# Patient Record
Sex: Female | Born: 1940 | Race: White | Hispanic: No | Marital: Married | Smoking: Former smoker
Health system: Southern US, Community
[De-identification: ages and names within clinical notes are randomized; demographics above are authoritative.]

## PROBLEM LIST (undated history)

## (undated) DIAGNOSIS — E785 Hyperlipidemia, unspecified: Secondary | ICD-10-CM

## (undated) DIAGNOSIS — K219 Gastro-esophageal reflux disease without esophagitis: Secondary | ICD-10-CM

## (undated) DIAGNOSIS — Z8551 Personal history of malignant neoplasm of bladder: Secondary | ICD-10-CM

## (undated) DIAGNOSIS — M6281 Muscle weakness (generalized): Secondary | ICD-10-CM

## (undated) DIAGNOSIS — N289 Disorder of kidney and ureter, unspecified: Secondary | ICD-10-CM

## (undated) DIAGNOSIS — I509 Heart failure, unspecified: Secondary | ICD-10-CM

## (undated) DIAGNOSIS — E119 Type 2 diabetes mellitus without complications: Secondary | ICD-10-CM

## (undated) DIAGNOSIS — C689 Malignant neoplasm of urinary organ, unspecified: Secondary | ICD-10-CM

## (undated) DIAGNOSIS — M1990 Unspecified osteoarthritis, unspecified site: Secondary | ICD-10-CM

## (undated) DIAGNOSIS — IMO0001 Reserved for inherently not codable concepts without codable children: Secondary | ICD-10-CM

## (undated) DIAGNOSIS — E079 Disorder of thyroid, unspecified: Secondary | ICD-10-CM

## (undated) DIAGNOSIS — I10 Essential (primary) hypertension: Secondary | ICD-10-CM

## (undated) DIAGNOSIS — N390 Urinary tract infection, site not specified: Secondary | ICD-10-CM

## (undated) DIAGNOSIS — R0602 Shortness of breath: Secondary | ICD-10-CM

## (undated) DIAGNOSIS — C3490 Malignant neoplasm of unspecified part of unspecified bronchus or lung: Secondary | ICD-10-CM

## (undated) DIAGNOSIS — Z9289 Personal history of other medical treatment: Secondary | ICD-10-CM

## (undated) DIAGNOSIS — Z9989 Dependence on other enabling machines and devices: Secondary | ICD-10-CM

## (undated) DIAGNOSIS — G4733 Obstructive sleep apnea (adult) (pediatric): Secondary | ICD-10-CM

## (undated) DIAGNOSIS — Z87442 Personal history of urinary calculi: Secondary | ICD-10-CM

## (undated) DIAGNOSIS — D0370 Melanoma in situ of unspecified lower limb, including hip: Secondary | ICD-10-CM

## (undated) DIAGNOSIS — R011 Cardiac murmur, unspecified: Secondary | ICD-10-CM

## (undated) DIAGNOSIS — E669 Obesity, unspecified: Secondary | ICD-10-CM

## (undated) DIAGNOSIS — I4891 Unspecified atrial fibrillation: Secondary | ICD-10-CM

## (undated) DIAGNOSIS — M8580 Other specified disorders of bone density and structure, unspecified site: Secondary | ICD-10-CM

## (undated) HISTORY — PX: FRACTURE SURGERY: SHX138

## (undated) HISTORY — PX: COLONOSCOPY: SHX174

## (undated) HISTORY — DX: Other specified disorders of bone density and structure, unspecified site: M85.80

## (undated) HISTORY — PX: TUBAL LIGATION: SHX77

## (undated) HISTORY — DX: Melanoma in situ of unspecified lower limb, including hip: D03.70

## (undated) HISTORY — DX: Unspecified atrial fibrillation: I48.91

## (undated) HISTORY — PX: CATARACT EXTRACTION W/ INTRAOCULAR LENS  IMPLANT, BILATERAL: SHX1307

## (undated) HISTORY — PX: DILATION AND CURETTAGE OF UTERUS: SHX78

## (undated) HISTORY — DX: Malignant neoplasm of urinary organ, unspecified (CMS HCC): C68.9

## (undated) HISTORY — DX: Personal history of malignant neoplasm of bladder: Z85.51

## (undated) HISTORY — PX: HX NEPHRECTOMY: SHX65

## (undated) HISTORY — DX: Essential (primary) hypertension: I10

## (undated) HISTORY — PX: HX HYSTERECTOMY: SHX81

## (undated) HISTORY — DX: Reserved for inherently not codable concepts without codable children: IMO0001

## (undated) HISTORY — DX: Malignant neoplasm of unspecified part of unspecified bronchus or lung (CMS HCC): C34.90

## (undated) HISTORY — DX: Type 2 diabetes mellitus without complications (CMS HCC): E11.9

## (undated) HISTORY — PX: HX LASER EYE SURGERY: 2100001140

## (undated) HISTORY — PX: HX OTHER: 2100001105

## (undated) HISTORY — PX: HX GALL BLADDER SURGERY/CHOLE: SHX55

## (undated) HISTORY — DX: Unspecified osteoarthritis, unspecified site: M19.90

## (undated) HISTORY — PX: HX APPENDECTOMY: SHX54

## (undated) HISTORY — PX: HX FOOT SURGERY: 2100001154

## (undated) HISTORY — PX: HX TONSILLECTOMY: SHX27

## (undated) HISTORY — PX: HX HAND SURGERY: 2100001299

---
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
## 1994-07-02 ENCOUNTER — Ambulatory Visit (HOSPITAL_COMMUNITY): Payer: Self-pay

## 1995-11-03 ENCOUNTER — Other Ambulatory Visit: Payer: Self-pay

## 1999-09-27 ENCOUNTER — Encounter (FREE_STANDING_LABORATORY_FACILITY): Payer: Self-pay

## 2007-05-04 ENCOUNTER — Emergency Department (EMERGENCY_DEPARTMENT_HOSPITAL): Admission: EM | Admit: 2007-05-04 | Discharge: 2007-05-04 | Payer: Medicare Other

## 2008-01-21 ENCOUNTER — Other Ambulatory Visit: Payer: Self-pay | Admitting: UROLOGY

## 2008-03-03 ENCOUNTER — Other Ambulatory Visit: Payer: Self-pay | Admitting: UROLOGY

## 2008-10-20 HISTORY — PX: MELANOMA EXCISION: SHX5266

## 2008-11-25 ENCOUNTER — Inpatient Hospital Stay (HOSPITAL_BASED_OUTPATIENT_CLINIC_OR_DEPARTMENT_OTHER)
Admission: EM | Admit: 2008-11-25 | Discharge: 2008-11-25 | Disposition: A | Discharge status: Home / Self Care | Payer: Medicare Other

## 2009-01-01 ENCOUNTER — Inpatient Hospital Stay (HOSPITAL_BASED_OUTPATIENT_CLINIC_OR_DEPARTMENT_OTHER)
Admission: EM | Admit: 2009-01-01 | Discharge: 2009-01-01 | Disposition: A | Discharge status: Home / Self Care | Payer: Medicare Other

## 2009-03-09 ENCOUNTER — Encounter (INDEPENDENT_AMBULATORY_CARE_PROVIDER_SITE_OTHER): Payer: Self-pay | Admitting: *Deleted

## 2009-04-08 ENCOUNTER — Inpatient Hospital Stay (HOSPITAL_BASED_OUTPATIENT_CLINIC_OR_DEPARTMENT_OTHER)
Admission: EM | Admit: 2009-04-08 | Discharge: 2009-04-08 | Disposition: A | Discharge status: Home / Self Care | Payer: Medicare Other

## 2009-04-17 ENCOUNTER — Ambulatory Visit: Payer: Self-pay | Admitting: Internal Medicine

## 2009-04-30 ENCOUNTER — Ambulatory Visit: Payer: Self-pay | Admitting: Internal Medicine

## 2009-04-30 ENCOUNTER — Encounter: Payer: Self-pay | Admitting: Internal Medicine

## 2009-05-01 ENCOUNTER — Encounter: Payer: Self-pay | Admitting: Internal Medicine

## 2009-06-12 ENCOUNTER — Inpatient Hospital Stay (HOSPITAL_BASED_OUTPATIENT_CLINIC_OR_DEPARTMENT_OTHER)
Admission: EM | Admit: 2009-06-12 | Discharge: 2009-06-12 | Disposition: A | Discharge status: Home / Self Care | Payer: Medicare Other

## 2009-07-06 ENCOUNTER — Inpatient Hospital Stay (HOSPITAL_BASED_OUTPATIENT_CLINIC_OR_DEPARTMENT_OTHER)
Admission: EM | Admit: 2009-07-06 | Discharge: 2009-07-06 | Disposition: A | Discharge status: Home / Self Care | Payer: Medicare Other

## 2009-07-10 DIAGNOSIS — E1169 Type 2 diabetes mellitus with other specified complication: Secondary | ICD-10-CM

## 2009-08-15 ENCOUNTER — Other Ambulatory Visit: Payer: Self-pay

## 2009-08-24 ENCOUNTER — Inpatient Hospital Stay (HOSPITAL_BASED_OUTPATIENT_CLINIC_OR_DEPARTMENT_OTHER)
Admission: EM | Admit: 2009-08-24 | Discharge: 2009-08-24 | Disposition: A | Discharge status: Home / Self Care | Payer: Medicare Other

## 2009-10-20 HISTORY — PX: WRIST FRACTURE SURGERY: SHX121

## 2009-11-09 ENCOUNTER — Inpatient Hospital Stay (HOSPITAL_BASED_OUTPATIENT_CLINIC_OR_DEPARTMENT_OTHER)
Admission: EM | Admit: 2009-11-09 | Discharge: 2009-11-09 | Disposition: A | Discharge status: Home / Self Care | Payer: Medicare Other

## 2009-12-14 ENCOUNTER — Ambulatory Visit (HOSPITAL_COMMUNITY): Admission: RE | Admit: 2009-12-14 | Discharge: 2009-12-16 | Payer: Self-pay | Admitting: Orthopaedic Surgery

## 2010-01-18 ENCOUNTER — Encounter (INDEPENDENT_AMBULATORY_CARE_PROVIDER_SITE_OTHER): Payer: Medicare Other | Admitting: Urology

## 2010-02-13 ENCOUNTER — Inpatient Hospital Stay (HOSPITAL_BASED_OUTPATIENT_CLINIC_OR_DEPARTMENT_OTHER)
Admission: EM | Admit: 2010-02-13 | Discharge: 2010-02-13 | Disposition: A | Discharge status: Home / Self Care | Payer: Medicare Other

## 2010-06-22 ENCOUNTER — Inpatient Hospital Stay (HOSPITAL_BASED_OUTPATIENT_CLINIC_OR_DEPARTMENT_OTHER)
Admission: EM | Admit: 2010-06-22 | Discharge: 2010-06-22 | Disposition: A | Discharge status: Home / Self Care | Payer: Medicare Other

## 2010-06-30 ENCOUNTER — Inpatient Hospital Stay (HOSPITAL_BASED_OUTPATIENT_CLINIC_OR_DEPARTMENT_OTHER)
Admission: EM | Admit: 2010-06-30 | Discharge: 2010-06-30 | Disposition: A | Discharge status: Home / Self Care | Payer: Medicare Other

## 2010-07-09 ENCOUNTER — Inpatient Hospital Stay (HOSPITAL_BASED_OUTPATIENT_CLINIC_OR_DEPARTMENT_OTHER)
Admission: EM | Admit: 2010-07-09 | Discharge: 2010-07-09 | Disposition: A | Discharge status: Home / Self Care | Payer: Medicare Other

## 2010-09-23 ENCOUNTER — Emergency Department (HOSPITAL_COMMUNITY)
Admission: EM | Admit: 2010-09-23 | Discharge: 2010-09-23 | Payer: Self-pay | Source: Home / Self Care | Admitting: Emergency Medicine

## 2010-10-03 ENCOUNTER — Other Ambulatory Visit: Payer: Self-pay

## 2010-10-07 ENCOUNTER — Inpatient Hospital Stay (HOSPITAL_BASED_OUTPATIENT_CLINIC_OR_DEPARTMENT_OTHER)
Admission: EM | Admit: 2010-10-07 | Discharge: 2010-10-07 | Disposition: A | Discharge status: Home / Self Care | Payer: Medicare Other

## 2010-11-15 ENCOUNTER — Inpatient Hospital Stay (HOSPITAL_BASED_OUTPATIENT_CLINIC_OR_DEPARTMENT_OTHER)
Admission: EM | Admit: 2010-11-15 | Discharge: 2010-11-15 | Disposition: A | Discharge status: Home / Self Care | Payer: Medicare Other

## 2010-11-29 ENCOUNTER — Ambulatory Visit: Payer: Medicare Other | Attending: Urology | Admitting: Urology

## 2010-11-29 ENCOUNTER — Encounter (INDEPENDENT_AMBULATORY_CARE_PROVIDER_SITE_OTHER): Payer: Self-pay | Admitting: Urology

## 2010-11-29 VITALS — BP 132/70 | Temp 99.0°F | Wt 195.0 lb

## 2010-11-29 DIAGNOSIS — C679 Malignant neoplasm of bladder, unspecified: Secondary | ICD-10-CM | POA: Insufficient documentation

## 2010-11-29 NOTE — H&P (Signed)
Urology Outpatient History and Physical  [facility]    [facility]  [id number]  11/29/2010    CC:  Bladder cancer, evaluation for cystectomy    Subjective:  [facility] reports to urology clinic as a new patient.  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  She is s/p left nephroureterectomy performed by Dr. Bethann [facility] about 4 years ago.  He became ill and retired soon after surgery.  She underwent a cystoscopy by Dr[facility] Donzetta [facility] at [facility] on 11/14/10, and he found another tumor.  She was sent here for evaluation for cystectomy.  She states that she has a hole in her bladder that connects to her vagina.  She also has a "plastic tube" that she urinates from.  She denies dysuria, urinary frequency, or grossly bloody urine.    Past Medical History   Diagnosis Date   . Hypertension    . Ulcer disease    . Anemia    . Lung cancer      left lung, unknown type   . Arthritis    . Transitional cell carcinoma    . Diabetes mellitus        Past Surgical History   Procedure Date   . Hx hysterectomy    . Hx hand surgery      bilateral   . Hx foot surgery      right   . Hx other      left arm surgery   . Hx nephrectomy      with ureterectomy   . Hx other      left lung cancer removal       Current outpatient prescriptions   Medication Sig   . Simvastatin (ZOCOR) 40 mg Oral Tablet take 40 mg by mouth QPM.   . INSULIN LISPRO (HUMALOG SUBQ) 8 Units by Subcutaneous route Twice daily.   . enalapril (VASOTEC) 2.5 mg Oral Tablet take 2.5 mg by mouth Once a day.   Marland Kitchen NIACIN, INOSITOL NIACINATE, ORAL take  by mouth.   . Omeprazole (PRILOSEC) 40 mg Oral Capsule, Delayed Release(E.C.) take 20 mg by mouth Once a day.   . verapamil 240 mg Oral Tablet Sustained Release take 240 mg by mouth Once a day.   . sucralfate (CARAFATE) 1 gram Oral Tablet take 1 g by mouth Four times a day - before meals and bedtime.    . citalopram (CELEXA) 40 mg Oral Tablet take 40 mg by mouth Once a day.   . Hydrocodone-Acetaminophen (LORTAB) 2.5-500 mg Oral Tablet tablet take 1 Tab by mouth Every 6 hours as needed.   . potassium chloride (KLOR-CON) 20 mEq Oral Packet take 20 mEq by mouth Daily with Breakfast.   . OXYBUTYNIN CHLORIDE (DITROPAN XL ORAL) take 20 mg by mouth Once a day.   . INSULIN GLARGINE,HUM.REC.ANLOG (LANTUS SUBQ) 30 Units by Subcutaneous route Once a day.       No Known Allergies    History   Social History   . Marital Status: Married     Spouse Name: N/A     Number of Children: N/A   . Years of Education: N/A   Occupational History   . Homemaker    Social History Main Topics   . Smoking status: Former Games developer   . Smokeless tobacco: Not on file    Comment: Pt quit smoking over 40 years   . Alcohol Use: Yes  wine rarely   . Drug Use: Not on file   . Sexually Active: Not on file   Other Topics Concern   . Not on file   Social History Narrative   . No narrative on file       Family History   Problem Relation Age of Onset   . Diabetes Mother    . Diabetes Father    . Hypertension Mother    . Hypertension Father    . Coronary Artery Disease Mother    . Lung Cancer Sister      smoker       Review of Systems:  Urologic ROS per HPI.  Positive for reflux; arthritis pain of hands, left wrist, right foot, left hip; back pain.  10-point ROS otherwise negative.    Objective:  BP 132/70   Temp(Src) 37.2 C (99 F) (Tympanic)   Wt 88.451 kg (195 lb)    General:  NAD, appears stated age.  HEENT:  NC/AT.  No scleral icterus.  Neck:  Supple.  Heart:  RRR without murmur, rub, or gallop.  Lungs:  CTAB without crackles, wheezes, rales, or rhonchi.  Abdomen:  Soft, nontender, nondistended, normoactive bowel sounds.  Skin:  Warm and dry.  Neurologic:  Alert and oriented x3.  Psychiatric:  Normal speech and thought content.    Patient did not provide a urine sample today.  Outside records reviewed with [facility].     Pathology report from [facility] of deep tissue biopsy on 11/14/10 shows high grade urothelial carcinoma, invasive.  Muscularis propria uninvolved.  Angiolymphatic invasion present.  Left and right wall biopsies show chronic inflammation.    Assessment/Plan:  70 y.o. female with h/o TCC s/p nephroureterectomy presents with recurrent bladder cancer.  We discussed the need for radical cystectomy with urinary diversion.  The procedure was explained to the patient, and she is willing to proceed but will require surgical clearance.  We advised her to follow up with her PCP, [facility], for this.  She will be scheduled for surgery in approximately 4 weeks.        Christy Deeds, MD  Resident  [place] [facility]  Operated by [facility]    Patient seen jointly with [facility], who formulated the above management plan.

## 2010-12-03 NOTE — Progress Notes (Signed)
I discussed with the patient the need for radical cystectomy for her bladder cancer. I told her that surgery represent the best chance for his cure. I discussed the role of neoadjuvant chemotherapy in the treatment of muscle invasive bladder cancer. I told her the alternative treatment is chemo/radiation. I discussed the benefits and the risk of each approaches. I discussed the different type urinary diversion. I told her about continent and non continent diversion. Orthotopic neobladder was offered to the patient. The advantages and the disadvantages of each type of diversion were addressed. The pre and post surgical course were described to the patient.   25 min were spent with the patient out of the 45 min visit.

## 2010-12-20 ENCOUNTER — Encounter (HOSPITAL_COMMUNITY): Payer: Self-pay

## 2010-12-20 ENCOUNTER — Ambulatory Visit
Admission: RE | Admit: 2010-12-20 | Discharge: 2010-12-20 | Disposition: A | Discharge status: Home / Self Care | Payer: Medicare Other | Source: Ambulatory Visit | Attending: Urology | Admitting: Urology

## 2010-12-20 ENCOUNTER — Ambulatory Visit (INDEPENDENT_AMBULATORY_CARE_PROVIDER_SITE_OTHER): Payer: Medicare Other | Admitting: Urology

## 2010-12-20 ENCOUNTER — Ambulatory Visit (HOSPITAL_BASED_OUTPATIENT_CLINIC_OR_DEPARTMENT_OTHER)
Admission: RE | Admit: 2010-12-20 | Discharge: 2010-12-20 | Disposition: A | Discharge status: Home / Self Care | Payer: Medicare Other | Source: Ambulatory Visit | Attending: Urology | Admitting: Urology

## 2010-12-20 DIAGNOSIS — D494 Neoplasm of unspecified behavior of bladder: Secondary | ICD-10-CM | POA: Insufficient documentation

## 2010-12-20 DIAGNOSIS — Z01818 Encounter for other preprocedural examination: Secondary | ICD-10-CM | POA: Insufficient documentation

## 2010-12-20 HISTORY — DX: Personal history of other medical treatment: Z92.89

## 2010-12-20 HISTORY — DX: Hyperlipidemia, unspecified: E78.5

## 2010-12-20 HISTORY — DX: Gastro-esophageal reflux disease without esophagitis: K21.9

## 2010-12-20 HISTORY — DX: Disorder of kidney and ureter, unspecified: N28.9

## 2010-12-20 LAB — CBC
HCT: 36.1 % (ref 33.5–45.2)
HGB: 12.1 g/dL (ref 11.5–15.2)
MCH: 29 pg (ref 27.4–33.0)
MCHC: 33.4 g/dL (ref 31.6–35.5)
MCV: 86.7 fL (ref 82.0–99.0)
MPV: 7.3 FL — ABNORMAL LOW (ref 7.4–10.4)
PLATELET COUNT: 272 THOU/uL (ref 140–450)
RBC: 4.17 MIL/uL (ref 3.84–5.04)
RDW: 12.7 % (ref 10.2–14.0)
WBC: 10.5 THOU/uL (ref 3.5–11.0)

## 2010-12-20 LAB — CREATININE WITH EGFR: ESTIMATED GLOMERULAR FILTRATION RATE: 41 ml/min/1.73m2 — ABNORMAL LOW (ref >59)

## 2010-12-20 LAB — PT/INR
INR: 0.9 (ref 0.8–1.2)
PROTHROMBIN TIME: 10.1 s (ref 9.1–11.5)

## 2010-12-20 LAB — ELECTROLYTES
ANION GAP: 7 mmol/L (ref 5–16)
CARBON DIOXIDE: 27 mmol/L (ref 22–32)
CHLORIDE: 104 mmol/L (ref 96–111)
POTASSIUM: 4.7 mmol/L (ref 3.5–5.1)
SODIUM: 138 mmol/L (ref 136–145)

## 2010-12-20 LAB — PERFORM POC WHOLE BLOOD GLUCOSE: GLUCOSE, POINT OF CARE: 132 mg/dL — ABNORMAL HIGH (ref 70–105)

## 2010-12-20 LAB — BUN
BUN/CREAT RATIO: 23 — ABNORMAL HIGH (ref 6–22)
BUN: 29 mg/dL — ABNORMAL HIGH (ref 6–20)

## 2010-12-20 LAB — PTT (PARTIAL THROMBOPLASTIN TIME): APTT: 23.4 s (ref 22.0–32.0)

## 2010-12-20 MED ORDER — PEG-ELECTROLYTE ORAL SOLUTION
2.0000 L | Freq: Once | ORAL | Status: AC
Start: 2010-12-20 — End: 2010-12-20

## 2010-12-20 NOTE — H&P (Signed)
UROLOGY CLINIC  HISTORY & PHYSICAL    Name: [facility]  MRN: [id number]  Date of Birth: 07-18-1941  Date of Consultation: 12/20/2010    Chief Complaint: Chief Complaint   Patient presents with   . F/U     consent and orders         HPI: [facility] is a 70 y.o. 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  S/p left nephroureterectomy performed by Dr. Bethann Punches about 4 years ago.  Dr. Bethann Punches became ill and retired soon after surgery.  She underwent a cystoscopy by Dr. Donzetta Kohut at [facility] on 11/14/10, and he found another bladder tumor.  Pathology from that procedure revealed muscle invasive bladder cancer.  She has met with Dr. Marzetta Board several times to discuss treatment options.  She returns today for C&O and PAT for cystectomy scheduled on 12/31/2010.     Pathology report from deep tissue biopsy on 11/14/10 shows high grade urothelial carcinoma, invasive. Muscularis propria uninvolved. Angiolymphatic invasion present. Left and right wall biopsies show chronic inflammation.    PMHx: Past Medical History   Diagnosis Date   . Hypertension    . Ulcer disease    . Anemia    . Lung cancer      left lung, unknown type   . Arthritis    . Transitional cell carcinoma    . Diabetes mellitus          PSHx: Past Surgical History   Procedure Date   . Hx hysterectomy    . Hx hand surgery      bilateral   . Hx foot surgery      right   . Hx other      left arm surgery   . Hx nephrectomy      with ureterectomy   . Hx other      left lung cancer removal         Family Hx: Denies family history of bladder, or kidney cancer.     Social Hx: History   Social History   . Marital Status: Married     Spouse Name: N/A     Number of Children: N/A   . Years of Education: N/A   Occupational History   . Homemaker    Social History Main Topics   . Smoking status: Former Games developer   . Smokeless tobacco: Not on file    Comment: Pt quit smoking over 40 years   . Alcohol Use: Yes       wine rarely   . Drug Use: Not on file   . Sexually Active: Not on file   Other Topics Concern   . Not on file   Social History Narrative   . No narrative on file         Medications: Current outpatient prescriptions   Medication Sig   . PEG-Electrolyte Soln (GOLYTELY) Oral Recon Soln take 2,000 mL by mouth Once for 1 dose. At noon the day before surgery. Only fluids afterwards.   . Simvastatin (ZOCOR) 40 mg Oral Tablet take 40 mg by mouth QPM.   . INSULIN LISPRO (HUMALOG SUBQ) 8 Units by Subcutaneous route Twice daily.   . enalapril (VASOTEC) 2.5 mg Oral Tablet take 2.5 mg by mouth Once a day.   Marland Kitchen NIACIN, INOSITOL NIACINATE, ORAL take  by mouth.   . Omeprazole (PRILOSEC) 40 mg Oral Capsule, Delayed Release(E.C.) take 20 mg by mouth Once  a day.   . verapamil 240 mg Oral Tablet Sustained Release take 240 mg by mouth Once a day.   . sucralfate (CARAFATE) 1 gram Oral Tablet take 1 g by mouth Four times a day - before meals and bedtime.   . citalopram (CELEXA) 40 mg Oral Tablet take 40 mg by mouth Once a day.   . Hydrocodone-Acetaminophen (LORTAB) 2.5-500 mg Oral Tablet tablet take 1 Tab by mouth Every 6 hours as needed.   . potassium chloride (KLOR-CON) 20 mEq Oral Packet take 20 mEq by mouth Daily with Breakfast.   . OXYBUTYNIN CHLORIDE (DITROPAN XL ORAL) take 20 mg by mouth Once a day.   . INSULIN GLARGINE,HUM.REC.ANLOG (LANTUS SUBQ) 30 Units by Subcutaneous route Once a day.         Allergies: No Known Allergies      ROS:  All 10 systems were reviewed. Positive for GU symptoms listed in HPI.  Denies fever/chills, chest pain, SOB, N/V/D.     Physical Exam:  BP 100/70   Temp 36.3 C (97.4 F)   Ht 1.575 m (5\' 2" )   Wt 88.905 kg (196 lb)   BMI 35.85 kg/m2  General - alert/oriented; NAD  Lungs - non labored respirations  Abdomen - soft, NT/ND; no CVAT  Musculoskeletal - ambulates without assistance  Neurological - CN II-XII Grossly intact  GU system - deferred    Urine Dip Results:  Urine Dip Results:                 Assessment:  70 y.o. female with 1. Muscle invasive bladder cancer  2. H/o TCC of left renal pelvis    Orders Placed This Encounter   . XR CHEST PA AND LATERAL   . BUN   . CBC   . CREATININE   . ELECTROLYTES   . PT/INR   . ROUTINE PTT   . ECG 12-LEAD (PERFORMED IN PREADMISSION UNIT ONLY)   . PEG-Electrolyte Soln (GOLYTELY) Oral Recon Soln       Plan:  Consent obtained today for cystectomy with ileal conduit on 12/31/2010.  All of the patient's questions were answered.  All the risks and benefits of the procedure were discussed in detail with the patient, and she signed the consent in the office today.      The patient understands the risks of the procedure, which would include bleeding, infection/sepsis, recurrence, pain, urine leak, damage to the surrounding structures, failure to correct pathology, loss of sensation, heart attack, stroke, DVT, PE, and death.     The patient was advised to stop all NSAIDs and aspirin for at least 7-10 days and stop Plavix and Coumadin for 5-7 days respectively.    Patient was given a prescription with instructions for her bowel prep 1 day prior to OR: Golytely 4L between 1-4pm, Neomycin 1g PO @ 2pm 4pm 6pm, Erythromycin 1g PO @ 2pm 4pm 6pm, and Fleet enemas 2 PR @ 7pm.     Patient will undergo PAT today.    If the patient or their family has any questions, they can call our office prior to their procedure.      Christy Dickinson, PA-C  Physician Assistant -Certified  Division of Urology  [facility] Department of Surgery

## 2010-12-20 NOTE — Anesthesia Preprocedure Evaluation (Addendum)
Airway       Mallampati: III    TM distance: >3 FB    Neck ROM: full  Mouth Opening: fair.  No endotracheal tube present  No Tracheostomy present  Dental           (+) edentulous           Pulmonary    Breath sounds clear to auscultation  (-) no rhonchi, no decreased breath sounds, no wheezes, no rales and no stridor   Cardiovascular    Rhythm: regular  Rate: Normal  (-) no friction rub, carotid bruit is not present, no peripheral edema and no murmur     Other findings          Anesthesia type: general    ASA 3        Patient's NPO status is appropriate for Anesthesia.      Anesthetic plan and risks discussed with patient.    Anesthesia issues/risks discussed are: Post-op Intubation/Ventilation, Post-op Pain Management, Art Line Placement, Central Line Placement, Intraoperative Awareness/ Recall, PONV, Cardiac Events/MI and Stroke.      Use of blood products discussed with patient.     Possibility of needing postop ventilation and ICU discussed with patient.  AQA.    The Anesthesia Plan above was formulated based on the history, results, and physical exam performed immediately prior to the procedure/surgery.        EKG Ordered:  12/20/2010   CXR Ordered:  12/20/2010   Other Studies: labs ordered 12/20/10  Consults: None    Patient instructed to take omeprazole, citalapram, verapamil, hydrocodone, oxybutynin. Hold enalapril for 24 hours prior to surgery. Instructed to take 1/2 dose of lantus the night prior to surgery and only cover fs above 200 the morning of surgery with humalog. Diabetic protocol given to pt

## 2010-12-23 ENCOUNTER — Other Ambulatory Visit (INDEPENDENT_AMBULATORY_CARE_PROVIDER_SITE_OTHER): Payer: Self-pay | Admitting: Urology

## 2010-12-23 MED ORDER — PEG-ELECTROLYTE ORAL SOLUTION
2.0000 L | Freq: Once | ORAL | Status: AC
Start: 2010-12-23 — End: 2010-12-23

## 2010-12-23 NOTE — Progress Notes (Signed)
I discussed with the patient the need for radical cystectomy for his bladder cancer. I told him that surgery represent the best chance for his cure. I discussed the role of neoadjuvant chemotherapy in the treatment of muscle invasive bladder cancer. I told him the alternative treatment is chemo/radiation. I discussed the benefits and the risk of each approaches. I discussed the different type urinary diversion. I told him about continent and non continent diversion. Orthotopic neobladder was offered to the patient. The advantages and the disadvantages of each type of diversion were addressed. The pre and post surgical course were described to the patient. I discussed  the potential complications, like blood loss, infection, incontinence, MI, PE, DVT, Blindness, complication due to positioning,  failure to remove the prostate, MI and Stroke.  25 min min were spent with the patient out of the 45 min visit

## 2010-12-23 NOTE — Progress Notes (Signed)
 Name: [facility]  MRN: [id number]  Date of Birth: 01/25/1941  Date of Consultation: 12/23/2010    Patient called to report she lost her Golytely  prescription.  E-prescribed her another prescription to [facility] pharmacy.    Orders Placed This Encounter   . PEG-Electrolyte Soln (GOLYTELY ) Oral Recon Soln       Kamare Caspers McClure Deryn Massengale, PA-C 12/23/2010, 1:25 PM

## 2010-12-30 MED ORDER — SODIUM CHLORIDE 0.9 % INTRAVENOUS SOLUTION
2.00 g | Freq: Once | INTRAVENOUS | Status: DC
Start: 2010-12-31 — End: 2010-12-31
  Filled 2010-12-30: qty 20

## 2010-12-30 MED ORDER — CEFAZOLIN 10 GRAM SOLUTION FOR INJECTION
2.00 g | Freq: Once | INTRAMUSCULAR | Status: DC
Start: 2010-12-31 — End: 2010-12-31
  Filled 2010-12-30: qty 20

## 2010-12-31 ENCOUNTER — Inpatient Hospital Stay (HOSPITAL_COMMUNITY): Payer: Medicare Other

## 2010-12-31 ENCOUNTER — Encounter (HOSPITAL_COMMUNITY)
Admission: RE | Disposition: A | Discharge status: Nursing Home (Includes ICF) | Payer: Self-pay | Source: Ambulatory Visit | Attending: Urology

## 2010-12-31 ENCOUNTER — Inpatient Hospital Stay
Admission: RE | Admit: 2010-12-31 | Discharge: 2011-01-07 | DRG: 653 | Disposition: A | Discharge status: Skilled Nursing Facility | Payer: Medicare Other | Source: Ambulatory Visit | Attending: Urology | Admitting: Urology

## 2010-12-31 ENCOUNTER — Encounter (HOSPITAL_COMMUNITY): Payer: Self-pay | Admitting: Family

## 2010-12-31 ENCOUNTER — Inpatient Hospital Stay (HOSPITAL_COMMUNITY): Payer: Medicare Other | Admitting: Urology

## 2010-12-31 ENCOUNTER — Inpatient Hospital Stay (HOSPITAL_COMMUNITY): Payer: Medicare Other | Admitting: Anesthesiology-BA

## 2010-12-31 ENCOUNTER — Encounter (HOSPITAL_COMMUNITY): Payer: Self-pay

## 2010-12-31 DIAGNOSIS — Z85118 Personal history of other malignant neoplasm of bronchus and lung: Secondary | ICD-10-CM

## 2010-12-31 DIAGNOSIS — D649 Anemia, unspecified: Secondary | ICD-10-CM | POA: Diagnosis present

## 2010-12-31 DIAGNOSIS — E785 Hyperlipidemia, unspecified: Secondary | ICD-10-CM | POA: Diagnosis present

## 2010-12-31 DIAGNOSIS — N1330 Unspecified hydronephrosis: Secondary | ICD-10-CM | POA: Diagnosis not present

## 2010-12-31 DIAGNOSIS — K277 Chronic peptic ulcer, site unspecified, without hemorrhage or perforation: Secondary | ICD-10-CM | POA: Diagnosis present

## 2010-12-31 DIAGNOSIS — E876 Hypokalemia: Secondary | ICD-10-CM | POA: Diagnosis not present

## 2010-12-31 DIAGNOSIS — Z452 Encounter for adjustment and management of vascular access device: Secondary | ICD-10-CM

## 2010-12-31 DIAGNOSIS — R34 Anuria and oliguria: Secondary | ICD-10-CM | POA: Diagnosis not present

## 2010-12-31 DIAGNOSIS — IMO0002 Reserved for concepts with insufficient information to code with codable children: Secondary | ICD-10-CM | POA: Diagnosis not present

## 2010-12-31 DIAGNOSIS — E872 Acidosis, unspecified: Secondary | ICD-10-CM | POA: Diagnosis not present

## 2010-12-31 DIAGNOSIS — M129 Arthropathy, unspecified: Secondary | ICD-10-CM | POA: Diagnosis present

## 2010-12-31 DIAGNOSIS — J95821 Acute postprocedural respiratory failure: Secondary | ICD-10-CM | POA: Diagnosis not present

## 2010-12-31 DIAGNOSIS — Z794 Long term (current) use of insulin: Secondary | ICD-10-CM

## 2010-12-31 DIAGNOSIS — Z905 Acquired absence of kidney: Secondary | ICD-10-CM

## 2010-12-31 DIAGNOSIS — I10 Essential (primary) hypertension: Secondary | ICD-10-CM | POA: Diagnosis present

## 2010-12-31 DIAGNOSIS — K219 Gastro-esophageal reflux disease without esophagitis: Secondary | ICD-10-CM | POA: Diagnosis present

## 2010-12-31 DIAGNOSIS — C679 Malignant neoplasm of bladder, unspecified: Principal | ICD-10-CM | POA: Diagnosis present

## 2010-12-31 DIAGNOSIS — Z8553 Personal history of malignant neoplasm of renal pelvis: Secondary | ICD-10-CM

## 2010-12-31 DIAGNOSIS — I9589 Other hypotension: Secondary | ICD-10-CM | POA: Diagnosis not present

## 2010-12-31 DIAGNOSIS — N170 Acute kidney failure with tubular necrosis: Secondary | ICD-10-CM | POA: Diagnosis not present

## 2010-12-31 DIAGNOSIS — Z87891 Personal history of nicotine dependence: Secondary | ICD-10-CM

## 2010-12-31 DIAGNOSIS — E861 Hypovolemia: Secondary | ICD-10-CM | POA: Diagnosis not present

## 2010-12-31 DIAGNOSIS — E119 Type 2 diabetes mellitus without complications: Secondary | ICD-10-CM | POA: Diagnosis present

## 2010-12-31 HISTORY — PX: HX CYSTECTOMY: SHX74

## 2010-12-31 LAB — BASIC METABOLIC PANEL
ANION GAP: 8 mmol/L (ref 5–16)
ANION GAP: 11 mmol/L (ref 5–16)
BUN/CREAT RATIO: 16 (ref 6–22)
BUN/CREAT RATIO: 14 (ref 6–22)
BUN: 23 mg/dL — ABNORMAL HIGH (ref 6–20)
BUN: 24 mg/dL — ABNORMAL HIGH (ref 6–20)
CALCIUM: 8.8 mg/dL (ref 8.5–10.4)
CALCIUM: 8.1 mg/dL — ABNORMAL LOW (ref 8.5–10.4)
CARBON DIOXIDE: 16 mmol/L — ABNORMAL LOW (ref 22–32)
CHLORIDE: 115 mmol/L — ABNORMAL HIGH (ref 96–111)
CHLORIDE: 111 mmol/L (ref 96–111)
CREATININE: 1.4 mg/dL — ABNORMAL HIGH (ref 0.49–1.10)
CREATININE: 1.66 mg/dL — ABNORMAL HIGH (ref 0.49–1.10)
ESTIMATED GLOMERULAR FILTRATION RATE: 37 ml/min/1.73m2 — ABNORMAL LOW (ref >59)
ESTIMATED GLOMERULAR FILTRATION RATE: 30 ml/min/1.73m2 — ABNORMAL LOW (ref >59)
GLUCOSE,NONFAST: 126 mg/dL (ref 65–139)
GLUCOSE,NONFAST: 150 mg/dL — ABNORMAL HIGH (ref 65–139)
POTASSIUM: 4.4 mmol/L (ref 3.5–5.1)
SODIUM: 139 mmol/L (ref 136–145)

## 2010-12-31 LAB — ARTERIAL BLOOD GAS/LACTATE/CO-OX/LYTES (NA/K/CA/CL/GLUC) (TEMP COMP) - ORS ONLY
%FIO2: 100 % (ref 21–100)
%FIO2: 60 % (ref 21–100)
%FIO2: 68 % (ref 21–100)
%FIO2: 68 % (ref 21–100)
%FIO2: 68 % (ref 21–100)
BASE DEFICIT: 5.1 mmol/L — ABNORMAL HIGH (ref 0.0–3.0)
BASE DEFICIT: 8.1 mmol/L — ABNORMAL HIGH (ref 0.0–3.0)
BASE DEFICIT: 9.3 mmol/L — ABNORMAL HIGH (ref 0.0–3.0)
BASE DEFICIT: 8.9 mmol/L — ABNORMAL HIGH (ref 0.0–3.0)
BICARBONATE: 21 mmol/L (ref 18.0–29.0)
BICARBONATE: 18.7 mmol/L (ref 18.0–29.0)
BICARBONATE: 17.7 mmol/L — ABNORMAL LOW (ref 18.0–29.0)
BICARBONATE: 18 mmol/L (ref 18.0–29.0)
BICARBONATE: 18.3 mmol/L (ref 18.0–29.0)
CARBOXYHEMOGLOBIN: 0 % (ref 0.0–2.5)
CARBOXYHEMOGLOBIN: 0 % (ref 0.0–2.5)
CARBOXYHEMOGLOBIN: 0.2 % (ref 0.0–2.5)
CARBOXYHEMOGLOBIN: 1.1 % (ref 0.0–2.5)
CARBOXYHEMOGLOBIN: 1.3 % (ref 0.0–2.5)
CHLORIDE: 109 mmol/L (ref 96–111)
CHLORIDE: 110 mmol/L (ref 96–111)
CHLORIDE: 111 mmol/L (ref 96–111)
CO2T: 40 mmHg (ref 33.1–43.1)
CO2T: 32 mmHg — ABNORMAL LOW (ref 33.1–43.1)
CO2T: 33 mmHg — ABNORMAL LOW (ref 33.1–43.1)
CO2T: 35 mmHg (ref 33.1–43.1)
GLUCOSE: 226 mg/dL — ABNORMAL HIGH (ref 70–105)
GLUCOSE: 205 mg/dL — ABNORMAL HIGH (ref 70–105)
GLUCOSE: 176 mg/dL — ABNORMAL HIGH (ref 70–105)
GLUCOSE: 159 mg/dL — ABNORMAL HIGH (ref 70–105)
HEMATOCRIT: 29 % — ABNORMAL LOW (ref 33.5–45.2)
HEMATOCRIT: 29 % — ABNORMAL LOW (ref 33.5–45.2)
HEMATOCRIT: 27 % — ABNORMAL LOW (ref 33.5–45.2)
HEMATOCRIT: 29 % — ABNORMAL LOW (ref 33.5–45.2)
HEMOGLOBIN: 9.5 g/dL — ABNORMAL LOW (ref 12.0–16.0)
HEMOGLOBIN: 9.6 g/dL — ABNORMAL LOW (ref 12.0–16.0)
HEMOGLOBIN: 9.1 g/dL — ABNORMAL LOW (ref 12.0–16.0)
HEMOGLOBIN: 9 g/dL — ABNORMAL LOW (ref 12.0–16.0)
HEMOGLOBIN: 9.6 g/dL — ABNORMAL LOW (ref 12.0–16.0)
IONIZED CALCIUM: 1.12 mmol/L — ABNORMAL LOW (ref 1.30–1.46)
IONIZED CALCIUM: 1.34 mmol/L (ref 1.30–1.46)
IONIZED CALCIUM: 1.2 mmol/L — ABNORMAL LOW (ref 1.30–1.46)
IONIZED CALCIUM: 1.27 mmol/L — ABNORMAL LOW (ref 1.30–1.46)
IONIZED CALCIUM: 1.4 mmol/L (ref 1.30–1.46)
LACTATE: 0.9 mmol/L (ref <1.3)
LACTATE: 2 mmol/L — ABNORMAL HIGH (ref <1.3)
LACTATE: 1.4 mmol/L — ABNORMAL HIGH (ref <1.3)
LACTATE: 1.6 mmol/L — ABNORMAL HIGH (ref <1.3)
LACTATE: 1.9 mmol/L — ABNORMAL HIGH (ref <1.3)
MET-HEMOGLOBIN: 0.5 % (ref 0.0–3.0)
MET-HEMOGLOBIN: 0.3 % (ref 0.0–3.0)
MET-HEMOGLOBIN: 0.9 % (ref 0.0–3.0)
MET-HEMOGLOBIN: 1.3 % (ref 0.0–3.0)
O2CT: 13.6 — ABNORMAL LOW (ref 15.7–21.6)
O2CT: 13.9 — ABNORMAL LOW (ref 15.7–21.6)
O2CT: 13.3 — ABNORMAL LOW (ref 15.7–21.6)
O2CT: 13.1 — ABNORMAL LOW (ref 15.7–21.6)
O2CT: 13.8 — ABNORMAL LOW (ref 15.7–21.6)
OXYHEMOGLOBIN: 97.2 % (ref 85.0–98.0)
OXYHEMOGLOBIN: 98.4 % — ABNORMAL HIGH (ref 85.0–98.0)
OXYHEMOGLOBIN: 98.3 % — ABNORMAL HIGH (ref 85.0–98.0)
OXYHEMOGLOBIN: 97.3 % (ref 85.0–98.0)
OXYHEMOGLOBIN: 96.6 % (ref 85.0–98.0)
PCO2: 40 mmHg (ref 33.1–43.1)
PCO2: 41 mmHg (ref 33.1–43.1)
PCO2: 33 mmHg — ABNORMAL LOW (ref 33.1–43.1)
PCO2: 34 mmHg (ref 33.1–43.1)
PCO2: 36 mmHg (ref 33.1–43.1)
PH: 7.32 — ABNORMAL LOW (ref 7.350–7.450)
PH: 7.26 — ABNORMAL LOW (ref 7.350–7.450)
PH: 7.3 — ABNORMAL LOW (ref 7.350–7.450)
PH: 7.3 — ABNORMAL LOW (ref 7.350–7.450)
PH: 7.29 — ABNORMAL LOW (ref 7.350–7.450)
PHT: 7.33 — CL (ref 7.350–7.450)
PHT: 7.27 — CL (ref 7.350–7.450)
PHT: 7.31 — CL (ref 7.350–7.450)
PHT: 7.31 — CL (ref 7.350–7.450)
PHT: 7.3 — CL (ref 7.350–7.450)
PIO2/FIO2 RATIO: 257 — ABNORMAL LOW (ref >300)
PIO2/FIO2 RATIO: 405 (ref >300)
PIO2/FIO2 RATIO: 425 (ref >300)
PIO2/FIO2 RATIO: 441 (ref >300)
PIO2/FIO2 RATIO: 416 (ref >300)
PO2: 257 mmHg — ABNORMAL HIGH (ref 72–100)
PO2: 243 mmHg — ABNORMAL HIGH (ref 72–100)
PO2: 289 mmHg — ABNORMAL HIGH (ref 72–100)
PO2: 300 mmHg — ABNORMAL HIGH (ref 72–100)
PO2: 283 mmHg — ABNORMAL HIGH (ref 72–100)
PO2T: 252 mmHg (ref 72–100)
PO2T: 240 mmHg (ref 72–100)
PO2T: 287 mmHg (ref 72–100)
PO2T: 297 mmHg (ref 72–100)
PO2T: 281 mmHg (ref 72–100)
SODIUM: 137 mmol/L (ref 136–145)
SODIUM: 135 mmol/L — ABNORMAL LOW (ref 136–145)
SODIUM: 137 mmol/L (ref 136–145)
TEMPERATURE, COMP: 36 C (ref 15.0–40.0)
TEMPERATURE, COMP: 36.4 C (ref 15.0–40.0)
TEMPERATURE, COMP: 36.5 C (ref 15.0–40.0)
TEMPERATURE, COMP: 36.4 C (ref 15.0–40.0)
TEMPERATURE, COMP: 36.5 C (ref 15.0–40.0)
WHOLE BLOOD K+: 3.8 mmol/L (ref 3.5–5.0)
WHOLE BLOOD K+: 4.7 mmol/L (ref 3.5–5.0)
WHOLE BLOOD K+: 4 mmol/L (ref 3.5–5.0)
WHOLE BLOOD K+: 4 mmol/L (ref 3.5–5.0)
WHOLE BLOOD K+: 4.1 mmol/L (ref 3.5–5.0)

## 2010-12-31 LAB — ARTERIAL BLOOD GAS/K/CA/CO-OX
%FIO2: 50 % (ref 21–100)
BASE DEFICIT: 10.5 mmol/L — ABNORMAL HIGH (ref 0.0–3.0)
BICARBONATE: 16.8 mmol/L — ABNORMAL LOW (ref 18.0–29.0)
HEMATOCRIT: 29 % — ABNORMAL LOW (ref 33.5–45.2)
HEMOGLOBIN: 9.6 g/dL — ABNORMAL LOW (ref 12.0–16.0)
IONIZED CALCIUM: 1.3 mmol/L (ref 1.30–1.46)
MET-HEMOGLOBIN: 0.3 % (ref 0.0–3.0)
O2CT: 13.8 — ABNORMAL LOW (ref 15.7–21.6)
OXYHEMOGLOBIN: 98 % (ref 85.0–98.0)
PCO2: 30 mm Hg — ABNORMAL LOW (ref 33.1–43.1)
PH: 7.3 — ABNORMAL LOW (ref 7.350–7.450)
PIO2/FIO2 RATIO: 432 (ref >300)
PO2: 216 mm Hg — ABNORMAL HIGH (ref 72–100)
WHOLE BLOOD K+: 4.3 mmol/L (ref 3.5–5.0)

## 2010-12-31 LAB — ARTERIAL BLOOD GAS/LACTATE/CO-OX/LYTES (NA/K/CA/CL/GLUC) - ORS ONLY
%FIO2: 30 % (ref 21–100)
%FIO2: 30 % (ref 21–100)
BASE DEFICIT: 6.4 mmol/L — ABNORMAL HIGH (ref 0.0–3.0)
BASE DEFICIT: 7.8 mmol/L — ABNORMAL HIGH (ref 0.0–3.0)
BICARBONATE: 20 mmol/L (ref 18.0–29.0)
BICARBONATE: 18.9 mmol/L (ref 18.0–29.0)
CARBOXYHEMOGLOBIN: 0.3 % (ref 0.0–2.5)
CARBOXYHEMOGLOBIN: 1 % (ref 0.0–2.5)
CHLORIDE: 112 mmol/L — ABNORMAL HIGH (ref 96–111)
CHLORIDE: 110 mmol/L (ref 96–111)
GLUCOSE: 158 mg/dL — ABNORMAL HIGH (ref 70–105)
GLUCOSE: 161 mg/dL — ABNORMAL HIGH (ref 70–105)
HEMATOCRIT: 28 % — ABNORMAL LOW (ref 33.5–45.2)
HEMATOCRIT: 26 % — ABNORMAL LOW (ref 33.5–45.2)
HEMOGLOBIN: 9.3 g/dL — ABNORMAL LOW (ref 12.0–16.0)
HEMOGLOBIN: 8.5 g/dL — ABNORMAL LOW (ref 12.0–16.0)
IONIZED CALCIUM: 1.22 mmol/L — ABNORMAL LOW (ref 1.30–1.46)
IONIZED CALCIUM: 1.25 mmol/L — ABNORMAL LOW (ref 1.30–1.46)
LACTATE: 3.1 mmol/L — ABNORMAL HIGH (ref <1.3)
LACTATE: 1.8 mmol/L — ABNORMAL HIGH (ref <1.3)
MET-HEMOGLOBIN: 0.5 % (ref 0.0–3.0)
MET-HEMOGLOBIN: 1.3 % (ref 0.0–3.0)
O2CT: 13.2 — ABNORMAL LOW (ref 15.7–21.6)
O2CT: 11.9 — ABNORMAL LOW (ref 15.7–21.6)
OXYHEMOGLOBIN: 97.6 % (ref 85.0–98.0)
OXYHEMOGLOBIN: 96.5 % (ref 85.0–98.0)
PCO2: 17 mmHg — CL (ref 33.1–43.1)
PCO2: 24 mmHg — CL (ref 33.1–43.1)
PH: 7.54 — ABNORMAL HIGH (ref 7.350–7.450)
PH: 7.42 (ref 7.350–7.450)
PIO2/FIO2 RATIO: 640 (ref >300)
PIO2/FIO2 RATIO: 553 (ref >300)
PO2: 192 mmHg — ABNORMAL HIGH (ref 72–100)
PO2: 166 mmHg — ABNORMAL HIGH (ref 72–100)
SODIUM: 136 mmol/L (ref 136–145)
WHOLE BLOOD K+: 4.2 mmol/L (ref 3.5–5.0)
WHOLE BLOOD K+: 4.1 mmol/L (ref 3.5–5.0)

## 2010-12-31 LAB — PERFORM POC WHOLE BLOOD GLUCOSE
GLUCOSE, POINT OF CARE: 152 mg/dL — ABNORMAL HIGH (ref 70–105)
GLUCOSE, POINT OF CARE: 125 mg/dL — ABNORMAL HIGH (ref 70–105)
GLUCOSE, POINT OF CARE: 121 mg/dL — ABNORMAL HIGH (ref 70–105)
GLUCOSE, POINT OF CARE: 128 mg/dL — ABNORMAL HIGH (ref 70–105)
GLUCOSE, POINT OF CARE: 167 mg/dL — ABNORMAL HIGH (ref 70–105)
GLUCOSE, POINT OF CARE: 148 mg/dL — ABNORMAL HIGH (ref 70–105)

## 2010-12-31 LAB — CBC/DIFF
BANDS ABS: 0.32 THOU/uL (ref 0.0–0.5)
BANDS: 2 % (ref 0–5)
BASOPHILS: 0 % (ref 0–1)
BASOS ABS: 0 THOU/uL (ref 0.0–0.2)
EOS ABS: 0 THOU/uL — ABNORMAL LOW (ref 0.1–0.3)
EOSINOPHIL: 0 % — ABNORMAL LOW (ref 1–6)
HCT: 28 % — ABNORMAL LOW (ref 33.5–45.2)
HGB: 9.7 g/dL — ABNORMAL LOW (ref 11.5–15.2)
LYMPHOCYTES: 18 % — ABNORMAL LOW (ref 20–45)
LYMPHS ABS: 2.88 THOU/uL (ref 1.0–4.8)
MCH: 30.4 pg (ref 27.4–33.0)
MCHC: 34.5 g/dL (ref 31.6–35.5)
MCV: 88.1 fL (ref 82.0–99.0)
MONOCYTES: 7 % (ref 4–13)
MONOS ABS: 1.12 THOU/uL — ABNORMAL HIGH (ref 0.1–0.9)
PLATELET COUNT: 156 THOU/uL (ref 140–450)
PMN ABS: 11.68 THOU/uL — ABNORMAL HIGH (ref 1.5–7.7)
PMN'S: 73 % (ref 40–75)
RBC: 3.18 MIL/uL — ABNORMAL LOW (ref 3.84–5.04)
RDW: 12.5 % (ref 10.2–14.0)
WBC: 16 THOU/uL — ABNORMAL HIGH (ref 3.5–11.0)

## 2010-12-31 LAB — ARTERIAL BLOOD GAS/LACTATE/CO-OX/LYTES (NA/K/CA/CL/GLUC) (TEMP COMP)
CHLORIDE: 109 mmol/L (ref 96–111)
CO2T: 38 MM HG (ref 33.1–43.1)
MET-HEMOGLOBIN: 0 % (ref 0.0–3.0)
SODIUM: 134 mmol/L — ABNORMAL LOW (ref 136–145)

## 2010-12-31 LAB — PHOSPHORUS
PHOSPHORUS: 4.6 mg/dL (ref 2.4–4.7)
PHOSPHORUS: 2.8 mg/dL (ref 2.4–4.7)

## 2010-12-31 LAB — MAGNESIUM
MAGNESIUM: 1.4 mg/dL — ABNORMAL LOW (ref 1.7–2.5)
MAGNESIUM: 2 mg/dL (ref 1.7–2.5)

## 2010-12-31 LAB — PT/INR
INR: 1.1 (ref 0.8–1.2)
PROTHROMBIN TIME: 11.7 s — ABNORMAL HIGH (ref 9.1–11.5)

## 2010-12-31 LAB — ARTERIAL BLOOD GAS/LACTATE/CO-OX/LYTES (NA/K/CA/CL/GLUC): SODIUM: 137 mmol/L (ref 136–145)

## 2010-12-31 LAB — ARTERIAL BLOOD GAS/K/CA/CO-OX - INACTIVE: CARBOXYHEMOGLOBIN: 0 % (ref 0.0–2.5)

## 2010-12-31 SURGERY — CYSTECTOMY RADICAL
Anesthesia: General | Site: Bladder | Wound class: Clean Contaminated Wounds-The respiratory, GI, Genital, or urinary

## 2010-12-31 MED ORDER — DEXMEDETOMIDINE 100 MCG/ML INTRAVENOUS SOLUTION
0.20 ug/kg/h | INTRAVENOUS | Status: AC
Start: 2010-12-31 — End: 2011-01-01
  Administered 2010-12-31: 0.2 ug/kg/h via INTRAVENOUS
  Administered 2011-01-01: 0 ug/kg/h via INTRAVENOUS
  Administered 2011-01-01: 0.2 ug/kg/h via INTRAVENOUS
  Administered 2011-01-01: 0 ug/kg/h via INTRAVENOUS
  Filled 2010-12-31 (×2): qty 2

## 2010-12-31 MED ORDER — WATER FOR IRRIGATION, STERILE SOLUTION
1000.0000 mL | Status: DC | PRN
Start: 2010-12-31 — End: 2010-12-31
  Administered 2010-12-31: 1000 mL

## 2010-12-31 MED ORDER — CALCIUM GLUCONATE 100 MG/ML (10 %) INTRAVENOUS SOLUTION
1000.0000 mg | Freq: Once | INTRAVENOUS | Status: AC
Start: 2010-12-31 — End: 2010-12-31
  Administered 2010-12-31: 1000 mg via INTRAVENOUS
  Filled 2010-12-31: qty 10

## 2010-12-31 MED ORDER — LABETALOL 5 MG/ML INTRAVENOUS SYRINGE
5.00 mg | INJECTION | INTRAVENOUS | Status: DC | PRN
Start: 2010-12-31 — End: 2011-01-07
  Administered 2010-12-31 – 2011-01-01 (×2): 5 mg via INTRAVENOUS
  Filled 2010-12-31 (×2): qty 4

## 2010-12-31 MED ORDER — ESOMEPRAZOLE MAGNESIUM 40 MG CAPSULE,DELAYED RELEASE
40.00 mg | DELAYED_RELEASE_CAPSULE | Freq: Every morning | ORAL | Status: DC
Start: 2010-12-31 — End: 2011-01-02
  Administered 2010-12-31 – 2011-01-02 (×3): 40 mg via ORAL
  Filled 2010-12-31 (×3): qty 1

## 2010-12-31 MED ORDER — MIDAZOLAM 1 MG/ML INJECTION SOLUTION
INTRAMUSCULAR | Status: AC
Start: 2010-12-31 — End: 2010-12-31
  Filled 2010-12-31: qty 2

## 2010-12-31 MED ORDER — HYDROMORPHONE (PF) 1 MG/ML INJECTION SOLUTION
INTRAMUSCULAR | Status: AC
Start: 2010-12-31 — End: 2010-12-31
  Filled 2010-12-31: qty 1

## 2010-12-31 MED ORDER — THROMBIN(HUMAN)-FIBRINOGEN-APROTININ-CALCIUM 10 ML TOPICAL KIT
PACK | Freq: Once | CUTANEOUS | Status: DC | PRN
Start: 2010-12-31 — End: 2010-12-31
  Administered 2010-12-31: 10 mL via TOPICAL
  Filled 2010-12-31 (×2): qty 1

## 2010-12-31 MED ORDER — SIMVASTATIN 40 MG TABLET
40.00 mg | ORAL_TABLET | Freq: Every evening | ORAL | Status: DC
Start: 2010-12-31 — End: 2010-12-31

## 2010-12-31 MED ORDER — MAGNESIUM SULFATE 4 GRAM/100 ML (4 %) IN WATER INTRAVENOUS PIGGYBACK
4.0000 g | INJECTION | Freq: Once | INTRAVENOUS | Status: AC
Start: 2010-12-31 — End: 2010-12-31
  Administered 2010-12-31: 4 g via INTRAVENOUS
  Filled 2010-12-31: qty 100

## 2010-12-31 MED ORDER — LACTATED RINGERS IV BOLUS
500.0000 mL | INJECTION | Freq: Once | Status: AC
Start: 2011-01-01 — End: 2010-12-31
  Administered 2010-12-31 – 2011-01-01 (×2): 500 mL via INTRAVENOUS

## 2010-12-31 MED ORDER — GELATIN MATRIX SEALANT (FLOSEAL) 10 ML KIT
PACK | CUTANEOUS | Status: AC
Start: 2010-12-31 — End: 2010-12-31
  Filled 2010-12-31: qty 1

## 2010-12-31 MED ORDER — METHYLNALTREXONE 12 MG/0.6 ML SUBCUTANEOUS SOLUTION
0.30 mg/kg | Freq: Four times a day (QID) | SUBCUTANEOUS | Status: DC
Start: 2010-12-31 — End: 2011-01-01
  Administered 2010-12-31 – 2011-01-01 (×3): 26.2 mg via SUBCUTANEOUS
  Filled 2010-12-31 (×7): qty 1.31

## 2010-12-31 MED ORDER — SODIUM CHLORIDE 0.9 % (FLUSH) INJECTION SYRINGE
2.0000 mL | INJECTION | INTRAMUSCULAR | Status: DC | PRN
Start: 2010-12-31 — End: 2011-01-07

## 2010-12-31 MED ORDER — DOCUSATE SODIUM 50 MG/5 ML ORAL LIQUID
50.00 mg | Freq: Two times a day (BID) | ORAL | Status: DC
Start: 2010-12-31 — End: 2011-01-02
  Administered 2010-12-31 – 2011-01-01 (×3): 0 mg via ORAL
  Administered 2011-01-02: 50 mg via ORAL
  Filled 2010-12-31 (×5): qty 10

## 2010-12-31 MED ORDER — INSULIN REGULAR HUMAN 100 UNIT/ML INJECTION SSIP
4.0000 [IU] | INJECTION | Freq: Four times a day (QID) | SUBCUTANEOUS | Status: DC | PRN
Start: 2010-12-31 — End: 2011-01-07
  Administered 2011-01-01: 8 [IU] via SUBCUTANEOUS
  Administered 2011-01-01: 12 [IU] via SUBCUTANEOUS
  Administered 2011-01-03 – 2011-01-05 (×3): 4 [IU] via SUBCUTANEOUS
  Administered 2011-01-05 – 2011-01-06 (×2): 8 [IU] via SUBCUTANEOUS
  Filled 2010-12-31 (×2): qty 3

## 2010-12-31 MED ORDER — HYDROMORPHONE (PF) 1 MG/ML INJECTION SOLUTION
0.2000 mg | INTRAMUSCULAR | Status: DC | PRN
Start: 2010-12-31 — End: 2010-12-31

## 2010-12-31 MED ORDER — LACTATED RINGERS IV BOLUS
500.0000 mL | INJECTION | Freq: Once | Status: AC
Start: 2010-12-31 — End: 2010-12-31
  Administered 2010-12-31: 500 mL via INTRAVENOUS

## 2010-12-31 MED ORDER — LACTATED RINGERS INTRAVENOUS SOLUTION
INTRAVENOUS | Status: DC
Start: 2010-12-31 — End: 2010-12-31

## 2010-12-31 MED ORDER — ALBUMIN, HUMAN 5 % INTRAVENOUS SOLUTION
25.0000 g | Freq: Once | INTRAVENOUS | Status: AC
Start: 2010-12-31 — End: 2010-12-31
  Administered 2010-12-31: 25 g via INTRAVENOUS

## 2010-12-31 MED ORDER — ACETAMINOPHEN 325 MG TABLET
650.0000 mg | ORAL_TABLET | Freq: Four times a day (QID) | ORAL | Status: DC | PRN
Start: 2010-12-31 — End: 2011-01-02

## 2010-12-31 MED ORDER — CHROMIUM PICOLINATE 500 MCG CAPSULE
1.00 | ORAL_CAPSULE | Freq: Two times a day (BID) | ORAL | Status: DC
Start: 2010-12-31 — End: 2010-12-31

## 2010-12-31 MED ORDER — MAGNESIUM SULFATE 4 GRAM/100 ML (4 %) IN WATER INTRAVENOUS PIGGYBACK
4.0000 g | INJECTION | Freq: Once | INTRAVENOUS | Status: DC
Start: 2010-12-31 — End: 2010-12-31

## 2010-12-31 MED ORDER — SODIUM CHLORIDE 0.9 % INTRAVENOUS SOLUTION
2.0000 [IU]/h | INTRAVENOUS | Status: DC
Start: 2010-12-31 — End: 2011-01-01
  Administered 2010-12-31: 2.5 [IU]/h via INTRAVENOUS
  Administered 2010-12-31: 3.5 [IU]/h via INTRAVENOUS
  Administered 2010-12-31: 2 [IU]/h via INTRAVENOUS
  Administered 2010-12-31: 3 [IU]/h via INTRAVENOUS
  Administered 2011-01-01 (×2): 0 [IU]/h via INTRAVENOUS
  Administered 2011-01-01: 3 [IU]/h via INTRAVENOUS
  Administered 2011-01-01: 2 [IU]/h via INTRAVENOUS
  Filled 2010-12-31: qty 2.5

## 2010-12-31 MED ORDER — EPHEDRINE SULFATE 50 MG/ML INJECTION SOLUTION
INTRAMUSCULAR | Status: AC
Start: 2010-12-31 — End: 2010-12-31
  Filled 2010-12-31: qty 1

## 2010-12-31 MED ORDER — SIMVASTATIN 20 MG TABLET
40.00 mg | ORAL_TABLET | Freq: Every evening | ORAL | Status: DC
Start: 2010-12-31 — End: 2011-01-02
  Administered 2010-12-31 – 2011-01-01 (×2): 40 mg via ORAL
  Filled 2010-12-31 (×4): qty 2

## 2010-12-31 MED ORDER — CITALOPRAM 40 MG TABLET
40.0000 mg | ORAL_TABLET | Freq: Every day | ORAL | Status: DC
Start: 2010-12-31 — End: 2011-01-02
  Administered 2010-12-31: 0 mg via ORAL
  Administered 2011-01-01 – 2011-01-02 (×2): 40 mg via ORAL
  Filled 2010-12-31 (×3): qty 1

## 2010-12-31 MED ORDER — PHENYLEPHRINE 10 MG/ML INJECTION SOLUTION
INTRAMUSCULAR | Status: AC
Start: 2010-12-31 — End: 2011-01-01
  Filled 2010-12-31: qty 6

## 2010-12-31 MED ORDER — FENTANYL (PF) 50 MCG/ML INJECTION SOLUTION
INTRAMUSCULAR | Status: AC
Start: 2010-12-31 — End: 2010-12-31
  Filled 2010-12-31: qty 4

## 2010-12-31 MED ORDER — HYDROMORPHONE (PF) 1 MG/ML INJECTION SOLUTION
0.2000 mg | INTRAMUSCULAR | Status: DC | PRN
Start: 2010-12-31 — End: 2010-12-31
  Filled 2010-12-31: qty 1

## 2010-12-31 MED ORDER — ENALAPRIL MALEATE 2.5 MG TABLET
2.5000 mg | ORAL_TABLET | Freq: Every day | ORAL | Status: DC
Start: 2010-12-31 — End: 2010-12-31
  Administered 2010-12-31: 0 mg via ORAL
  Filled 2010-12-31: qty 1

## 2010-12-31 MED ORDER — SODIUM CHLORIDE 0.9 % (FLUSH) INJECTION SYRINGE
2.00 mL | INJECTION | INTRAMUSCULAR | Status: DC | PRN
Start: 2010-12-31 — End: 2010-12-31

## 2010-12-31 MED ORDER — SODIUM CHLORIDE 0.9 % (FLUSH) INJECTION SYRINGE
2.00 mL | INJECTION | Freq: Three times a day (TID) | INTRAMUSCULAR | Status: DC
Start: 2010-12-31 — End: 2010-12-31

## 2010-12-31 MED ORDER — HYDRALAZINE 20 MG/ML INJECTION SOLUTION
10.0000 mg | INTRAMUSCULAR | Status: DC | PRN
Start: 2010-12-31 — End: 2011-01-07
  Administered 2011-01-01: 0 mg via INTRAVENOUS
  Administered 2011-01-01 – 2011-01-07 (×10): 10 mg via INTRAVENOUS
  Filled 2010-12-31 (×10): qty 1

## 2010-12-31 MED ORDER — POTASSIUM CHLORIDE 20 MEQ/L IN DEXTROSE 5 %-0.45 % SODIUM CHLORIDE IV
INTRAVENOUS | Status: DC
Start: 2010-12-31 — End: 2010-12-31

## 2010-12-31 MED ORDER — SODIUM CHLORIDE 0.9 % INTRAVENOUS SOLUTION
2.0000 g | INTRAVENOUS | Status: DC
Start: 2010-12-31 — End: 2011-01-01
  Administered 2010-12-31: 2 g via INTRAVENOUS
  Filled 2010-12-31 (×2): qty 20

## 2010-12-31 MED ORDER — LACTATED RINGERS INTRAVENOUS SOLUTION
INTRAVENOUS | Status: DC
Start: 2010-12-31 — End: 2011-01-01
  Administered 2011-01-01: 0 via INTRAVENOUS

## 2010-12-31 MED ORDER — BACITRACIN ZINC 500 UNIT/GRAM TOPICAL OINTMENT
TOPICAL_OINTMENT | CUTANEOUS | Status: AC
Start: 2010-12-31 — End: 2010-12-31
  Filled 2010-12-31: qty 15

## 2010-12-31 MED ORDER — GRANISETRON HCL 1 MG/ML (1 ML) INTRAVENOUS SOLUTION
1.0000 mg | Freq: Once | INTRAVENOUS | Status: DC | PRN
Start: 2010-12-31 — End: 2010-12-31

## 2010-12-31 MED ORDER — HYDROMORPHONE (PF) 1 MG/ML INJECTION SOLUTION
0.40 mg | INTRAMUSCULAR | Status: DC | PRN
Start: 2010-12-31 — End: 2011-01-03
  Administered 2011-01-01: 0.4 mg via INTRAVENOUS
  Filled 2010-12-31 (×4): qty 1

## 2010-12-31 MED ORDER — SODIUM CHLORIDE 0.9 % (FLUSH) INJECTION SYRINGE
2.0000 mL | INJECTION | Freq: Three times a day (TID) | INTRAMUSCULAR | Status: DC
Start: 2010-12-31 — End: 2011-01-07
  Administered 2010-12-31: 2 mL
  Administered 2011-01-01 (×2): 0 mL
  Administered 2011-01-01 – 2011-01-03 (×5): 2 mL
  Administered 2011-01-03: 0 mL
  Administered 2011-01-03: 2 mL
  Administered 2011-01-04: 0 mL
  Administered 2011-01-04 – 2011-01-06 (×6): 2 mL
  Administered 2011-01-06: 0 mL
  Administered 2011-01-06: 10 mL
  Administered 2011-01-07: 0 mL

## 2010-12-31 MED ORDER — ONDANSETRON HCL (PF) 4 MG/2 ML INJECTION SOLUTION
4.0000 mg | Freq: Four times a day (QID) | INTRAMUSCULAR | Status: DC | PRN
Start: 2010-12-31 — End: 2011-01-07
  Administered 2010-12-31 – 2011-01-01 (×3): 4 mg via INTRAVENOUS
  Filled 2010-12-31 (×3): qty 2

## 2010-12-31 MED ORDER — PHENYLEPHRINE 10 MG/ML INJECTION SOLUTION
0.2000 ug/kg/min | INTRAMUSCULAR | Status: DC
Start: 2010-12-31 — End: 2010-12-31
  Administered 2010-12-31: 0.3 ug/kg/min via INTRAVENOUS
  Administered 2010-12-31: 0.6 ug/kg/min via INTRAVENOUS
  Administered 2010-12-31: 0.8 ug/kg/min via INTRAVENOUS
  Administered 2010-12-31: 1 ug/kg/min via INTRAVENOUS
  Administered 2010-12-31: 0 ug/kg/min via INTRAVENOUS
  Administered 2010-12-31: 0.2 ug/kg/min via INTRAVENOUS

## 2010-12-31 MED ORDER — SENNOSIDES 8.6 MG-DOCUSATE SODIUM 50 MG TABLET
1.0000 | ORAL_TABLET | Freq: Two times a day (BID) | ORAL | Status: DC
Start: 2010-12-31 — End: 2010-12-31
  Filled 2010-12-31: qty 1

## 2010-12-31 MED ORDER — MIDAZOLAM 1 MG/ML INJECTION SOLUTION
INTRAMUSCULAR | Status: AC
Start: 2010-12-31 — End: 2010-12-31
  Filled 2010-12-31: qty 1

## 2010-12-31 MED ORDER — DEXTROSE 50 % IN WATER (D50W) INTRAVENOUS SYRINGE
12.5000 g | INJECTION | INTRAVENOUS | Status: DC | PRN
Start: 2010-12-31 — End: 2011-01-01

## 2010-12-31 MED ORDER — HYDROMORPHONE (PF) 1 MG/ML INJECTION SOLUTION
0.6000 mg | INTRAMUSCULAR | Status: DC | PRN
Start: 2010-12-31 — End: 2010-12-31

## 2010-12-31 MED ORDER — VERAPAMIL ER (SR) 240 MG TABLET,EXTENDED RELEASE
240.0000 mg | ORAL_TABLET | Freq: Every day | ORAL | Status: DC
Start: 2010-12-31 — End: 2010-12-31
  Filled 2010-12-31: qty 1

## 2010-12-31 MED ORDER — ESOMEPRAZOLE SODIUM 40 MG INTRAVENOUS SOLUTION
40.0000 mg | Freq: Every day | INTRAVENOUS | Status: DC
Start: 2010-12-31 — End: 2010-12-31

## 2010-12-31 MED ORDER — MULTIVITAMIN TABLET
1.0000 | ORAL_TABLET | Freq: Every day | ORAL | Status: DC
Start: 2010-12-31 — End: 2011-01-01
  Administered 2010-12-31 – 2011-01-01 (×2): 1 via ORAL
  Filled 2010-12-31 (×2): qty 1

## 2010-12-31 MED ORDER — PROCHLORPERAZINE EDISYLATE 10 MG/2 ML (5 MG/ML) INJECTION SOLUTION
5.0000 mg | Freq: Once | INTRAMUSCULAR | Status: DC | PRN
Start: 2010-12-31 — End: 2010-12-31

## 2010-12-31 MED ORDER — PROCHLORPERAZINE EDISYLATE 10 MG/2 ML (5 MG/ML) INJECTION SOLUTION
5.0000 mg | Freq: Three times a day (TID) | INTRAMUSCULAR | Status: DC | PRN
Start: 2010-12-31 — End: 2010-12-31

## 2010-12-31 MED ORDER — SENNOSIDES 8.8 MG/5 ML ORAL SYRUP
5.00 mL | ORAL_SOLUTION | Freq: Two times a day (BID) | ORAL | Status: DC
Start: 2010-12-31 — End: 2011-01-02
  Administered 2010-12-31 – 2011-01-01 (×3): 0 mg via ORAL
  Administered 2011-01-02: 8.8 mg via ORAL
  Filled 2010-12-31 (×5): qty 5

## 2010-12-31 SURGICAL SUPPLY — 90 items
ADAPTER UROSTOMY DRAIN TUBE OC0108-10-030 2EA/PK (WOUND CARE SUPPLY) ×2 IMPLANT
APPL 70% ISPRP 2% CHG 26ML CHLRPRP HI-LT ORNG PREP STRL LF  DISP CLR (WOUND CARE SUPPLY) ×2 IMPLANT
APPLIER PREM SRGCLP II SUP INTLK 9.75IN ATO INTERNAL CLIP VAS LF  DISP ENDOS RADGR MRK 20 MED TI (ENDOSCOPIC SUPPLIES) IMPLANT
APPLIER PREM SRGCLP SUP INTLK 11.5IN 30 ATO CLIP LGT ERG HNDL TAB RATCHET CLSR TI MED INTERNAL CLIP (ENDOSCOPIC SUPPLIES) ×2 IMPLANT
APPLIER SUP INTLK PREM SRGCLP 13IN 13 CLIP LGT ATO ERG HNDL TAB TI LRG INTERNAL CLIP VAS LF  DISP (ENDOSCOPIC SUPPLIES) ×2 IMPLANT
BAG DRAIN 4L LRG CPC RND TEARDRP CNTR ENTRY SP STRL LF  DISP (MISCELLANEOUS PT CARE ITEMS) ×4 IMPLANT
BLANKET 3M BAIR HUG ADLT LWR B ODY 60X36IN PLMR AIR SYS LTWT (MISCELLANEOUS PT CARE ITEMS) IMPLANT
BLANKET 3M BAIR HUG ADLT UPR B ODY 74X24IN PLMR 2 INCS ADH (MISCELLANEOUS PT CARE ITEMS) ×2 IMPLANT
BLANKET 3M BAIR HUG UNDERBODY 84X36IN FULL ACCESS FLUID (MISCELLANEOUS PT CARE ITEMS) IMPLANT
CATH URETH 20FR FOLEY 2W BAL UNCT SIL 5CC STRL LF  DISP (UROLOGICAL SUPPLIES) ×2 IMPLANT
CATH URETH BARD LUBRICATH 22FR FOLEY 2W BAL LUB SIL DDRGL 30CC STRL LTX DISP AMBR (UROLOGICAL SUPPLIES) IMPLANT
CATH URETH BARD LUBRICATH 22FR FOLEY 2W BAL LUB SIL DDRGL 5CC STRL LTX DISP AMBR (UROLOGICAL SUPPLIES) IMPLANT
CATH URETH DOVER RBNL 14FR 16IN Ã¡STGR DRAIN EYE RND CLS TIP INT FNL CONN PVC STRL LF  DISP (UROLOGICAL SUPPLIES) IMPLANT
CATH URETH LBRCTH 22FR FL 2W B_AL LUB SIL DDRGL 5CC STRL LTX (UROLOGICAL SUPPLIES)
CATH URETH LUBRICATH 16FR FOLEY 2W BAL LUB SIL DDRGL 5CC STRL LTX DISP (UROLOGICAL SUPPLIES) ×2 IMPLANT
CLIP LIGATING MED LS200 36/BX 3.2MM (GENE) IMPLANT
CONV USE ITEM 156524 - ADHESIVE TISSUE EXOFIN 1.0ML_PREMIERPRO EXOFIN (SEALANTS) ×2 IMPLANT
CONV USE ITEM 306873 - COVER STAND 23IN MAYO CNVRT PLASTIC REINF STRL LF  DISP (DRAPE/PACKS/SHEETS/OR TOWEL) ×2 IMPLANT
CONV USE ITEM 338653 - PACK SURG ABDOMINAL NONST DISP LF (CUSTOM TRAYS & PACK) ×2 IMPLANT
COVER STAND 23IN MAYO CNVRT PLASTIC REINF STRL LF  DISP (DRAPE/PACKS/SHEETS/OR TOWEL) ×2
DEVICE ENSEAL 5MM X 35CM NSEAL-535RH 6EA/BX (SURGICAL CUTTING SUPPLIES) ×2 IMPLANT
DEVICE SECURE FOLEY CATH ANCH LONG WR TIME ADH PVC FOAM LTX (UROLOGICAL SUPPLIES) IMPLANT
DISC USE 162466 - BAG DRAIN UROLOGY 2000ML LF_154003 20EA/CS (UROLOGICAL SUPPLIES) ×2 IMPLANT
DISC USE 162466 BAG DRAIN UROLOGY 2000ML LF_154003 20EA/CS (UROLOGICAL SUPPLIES) ×1
DISC USE 30200_ENDO GIA ROTIC 45 2.5 DLU_BX/6 030454 (ENDOSCOPIC SUPPLIES) IMPLANT
DISCONTINUED NO SUB - JELLY LUB DYNALUBE BCTRST WATER SOL NGRS PKT STRL 5GM LF (WOUND CARE SUPPLY) ×8 IMPLANT
DISCONTINUED USE 136321 - CATH URETH BARD LUBRICATH 22FR FOLEY 2W BAL LUB SIL DDRGL 30CC STRL LTX DISP AMBR (UROLOGICAL SUPPLIES) IMPLANT
DISCONTINUED USE 307230 - LOOP VESSEL MINI 457X.83MM LF  YW BND PCH SIL DISP STRL (WOUND CARE SUPPLY) ×2 IMPLANT
DISCONTINUED USE 329385 - SUTURE 5-0 C-1 PROLENE 36IN BL_U 2 ARM MONOF NONAB (SUTURE/WOUND CLOSURE) ×2 IMPLANT
DISCONTINUED USE ITEM 35894 - DRAPE MAG 16X10IN DVN FOAM FLXB 121 STRL LF  DISP (EQUIPMENT MINOR) ×2 IMPLANT
DISCONTINUED USE ITEM 61864 - SUTURE 2-0 UR-6 VICRYL 27IN VIOL BRD COAT ABS (SUTURE/WOUND CLOSURE) ×4 IMPLANT
DISCONTINUED USE ITEM 91401 - SUTURE 0 UR-6 VICRYL 27IN VIOL BRD COAT ABS (SUTURE/WOUND CLOSURE) ×4 IMPLANT
DISSECTOR KITTNER ST 23275410 40PK/BX (SPNG) ×2 IMPLANT
DONUT EXTREMITY CUSHIONING 31143137 (POSITIONING PRODUCTS) ×2 IMPLANT
DRAIN INCS .25IN 12IN PNRS RUB SAF PIN RADOPQ STRL LTX STD DISP 4067 (Drains/Resovoirs) IMPLANT
DRAIN PENROSE STER 1/2 X 24IN DISP 9126A (Drains/Resovoirs) IMPLANT
DRAIN WOUND 20CMX10MM SIL FULL PRFR WO TROCAR JP FLAT (WOUND CARE SUPPLY) ×2 IMPLANT
DRAPE 44X44IN FLD WRM TEMP CNT_RL VSB DSPL PORT STND EQP ORS (DRAPE/PACKS/SHEETS/OR TOWEL)
DRAPE 66X44IN POLYUR WRMR ORS ORS FLUID WARM SYS STRL DISP (DRAPE/PACKS/SHEETS/OR TOWEL) ×2 IMPLANT
DRAPE FL CNTRL PCH DRAIN PORT FILTER SCRN 38X15IN UNDR BUTT CNVRT LF  STRL DISP SURG 27IN (PROTECTIVE PRODUCTS/GARMENTS) ×2 IMPLANT
DRAPE FLUID WRMR TEMP CONTROL PORT STAND 44X44IN ORS LF  STRL DISP EQP POLYUR CLR (DRAPE/PACKS/SHEETS/OR TOWEL) IMPLANT
DRAPE MAG 16X10IN DVN FOAM FLX_B 121 STRL LF DISP (EQUIPMENT MINOR) ×1
DRESS WOUND PRMPR NWVN LF  STRL DISP (WOUND CARE SUPPLY) IMPLANT
ELECTRODE ESURG BLADE 6.5IN 3/32IN VLAB STRL SS 1IN DISP STD SHAFT XTD LF (CAUTERY SUPPLIES) ×4 IMPLANT
ELECTRODE ESURG BLADE PNCL 10FT VLAB STRL SS DISP BUTTON SWH HEX LOCK CORD HLSTR LF  ACPT 3/32IN STD (CAUTERY SUPPLIES) ×2 IMPLANT
ENDO GIA RELOAD ROTIC 30X2.5 030451 6/BX (ENDOSCOPIC SUPPLIES) IMPLANT
GW URO .035IN 150CM 3CM SENSOR STR FLX TP RADOPQ NITINOL SS HDRPH PTFE URET LF (UROLOGICAL SUPPLIES) ×2 IMPLANT
HANDLE SUCT MEDIVAC FLXCLR REG CPC FLXB CLR STRL LF  DISP (SURGICAL INSTRUMENTS) ×2 IMPLANT
HEMOSTAT ABS 8X4IN FLXB SHR WV_SRGCL STRL DISP (WOUND CARE SUPPLY) ×6 IMPLANT
LEGGINGS SURG 43X28IN TLSCP FOLD CUF CNVRT 6IN STRL LF (DRAPE/PACKS/SHEETS/OR TOWEL) ×2 IMPLANT
LIGICLIP MED/LRG LS300 18EA/BX (GENE) IMPLANT
LOOP VESSEL MAXI 457X1MM LF  RD BAND PCH RADOPQ SIL RCHRD-ALLAN DISP STRL (WOUND CARE SUPPLY) ×2 IMPLANT
METER URN DRAIN BAG 350ML 2.5L STRL LF DISP (UROLOGICAL SUPPLIES) IMPLANT
NEEDLE SUT 4 .5 CRC 1.248IN .05IN FREE EYE TAPER PNT RCHRD-ALLAN MAYO SS CATGUT TISS CLSR STRL LF (NEEDLES & SYRINGE SUPPLIES) IMPLANT
PACK SURG ABDOMINAL NONST DISP LF (CUSTOM TRAYS & PACK) ×2
PAD ARMBRD BLU (POSITIONING PRODUCTS) ×4 IMPLANT
POUCH UROSTOMY 2PC CLEAR 18403 10/BX NEW IMAGE W/2.25IN FLANG (WOUND CARE SUPPLY) IMPLANT
RELOAD STPLR UNIV 45MMX2MM EGIA TI MSNTRY THN TISS 6 ROW LNR CTR KNIFE BLADE STRL PED 12MM LF  DISP (ENDOSCOPIC SUPPLIES) IMPLANT
RELOAD STPLR UNIV 60MMX2.5MM EGIA TI THN VAS TISS 6 ROW LNR CTR KNIFE BLADE STPL GAP CONTROL STRL (ENDOSCOPIC SUPPLIES) IMPLANT
RESERVOIR DRAIN SIL JP BULB 100CC STRL LF  DISP (WOUND CARE SUPPLY) ×2 IMPLANT
SEALDIVD ESURG 18CM 13.5MM LIGASURE IMPCT 14D 180D 34MM CURVE JAW OVAL SHAFT NANO COAT OPN 36MM VLAB (SUTURE LIGATING CLIPS) IMPLANT
SLEEVE SCD EXPRESS KNEE REG 5 PER CASE 9529 (EQUIPMENT MINOR) ×2 IMPLANT
SPONGE LAP 18X18IN STRL (WOUND CARE SUPPLY) ×20 IMPLANT
SPONGE SURG 4X4IN 16 PLY RADOPQ BAND VISTEC STRL LF  BLU WHT (WOUND CARE SUPPLY) ×2 IMPLANT
STAPLER INTERNAL 16CMX4MM PVC STD UNIV TISS STRL LF  DISP EGIA ENDOS 12MM (ENDOSCOPIC SUPPLIES) IMPLANT
STENT PERC URI DIV 7X80CM M0061602100 (UROLOGICAL SUPPLIES) IMPLANT
STENT POLARIS ULTRA NAUT 6FR 22CM URET 2 DRMTR TAPER TIP LOW PROF GRAD PRCF+ HDR+ 2 PGTL CURVE LF (STENT) ×2 IMPLANT
SUTURE 0 POLYSRB 30IN VIOL BRD TIE 6 STRN PCUT ABS (SUTURE/WOUND CLOSURE) ×2 IMPLANT
SUTURE 1 CT VICRYL 36IN UNDYED BRD COAT ABS (SUTURE/WOUND CLOSURE) ×4 IMPLANT
SUTURE 1 TP-1 PDS2 96IN VIOL MONOF LOOP ABS (SUTURE/WOUND CLOSURE) ×4 IMPLANT
SUTURE 2-0 FS ETHILON 18IN BLK MONOF NONAB (SUTURE/WOUND CLOSURE) ×2 IMPLANT
SUTURE 2-0 POLYSRB 30IN VIOL BRD TIE 6 STRN PCUT ABS (SUTURE/WOUND CLOSURE) ×6 IMPLANT
SUTURE 2-0 SH VICRYL 27IN UNDYED BRD COAT ABS (SUTURE/WOUND CLOSURE) ×2 IMPLANT
SUTURE 2-0 UR-6 VICRYL 27IN VIOL BRD COAT ABS (SUTURE/WOUND CLOSURE) ×4
SUTURE 3-0 POLYSRB 30IN VIOL BRD TIE 6 STRN PCUT ABS (SUTURE/WOUND CLOSURE) ×2 IMPLANT
SUTURE 4-0 SH-1 VICRYL 27IN VIOL BRD COAT ABS (SUTURE/WOUND CLOSURE) ×20 IMPLANT
SUTURE 4-0 SH-1 VICRYL 27IN VI_OL BRD COAT ABS (SUTURE/WOUND CLOSURE) ×10
SUTURE 5-0 C-1 PROLENE 36IN BL_U 2 ARM MONOF NONAB (SUTURE/WOUND CLOSURE) ×1
SUTURE SILK 0 PERMAHAND 30IN BLK BRD TIE 6 STRN PCUT NONAB (SUTURE/WOUND CLOSURE) ×2 IMPLANT
SUTURE SILK 2-0 PERMAHAND 30IN BLK BRD TIE 12 STRN PCUT NONAB (SUTURE/WOUND CLOSURE) ×2 IMPLANT
SUTURE SILK 2-0 SH PERMAHAND 30IN BLK BRD NONAB (SUTURE/WOUND CLOSURE) ×8 IMPLANT
SUTURE SILK 3-0 PERMAHAND 30IN BLK BRD TIE 12 STRN PCUT NONAB (SUTURE/WOUND CLOSURE) ×2 IMPLANT
SYRINGE 50ML LF  STRL GRAD N-PYRG DEHP-FR PVC FREE CATH TIP DISP CLR (NEEDLES & SYRINGE SUPPLIES) ×2 IMPLANT
SYRINGE BULB PLASTIC 60ML CS/50 67000 (BULBS FOR MEDICAL EQUIPMENT) ×2 IMPLANT
TAPE UMB COTTON 45X1/8IN STRL LF  FLAT (SUTURE/WOUND CLOSURE) IMPLANT
TOWEL SURG WHT 25X16IN RFDETECT COTTON RADOPQ RF DTBL STRL LF  DISP (PROTECTIVE PRODUCTS/GARMENTS) IMPLANT
TRAY SKIN SCRUB 8IN VNYL COTTON 6 WNG 6 SPONGE STICK 2 TIP APPL DRY STRL LF (KITS & TRAYS (DISPOSABLE)) ×2 IMPLANT
TUBE FEED 8FR 16IN POLYUR NEO .65ML ENTRL HUB END H RPSB LF  ORNG (Connecting Tubes/Misc) IMPLANT
TUBING SUCT CLR 20FT 9/32IN MEDIVAC NCDTV M/M CONN STRL LF (Suction) ×6 IMPLANT
WAFER OSTOMY MED 2.25IN 22-33I N MLD TCH SFN DRH 2 PC ACRDN (WOUND CARE SUPPLY) ×2 IMPLANT

## 2010-12-31 NOTE — H&P (Addendum)
[facility]                                                     H&P Update Form    [facility], [facility], 70 y.o. female  Date of Admission:  12/31/2010  Date of Birth:  18-Nov-1940    12/31/2010    STOP: IF H&P IS GREATER THAN 30 DAYS FROM SURGICAL DAY COMPLETE NEW H&P IS REQUIRED.    Outpatient Pre-Surgical H & P updated the day of the procedure.  1.  H&P completed within 30 days of surgical procedure was performed by Dr. Marzetta Board on 3/2 and has been reviewed, the patient has been examined, and no change has occured in the patients condition since the H&P was completed.       Change in medications: No      Last Menstrual Period: Not applicable      Comments:     2.  Patient continues to be appropiate candidate for planned surgical procedure. YES      Velva Harman, MD      I saw and examined the patient.  I reviewed the resident's note.  I agree with the findings and plan of care as documented in the resident's note.  Any exceptions/additions are edited/noted.    Caryl Ada, MD 12/31/2010, 8:27 AM

## 2010-12-31 NOTE — OR Surgeon (Addendum)
[facility]                                                     OPERATIVE SUMMARY    Patient Name: Celester, Morgan Number: [id number]  Date of Service: 12/31/2010   Date of Birth: Feb 07, 1941    PREOPERATIVE DIAGNOSES:   1. High Grade bladder cancer.   2. History of TCC of the left renal pelvis, status post left nephroureterectomy.     POSTOPERATIVE DIAGNOSES:   1. High Grade bladder cancer.   2. History of TCC of the left renal pelvis, status post left nephroureterectomy.     NAME OF PROCEDURES:   1. Exploratory Laparotomy.  2. Radical cystectomy with creation of an ileoconduit diversion.   3. Urethrectomy, along with removal of the anterior vaginal wall.   4. Bilateral pelvic lymphadenectomy.   5. Appendectomy  5. Retrograde insertion of a 6 X 20 cm double J ureteral stent into the right ureter  6. Exploratory laparotomy    SURGEONS: Derek Mound MD, Joycelyn Rua MD, Velva Harman, MD    ANESTHESIA: General endotracheal anesthesia.     ESTIMATED BLOOD LOSS: 1500 mL.     INTRAOPERATIVE BLOOD GIVEN: 2 units of packed red blood cells.     INTRAOPERATIVE FLUIDS:   1. 3600 mL of crystalloid.   2. 500 mL of albumin.     COMPLICATIONS: None.     DRAINS: 20 F silicone foley catheter inserted as a pelvic drain.    SPECIMENS: 1) Bladder, urethra, and part of the vaginal wall  2) Right ureteral margin, frozen section    INDICATIONS FOR PROCEDURE: The patient is a very pleasant 70 year old female with a history of TCC of the left renal pelvis, s/p left nephroureterectomy four years ago. She was recently found to have a bladder mass on imaging and subsequently underwent a TURBT. She was found to have high-grade urothelial carcinoma. A long discussion was held with the patient and her family regarding her plan of care. She has elected to undergo radical cystectomy with ileal conduit diversion. She presents today to undergo operative intervention.      OPERATIVE FINDINGS: Successful removal of the bladder and urethra in its entirety. The specimen was slightly adherent to the rectum but it was dissected off without complication. The bilateral pelvic lymph node dissection was performed successfully, and the patient was found to have lymphadenopathy bilaterally. The distal segment of the right ureter was sent for frozen section analysis and was negative for malignancy. A urinary diversion was performed, connecting the solitary right ureter into the ileal conduit. We brought the stoma out through the right lower quadrant. A handsewn anastomosis was performed to reanastomose the bowel back to itself.      DESCRIPTION OF PROCEDURE: After informed consent was obtained, the patient was brought back to the operating room and placed under general anesthesia. A femoral arterial line and a central line were placed by anesthesia. She was placed in the supine position, and her legs were placed in a low relaxed dorsal lithotomy position. Her genitalia and abdomen were prepped and draped in the standard sterile fashion. A 16-French Foley was passed into the bladder and the balloon was filled with 10 mL of saline. A midline infraumbilical incision was made down to the level of the pubic symphysis.  The incision was taken down to the rectus fascia. The rectus fascia was opened and the linea alba was split. The space of Retzius was then created. The peritoneum was opened and the urachus was ligated with a 2-0 Vicryl tie. The peritoneum was then dissected diagonally to the pelvic sidewall. The Enseal was also used to take down the peritoneum attached to the superior portion of the bladder wall. The right ureter was found at the level of the common iliac artery and encircled with a yellow vessel loop. An Enseal device was used to take down the pedicles of the bladder. The endopelvic fascia was opened bilaterally to the level of the urethra. Bovie cautery was then used to excise the external urethral meatus with the vaginal wall close to it. After this excision was made, the urethral meatus was brought back into the pelvis. Blunt dissection was used to free the bladder from the rectum. The right ureter was traced to its insertion into the bladder. A right-angle was placed to encircle the ureter, which was transected colse to the bladder. A clip was placed to allow distention of the right ureter. The ureteral margin was tied with a 2-0 Vicryl tie. The remainder of the bladder was then dissected with blunt dissection. The urethra was freed, along with the anterior wall of the vagina. The entire specimen was then removed. All the while,  adequate hemostasis was achieved and maintained via electrocautery and ligation of appropriate bleeding vessels. The vaginal cuff was then visualized and closed in a continuous running fashion using 2-0 Vicryl suture. Attention was then turned to the bilateral pelvic lymph nodes. We proceeded with the bilateral pelvic lymph node dissection starting on the left side. The tissue at the level of the bifurcation of the external and internal iliacs was taken. The vessels were skeletonized, being sure not to injure the obturator nerve. Clips were used to ligate the lymphatic channels. Please see the above findings. Surgicel was placed over the site of the lymph node dissection. A similar procedure was carried out on the contralateral side and the lymph nodes were removed and each placed in separate specimen cups to be sent to pathology for evaluation. We then proceeded with forming the ileoconduit urinary diversion. The distal segment of the right ureter was sent for frozen section analysis. This was negative for malignancy. Approximately 15 cm proximal to the ileocecal valve, a stitch was placed. Fifteen centimeters proximal to that a second stitch was placed. The mesocolon was dissected away from the bowel, creating a plane between the bowel and the mesocolon. This was performed at the level of the proximal and distal stitches. Bowel clamps were placed at the distal extent and the bowel was transected. A similar procedure was carried out on the proximal portion. The proximal and distal ends of the bowel were then anastomosed using 2-0 Vicryl in a running fashion. A reinforcing Lembert stitch was used using 2-0 silk in an interrupted fashion circumferentially around the anastomosis. The mesocolon was closed with 2-0 silk. The conduit was irrigated thoroughly and we proceeded with the ureteroenteric anastomosis using 4-0 Vicryl. The clip was cut off the right ureter and was spatulated. An enterotomy was made and the  ureter was anastomosed to the ileum using 4-0 Vicryl in an interrupted fashion. Prior to placing the last suture, a 6 X 22 cm Polaris ultra double J ureteral stent was placed. The proximal end of the conduit was closed using 2-0 Vicryl. The ureteral stent was placed at  the site of the stoma exit site. A circumferential incision was made on the skin using Bovie cautery and the incision was taken down to the level of the fascia. A cruciate incision was made in the fascia and the fascia was opened. A Tanja Port was used to bring the stoma out of the skin site. The stoma was fastened to the fascia using 2-0 Vicryl in interrupted fashion. A rosebud was then created using a combination of 2-0 and 3-0 Vicryl. The stent was irrigated without any difficulty. The abdomen was carefuly inspected for bowel injury and bleeding. A 20 Fr silicone foley catheter was placed in the pelvis via the urethra, and the fascia was closed using looped 1 PDS on a CT needle and the skin was closed with subcutaneous vicryl followed with staples for the epidermis. An ileostomy bag was placed over the patient's new stoma site.     Dr. Marzetta Board was present and supervised the entire case.   At the completion of the procedure, all needle, sponge and instrument counts were correct.     Charlann Boxer, M.D  Resident, Division of Urology  [facility]    I was present and participated in the entire procedure.    Caryl Ada, MD

## 2010-12-31 NOTE — OR Surgeon (Signed)
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    Procedure(s)/Description:  1) Open radical cystectomy with closure of vaginal cuff and urethrectomy                                               2) Creation of Ileal conduit diversion                                               3) Bilateral pelvic lymphadenectomy                                               4) Appendectomy                                               5) Exploratory laparotomy                                               6) Retrograde insertion of 6 X 20 cm double J ureteral stent into right ureter    Findings: 1) + Bilateral pelvic lymphadenopathy  2) Right ureteral margin negative for malignancy on frozen section  3) Solitary right ureter anastamosed to ileal conduit without incident (Bricker type)    Attending Surgeon: Derek Mound, MD    Assistant(s): Joycelyn Rua, MD; Velva Harman, MD.      Anesthesia Type: General endotracheal anesthesia    Estimated Blood Loss:  1500 ccs     Blood Given: 2 units of PRBCs          Fluids Given: 1) 3600 ccs crystalloid  2) 500 ccs colloid  Complications:  None    Tubes: Nasogastric Tube    Drains: 20 F silicone foley catheter inserted as pelvic drain           Specimens/ Cultures: 1) Right ureteral margin, frozen section  2) Bladder with anterior vaginal wall, en bloc            Implants: 6 X 20 cc Polaris ultra double J right ureteral stent           Disposition: ICU - intubated and hemodynamically stable.           Condition: stable      Joycelyn Rua, MD 12/31/2010, 3:11 PM

## 2010-12-31 NOTE — Progress Notes (Addendum)
Pt anuric s/p cystectomy, urthectomy, ileoconduit formation, and rt ureteral stent placement.  Urology notified, orders for stat KUB to confirm appropriate ureteral stent placement. Continue aggressive volume replacement to improve BD, hypovolemia post-op.  Will follow.    Mauricio Po, MD 12/31/2010, 9:40 PM  Lowella Petties, MD 01/01/2011, 10:47 PM

## 2010-12-31 NOTE — H&P (Addendum)
   [facility]                                 SICU ADMISSION        HISTORY and PHYSICAL    Christy Hale, Christy Hale  Date of Admission:  12/31/2010  Date of Birth:  August 14, 1941  Date of Service: 12/31/2010    Primary Attending: Armand  Primary Service: Urology    Information Obtained from: patient, health care provider and history reviewed via medical record  Chief Complaint: bladder CA    PCP: Velma Glatter, MD     HPI: (must include no less than 4 of the following main descriptors) Location (of pain): Quality (character of pain) Severity (minimal, mild, severe, scale or 1-10) Duration (how long has pain/sx present) Timing (when does pain/sx occur)  Context (activity at/before onset) Modifying Factors (what makes pain/sx  Better/worse) Associate Sign/Sx (what accompanies main pain/sx)     OR Procedure:   1) Open radical cystectomy with closure of vaginal cuff and urethrectomy   2) Creation of Ileal conduit diversion   3) Bilateral pelvic lymphadenectomy   4) Appendectomy   5) Exploratory laparotomy   6) Retrograde insertion of 6 X 20 cm double J ureteral stent into right ureter    I/O:(last 24 hours from current time):   Intake/Output Summary (Last 24 hours) at 12/31/10 1627  Last data filed at 12/31/10 1600   Gross per 24 hour   Intake     50 ml   Output      0 ml   Net     50 ml          Blood Products: 2 units PRBCs  Crystalloids: 4000ml Colloids: 500ml  EBL:   UOP: unmeasured    Christy Hale is a 70 y.o., White female transferred to SICU s/p radical cystectomy, ex-lap and creation of ileal conduit. 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 S/p left nephrectomy in 2009.    Patient is still slightly sedated from surgery but denies any pain.     ROS:  MUST comment on all Abnormal findings   ROS Review of systems was not obtained due to sedation .    PAST  MEDICAL/ FAMILY/ SOCIAL HISTORY:   Past Medical History   Diagnosis Date   . Hypertension    . Ulcer disease    . Anemia    . Lung cancer      left lung, unknown type   . Arthritis    . Diabetes mellitus    . Hyperlipidemia    . History of transfusion      no reaction   . GERD (gastroesophageal reflux disease)      hx of, no longer takes meds   . Kidney disease      s/p nephrectomy due to cancer   . Type 2 diabetes mellitus      dx 20 + yrs ago, does not check fs at home   . cancer      s/p renal cancer, lung cancer, skin cancer, and now with bladder cancer       No Known Allergies  Medications Prior to Admission    Medication    Chromium  Picolinate 500 mcg Oral Capsule    take 1 Tab by mouth Twice daily.    Simvastatin  (ZOCOR )  40 mg Oral Tablet    take 40 mg by mouth QPM.    INSULIN  LISPRO (HUMALOG  SUBQ)    8 Units by Subcutaneous route Twice daily.    enalapril  (VASOTEC ) 2.5 mg Oral Tablet    take 2.5 mg by mouth Once a day.    NIACIN, INOSITOL NIACINATE, ORAL    take 500 mg by mouth Once a day.    verapamil  240 mg Oral Tablet Sustained Release    take 240 mg by mouth Once a day.    citalopram  (CELEXA ) 40 mg Oral Tablet    take 40 mg by mouth Once a day.    Hydrocodone -Acetaminophen  (LORTAB) 2.5-500 mg Oral Tablet tablet    take 1 Tab by mouth Every 6 hours as needed.    INSULIN  GLARGINE,HUM.REC.ANLOG (LANTUS  SUBQ)    34 Units by Subcutaneous route Once a day.    Omeprazole (PRILOSEC) 40 mg Oral Capsule, Delayed Release(E.C.)    take 20 mg by mouth Twice daily.    sucralfate  (CARAFATE ) 1 gram Oral Tablet    take 1 g by mouth Four times a day - before meals and bedtime.    potassium chloride  (KLOR-CON ) 20 mEq Oral Packet    take 20 mEq by mouth Daily with Breakfast.    OXYBUTYNIN  CHLORIDE (DITROPAN  XL ORAL)    take 15 mg by mouth Once a day.           Current facility-administered medications:  NS 10 mL injection  2 mL Intracatheter Q8HRS   NS 10 mL injection  2-6 mL Intracatheter Q1 MIN PRN   Chromium  Picolinate  Cap 1 Tab 1 Tab Oral 2x/day   citalopram  (CELEXA ) tablet  40 mg Oral Daily   enalapril  (VASOTEC ) tablet  2.5 mg Oral Daily   Simvastatin  (ZOCOR ) Tab 40 mg 40 mg Oral QPM   verapamil  extended release tablet  240 mg Oral Daily   cefTRIAXone  (ROCEPHIN ) 2 g in NS 50 mL IVPB 2 g Intravenous Q24H   SSIP insulin  R human (HUMULIN  R) 100 units/mL injection  4-12 Units Subcutaneous 4x/day PRN   acetaminophen  (TYLENOL ) tablet  650 mg Oral Q6H PRN   sennosides-docusate sodium  (SENOKOT-S) 8.6-50mg  per tablet  1 Tab Oral 2x/day   ondansetron  (ZOFRAN ) 2 mg/mL injection  4 mg Intravenous Q6H PRN   prochlorperazine  (COMPAZINE ) 5 mg/mL injection  5 mg Intravenous Q8H PRN   NS 10 mL injection  2 mL Intracatheter Q8HRS   NS 10 mL injection  2-6 mL Intracatheter Q1 MIN PRN   esomeprazole  (NEXIUM ) injection  40 mg Intravenous Daily   methylnaltrexone  (RELISTOR ) injection  0.3 mg/kg Subcutaneous Q6H   phenylephrine  (NEO-SYNEPHRINE) injection ---Cabinet Override      insulin  regular human (HUMULIN  R) 250 Units in NS 250 mL infusion 2 Units/hr Intravenous Continuous   dextrose  50% (0.5 g/mL) injection  12.5 g Intravenous Q1H PRN   HYDROmorphone  (DILAUDID ) 1 mg/mL injection  0.2 mg Intravenous Q2H PRN   LR premix infusion   Intravenous Continuous   phenylephrine  (NEO-SYNEPHRINE) 60 mg in NS 250 mL infusion 0.2 mcg/kg/min Intravenous Continuous       Past Surgical History   Procedure Date   . Hx hysterectomy    . Hx hand surgery      bilateral   . Hx foot surgery      right   . Hx other      left arm surgery   . Hx nephrectomy      with ureterectomy (left)   .  Hx other      left lung cancer removal       Family History   Problem Relation Age of Onset   . Diabetes Mother    . Diabetes Father    . Hypertension Mother    . Hypertension Father    . Coronary Artery Disease Mother    . Lung Cancer Sister      smoker       History   Substance Use Topics   . Smoking status: Former Games developer   . Smokeless tobacco: Not on file    Comment: Pt quit smoking  over 40 years, smoked 8 yrs   . Alcohol Use: Yes      wine rarely         PHYSICAL EXAMINATION: MUST comment on all Abnormal findings    Constitutional: Temperature: 36.3 C (97.3 F)  BP (Non-Invasive): 97/53 mmHg  Heart Rate: 71   SpO2-1: 100 %  CVP:    Swan:    Exam Temperature: 36.3 C (97.3 F)  Heart Rate: 71   BP (Non-Invasive): 97/53 mmHg  Respiratory Rate: 48   SpO2-1: 100 %  General: no distress  Eyes: Conjunctiva clear., Pupils equal and round.   HENT:Mouth mucous membranes dry.   Neck: supple, symmetrical, trachea midline  Lungs: Clear to auscultation bilaterally.   Cardiovascular: regular rate and rhythm, good peripheral pulses  Abdomen: Soft, ND, NTTP  Extremities: No cyanosis or edema  Skin: Skin warm and dry  Neurologic: GCS 3/4/6  Lymphatics: No lymphadenopathy  Psychiatric: Normal  Drains: Foley, JP, ileoconduit    Drips:  IV Drips   Medication Dose Frequency Last Rate   . insulin  regular human (HUMULIN  R) 250 Units in NS 250 mL infusion  2 Units/hr Continuous 2 Units/hr (12/31/10 1600)   . LR premix infusion    Continuous 100 mL/hr (12/31/10 1800)   . phenylephrine  (NEO-SYNEPHRINE) 60 mg in NS 250 mL infusion  0.2 mcg/kg/min Continuous 0.2 mcg/kg/min (12/31/10 1800)   . DISCONTD: LR premix infusion    Continuous 30 mL/hr (12/31/10 0643)   . DISCONTD: D5W 1/2 NS 1000 mL with potassium chloride  20 mEq premix infusion    Continuous     . DISCONTD: LR premix infusion    Continuous 50 mL/hr (12/31/10 1600)       GCS: 3/4/6        ETT Size:  8  Ventilator Settings:    Mode: SIMV(PRVC)/PS  Set VT: 600 mL  Set Rate: 12 Breaths Per Minute  Set PEEP: 5 cmH2O  Pressure Support: 12 cmH2O  FiO2: 50 %     Lines:  3/13 R IJ TL (placed in OR)  3/13 R a-line (placed in OR)    Wounds: Vaginal and abdominal incisions    Labs Ordered/ Reviewed (Please indicate ordered or reviewed)   Reviewed: Labs:  I have reviewed all lab results.    Radiology Tests Ordered/ Reviewed (Please indicate ordered or reviewed)      Reviewed:  CXR: NAP, good line placement without PTX (awaiting read)    ASSESSMENT & PLAN:    Active Hospital Problems   Diagnoses   . Metabolic acidosis   . Bladder cancer   . Type 2 diabetes mellitus       Christy Hale is a 70 y.o. female   POD#0  Open radical cystectomy with closure of vaginal cuff and urethrectomy   Creation of Ileal conduit diversion   Bilateral pelvic lymphadenectomy   Appendectomy  Exploratory laparotomy   Retrograde insertion of 6 X 20 cm double J ureteral stent into right ureter    Neuro  GCS 3/4/6  Dilaudid  0.6mg  q2 PRN for pain  Tylenol  650 q6 PRN  Off sedation  Celexa  daily    Resp  Mode: SIMV(PRVC)/PS  Set VT: 600 mL  Set Rate: 12 Breaths Per Minute  Set PEEP: 5 cmH2O  Pressure Support: 12 cmH2O  FiO2: 50 %  Following ABGs and base deficit. Non-anion gap metabolic acidosis with base deficit   CXR: NAP    CV  Monitor MAP and CVP.  HTN - Vasotec  and verapamil   HLD - Zocor   EKG unchanged from 12/20/10    Renal  Ieloconduit. Monitor output.  Pre-op creatinine 1.27, now 1.4  LR @ 150ml/hr  hypomag - replaced with 4g  Metabolic acidosis and large BD and anuria.  Urology aware.  Will cont to follow ABG and aggressive fluid resuscitation.     GI  NPO  Nexium , Senokot  Zofran  and COMpazine  PRN    Heme  H&H 9.7/28 (Pre-op on 3/2 12.1/36.1)  INR pending  Received 2 units PRBCs  SCDs. Hold lovenox until tomorrow.    Heme  WBC 16.0  Afebrile  Ceftriaxone  pre and post-op    Endo  Insulin  drip  Accuchecks q1 hour  FSB 125      Rocky Anette Ruddy, MD 12/31/2010, 4:58 PM    SICU Attending Note 01/01/2011    I performed a history and physical exam of the patient and discussed management with the resident. Please refer to SICU note dated 01/01/2011 I reviewed the resident's note and agree with the documented findings and plan of care except as noted with the following additions.    Critical Care Attestation    I was present at the bedside of this critically ill patient for 50 minutes exclusive of  procedures.  This patient suffers from failure or dysfunction of 5 system(s).  The care of this patient was in regard to managing (a) conditions(s) that has a high probability of sudden, clinically significant, or life-threatening deterioration and required a high degree of Attending Physician attention and direct involvement to intervene urgently. Data review and care planning was performed in direct proximity of the patient, examination was obviously performed in direct contact with the patient. All of this time was exclusive of procedure which will be documented elsewhere in the chart.    My critical care time involved full attention to the patients' condition and included:    Review of nursing notes and/or old charts  Review of medications, allergies, and vital signs  Documentation time  Consultant collaboration on findings and treatment options  Care, transfer of care, and discharge plans  Ordering, interpreting, and reviewing diagnostic studies/tab tests  Obtaining necessary history from family, EMS, nursing home staff and/or treating physicians    My critical care time did not include time spent teaching resident physician(s) or other services of resident physicians, or performing other reported procedures.  Total Critical Care Time: 50 minutes    Electronically Signed:   Millie Forde, MD 01/01/2011, 10:54 PM      Late entry for 12/31/10. I saw and examined the patient.  I reviewed the resident's note.  I agree with the findings and plan of care as documented in the resident's note.  Any exceptions/additions are edited/noted.    Marithza Malachi, MD 01/01/2011, 10:57 PM

## 2010-12-31 NOTE — Pharmacy IV to PO Conversion (Signed)
[facility] / Department of Psychologist, counselling Protocol Order: IV to PO Therapy Conversion     Christy Hale, Christy Hale  Date of Birth:  01/28/1941    The Medical Executive Committee at [facility] has granted pharmacists the ability to change therapy from IV to PO for medications which have good oral absorption in patients who can tolerate oral therapy.  The following order has been changed. The switch to oral therapy doesn't indicate that the medical severity or intensity of the patient has decreased.     Original Order: Esomeprazole (NEXIUM) 40 mg , IV, Daily    New Order: Esomeprazole (NEXIUM) 40 mg , Oral, Daily      Please contact the pharmacy if you have any questions.     Nicolasa Ducking, [facility]  12/31/2010  4:41 PM

## 2011-01-01 ENCOUNTER — Inpatient Hospital Stay (HOSPITAL_COMMUNITY): Payer: Medicare Other

## 2011-01-01 LAB — URINALYSIS, MICROSCOPIC
RBC'S: >182 /HPF — ABNORMAL HIGH (ref 0–4)
WBC'S: 112 /HPF — ABNORMAL HIGH (ref 0–6)

## 2011-01-01 LAB — ARTERIAL BLOOD GAS/LACTATE/CO-OX/LYTES (NA/K/CA/CL/GLUC) - ORS ONLY
%FIO2: 30 % (ref 21–100)
%FIO2: 30 % (ref 21–100)
%FIO2: 30 % (ref 21–100)
%FIO2: 32 % (ref 21–100)
%FIO2: 21 % (ref 21–100)
BASE DEFICIT: 6.2 mmol/L — ABNORMAL HIGH (ref 0.0–3.0)
BASE DEFICIT: 6.7 mmol/L — ABNORMAL HIGH (ref 0.0–3.0)
BASE DEFICIT: 7.5 mmol/L — ABNORMAL HIGH (ref 0.0–3.0)
BASE DEFICIT: 12 mmol/L — ABNORMAL HIGH (ref 0.0–3.0)
BASE DEFICIT: 8.4 mmol/L — ABNORMAL HIGH (ref 0.0–3.0)
BICARBONATE: 20.1 mmol/L (ref 18.0–29.0)
BICARBONATE: 19.7 mmol/L (ref 18.0–29.0)
BICARBONATE: 19.1 mmol/L (ref 18.0–29.0)
BICARBONATE: 15.5 mmol/L — CL (ref 18.0–29.0)
BICARBONATE: 18.4 mmol/L (ref 18.0–29.0)
CARBOXYHEMOGLOBIN: 0.7 % (ref 0.0–2.5)
CARBOXYHEMOGLOBIN: 1.1 % (ref 0.0–2.5)
CARBOXYHEMOGLOBIN: 0.6 % (ref 0.0–2.5)
CARBOXYHEMOGLOBIN: 2 % (ref 0.0–2.5)
CARBOXYHEMOGLOBIN: 1.4 % (ref 0.0–2.5)
CHLORIDE: 111 mmol/L (ref 96–111)
CHLORIDE: 107 mmol/L (ref 96–111)
CHLORIDE: 110 mmol/L (ref 96–111)
CHLORIDE: 106 mmol/L (ref 96–111)
GLUCOSE: 113 mg/dL — ABNORMAL HIGH (ref 70–105)
GLUCOSE: 192 mg/dL — ABNORMAL HIGH (ref 70–105)
GLUCOSE: 158 mg/dL — ABNORMAL HIGH (ref 70–105)
GLUCOSE: 241 mg/dL — ABNORMAL HIGH (ref 70–105)
GLUCOSE: 259 mg/dL — ABNORMAL HIGH (ref 70–105)
HEMATOCRIT: 24 % — ABNORMAL LOW (ref 33.5–45.2)
HEMATOCRIT: 25 % — ABNORMAL LOW (ref 33.5–45.2)
HEMATOCRIT: 33 % — ABNORMAL LOW (ref 33.5–45.2)
HEMATOCRIT: 36 % (ref 33.5–45.2)
HEMATOCRIT: 34 % (ref 33.5–45.2)
HEMOGLOBIN: 8.1 g/dL — ABNORMAL LOW (ref 12.0–16.0)
HEMOGLOBIN: 8.4 g/dL — ABNORMAL LOW (ref 12.0–16.0)
HEMOGLOBIN: 11 g/dL — ABNORMAL LOW (ref 12.0–16.0)
HEMOGLOBIN: 11.9 g/dL — ABNORMAL LOW (ref 12.0–16.0)
HEMOGLOBIN: 11.4 g/dL — ABNORMAL LOW (ref 12.0–16.0)
IONIZED CALCIUM: 1.28 mmol/L — ABNORMAL LOW (ref 1.30–1.46)
IONIZED CALCIUM: 1.23 mmol/L — ABNORMAL LOW (ref 1.30–1.46)
IONIZED CALCIUM: 1.2 mmol/L — ABNORMAL LOW (ref 1.30–1.46)
IONIZED CALCIUM: 1.24 mmol/L — ABNORMAL LOW (ref 1.30–1.46)
IONIZED CALCIUM: 1.14 mmol/L — ABNORMAL LOW (ref 1.30–1.46)
LACTATE: 2.8 mmol/L — ABNORMAL HIGH (ref <1.3)
LACTATE: 1.9 mmol/L — ABNORMAL HIGH (ref <1.3)
LACTATE: 2.7 mmol/L — ABNORMAL HIGH (ref <1.3)
LACTATE: 1.2 mmol/L (ref <1.3)
LACTATE: 0.8 mmol/L (ref <1.3)
MET-HEMOGLOBIN: 0.9 % (ref 0.0–3.0)
MET-HEMOGLOBIN: 1.5 % (ref 0.0–3.0)
MET-HEMOGLOBIN: 1.2 % (ref 0.0–3.0)
MET-HEMOGLOBIN: 1.4 % (ref 0.0–3.0)
MET-HEMOGLOBIN: 1.6 % (ref 0.0–3.0)
O2CT: 11.5 — ABNORMAL LOW (ref 15.7–21.6)
O2CT: 11.7 — ABNORMAL LOW (ref 15.7–21.6)
O2CT: 15.2 — ABNORMAL LOW (ref 15.7–21.6)
O2CT: 16.1 (ref 15.7–21.6)
O2CT: 15.6 — ABNORMAL LOW (ref 15.7–21.6)
OXYHEMOGLOBIN: 96.2 % (ref 85.0–98.0)
OXYHEMOGLOBIN: 95.3 % (ref 85.0–98.0)
OXYHEMOGLOBIN: 95.8 % (ref 85.0–98.0)
PCO2: 17 mmHg — CL (ref 33.1–43.1)
PCO2: 17 mmHg — CL (ref 33.1–43.1)
PCO2: 19 mmHg — CL (ref 33.1–43.1)
PCO2: 31 mmHg — ABNORMAL LOW (ref 33.1–43.1)
PCO2: 30 mmHg — ABNORMAL LOW (ref 33.1–43.1)
PH: 7.55 — ABNORMAL HIGH (ref 7.350–7.450)
PH: 7.54 — ABNORMAL HIGH (ref 7.350–7.450)
PH: 7.48 — ABNORMAL HIGH (ref 7.350–7.450)
PH: 7.26 — ABNORMAL LOW (ref 7.350–7.450)
PH: 7.34 — ABNORMAL LOW (ref 7.350–7.450)
PIO2/FIO2 RATIO: 570 (ref >300)
PIO2/FIO2 RATIO: 497 (ref >300)
PIO2/FIO2 RATIO: 513 (ref >300)
PO2: 171 mmHg — ABNORMAL HIGH (ref 72–100)
PO2: 149 mmHg — ABNORMAL HIGH (ref 72–100)
PO2: 154 mmHg — ABNORMAL HIGH (ref 72–100)
PO2: 110 mmHg — ABNORMAL HIGH (ref 72–100)
PO2: 143 mmHg — ABNORMAL HIGH (ref 72–100)
SODIUM: 136 mmol/L (ref 136–145)
SODIUM: 135 mmol/L — ABNORMAL LOW (ref 136–145)
SODIUM: 137 mmol/L (ref 136–145)
SODIUM: 135 mmol/L — ABNORMAL LOW (ref 136–145)
SODIUM: 135 mmol/L — ABNORMAL LOW (ref 136–145)
WHOLE BLOOD K+: 4 mmol/L (ref 3.5–5.0)
WHOLE BLOOD K+: 4.4 mmol/L (ref 3.5–5.0)
WHOLE BLOOD K+: 3.8 mmol/L (ref 3.5–5.0)
WHOLE BLOOD K+: 4.4 mmol/L (ref 3.5–5.0)
WHOLE BLOOD K+: 3.8 mmol/L (ref 3.5–5.0)

## 2011-01-01 LAB — PERFORM POC WHOLE BLOOD GLUCOSE
GLUCOSE, POINT OF CARE: 110 mg/dL — ABNORMAL HIGH (ref 70–105)
GLUCOSE, POINT OF CARE: 127 mg/dL — ABNORMAL HIGH (ref 70–105)
GLUCOSE, POINT OF CARE: 132 mg/dL — ABNORMAL HIGH (ref 70–105)
GLUCOSE, POINT OF CARE: 153 mg/dL — ABNORMAL HIGH (ref 70–105)
GLUCOSE, POINT OF CARE: 196 mg/dL — ABNORMAL HIGH (ref 70–105)
GLUCOSE, POINT OF CARE: 213 mg/dL — ABNORMAL HIGH (ref 70–105)
GLUCOSE, POINT OF CARE: 229 mg/dL — ABNORMAL HIGH (ref 70–105)
GLUCOSE, POINT OF CARE: 93 mg/dL (ref 70–105)

## 2011-01-01 LAB — URINALYSIS, MACROSCOPIC
BILIRUBIN: NEGATIVE
GLUCOSE: 150 mg/dL — AB
KETONES: 10 mg/dL — AB
NITRITE: NEGATIVE
PH URINE: 8 (ref 5.0–8.0)
SPECIFIC GRAVITY, URINE: 1.008 (ref 1.005–1.030)
UROBILINOGEN: NORMAL mg/dL

## 2011-01-01 LAB — CBC/DIFF
BASOPHILS: 0 % (ref 0–1)
BASOPHILS: 0 % (ref 0–1)
BASOS ABS: 0.003 THOU/uL (ref 0.0–0.2)
BASOS ABS: 0.005 THOU/uL (ref 0.0–0.2)
EOS ABS: 0.013 THOU/uL — ABNORMAL LOW (ref 0.1–0.3)
EOS ABS: 0.028 THOU/uL — ABNORMAL LOW (ref 0.1–0.3)
EOSINOPHIL: 0 % — ABNORMAL LOW (ref 1–6)
EOSINOPHIL: 0 % — ABNORMAL LOW (ref 1–6)
HCT: 22.3 % — ABNORMAL LOW (ref 33.5–45.2)
HCT: 33.4 % — ABNORMAL LOW (ref 33.5–45.2)
HGB: 11.4 g/dL — ABNORMAL LOW (ref 11.5–15.2)
HGB: 7.7 g/dL — ABNORMAL LOW (ref 11.5–15.2)
LYMPHOCYTES: 14 % — ABNORMAL LOW (ref 20–45)
LYMPHOCYTES: 8 % — ABNORMAL LOW (ref 20–45)
LYMPHS ABS: 1.45 THOU/uL (ref 1.0–4.8)
LYMPHS ABS: 1.61 THOU/uL (ref 1.0–4.8)
MCH: 29.8 pg (ref 27.4–33.0)
MCHC: 34.1 g/dL (ref 31.6–35.5)
MCHC: 34.6 g/dL (ref 31.6–35.5)
MCV: 86.2 fL (ref 82.0–99.0)
MONOCYTES: 8 % (ref 4–13)
MONOCYTES: 8 % (ref 4–13)
MONOS ABS: 0.952 THOU/uL — ABNORMAL HIGH (ref 0.1–0.9)
MONOS ABS: 1.42 THOU/uL — ABNORMAL HIGH (ref 0.1–0.9)
MPV: 8.1 FL (ref 7.4–10.4)
NRBC'S: 0 /100{WBCs}
PLATELET COUNT: 100 THOU/uL — ABNORMAL LOW (ref 140–450)
PLATELET COUNT: 125 THOU/uL — ABNORMAL LOW (ref 140–450)
PMN ABS: 15.5 THOU/uL — ABNORMAL HIGH (ref 1.5–7.7)
PMN ABS: 8.78 THOU/uL — ABNORMAL HIGH (ref 1.5–7.7)
PMN'S: 78 % — ABNORMAL HIGH (ref 40–75)
PMN'S: 84 % — ABNORMAL HIGH (ref 40–75)
RBC: 2.59 MIL/uL — ABNORMAL LOW (ref 3.84–5.04)
RDW: 12.7 % (ref 10.2–14.0)
RDW: 13.4 % (ref 10.2–14.0)
WBC: 11.4 THOU/uL — ABNORMAL HIGH (ref 3.5–11.0)
WBC: 18.4 THOU/uL — ABNORMAL HIGH (ref 3.5–11.0)

## 2011-01-01 LAB — H & H
HCT: 17.7 % — ABNORMAL LOW (ref 33.5–45.2)
HCT: 30 % — ABNORMAL LOW (ref 33.5–45.2)
HGB: 6.1 g/dL — CL (ref 11.5–15.2)
HGB: 10.3 g/dL — ABNORMAL LOW (ref 11.5–15.2)

## 2011-01-01 LAB — PHOSPHORUS
PHOSPHORUS: 1.4 mg/dL — CL (ref 2.4–4.7)
PHOSPHORUS: 3.7 mg/dL (ref 2.4–4.7)

## 2011-01-01 LAB — ARTERIAL BLOOD GAS/K/CA/CO-OX
%FIO2: 30 % (ref 21–100)
BASE DEFICIT: 7.3 mmol/L — ABNORMAL HIGH (ref 0.0–3.0)
BICARBONATE: 19.3 mmol/L (ref 18.0–29.0)
CARBOXYHEMOGLOBIN: 1.4 % (ref 0.0–2.5)
HEMATOCRIT: 20 % — ABNORMAL LOW (ref 33.5–45.2)
HEMOGLOBIN: 6.5 g/dL — CL (ref 12.0–16.0)
IONIZED CALCIUM: 1.25 mmol/L — ABNORMAL LOW (ref 1.30–1.46)
MET-HEMOGLOBIN: 0.9 % (ref 0.0–3.0)
O2CT: 9.2 — ABNORMAL LOW (ref 15.7–21.6)
OXYHEMOGLOBIN: 97 % (ref 85.0–98.0)
PCO2: 28 mm Hg — ABNORMAL LOW (ref 33.1–43.1)
PH: 7.39 (ref 7.350–7.450)
PIO2/FIO2 RATIO: 417 (ref >300)
PO2: 125 mm Hg — ABNORMAL HIGH (ref 72–100)
WHOLE BLOOD K+: 4 mmol/L (ref 3.5–5.0)

## 2011-01-01 LAB — BASIC METABOLIC PANEL
ANION GAP: 8 mmol/L (ref 5–16)
BUN/CREAT RATIO: 13 (ref 6–22)
BUN: 24 mg/dL — ABNORMAL HIGH (ref 6–20)
CALCIUM: 8.2 mg/dL — ABNORMAL LOW (ref 8.5–10.4)
CARBON DIOXIDE: 15 mmol/L — ABNORMAL LOW (ref 22–32)
CHLORIDE: 113 mmol/L — ABNORMAL HIGH (ref 96–111)
CREATININE: 1.79 mg/dL — ABNORMAL HIGH (ref 0.49–1.10)
ESTIMATED GLOMERULAR FILTRATION RATE: 28 ml/min/1.73m2 — ABNORMAL LOW (ref >59)
GLUCOSE,NONFAST: 107 mg/dL (ref 65–139)
POTASSIUM: 4.7 mmol/L (ref 3.5–5.1)
SODIUM: 136 mmol/L (ref 136–145)

## 2011-01-01 LAB — CREATININE BODY FLUID: CREATININE, FLUID: 3.5 mg/dL

## 2011-01-01 LAB — POTASSIUM: POTASSIUM: 4 mmol/L (ref 3.5–5.1)

## 2011-01-01 LAB — MAGNESIUM: MAGNESIUM: 2.1 mg/dL (ref 1.7–2.5)

## 2011-01-01 LAB — CHLORIDE,RANDOM URINE: CHLORIDE, URINE RAND: 117 mmol/L — AB

## 2011-01-01 LAB — ARTERIAL BLOOD GAS/LACTATE/CO-OX/LYTES (NA/K/CA/CL/GLUC)
CHLORIDE: 105 mmol/L (ref 96–111)
OXYHEMOGLOBIN: 96.1 % (ref 85.0–98.0)
PIO2/FIO2 RATIO: 344 (ref >300)

## 2011-01-01 LAB — PROTEIN, TOTAL URINE, RANDOM: PROTEIN, URINE, RANDOM: 626 mg/dL — ABNORMAL HIGH (ref <30)

## 2011-01-01 LAB — CREATININE URINE, RANDOM: CREATININE, UR RAND: <10 mg/dL — AB

## 2011-01-01 LAB — CALCIUM,RANDOM URINE: CALCIUM, URINE RAND: 7.9 mg/dl — AB

## 2011-01-01 LAB — SODIUM, RANDOM URINE: SODIUM, URINE RAND: 138 mmol/L — AB

## 2011-01-01 LAB — POTASSIUM,RANDOM URINE: POTASSIUM, UR RAND: 6 mmol/L — AB

## 2011-01-01 MED ORDER — POTASSIUM CHLORIDE 20 MEQ/50 ML IN STERILE WATER INTRAVENOUS PIGGYBACK
20.0000 meq | INJECTION | Freq: Once | INTRAVENOUS | Status: DC
Start: 2011-01-01 — End: 2011-01-02
  Administered 2011-01-01: 0 meq via INTRAVENOUS
  Filled 2011-01-01: qty 50

## 2011-01-01 MED ORDER — SODIUM PHOSPHATE 3 MMOL/ML INTRAVENOUS SOLUTION
30.0000 mmol | Freq: Once | INTRAVENOUS | Status: AC
Start: 2011-01-01 — End: 2011-01-01
  Administered 2011-01-01: 30 mmol via INTRAVENOUS
  Filled 2011-01-01: qty 10

## 2011-01-01 MED ORDER — POTASSIUM CHLORIDE 20 MEQ/50 ML IN STERILE WATER INTRAVENOUS PIGGYBACK
20.0000 meq | INJECTION | Freq: Once | INTRAVENOUS | Status: AC
Start: 2011-01-02 — End: 2011-01-02
  Administered 2011-01-02: 20 meq via INTRAVENOUS

## 2011-01-01 MED ORDER — DIPHENHYDRAMINE 50 MG/ML INJECTION SOLUTION
6.25 mg | Freq: Four times a day (QID) | INTRAMUSCULAR | Status: DC | PRN
Start: 2011-01-01 — End: 2011-01-02

## 2011-01-01 MED ORDER — DEXTROSE 5 % IN WATER (D5W) INTRAVENOUS SOLUTION
15.0000 mmol | Freq: Once | INTRAVENOUS | Status: DC
Start: 2011-01-01 — End: 2011-01-07
  Administered 2011-01-01: 0 mmol via INTRAVENOUS
  Filled 2011-01-01: qty 5

## 2011-01-01 MED ORDER — FUROSEMIDE 10 MG/ML INJECTION SOLUTION
20.0000 mg | Freq: Once | INTRAMUSCULAR | Status: AC
Start: 2011-01-01 — End: 2011-01-01
  Administered 2011-01-01: 20 mg via INTRAVENOUS
  Filled 2011-01-01: qty 2

## 2011-01-01 MED ORDER — CALCIUM GLUCONATE 100 MG/ML (10 %) INTRAVENOUS SOLUTION
1000.0000 mg | Freq: Once | INTRAVENOUS | Status: AC
Start: 2011-01-01 — End: 2011-01-01
  Administered 2011-01-01: 1000 mg via INTRAVENOUS
  Filled 2011-01-01: qty 10

## 2011-01-01 MED ORDER — GRANISETRON HCL 1 MG/ML (1 ML) INTRAVENOUS SOLUTION
1.00 mg | Freq: Two times a day (BID) | INTRAVENOUS | Status: DC | PRN
Start: 2011-01-01 — End: 2011-01-01
  Administered 2011-01-01: 1 mg via INTRAVENOUS
  Filled 2011-01-01 (×3): qty 1

## 2011-01-01 MED ORDER — PRENATAL VIT-IRON-FOLATE TAB WRAPPER
1.0000 | ORAL_TABLET | Freq: Every day | Status: DC
Start: 2011-01-01 — End: 2011-01-02
  Administered 2011-01-01 – 2011-01-02 (×2): 1 via ORAL
  Filled 2011-01-01 (×2): qty 1

## 2011-01-01 MED ORDER — SODIUM BICARBONATE 1 MEQ/ML (8.4 %) INTRAVENOUS SOLUTION
INTRAVENOUS | Status: DC
Start: 2011-01-01 — End: 2011-01-02
  Filled 2011-01-01: qty 150

## 2011-01-01 MED ORDER — ALBUMIN, HUMAN 5 % INTRAVENOUS SOLUTION
25.0000 g | Freq: Once | INTRAVENOUS | Status: AC
Start: 2011-01-01 — End: 2011-01-01
  Administered 2011-01-01: 25 g via INTRAVENOUS

## 2011-01-01 MED ORDER — NALOXONE 0.4 MG/ML INJECTION SOLUTION
0.40 mg | Freq: Once | INTRAMUSCULAR | Status: AC | PRN
Start: 2011-01-01 — End: 2011-01-01

## 2011-01-01 MED ORDER — INSULIN GLARGINE (U-100) 100 UNIT/ML SUBCUTANEOUS SOLUTION
10.00 [IU] | Freq: Two times a day (BID) | SUBCUTANEOUS | Status: DC
Start: 2011-01-01 — End: 2011-01-02
  Administered 2011-01-01 – 2011-01-02 (×3): 10 [IU] via SUBCUTANEOUS
  Filled 2011-01-01 (×3): qty 10

## 2011-01-01 MED ORDER — SODIUM CHLORIDE 0.9 % INTRAVENOUS SOLUTION
1.00 g | INTRAVENOUS | Status: AC
Start: 2011-01-01 — End: 2011-01-03
  Administered 2011-01-01 – 2011-01-03 (×3): 1 g via INTRAVENOUS
  Filled 2011-01-01 (×3): qty 10

## 2011-01-01 MED ORDER — CALCIUM GLUCONATE 100 MG/ML (10 %) INTRAVENOUS SOLUTION
1000.0000 mg | Freq: Once | INTRAVENOUS | Status: AC
Start: 2011-01-02 — End: 2011-01-02
  Administered 2011-01-02: 1000 mg via INTRAVENOUS
  Filled 2011-01-01: qty 10

## 2011-01-01 MED ORDER — ONDANSETRON HCL (PF) 4 MG/2 ML INJECTION SOLUTION
4.00 mg | Freq: Four times a day (QID) | INTRAMUSCULAR | Status: DC | PRN
Start: 2011-01-01 — End: 2011-01-01

## 2011-01-01 MED ADMIN — HYDROmorphone 50 mg/50 mL in 0.9 % sodium chloride injection: 1 | INTRAVENOUS | NDC 61553024296

## 2011-01-01 MED FILL — HYDROmorphone 50 mg/50 mL in 0.9 % sodium chloride injection: 1.0000 | INTRAMUSCULAR | Qty: 1 | Status: AC

## 2011-01-01 MED FILL — albumin, human 5 % intravenous solution: INTRAVENOUS | Qty: 500 | Status: AC

## 2011-01-01 NOTE — Consults (Addendum)
Examined patient with call stating she is anuric. KUB reviewed, stents appear in proper position. There is no urine in collection bag or tubing, however, when taking off the appliance and examining the urostomy, there is approx 5-10 cc of bloody urine in the stoma that is visible. This was conveyed to SICU team and to nurse.       I saw and examined the patient.  I reviewed the resident's note.  I agree with the findings and plan of care as documented in the resident's note.  Any exceptions/additions are edited/noted.    Cyril Loosen, MD 01/01/2011, 5:12 AM

## 2011-01-01 NOTE — Nurses Notes (Signed)
Patient Renal ultrasound completed at bedside. Urology present during exam. Dr. Marzetta Board checked patient ileal conduit and used red robin catheter to assess. Following patient became nauseated and had 2 large episodes of emesis. Nausea was unrelieved by zofran. Notified SICU and received orders for 2 nd dose zofran to be given. Patient no longer vomitting but continues to feel some nausea. NGT to lisx throughout. Nephrology at bedside and spoke with patient. Urine specimen sent. Patient now has drained over 100 cc/hr for 2 hours from ileal conduit.

## 2011-01-01 NOTE — Nurses Notes (Signed)
SBP 90's MAP 55-58. Dr. Owens Loffler notified. Orders received for 500 cc Albumin. Will continue to monitor.

## 2011-01-01 NOTE — Nurses Notes (Signed)
 500 cc Albumin  given.  SBP still 90's and MAP still  55-58. Dr. Rice notified. Orders received to give another 500 cc Albumin . Will continue to monitor.

## 2011-01-01 NOTE — Consults (Addendum)
[facility]      NEPHROLOGY CONSULT  Initial Consult Note      Service: Urology  Requesting MD: Caryl Ada, MD  Date of Service:  01/01/2011    Information Obtained from: patient and history reviewed via medical record  Chief Complaint:  70 y.o. female with worsening renal function.     HPI/Discussion:  This is a 70 y/o female with PMH significant for TCC of the left renal pelvis, s/p left nephroureterectomy four years ago, now admitted to hospital with bladder mass s/p Radical cystectomy with creation of an ileoconduit diversion on 12/31/10, now found to have deteriorating kidney functions. Her creatinine was 1.27 on 12/20/10 which on repeat after surgery found to be 1.4, later increased to 1.79 mg/dl. After surgery her respiratory support was on SIMV/PRC, which later weaned to CPAP and now she is on NC. After coming from OR her blood pressure was 86/51 mm of HG which later improved. Her pathology specimen has been sent.   Her other co-morbid conditions are insulin dependent DM and hypertension. Per patient she has been compliant with her medications in past. There is no recent change in medications. She uses pain medications once or twice a week for her abdominal pain. Per patient other than his left nephrectomy she does not have other kidney problem. Up to patient knowledge her kidney functions were normal before coming to hospital. There is no family history of kidney problem in family.           Past Medical History   Diagnosis Date   . Hypertension    . Ulcer disease    . Anemia    . Lung cancer      left lung, unknown type   . Arthritis    . Diabetes mellitus    . Hyperlipidemia    . History of transfusion      no reaction   . GERD (gastroesophageal reflux disease)      hx of, no longer takes meds   . Kidney disease      s/p nephrectomy due to cancer   . Type 2 diabetes mellitus      dx 20 + yrs ago, does not check fs at home   . cancer       s/p renal cancer, lung cancer, skin cancer, and now with bladder cancer     Past Surgical History   Procedure Date   . Hx hysterectomy    . Hx hand surgery      bilateral   . Hx foot surgery      right   . Hx other      left arm surgery   . Hx nephrectomy      with ureterectomy (left)   . Hx other      left lung cancer removal         Inpatient Medications:    Current facility-administered medications:  sodium phosphate 15 mmol in D5W 100 mL IVPB 15 mmol Intravenous Once   prenatal vitamin-iron fumarate-folic acid  (NATALCARE PLUS) 27-0.8mg  per tablet  1 Tab Oral Daily   potassium chloride 20 mEq in SW 50 mL premix infusion  20 mEq Intravenous Once   insulin glargine (LANTUS) 100 units/mL injection  10 Units Subcutaneous 2x/day   citalopram (CELEXA) tablet  40 mg Oral Daily   cefTRIAXone (ROCEPHIN) 2 g in NS 50 mL IVPB 2 g Intravenous Q24H   SSIP insulin R human (HUMULIN R) 100 units/mL injection  4-12 Units Subcutaneous 4x/day PRN   acetaminophen (TYLENOL) tablet  650 mg Oral Q6H PRN   ondansetron (ZOFRAN) 2 mg/mL injection  4 mg Intravenous Q6H PRN   NS 10 mL injection  2 mL Intracatheter Q8HRS   NS 10 mL injection  2-6 mL Intracatheter Q1 MIN PRN   phenylephrine (NEO-SYNEPHRINE) injection ---Cabinet Override      simvastatin (ZOCOR) tablet  40 mg Oral QPM   esomeprazole (NEXIUM) capsule  40 mg Oral QAM   dexmedetomidine (PRECEDEX) 200 mcg in NS 50mL (tot vol) infusion  0.2 mcg/kg/hr Intravenous Continuous   HYDROmorphone (DILAUDID) 1 mg/mL injection  0.4 mg Intravenous Q2H PRN   LR premix infusion   Intravenous Continuous   labetalol (TRANDATE) 5 mg/mL injection  5 mg Intravenous Q2H PRN   hydrALAZINE (APRESOLINE) injection 10 mg 10 mg Intravenous Q4H PRN   sennosides (SENOKOT) 8.8mg  per 5mL oral liquid  5 mL Oral 2x/day   docusate sodium (COLACE) 10mg  per mL oral liquid  50 mg Oral 2x/day     No Known Allergies    Diet Order:  NPO  Nutrition:  Orders Placed This Encounter   Procedure    . MNT PROTOCOL FOR DIETITIAN       IV Fluids, Meds, and Drips:      dexmedetomidine (PRECEDEX) 200 mcg in NS 50mL (tot vol) infusion Last Rate: Stopped (01/01/11 1000)   LR Last Rate: 150 mL/hr (01/01/11 0112)   DISCONTD: LR Last Rate: 30 mL/hr (12/31/10 6295)   DISCONTD: D5W 1/2 NS with potassium chloride    DISCONTD: LR Last Rate: 50 mL/hr (12/31/10 1600)   DISCONTD: insulin regular human (HUMULIN R) 250 units in NS infusion Last Rate: Stopped (01/01/11 1033)   DISCONTD: LR Last Rate: 100 mL/hr (12/31/10 1800)   DISCONTD: phenylephrine (NEO-SYNEPHRINE) 60 mg in NS or D5W infusion Last Rate: Stopped (12/31/10 1800)         Family History  Family History   Problem Relation Age of Onset   . Diabetes Mother    . Diabetes Father    . Hypertension Mother    . Hypertension Father    . Coronary Artery Disease Mother    . Lung Cancer Sister      smoker         Social History  History   Social History   . Marital Status: Married     Spouse Name: N/A     Number of Children: N/A   . Years of Education: N/A   Occupational History   . Homemaker    Social History Main Topics   . Smoking status: Former Games developer   . Smokeless tobacco: Not on file    Comment: Pt quit smoking over 40 years, smoked 8 yrs   . Alcohol Use: Yes      wine rarely   . Drug Use: No   . Sexually Active: Not on file   Other Topics Concern   . Ability To Walk 1 Flight Of Steps Without Sob/cp Yes   . Ability To Do Own Adl's Yes     has help with household chores, minimal daily activity, denies cp/sob   Social History Narrative   . No narrative on file         ROS: Other than ROS in the HPI, all other systems were negative.    Exam:  Temperature: 37.2 C (99 F)  Heart Rate: 83   BP (Non-Invasive): 144/65 mmHg  Respiratory Rate: 22  SpO2-1: 98 %  Pain Score: 0  General: Laying on the bed comfortably with oxygen by NC, NG tube in place and right sided ileo conduit diversion.  Eyes: Conjunctiva clear.   HENT:Head atraumatic and normocephalic, right sided central line is in place.  Neck: No JVD or thyromegaly or lymphadenopathy  Lungs: Clear to auscultation bilaterally. , with basal crepitation.  Cardiovascular: regular rate and rhythm  Abdomen: Soft, non-tender, Bowel sounds normal, right sided ileo conduit diversion, midline post surgical dressing above the scar.  Extremities: No cyanosis or edema  Skin: Skin warm and dry  Neurologic: Grossly normal  Lymphatics: No lymphadenopathy  Psychiatric: Normal    I/O:  I/O last 24 hours:    Intake/Output Summary (Last 24 hours) at 01/01/11 1358  Last data filed at 01/01/11 1200   Gross per 24 hour   Intake 7219.21 ml   Output    900 ml   Net 6319.21 ml       I/O current shift:  03/14 0800 - 03/14 1559  In: 1684.45 [P.O.:140; I.V.:1544.45]  Out: 45 [Urine:5; Drains:40]    Labs:  Lab Results for Last 24 Hours:    Results for orders placed during the hospital encounter of 12/31/10 (from the past 24 hour(s))   BLOOD GAS/COOX/LYTES/LAC/HCT (TEMP COMP)   Component Value Range   . %FIO2 68  21 - 100 (%)   . PH 7.290 (*) 7.350 - 7.450    . PCO2 36.0  33.1 - 43.1 (mm Hg)   . PO2 283 (*) 72 - 100 (mm Hg)   . BICARBONATE 18.3  18.0 - 29.0 (mmol/L)   . BASE EXCESS Test Not Performed  0.0 - 3.0 (mmol/L)   . BASE DEFICIT 8.5 (*) 0.0 - 3.0 (mmol/L)   . TEMPERATURE, COMP 36.5  15.0 - 40.0 (C)   . PHT 7.300 (*) 7.350 - 7.450    . CO2T 35.0  33.1 - 43.1 (MM HG)   . PO2T 281 (*) 72 - 100 (mm Hg)   . SPECIMEN TYPE ARTERIAL     . PIO2/FIO2 RATIO 416  >300    . HEMOGLOBIN 9.6 (*) 12.0 - 16.0 (g/dL)   . OXYHEMOGLOBIN 96.6  85.0 - 98.0 (%)   . CARBOXYHEMOGLOBIN 1.3  0.0 - 2.5 (%)   . MET-HEMOGLOBIN 1.3  0.0 - 3.0 (%)   . O2CT 13.8 (*) 15.7 - 21.6    . RTCX SPEC TYPE ARTERIAL     . WHOLE BLOOD K+ 4.1  3.5 - 5.0 (mmol/L)   . IONIZED CALCIUM 1.40  1.30 - 1.46 (mmol/L)   . SODIUM 137  136 - 145 (mmol/L)   . LACTATE 1.9 (*) <1.3 (mmol/L)   . CHLORIDE 111  96 - 111 (mmol/L)    . GLUCOSE 159 (*) 70 - 105 (mg/dL)   . HEMATOCRIT 29 (*) 33.5 - 45.2 (%)   CBC/DIFF   Component Value Range   . WBC 16.0 (*) 3.5 - 11.0 (THOU/uL)   . RBC 3.18 (*) 3.84 - 5.04 (MIL/uL)   . HGB 9.7 (*) 11.5 - 15.2 (g/dL)   . HCT 28.0 (*) 33.5 - 45.2 (%)   . MCV 88.1  82.0 - 99.0 (fL)   . MCH 30.4  27.4 - 33.0 (pg)   . MCHC 34.5  31.6 - 35.5 (g/dL)   . RDW 12.5  10.2 - 14.0 (%)   . PLATELET COUNT 156  140 - 450 (THOU/uL)   . MPV 7.3 (*) 7.4 -  10.4 (FL)   . PMN'S 73  40 - 75 (%)   . PMN ABS 11.680 (*) 1.5 - 7.7 (THOU/uL)   . LYMPHOCYTES 18 (*) 20 - 45 (%)   . LYMPHS ABS 2.880  1.0 - 4.8 (THOU/uL)   . MONOCYTES 7  4 - 13 (%)   . MONOS ABS 1.120 (*) 0.1 - 0.9 (THOU/uL)   . EOSINOPHIL 0 (*) 1 - 6 (%)   . EOS ABS 0.000 (*) 0.1 - 0.3 (THOU/uL)   . BASOPHILS 0  0 - 1 (%)   . BASOS ABS 0.000  0.0 - 0.2 (THOU/uL)   . BANDS 2  0 - 5 (%)   . BANDS ABS 0.320  0.0 - 0.5 (THOU/uL)   . RBC MORPHOLOGY ANISOCYTOSIS-1+     BASIC METABOLIC PANEL, NON-FASTING   Component Value Range   . SODIUM 139  136 - 145 (mmol/L)   . POTASSIUM 4.4  3.5 - 5.1 (mmol/L)   . CHLORIDE 115 (*) 96 - 111 (mmol/L)   . CARBON DIOXIDE 16 (*) 22 - 32 (mmol/L)   . ANION GAP 8  5 - 16 (mmol/L)   . CREATININE 1.40 (*) 0.49 - 1.10 (mg/dL)   . ESTIMATED GLOMERULAR FILTRATION RATE 37 (*) >59 (ml/min/1.28m2)   . GLUCOSE,NONFAST 126  65 - 139 (mg/dL)   . BUN 23 (*) 6 - 20 (mg/dL)   . BUN/CREAT RATIO 16  6 - 22    . CALCIUM 8.8  8.5 - 10.4 (mg/dL)   PT/INR   Component Value Range   . PROTHROMBIN TIME 11.7 (*) 9.1 - 11.5 (Sec)   . INR 1.1  0.8 - 1.2    MAGNESIUM   Component Value Range   . MAGNESIUM 1.4 (*) 1.7 - 2.5 (mg/dL)   PHOSPHORUS   Component Value Range   . PHOSPHORUS 4.6  2.4 - 4.7 (mg/dL)   ARTERIAL BLOOD GAS/K+/ICA++/COOX   Component Value Range   . WHOLE BLOOD K+ 4.3  3.5 - 5.0 (mmol/L)   . IONIZED CALCIUM 1.30  1.30 - 1.46 (mmol/L)   . %FIO2 50  21 - 100 (%)   . PH 7.300 (*) 7.350 - 7.450    . PCO2 30.0 (*) 33.1 - 43.1 (mm Hg)    . PO2 216 (*) 72 - 100 (mm Hg)   . BICARBONATE 16.8 (*) 18.0 - 29.0 (mmol/L)   . BASE EXCESS Test Not Performed  0.0 - 3.0 (mmol/L)   . BASE DEFICIT 10.5 (*) 0.0 - 3.0 (mmol/L)   . SPECIMEN TYPE ARTERIAL     . PIO2/FIO2 RATIO 432  >300    . HEMOGLOBIN 9.6 (*) 12.0 - 16.0 (g/dL)   . OXYHEMOGLOBIN 98.0  85.0 - 98.0 (%)   . CARBOXYHEMOGLOBIN 0.0  0.0 - 2.5 (%)   . MET-HEMOGLOBIN 0.3  0.0 - 3.0 (%)   . O2CT 13.8 (*) 15.7 - 21.6    . RTCX SPEC TYPE ARTERIAL     . HEMATOCRIT 29 (*) 33.5 - 45.2 (%)   POCT WHOLE BLOOD GLUCOSE   Component Value Range   . GLUCOSE, POINT OF CARE 125 (*) 70 - 105 (mg/dL)   POCT WHOLE BLOOD GLUCOSE   Component Value Range   . GLUCOSE, POINT OF CARE 121 (*) 70 - 105 (mg/dL)   POCT WHOLE BLOOD GLUCOSE   Component Value Range   . GLUCOSE, POINT OF CARE 128 (*) 70 - 105 (mg/dL)   BLOOD GAS/COOX/LYTES/LAC/HCT (PICU/NICU ONLY)  Component Value Range   . %FIO2 30  21 - 100 (%)   . PH 7.540 (*) 7.350 - 7.450    . PCO2 17.0 (*) 33.1 - 43.1 (mm Hg)   . PO2 192 (*) 72 - 100 (mm Hg)   . BICARBONATE 20.0  18.0 - 29.0 (mmol/L)   . BASE EXCESS Test Not Performed  0.0 - 3.0 (mmol/L)   . BASE DEFICIT 6.4 (*) 0.0 - 3.0 (mmol/L)   . SPECIMEN TYPE ARTERIAL     . PIO2/FIO2 RATIO 640  >300    . HEMOGLOBIN 9.3 (*) 12.0 - 16.0 (g/dL)   . OXYHEMOGLOBIN 97.6  85.0 - 98.0 (%)   . CARBOXYHEMOGLOBIN 0.3  0.0 - 2.5 (%)   . MET-HEMOGLOBIN 0.5  0.0 - 3.0 (%)   . O2CT 13.2 (*) 15.7 - 21.6    . RTCX SPEC TYPE ARTERIAL     . WHOLE BLOOD K+ 4.2  3.5 - 5.0 (mmol/L)   . IONIZED CALCIUM 1.22 (*) 1.30 - 1.46 (mmol/L)   . SODIUM 137  136 - 145 (mmol/L)   . LACTATE 3.1 (*) <1.3 (mmol/L)   . CHLORIDE 112 (*) 96 - 111 (mmol/L)   . GLUCOSE 158 (*) 70 - 105 (mg/dL)   . HEMATOCRIT 28 (*) 33.5 - 45.2 (%)   BLOOD GAS/COOX/LYTES/LAC/HCT (PICU/NICU ONLY)   Component Value Range   . %FIO2 30  21 - 100 (%)   . PH 7.420  7.350 - 7.450    . PCO2 24.0 (*) 33.1 - 43.1 (mm Hg)   . PO2 166 (*) 72 - 100 (mm Hg)    . BICARBONATE 18.9  18.0 - 29.0 (mmol/L)   . BASE EXCESS Test Not Performed  0.0 - 3.0 (mmol/L)   . BASE DEFICIT 7.8 (*) 0.0 - 3.0 (mmol/L)   . SPECIMEN TYPE ARTERIAL     . PIO2/FIO2 RATIO 553  >300    . HEMOGLOBIN 8.5 (*) 12.0 - 16.0 (g/dL)   . OXYHEMOGLOBIN 96.5  85.0 - 98.0 (%)   . CARBOXYHEMOGLOBIN 1.0  0.0 - 2.5 (%)   . MET-HEMOGLOBIN 1.3  0.0 - 3.0 (%)   . O2CT 11.9 (*) 15.7 - 21.6    . RTCX SPEC TYPE ARTERIAL     . WHOLE BLOOD K+ 4.1  3.5 - 5.0 (mmol/L)   . IONIZED CALCIUM 1.25 (*) 1.30 - 1.46 (mmol/L)   . SODIUM 136  136 - 145 (mmol/L)   . LACTATE 1.8 (*) <1.3 (mmol/L)   . CHLORIDE 110  96 - 111 (mmol/L)   . GLUCOSE 161 (*) 70 - 105 (mg/dL)   . HEMATOCRIT 26 (*) 33.5 - 45.2 (%)   POCT WHOLE BLOOD GLUCOSE   Component Value Range   . GLUCOSE, POINT OF CARE 167 (*) 70 - 105 (mg/dL)   POCT WHOLE BLOOD GLUCOSE   Component Value Range   . GLUCOSE, POINT OF CARE 193 (*) 70 - 105 (mg/dL)   POCT WHOLE BLOOD GLUCOSE   Component Value Range   . GLUCOSE, POINT OF CARE 148 (*) 70 - 105 (mg/dL)   BASIC METABOLIC PANEL, NON-FASTING   Component Value Range   . SODIUM 137  136 - 145 (mmol/L)   . POTASSIUM 5.0  3.5 - 5.1 (mmol/L)   . CHLORIDE 111  96 - 111 (mmol/L)   . CARBON DIOXIDE 15 (*) 22 - 32 (mmol/L)   . ANION GAP 11  5 - 16 (mmol/L)   . CREATININE  1.66 (*) 0.49 - 1.10 (mg/dL)   . ESTIMATED GLOMERULAR FILTRATION RATE 30 (*) >59 (ml/min/1.76m2)   . GLUCOSE,NONFAST 150 (*) 65 - 139 (mg/dL)   . BUN 24 (*) 6 - 20 (mg/dL)   . BUN/CREAT RATIO 14  6 - 22    . CALCIUM 8.1 (*) 8.5 - 10.4 (mg/dL)   MAGNESIUM   Component Value Range   . MAGNESIUM 2.0  1.7 - 2.5 (mg/dL)   PHOSPHORUS   Component Value Range   . PHOSPHORUS 2.8  2.4 - 4.7 (mg/dL)   POCT WHOLE BLOOD GLUCOSE   Component Value Range   . GLUCOSE, POINT OF CARE 140 (*) 70 - 105 (mg/dL)   BASIC METABOLIC PANEL, NON-FASTING   Component Value Range   . SODIUM 136  136 - 145 (mmol/L)   . POTASSIUM 4.7  3.5 - 5.1 (mmol/L)   . CHLORIDE 113 (*) 96 - 111 (mmol/L)    . CARBON DIOXIDE 15 (*) 22 - 32 (mmol/L)   . ANION GAP 8  5 - 16 (mmol/L)   . CREATININE 1.79 (*) 0.49 - 1.10 (mg/dL)   . ESTIMATED GLOMERULAR FILTRATION RATE 28 (*) >59 (ml/min/1.22m2)   . GLUCOSE,NONFAST 107  65 - 139 (mg/dL)   . BUN 24 (*) 6 - 20 (mg/dL)   . BUN/CREAT RATIO 13  6 - 22    . CALCIUM 8.2 (*) 8.5 - 10.4 (mg/dL)   MAGNESIUM   Component Value Range   . MAGNESIUM 2.1  1.7 - 2.5 (mg/dL)   PHOSPHORUS   Component Value Range   . PHOSPHORUS 1.4 (*) 2.4 - 4.7 (mg/dL)   CBC/DIFF   Component Value Range   . WBC 11.4 (*) 3.5 - 11.0 (THOU/uL)   . RBC 2.59 (*) 3.84 - 5.04 (MIL/uL)   . HGB 7.7 (*) 11.5 - 15.2 (g/dL)   . HCT 22.3 (*) 33.5 - 45.2 (%)   . MCV 86.2  82.0 - 99.0 (fL)   . MCH 29.8  27.4 - 33.0 (pg)   . MCHC 34.6  31.6 - 35.5 (g/dL)   . RDW 12.7  10.2 - 14.0 (%)   . PLATELET COUNT 125 (*) 140 - 450 (THOU/uL)   . MPV 8.1  7.4 - 10.4 (FL)   . PMN'S 78 (*) 40 - 75 (%)   . PMN ABS 8.780 (*) 1.5 - 7.7 (THOU/uL)   . LYMPHOCYTES 14 (*) 20 - 45 (%)   . LYMPHS ABS 1.610  1.0 - 4.8 (THOU/uL)   . MONOCYTES 8  4 - 13 (%)   . MONOS ABS 0.952 (*) 0.1 - 0.9 (THOU/uL)   . EOSINOPHIL 0 (*) 1 - 6 (%)   . EOS ABS 0.013 (*) 0.1 - 0.3 (THOU/uL)   . BASOPHILS 0  0 - 1 (%)   . BASOS ABS 0.005  0.0 - 0.2 (THOU/uL)   . NRBC'S 0  0 (/100WBC)   POCT WHOLE BLOOD GLUCOSE   Component Value Range   . GLUCOSE, POINT OF CARE 127 (*) 70 - 105 (mg/dL)   BLOOD GAS/COOX/LYTES/LAC/HCT (PICU/NICU ONLY)   Component Value Range   . %FIO2 30  21 - 100 (%)   . PH 7.550 (*) 7.350 - 7.450    . PCO2 17.0 (*) 33.1 - 43.1 (mm Hg)   . PO2 171 (*) 72 - 100 (mm Hg)   . BICARBONATE 20.1  18.0 - 29.0 (mmol/L)   . BASE EXCESS Test Not Performed  0.0 -  3.0 (mmol/L)   . BASE DEFICIT 6.2 (*) 0.0 - 3.0 (mmol/L)   . SPECIMEN TYPE ARTERIAL     . PIO2/FIO2 RATIO 570  >300    . HEMOGLOBIN 8.1 (*) 12.0 - 16.0 (g/dL)   . OXYHEMOGLOBIN 97.1  85.0 - 98.0 (%)   . CARBOXYHEMOGLOBIN 0.7  0.0 - 2.5 (%)   . MET-HEMOGLOBIN 0.9  0.0 - 3.0 (%)   . O2CT 11.5 (*) 15.7 - 21.6     . RTCX SPEC TYPE ARTERIAL     . WHOLE BLOOD K+ 4.0  3.5 - 5.0 (mmol/L)   . IONIZED CALCIUM 1.28 (*) 1.30 - 1.46 (mmol/L)   . SODIUM 136  136 - 145 (mmol/L)   . LACTATE 2.8 (*) <1.3 (mmol/L)   . CHLORIDE 111  96 - 111 (mmol/L)   . GLUCOSE 113 (*) 70 - 105 (mg/dL)   . HEMATOCRIT 24 (*) 33.5 - 45.2 (%)   POCT WHOLE BLOOD GLUCOSE   Component Value Range   . GLUCOSE, POINT OF CARE 93  70 - 105 (mg/dL)   ARTERIAL BLOOD GAS/K+/ICA++/COOX   Component Value Range   . WHOLE BLOOD K+ 4.0  3.5 - 5.0 (mmol/L)   . IONIZED CALCIUM 1.25 (*) 1.30 - 1.46 (mmol/L)   . %FIO2 30  21 - 100 (%)   . PH 7.390  7.350 - 7.450    . PCO2 28.0 (*) 33.1 - 43.1 (mm Hg)   . PO2 125 (*) 72 - 100 (mm Hg)   . BICARBONATE 19.3  18.0 - 29.0 (mmol/L)   . BASE EXCESS Test Not Performed  0.0 - 3.0 (mmol/L)   . BASE DEFICIT 7.3 (*) 0.0 - 3.0 (mmol/L)   . SPECIMEN TYPE ARTERIAL     . PIO2/FIO2 RATIO 417  >300    . HEMOGLOBIN 6.5 (*) 12.0 - 16.0 (g/dL)   . OXYHEMOGLOBIN 97.0  85.0 - 98.0 (%)   . CARBOXYHEMOGLOBIN 1.4  0.0 - 2.5 (%)   . MET-HEMOGLOBIN 0.9  0.0 - 3.0 (%)   . O2CT 9.2 (*) 15.7 - 21.6    . RTCX SPEC TYPE ARTERIAL     . HEMATOCRIT 20 (*) 33.5 - 45.2 (%)   H & H   Component Value Range   . HGB 6.1 (*) 11.5 - 15.2 (g/dL)   . HCT 17.7 (*) 33.5 - 45.2 (%)   POCT WHOLE BLOOD GLUCOSE   Component Value Range   . GLUCOSE, POINT OF CARE 132 (*) 70 - 105 (mg/dL)   POCT WHOLE BLOOD GLUCOSE   Component Value Range   . GLUCOSE, POINT OF CARE 196 (*) 70 - 105 (mg/dL)   POCT WHOLE BLOOD GLUCOSE   Component Value Range   . GLUCOSE, POINT OF CARE 213 (*) 70 - 105 (mg/dL)   BLOOD GAS/COOX/LYTES/LAC/HCT (PICU/NICU ONLY)   Component Value Range   . %FIO2 30  21 - 100 (%)   . PH 7.540 (*) 7.350 - 7.450    . PCO2 17.0 (*) 33.1 - 43.1 (mm Hg)   . PO2 149 (*) 72 - 100 (mm Hg)   . BICARBONATE 19.7  18.0 - 29.0 (mmol/L)   . BASE EXCESS Test Not Performed  0.0 - 3.0 (mmol/L)   . BASE DEFICIT 6.7 (*) 0.0 - 3.0 (mmol/L)   . SPECIMEN TYPE ARTERIAL      . PIO2/FIO2 RATIO 497  >300    . HEMOGLOBIN 8.4 (*) 12.0 - 16.0 (g/dL)   .  OXYHEMOGLOBIN 96.2  85.0 - 98.0 (%)   . CARBOXYHEMOGLOBIN 1.1  0.0 - 2.5 (%)   . MET-HEMOGLOBIN 1.5  0.0 - 3.0 (%)   . O2CT 11.7 (*) 15.7 - 21.6    . RTCX SPEC TYPE ARTERIAL     . WHOLE BLOOD K+ 4.4  3.5 - 5.0 (mmol/L)   . IONIZED CALCIUM 1.23 (*) 1.30 - 1.46 (mmol/L)   . SODIUM 135 (*) 136 - 145 (mmol/L)   . LACTATE 1.9 (*) <1.3 (mmol/L)   . CHLORIDE 107  96 - 111 (mmol/L)   . GLUCOSE 192 (*) 70 - 105 (mg/dL)   . HEMATOCRIT 25 (*) 33.5 - 45.2 (%)   POCT WHOLE BLOOD GLUCOSE   Component Value Range   . GLUCOSE, POINT OF CARE 153 (*) 70 - 105 (mg/dL)   BLOOD GAS/COOX/LYTES/LAC/HCT (PICU/NICU ONLY)   Component Value Range   . %FIO2 30  21 - 100 (%)   . PH 7.480 (*) 7.350 - 7.450    . PCO2 19.0 (*) 33.1 - 43.1 (mm Hg)   . PO2 154 (*) 72 - 100 (mm Hg)   . BICARBONATE 19.1  18.0 - 29.0 (mmol/L)   . BASE EXCESS Test Not Performed  0.0 - 3.0 (mmol/L)   . BASE DEFICIT 7.5 (*) 0.0 - 3.0 (mmol/L)   . SPECIMEN TYPE ARTERIAL     . PIO2/FIO2 RATIO 513  >300    . HEMOGLOBIN 11.0 (*) 12.0 - 16.0 (g/dL)   . OXYHEMOGLOBIN 96.1  85.0 - 98.0 (%)   . CARBOXYHEMOGLOBIN 0.6  0.0 - 2.5 (%)   . MET-HEMOGLOBIN 1.2  0.0 - 3.0 (%)   . O2CT 15.2 (*) 15.7 - 21.6    . RTCX SPEC TYPE ARTERIAL     . WHOLE BLOOD K+ 3.8  3.5 - 5.0 (mmol/L)   . IONIZED CALCIUM 1.20 (*) 1.30 - 1.46 (mmol/L)   . SODIUM 137  136 - 145 (mmol/L)   . LACTATE 2.7 (*) <1.3 (mmol/L)   . CHLORIDE 110  96 - 111 (mmol/L)   . GLUCOSE 158 (*) 70 - 105 (mg/dL)   . HEMATOCRIT 33 (*) 33.5 - 45.2 (%)   CREATININE BODY FLUID   Component Value Range   . CREATININE, FLUID 3.5  (mg/dL)   POCT WHOLE BLOOD GLUCOSE   Component Value Range   . GLUCOSE, POINT OF CARE 110 (*) 70 - 105 (mg/dL)   PHOSPHORUS   Component Value Range   . PHOSPHORUS 3.7  2.4 - 4.7 (mg/dL)   H & H   Component Value Range   . HGB 10.3 (*) 11.5 - 15.2 (g/dL)   . HCT 30.0 (*) 33.5 - 45.2 (%)   POTASSIUM   Component Value Range    . POTASSIUM 4.0  3.5 - 5.1 (mmol/L)         Imaging Studies:    Chest X-ray:  No acute pulmonary abnormality. No cardiac enlargement or pulmonary edema. Slight respiratory motion artifact. No cardiac enlargement.    Impression/Recommendations:    This is a 70 y/o female with PMH significant for TCC of the left renal pelvis, s/p left nephroureterectomy four years ago, now admitted to hospital with bladder mass s/p Radical cystectomy with creation of an ileoconduit diversion on 12/31/10, now found to have deteriorating kidney ficunctions.  - She has only right kidney for last four years after nephrectomy of right kidney secondary to TCC. Looks likely her creatinine on 12/210 was 1.27 and has  increased to 1.79 post surgically. She has a long surgery lasted for seven hours. Post op her blood pressure was 85/51 mm of Hg. Seems likely she has hypotensive injury to kidney. It could be secondary to post renal as she has mild hydronephrosis on renal USG or could be renal etiology.     - Get urinalysis, urine electrolytes with urinary protein to creatinine ration.   - Check electrolytes, BUN and creatinine daily.   - Adjust ceftriaxone to renal dose, 1 Gm Q24 hours.  - Change IVF to D5W with 150 meq of sodium bicarbonate per litter at 50 ml per hour.   - Monitor blood pressure, keep it within normal limits.   - Will follow.      Jeanice Lim, MD 01/01/2011, 1:58 PM        - AKI after long surgery, suggesting ischemic ATN. New hydronephrosis after surgery could be normal adaptation response after surgery. Work up as suggested above.   I saw and examined the patient.  I reviewed the resident's note.  I agree with the findings and plan of care as documented in the resident's note.  Any exceptions/additions are edited/noted.    Early Osmond, MD 01/01/2011, 9:34 PM

## 2011-01-01 NOTE — Care Management Notes (Signed)
Care Coordinator/Social Work Plan  [place]  Patient Name: Christy Hale MRN: [id number] Acct Number: [id number]  DOB: 08-Aug-1941 Age: 70  **Admission Information**  Patient Type: INPATIENT  Admit Date: 12/31/2010 Admit Time: 04:52  Admit Reason: BLADDER CANCER  Admitting Phys: Christy Hale  Attending Phys: Christy Hale  Unit: SICU Bed: SICU-09  100. Adult Admission Assessment  Created by : Christy Hale Date/Time 2011-01-01 10:27:25.366  Patient Assessment & Education  Level of Care Met with pt/family/other and provided education on INPT patient class   CCC/MSW met with pt/family/other and provided education on On role of the Clinical Care Coordinator On role of the  Medical Social Worker On role with discharge/transitional planning,   CCC/MSW reviewed with patient/family/other Transportation plan Christy Hale(patients friend)  Appointed Health Care Decision Maker:  Appointed Healthcare Decision Maker Type- (MPOA, HCS, Guardian, Conservator...) MPOA is husband as Geneticist, molecular  as secondary   Communications:  Primary Language: English   Does the patient/caregiver need a Translator: Not Applicable   Living Arrangments  Prior to admission housing arrangements: With Spouse House/Private Residence   Steps: Number of steps to enter   Agencies, Pharmacy, Dialysis and PCP Active Prior to Admission:  Agency: Name & Phone Number Uses Christy Hale. [employer]  Does the patient have prescription coverage? Yes- Commercial   Additional Information  Note: HD # 1. POD # 1, s/p cystectomy, urthectomy, ileoconduit formation, and rt ureteral stent placement. Resp failure  after sx initially intubated. Transferred to intensive care service. Patient extubated today-answering questions per  staff. H/H 6.1, 3 U of rbc transfusion. F/u H/H at noon. Insulin gtt continues. FS Q hour. Continues with aggressive  fluid resuscitation. LR at 172ml/hr. Plan for renal u/s. Concern for decision maker-patient's husband primary MPOA   inpatient at [facility] in [facility] for open heart sx. Unable to make decisions if needed. MSW Christy Hale stated MPOA  forms on chart with husband as primary MPOA and son Christy Hale as secndary MPOA. Contacted family friend Christy Hale to obtain  Christy Hale's phone number. Christy [phone]. Patient has had homemakers/[employer] in home to help with  ADLs/cleaning/cooking in past. Discharge plan: Awaiting PT/OT eval. Patient will not have support in home d/t husband  having open heart sx in [place]. [place] and unable to provide assistance if needed. CCC will continue  to reassess dishcarge plan/needs.

## 2011-01-01 NOTE — Nurses Notes (Signed)
Patient transfusion of 3 units PRBC's complete this am. Repeat labs due at 1200. Weaning parameters obtained by rrt and abg sent. Cont. Q4 hour abg. Urology in to see patient and aware that there has been no drainage from the ileal conduit since OR. Specimen sent from pelvic drain as ordered. Cont. To monitor.

## 2011-01-01 NOTE — Nurses Notes (Signed)
01/01/11 1800   Vital Signs   Pulse 79    Resp ! 25    ART BP 200/78 mmHg   ART-Line  MAP 125 mmHg   CVP 10 MM HG   Oxygen Therapy   SpO2 98 %      gave prn dose labetolol for hypertension. PCA initiated, as ordered. Urology in to see patient. Patient continues to c/o nausea. Received orders for kytril. Will give when received from pharmacy.

## 2011-01-01 NOTE — Nurses Notes (Deleted)
01/01/11 0900   Vital Signs   Pulse 78    Resp 14    ART BP 118/46 mmHg   ART-Line  MAP 71 mmHg   CVP 8 MM HG   Oxygen Therapy   SpO2 96 %      Increased patient neo to 1 mcg for MAP less than 100. Dr. Jean Rosenthal at bedside requested MAP be maintained >120. Will titrate to effect.

## 2011-01-01 NOTE — Care Management Notes (Signed)
Care Coordinator/Social Work Plan  [place]  Patient Name: Christy Hale MRN: [id number] Acct Number: [id number]  DOB: 10-27-1940 Age: 71  **Admission Information**  Patient Type: INPATIENT  Admit Date: 12/31/2010 Admit Time: 04:52  Admit Reason: BLADDER CANCER  Admitting Phys: Caryl Ada  Attending Phys: Caryl Ada  Unit: SICU Bed: SICU-09  130. CM Assessment Notes  Created by : Judeth Cornfield Date/Time 2011-01-01 12:49:[id number]  Assessment Notes  Note:  Note: Addendum: Patient lives in [place]/ [employer]  Consult and Reassessment Will continue to re-assess for changes in plan of care or discharge needs

## 2011-01-01 NOTE — Progress Notes (Addendum)
[facility]                                               SICU PROGRESS NOTE    Christy Hale, Christy Hale  Date of Admission:  12/31/2010  Date of Service: 01/01/2011  Date of Birth:  24-Jan-1941    Primary Attending: Dr. Marzetta Board  Primary Service: Urology    Post Op Day:1 Day Post-Op S/P Procedure(s) (LRB):  CYSTECTOMY RADICAL (N/A)  PLACEMENT CONDUIT URINARY (N/A)  Subjective: Anuria.     Vital Signs:  Temp (24hrs) Max:38 C (100.4 F)      Systolic (24hrs), Avg:122 mmHg, Min:71 mmHg, Max:183 mmHg    Diastolic (24hrs), Avg:59 mmHg, Min:45 mmHg, Max:81 mmHg    Temp  Avg: 37.3 C (99.2 F)  Min: 36.7 C (98.1 F)  Max: 38 C (100.4 F)  Pulse  Avg: 79.5   Min: 65   Max: 89   Resp  Avg: 20   Min: 9   Max: 67   SpO2  Avg: 99.8 %  Min: 98 %  Max: 100 %  MAP (Non-Invasive)  Avg: 83.3 mmHG  Min: 52 mmHG  Max: 117 mmHG  Pain Score: 0    Hemodynamics:  CVP: 4 MM HG  ART-Line  MAP: 100 mmHg    Min/Max/Avg ICP/CPP last 24hrs:   No Data Recorded    Ventilator Settings:Conventional:  Mode: CPAP/PS  Set VT: 600 mL  Set Rate: 12 Breaths Per Minute  Set PEEP: 5 cmH2O  Pressure Support: 10 cmH2O  FiO2: 30 %    Blood Gas Results:  Recent Labs   Basename 01/01/11 0629 01/01/11 0237 01/01/11 0049   . SPECIMENTYPE ARTERIAL ARTERIAL ARTERIAL   . FI02 30 30 30    . PH 7.540* 7.390 7.550*   . PCO2 17.0* 28.0* 17.0*   . PO2 149* 125* 171*   . BICARBONATE 19.7 19.3 20.1   . BASEEXCESS Test Not Performed Test Not Performed Test Not Performed   . BASEDEFICIT 6.7* 7.3* 6.2*   . PFRATIO 497 417 570         Weaning Parameters:    Min. Volume: 15.9 Liters  Resp Rate: 18 Breath Per Minute  Avg. VT: 883.33 mL  FVC: 1.2 Liters  NIF: -40 cmH2O  RSBI: 20   Cuff Leaked Assessment: Leak Present    Current Medications:    Current facility-administered medications:  sodium phosphate 15 mmol in D5W 100 mL IVPB 15 mmol Intravenous Once   citalopram (CELEXA) tablet  40 mg Oral Daily    cefTRIAXone (ROCEPHIN) 2 g in NS 50 mL IVPB 2 g Intravenous Q24H   SSIP insulin R human (HUMULIN R) 100 units/mL injection  4-12 Units Subcutaneous 4x/day PRN   acetaminophen (TYLENOL) tablet  650 mg Oral Q6H PRN   ondansetron (ZOFRAN) 2 mg/mL injection  4 mg Intravenous Q6H PRN   NS 10 mL injection  2 mL Intracatheter Q8HRS   NS 10 mL injection  2-6 mL Intracatheter Q1 MIN PRN   methylnaltrexone (RELISTOR) injection  0.3 mg/kg Subcutaneous Q6H   phenylephrine (NEO-SYNEPHRINE) injection ---Cabinet Override      insulin regular human (HUMULIN R) 250 Units in NS 250 mL infusion 2 Units/hr Intravenous Continuous   dextrose 50% (0.5 g/mL) injection  12.5 g Intravenous Q1H PRN   simvastatin (ZOCOR) tablet  40 mg Oral  QPM   esomeprazole (NEXIUM) capsule  40 mg Oral QAM   multivitamin tablet  1 Tab Oral Daily   dexmedetomidine (PRECEDEX) 200 mcg in NS 50mL (tot vol) infusion  0.2 mcg/kg/hr Intravenous Continuous   HYDROmorphone (DILAUDID) 1 mg/mL injection  0.4 mg Intravenous Q2H PRN   LR premix infusion   Intravenous Continuous   labetalol (TRANDATE) 5 mg/mL injection  5 mg Intravenous Q2H PRN   hydrALAZINE (APRESOLINE) injection 10 mg 10 mg Intravenous Q4H PRN   sennosides (SENOKOT) 8.8mg  per 5mL oral liquid  5 mL Oral 2x/day   docusate sodium (COLACE) 10mg  per mL oral liquid  50 mg Oral 2x/day         Today's Physical Exam:  Vitals: BP 144/65   Pulse 85   Temp 38 C (100.4 F)   Resp 20   Ht 1.575 m (5' 2.01")   Wt 97.8 kg (215 lb 9.8 oz)   BMI 39.43 kg/m2   SpO2 100%    Labs:  Lab Results for Last 24 Hours:        I/O:  I/O last 24 hours:    Intake/Output Summary (Last 24 hours) at 01/01/11 0744  Last data filed at 01/01/11 0700   Gross per 24 hour   Intake 5126.76 ml   Output    855 ml   Net 4271.76 ml       I/O current shift:  03/14 0000 - 03/14 0759  In: 3175.36 [I.V.:1382.36; Blood:1793]  Out: 450 [Drains:450]    Drips:   IV Drips   Medication Dose Frequency Last Rate    . insulin regular human (HUMULIN R) 250 Units in NS 250 mL infusion  2 Units/hr Continuous 3 Units/hr (01/01/11 4332)   . dexmedetomidine (PRECEDEX) 200 mcg in NS 50mL (tot vol) infusion   0.2 mcg/kg/hr Continuous 0.2 mcg/kg/hr (01/01/11 0112)   . LR premix infusion    Continuous 150 mL/hr (01/01/11 0112)   . DISCONTD: LR premix infusion    Continuous 30 mL/hr (12/31/10 0643)   . DISCONTD: D5W 1/2 NS 1000 mL with potassium chloride 20 mEq premix infusion    Continuous     . DISCONTD: LR premix infusion    Continuous 50 mL/hr (12/31/10 1600)   . DISCONTD: LR premix infusion    Continuous 100 mL/hr (12/31/10 1800)   . DISCONTD: phenylephrine (NEO-SYNEPHRINE) 60 mg in NS 250 mL infusion  0.2 mcg/kg/min Continuous Stopped (12/31/10 1800)         Drains: Ostomy bag    Lines (Type and Location)  Date Placed  Date Changed  Necessity Review   1. R IJ 3/13     2. NG 3/13     3.      4.      5.        Wounds  (Type and Location)   1. Surgical  Anterior abdomen                Respiratory Secretions: None    Prophylaxis:  Date Started Date Completed   DVT/PE: SCDs/ Venodynes     GI: Proton Pump inhibitor       Sedation/ Analgesia/ Paralysis: (SAS score and Drug Free Holiday)         Antibiotics: Date Started Date Completed   1.     2.     3.     4.       Nutrition/Residuals:  NPO    Weight Bearing Status:  partial weight  bearing    Radiology Results: CXR:  pending     Other Testing: None    Microbiology: None     Assessment/ Plan:   Active Hospital Problems   Diagnoses   . Primary Problem: Bladder cancer   . Metabolic acidosis   . Hypomagnesemia   . Hx of total cystectomy   . Hx of unilateral nephrectomy   . Respiratory failure following trauma and surgery   . AKI (acute kidney injury)   . Type 2 diabetes mellitus   . Hyperlipidemia       Christy Hale is a 70 y.o. female   POD#1   Open radical cystectomy with closure of vaginal cuff and urethrectomy   Creation of Ileal conduit diversion   Bilateral pelvic lymphadenectomy    Appendectomy   Exploratory laparotomy   Retrograde insertion of 6 X 20 cm double J ureteral stent into right ureter   Neuro   GCS 4/6/1 T  precedex gtt   Off sedation after extubation and doing well, PCA controlling pain better  Resp   Mode: CPAP/ PS  Wean to extubate, good parameter  BD 6.7, improving  Resp failure resolved, stable after extubation.    CV   Monitor MAP and CVP.   HLD - Zocor   EKG unchanged from 12/20/10   Off neo gtt  Renal   Ieloconduit. Monitor output.   No UOP at al  F/u urology recommendation on testing  LR @ 150 ml/hr   Improved urine output after urology staff intervention.  Appreciate nephrology input  Will continue bicarb infusion and follow closely for metabolic acidosis, likely in part due to renal dysfunction and bicarb depletion from conduit.   GI   NPO   Nexium, Senokot   Heme   H/H 6.1, 3 U of rbc transfusion  F/u H/H at noon  INR 1.1  SCDs.   Anemia likely secondary to dilution after fluids, will follow.  Currently no evidence of bleeding.   Heme   Afebrile   Ceftriaxone pre and post-op   Endo   Insulin drip   Accuchecks q1 hour   FSB 213    Yuan-Feng Lo, MD 01/01/2011, 7:44 AM        SICU Attending Note 01/01/2011    I performed a history and physical exam of the patient and discussed management with the resident. Please refer to SICU note dated 01/01/2011 I reviewed the resident's note and agree with the documented findings and plan of care except as noted with the following additions noted in bold and color.       I was present at the bedside of this critically ill patient for 45 minutes exclusive of procedures.  This patient suffers from failure or dysfunction of 5 system(s).  The care of this patient was in regard to managing (a) conditions(s) that has a high probability of sudden, clinically significant, or life-threatening deterioration and required a high degree of Attending Physician attention and direct involvement to intervene urgently. Data review and care planning was performed in direct proximity of the patient, examination was obviously performed in direct contact with the patient. All of this time was exclusive of procedure which will be documented elsewhere in the chart.    My critical care time involved full attention to the patients' condition and included:    Review of nursing notes and/or old charts  Review of medications, allergies, and vital signs  Documentation time  Consultant collaboration on findings and treatment options  Care, transfer of care, and discharge plans  Ordering, interpreting, and reviewing diagnostic studies/tab tests  Obtaining necessary history from family, EMS, nursing home staff and/or treating physicians    My critical care time did not include time spent teaching resident physician(s) or other services of resident physicians, or performing other reported procedures.  Total Critical Care Time: 45 minutes    Electronically Signed:   Lowella Petties, MD 01/01/2011, 10:51 PM      I saw and examined the patient.  I reviewed the resident's note.  I agree with the findings and plan of care as documented in the resident's note.  Any exceptions/additions are edited/noted.    Lowella Petties, MD 01/01/2011, 10:52 PM

## 2011-01-01 NOTE — Nurses Notes (Addendum)
01/01/11 1711   Vital Signs   Pulse 84    Resp ! 35    ART BP 195/75 mmHg   ART-Line  MAP 121 mmHg   CVP 14 MM HG   Oxygen Therapy   SpO2 98 %      Patient c/o abdominal pain. Gave prn dilaudid. REmained hypertensive.Gave prn labetolol. Patient bed saturated with fluid that appears to be coming from both her midline abdominal incision and from around her vaginal packing. Dark dusky area noted at lower portion of incision line extending toward LLQ. Another 500 cc noted from pelvic drain and urology notified as well as SICU. Dr. Freida Busman at bedside and examined patient. Plan to start PCA for better pain control. Cont. With prn doses for hypertension. Urology reports they believe fluid leaking to be third spacing and creatinine has been sent on pelvic drain fluid and was neg.

## 2011-01-01 NOTE — Nurses Notes (Signed)
Patient extubated to nasal cannula by rrt as ordered. Patient bbs clear, no stridor on auscultation. Able to state her name, asks appropriate questions. Social services contacted regarding possible need for decision maker clarification. Patient husband currently inpatient at [facility] and unable to be present or help with decision making if need be.

## 2011-01-01 NOTE — Progress Notes (Addendum)
[facility]                                                  UROLOGY  PROGRESS NOTE    Christy Hale, Christy Hale, 70 y.o. female  Date of Admission:  12/31/2010  Date of Birth:  1940-12-21  Date of Service:  01/01/2011    Post Op Day: 1 Day Post-Op S/P Procedure(s) (LRB):  CYSTECTOMY RADICAL (N/A)  PLACEMENT CONDUIT URINARY (N/A)    Subjective: No acute urologic events, remains in SICU, currently intubated    Vital Signs:  Temp (24hrs) Max:38.7 C (101.7 F)      Temperature: 38.7 C (101.7 F) (01/01/11 0800)  BP (Non-Invasive): 144/65 mmHg (01/01/11 0630)  MAP (Non-Invasive): 97 mmHG (01/01/11 0630)  Heart Rate: 85  (01/01/11 0700)  Respiratory Rate: 20  (01/01/11 0700)  Pain Score: 0 (01/01/11 0400)  SpO2-1: 100 % (01/01/11 0757)    Current Medications:    Current facility-administered medications:  sodium phosphate 15 mmol in D5W 100 mL IVPB 15 mmol Intravenous Once   citalopram (CELEXA) tablet  40 mg Oral Daily   cefTRIAXone (ROCEPHIN) 2 g in NS 50 mL IVPB 2 g Intravenous Q24H   SSIP insulin R human (HUMULIN R) 100 units/mL injection  4-12 Units Subcutaneous 4x/day PRN   acetaminophen (TYLENOL) tablet  650 mg Oral Q6H PRN   ondansetron (ZOFRAN) 2 mg/mL injection  4 mg Intravenous Q6H PRN   NS 10 mL injection  2 mL Intracatheter Q8HRS   NS 10 mL injection  2-6 mL Intracatheter Q1 MIN PRN   methylnaltrexone (RELISTOR) injection  0.3 mg/kg Subcutaneous Q6H   phenylephrine (NEO-SYNEPHRINE) injection ---Cabinet Override      insulin regular human (HUMULIN R) 250 Units in NS 250 mL infusion 2 Units/hr Intravenous Continuous   dextrose 50% (0.5 g/mL) injection  12.5 g Intravenous Q1H PRN   simvastatin (ZOCOR) tablet  40 mg Oral QPM   esomeprazole (NEXIUM) capsule  40 mg Oral QAM   multivitamin tablet  1 Tab Oral Daily   dexmedetomidine (PRECEDEX) 200 mcg in NS 50mL (tot vol) infusion  0.2 mcg/kg/hr Intravenous Continuous    HYDROmorphone (DILAUDID) 1 mg/mL injection  0.4 mg Intravenous Q2H PRN   LR premix infusion   Intravenous Continuous   labetalol (TRANDATE) 5 mg/mL injection  5 mg Intravenous Q2H PRN   hydrALAZINE (APRESOLINE) injection 10 mg 10 mg Intravenous Q4H PRN   sennosides (SENOKOT) 8.8mg  per 5mL oral liquid  5 mL Oral 2x/day   docusate sodium (COLACE) 10mg  per mL oral liquid  50 mg Oral 2x/day         Today's Physical Exam:  Constitutional - intubated  Eyes - Clear  Neuro - sedated  CV - RR  Pulm - CTA B/L  GI - Soft, stoma is pink/viable, incision c/d/i  Skin - Warm and Dry  GU - pelvic drain in place with serosanguinous drainage      I/O:   Intake/Output Summary (Last 24 hours) at 01/01/11 0820  Last data filed at 01/01/11 0700   Gross per 24 hour   Intake 5126.76 ml   Output    855 ml   Net 4271.76 ml       I/O current shift:       Prophylaxis:  Date Started Date Completed   DVT/PE SCDs/ [facility]  GI         Nutrition/Residuals:  NPO       Labs  Please indicate ordered or reviewed)  Reviewed: BMP:     Recent Labs   Basename 01/01/11 0629 01/01/11 0028   . SODIUM 135* --   . POTASSIUM -- 4.7   . CHLORIDE 107 --   . CO2 -- 15*   . BUN -- 24*   . CREATININE -- 1.79*   . GLUCOSENF -- 107   . GLUCOSEFAST -- --   . ANIONGAP -- 8   . BUNCRRATIO -- 13   . GFR -- 28*   . CALCIUM -- 8.2*       CBC Results Differential Results   Recent Labs   [facility] 01/01/11 0251 01/01/11 0028   . WBC -- 11.4*   . HGB 6.1* --   . HCT 17.7* --   . PLTCNT -- 125*      Recent Results (from the past 30 hour(s))   CBC/DIFF    Collection Time    01/01/11 12:28 AM   Component Value Comment   . WBC 11.4    . PMN'S 78    . LYMPHOCYTES 14    . MONOCYTES 8    . EOSINOPHIL 0    . BASOPHILS 0    . NRBC'S 0             Assessment/ Plan:   Active Hospital Problems   (*Primary Problem)   Diagnoses   . *Bladder cancer   . Metabolic acidosis   . Hypomagnesemia   . Hx of total cystectomy   . Hx of unilateral nephrectomy    . Respiratory failure following trauma and surgery   . AKI (acute kidney injury)   . Type 2 diabetes mellitus     dx 20 + yrs ago, does not check fs at home     . Hyperlipidemia       POD 1 Open radical cystectomy with closure of vaginal cuff and urethrectomy    Remains in SICU  Stoma nursing to see patient  Will send Foley drain for Cr  Recc Aggressive fluid resuscitation  Recc continue with plan for renal u/s  Appreciate SICU assistance    [place] P Hubsher, MD 01/01/2011, 8:20 AM    I saw and examined the patient.  I reviewed the resident's note.  I agree with the findings and plan of care as documented in the resident's note.  Any exceptions/additions are edited/noted.    Caryl Ada, MD 01/01/2011, 1:12 PM

## 2011-01-01 NOTE — Nurses Notes (Signed)
Dr. Owens Loffler notified of AM lab results. Replacements ordered. Will continue to monitor.

## 2011-01-02 ENCOUNTER — Inpatient Hospital Stay (HOSPITAL_COMMUNITY): Payer: Medicare Other

## 2011-01-02 LAB — URINALYSIS MACROSCOPIC WITH REFLEX TO MICROSCOPIC URINALYSIS (CULTURE NOT PERFORMED)
BILIRUBIN: NEGATIVE
GLUCOSE: 50 mg/dL — AB
KETONES: NEGATIVE mg/dL
NITRITE: NEGATIVE
PH URINE: 9 — ABNORMAL HIGH (ref 5.0–8.0)
PROTEIN: 100 mg/dL — AB
SPECIFIC GRAVITY, URINE: 1.01 (ref 1.005–1.030)
UROBILINOGEN: NORMAL mg/dL

## 2011-01-02 LAB — BASIC METABOLIC PANEL
ANION GAP: 11 mmol/L (ref 5–16)
ANION GAP: 9 mmol/L (ref 5–16)
BUN/CREAT RATIO: 11 (ref 6–22)
BUN/CREAT RATIO: 10 (ref 6–22)
BUN: 31 mg/dL — ABNORMAL HIGH (ref 6–20)
BUN: 32 mg/dL — ABNORMAL HIGH (ref 6–20)
CALCIUM: 8.4 mg/dL — ABNORMAL LOW (ref 8.5–10.4)
CALCIUM: 8.7 mg/dL (ref 8.5–10.4)
CARBON DIOXIDE: 18 mmol/L — ABNORMAL LOW (ref 22–32)
CARBON DIOXIDE: 20 mmol/L — ABNORMAL LOW (ref 22–32)
CHLORIDE: 106 mmol/L (ref 96–111)
CHLORIDE: 108 mmol/L (ref 96–111)
CREATININE: 2.78 mg/dL — ABNORMAL HIGH (ref 0.49–1.10)
CREATININE: 3.1 mg/dL — ABNORMAL HIGH (ref 0.49–1.10)
ESTIMATED GLOMERULAR FILTRATION RATE: 17 ml/min/1.73m2 — ABNORMAL LOW (ref >59)
ESTIMATED GLOMERULAR FILTRATION RATE: 15 ml/min/1.73m2 — ABNORMAL LOW (ref >59)
GLUCOSE,NONFAST: 279 mg/dL — ABNORMAL HIGH (ref 65–139)
GLUCOSE,NONFAST: 238 mg/dL — ABNORMAL HIGH (ref 65–139)
POTASSIUM: 3.9 mmol/L (ref 3.5–5.1)
POTASSIUM: 4.2 mmol/L (ref 3.5–5.1)
SODIUM: 135 mmol/L — ABNORMAL LOW (ref 136–145)
SODIUM: 137 mmol/L (ref 136–145)

## 2011-01-02 LAB — PERFORM POC WHOLE BLOOD GLUCOSE
GLUCOSE, POINT OF CARE: 254 mg/dL — ABNORMAL HIGH (ref 70–105)
GLUCOSE, POINT OF CARE: 175 mg/dL — ABNORMAL HIGH (ref 70–105)
GLUCOSE, POINT OF CARE: 151 mg/dL — ABNORMAL HIGH (ref 70–105)
GLUCOSE, POINT OF CARE: 102 mg/dL (ref 70–105)
GLUCOSE, POINT OF CARE: 93 mg/dL (ref 70–105)
GLUCOSE, POINT OF CARE: 95 mg/dL (ref 70–105)
GLUCOSE, POINT OF CARE: 109 mg/dL — ABNORMAL HIGH (ref 70–105)
GLUCOSE, POINT OF CARE: 138 mg/dL — ABNORMAL HIGH (ref 70–105)
GLUCOSE, POINT OF CARE: 136 mg/dL — ABNORMAL HIGH (ref 70–105)
GLUCOSE, POINT OF CARE: 104 mg/dL (ref 70–105)
GLUCOSE, POINT OF CARE: 76 mg/dL (ref 70–105)
GLUCOSE, POINT OF CARE: 81 mg/dL (ref 70–105)
GLUCOSE, POINT OF CARE: 108 mg/dL — ABNORMAL HIGH (ref 70–105)
GLUCOSE, POINT OF CARE: 124 mg/dL — ABNORMAL HIGH (ref 70–105)

## 2011-01-02 LAB — CBC/DIFF
BASOPHILS: 0 % (ref 0–1)
BASOS ABS: 0 THOU/uL (ref 0.0–0.2)
EOS ABS: 0 THOU/uL — ABNORMAL LOW (ref 0.1–0.3)
EOSINOPHIL: 0 % — ABNORMAL LOW (ref 1–6)
HCT: 34.4 % (ref 33.5–45.2)
HGB: 11.8 g/dL (ref 11.5–15.2)
LYMPHOCYTES: 9 % — ABNORMAL LOW (ref 20–45)
LYMPHS ABS: 1.647 10*3/uL (ref 1.0–4.8)
MCH: 29.6 pg (ref 27.4–33.0)
MCHC: 34.3 g/dL (ref 31.6–35.5)
MCV: 86.4 fL (ref 82.0–99.0)
MONOCYTES: 4 % (ref 4–13)
MONOS ABS: 0.732 THOU/uL (ref 0.1–0.9)
MPV: 7.4 FL (ref 7.4–10.4)
PLATELET COUNT: 122 THOU/uL — ABNORMAL LOW (ref 140–450)
PMN ABS: 15.921 THOU/uL — ABNORMAL HIGH (ref 1.5–7.7)
PMN'S: 87 % — ABNORMAL HIGH (ref 40–75)
RBC: 3.98 MIL/uL (ref 3.84–5.04)
RDW: 13.6 % (ref 10.2–14.0)
WBC: 18.3 THOU/uL — ABNORMAL HIGH (ref 3.5–11.0)

## 2011-01-02 LAB — ARTERIAL BLOOD GAS/LACTATE/CO-OX/LYTES (NA/K/CA/CL/GLUC) - ORS ONLY
%FIO2: 28 % (ref 21–100)
%FIO2: 28 % (ref 21–100)
BASE DEFICIT: 4.9 mmol/L — ABNORMAL HIGH (ref 0.0–3.0)
BASE DEFICIT: 4.2 mmol/L — ABNORMAL HIGH (ref 0.0–3.0)
BASE DEFICIT: 2.3 mmol/L (ref 0.0–3.0)
BICARBONATE: 21.1 mmol/L (ref 18.0–29.0)
BICARBONATE: 21.6 mmol/L (ref 18.0–29.0)
BICARBONATE: 23.1 mmol/L (ref 18.0–29.0)
CARBOXYHEMOGLOBIN: 1.3 % (ref 0.0–2.5)
CARBOXYHEMOGLOBIN: 0.4 % (ref 0.0–2.5)
CARBOXYHEMOGLOBIN: 0.3 % (ref 0.0–2.5)
CHLORIDE: 104 mmol/L (ref 96–111)
CHLORIDE: 104 mmol/L (ref 96–111)
CHLORIDE: 107 mmol/L (ref 96–111)
GLUCOSE: 282 mg/dL — ABNORMAL HIGH (ref 70–105)
GLUCOSE: 242 mg/dL — ABNORMAL HIGH (ref 70–105)
GLUCOSE: 92 mg/dL (ref 70–105)
HEMATOCRIT: 36 % (ref 33.5–45.2)
HEMATOCRIT: 34 % (ref 33.5–45.2)
HEMOGLOBIN: 14.8 g/dL (ref 12.0–16.0)
HEMOGLOBIN: 11.9 g/dL — ABNORMAL LOW (ref 12.0–16.0)
HEMOGLOBIN: 11.2 g/dL — ABNORMAL LOW (ref 12.0–16.0)
IONIZED CALCIUM: 1.22 mmol/L — ABNORMAL LOW (ref 1.30–1.46)
IONIZED CALCIUM: 1.21 mmol/L — ABNORMAL LOW (ref 1.30–1.46)
IONIZED CALCIUM: 1.18 mmol/L — ABNORMAL LOW (ref 1.30–1.46)
LACTATE: 1.2 mmol/L (ref <1.3)
LACTATE: 1.1 mmol/L (ref <1.3)
LACTATE: 0.7 mmol/L (ref <1.3)
MET-HEMOGLOBIN: 0.8 % (ref 0.0–3.0)
MET-HEMOGLOBIN: 0.8 % (ref 0.0–3.0)
MET-HEMOGLOBIN: 0.9 % (ref 0.0–3.0)
O2CT: 20 (ref 15.7–21.6)
O2CT: 16.3 (ref 15.7–21.6)
O2CT: 15.2 — ABNORMAL LOW (ref 15.7–21.6)
OXYHEMOGLOBIN: 95.7 % (ref 85.0–98.0)
OXYHEMOGLOBIN: 96.4 % (ref 85.0–98.0)
OXYHEMOGLOBIN: 95.9 % (ref 85.0–98.0)
PCO2: 30 mmHg — ABNORMAL LOW (ref 33.1–43.1)
PCO2: 32 mmHg — ABNORMAL LOW (ref 33.1–43.1)
PCO2: 38 mmHg (ref 33.1–43.1)
PH: 7.4 (ref 7.350–7.450)
PH: 7.4 (ref 7.350–7.450)
PH: 7.38 (ref 7.350–7.450)
PIO2/FIO2 RATIO: 389 (ref >300)
PIO2/FIO2 RATIO: 407 (ref >300)
PIO2/FIO2 RATIO: 354 (ref >300)
PO2: 109 mmHg — ABNORMAL HIGH (ref 72–100)
PO2: 114 mmHg — ABNORMAL HIGH (ref 72–100)
PO2: 99 mmHg (ref 72–100)
SODIUM: 134 mmol/L — ABNORMAL LOW (ref 136–145)
SODIUM: 134 mmol/L — ABNORMAL LOW (ref 136–145)
SODIUM: 137 mmol/L (ref 136–145)
WHOLE BLOOD K+: 4.3 mmol/L (ref 3.5–5.0)
WHOLE BLOOD K+: 4.3 mmol/L (ref 3.5–5.0)
WHOLE BLOOD K+: 3.5 mmol/L (ref 3.5–5.0)

## 2011-01-02 LAB — TYPE AND CROSS RED CELLS - UNITS
ABO/RH(D): A POS
ANTIBODY SCREEN: NEGATIVE
UNITS ORDERED: 5

## 2011-01-02 LAB — URINALYSIS, MICROSCOPIC
HYALINE CAST: 16 /LPF — ABNORMAL HIGH (ref 0–3)
RBC'S: >182 /HPF — ABNORMAL HIGH (ref 0–4)
WBC'S: 25 /HPF — ABNORMAL HIGH (ref 0–6)

## 2011-01-02 LAB — PREALBUMIN: PREALBUMIN: 9.7 mg/dL — ABNORMAL LOW (ref 18–40)

## 2011-01-02 LAB — ALBUMIN: ALBUMIN: 3.1 g/dL — ABNORMAL LOW (ref 3.2–4.4)

## 2011-01-02 LAB — CREATININE BODY FLUID: CREATININE, FLUID: 9 mg/dL

## 2011-01-02 LAB — ARTERIAL BLOOD GAS/LACTATE/CO-OX/LYTES (NA/K/CA/CL/GLUC)
%FIO2: 28 % (ref 21–100)
HEMATOCRIT: 44 % (ref 33.5–45.2)

## 2011-01-02 LAB — H & H
HCT: 31.8 % — ABNORMAL LOW (ref 33.5–45.2)
HCT: 31.3 % — ABNORMAL LOW (ref 33.5–45.2)
HGB: 10.9 g/dL — ABNORMAL LOW (ref 11.5–15.2)
HGB: 10.9 g/dL — ABNORMAL LOW (ref 11.5–15.2)

## 2011-01-02 LAB — C-REACTIVE PROTEIN(CRP),INFLAMMATION: C-REACTIVE PROTEIN (CRP),INFLAMMATION: 16.366 mg/dL — ABNORMAL HIGH (ref <0.800)

## 2011-01-02 LAB — PHOSPHORUS: PHOSPHORUS: 4.3 mg/dL (ref 2.4–4.7)

## 2011-01-02 LAB — MAGNESIUM: MAGNESIUM: 1.9 mg/dL (ref 1.7–2.5)

## 2011-01-02 MED ORDER — SENNOSIDES 8.8 MG/5 ML ORAL SYRUP
5.00 mL | ORAL_SOLUTION | Freq: Two times a day (BID) | ORAL | Status: DC
Start: 2011-01-02 — End: 2011-01-03
  Administered 2011-01-02 – 2011-01-03 (×2): 0 mg via GASTROSTOMY
  Filled 2011-01-02 (×3): qty 5

## 2011-01-02 MED ORDER — DOCUSATE SODIUM 50 MG/5 ML ORAL LIQUID
100.00 mg | Freq: Two times a day (BID) | ORAL | Status: DC
Start: 2011-01-02 — End: 2011-01-03
  Administered 2011-01-02: 0 mg via GASTROSTOMY
  Administered 2011-01-03: 100 mg via GASTROSTOMY
  Filled 2011-01-02 (×3): qty 10

## 2011-01-02 MED ORDER — PRENATAL VIT-IRON-FOLATE TAB WRAPPER
1.0000 | ORAL_TABLET | Freq: Every day | Status: DC
Start: 2011-01-03 — End: 2011-01-07
  Administered 2011-01-03 – 2011-01-07 (×5): 1 via GASTROSTOMY
  Filled 2011-01-02 (×5): qty 1

## 2011-01-02 MED ORDER — MAGNESIUM SULFATE 2 G IN SW IVPB
2.0000 g | INJECTION | Freq: Once | INTRAVENOUS | Status: AC
Start: 2011-01-02 — End: 2011-01-02
  Administered 2011-01-02: 2 g via INTRAVENOUS
  Filled 2011-01-02: qty 50

## 2011-01-02 MED ORDER — SODIUM CHLORIDE 0.9 % INTRAVENOUS SOLUTION
INTRAVENOUS | Status: DC
Start: 2011-01-02 — End: 2011-01-03

## 2011-01-02 MED ORDER — INSULIN REGULAR HUMAN 100 UNIT/ML INJ FOR MIXTURES -RX ADMIN UNIT ROUNDS TO NEAREST 0.01 ML
1.0000 [IU]/h | INTRAMUSCULAR | Status: DC
Start: 2011-01-02 — End: 2011-01-03
  Administered 2011-01-02: 3.5 [IU]/h via INTRAVENOUS
  Administered 2011-01-02: 2 [IU]/h via INTRAVENOUS
  Administered 2011-01-02: 3 [IU]/h via INTRAVENOUS
  Administered 2011-01-02: 4 [IU]/h via INTRAVENOUS
  Administered 2011-01-02 (×3): 0 [IU]/h via INTRAVENOUS
  Filled 2011-01-02: qty 2.5

## 2011-01-02 MED ORDER — DEXTROSE 50 % IN WATER (D50W) INTRAVENOUS SYRINGE
12.5000 g | INJECTION | INTRAVENOUS | Status: DC | PRN
Start: 2011-01-02 — End: 2011-01-03

## 2011-01-02 MED ORDER — ESOMEPRAZOLE MAGNESIUM 40 MG CAPSULE,DELAYED RELEASE
40.00 mg | DELAYED_RELEASE_CAPSULE | Freq: Every morning | ORAL | Status: DC
Start: 2011-01-03 — End: 2011-01-07
  Administered 2011-01-03 – 2011-01-07 (×5): 40 mg via GASTROSTOMY
  Filled 2011-01-02 (×5): qty 1

## 2011-01-02 MED ORDER — SIMVASTATIN 20 MG TABLET
40.00 mg | ORAL_TABLET | Freq: Every evening | ORAL | Status: DC
Start: 2011-01-02 — End: 2011-01-07
  Administered 2011-01-02 – 2011-01-06 (×5): 40 mg via GASTROSTOMY
  Filled 2011-01-02 (×6): qty 2

## 2011-01-02 MED ORDER — SODIUM CHLORIDE 0.9 % INTRAVENOUS SOLUTION
INTRAVENOUS | Status: DC
Start: 2011-01-02 — End: 2011-01-02

## 2011-01-02 MED ORDER — CITALOPRAM 40 MG TABLET
40.0000 mg | ORAL_TABLET | Freq: Every day | ORAL | Status: DC
Start: 2011-01-03 — End: 2011-01-07
  Administered 2011-01-03 – 2011-01-07 (×5): 40 mg via GASTROSTOMY
  Filled 2011-01-02 (×5): qty 1

## 2011-01-02 MED ORDER — INSULIN GLARGINE (U-100) 100 UNIT/ML SUBCUTANEOUS SOLUTION
30.00 [IU] | Freq: Every evening | SUBCUTANEOUS | Status: DC
Start: 2011-01-03 — End: 2011-01-07
  Administered 2011-01-03 – 2011-01-06 (×4): 30 [IU] via SUBCUTANEOUS
  Filled 2011-01-02 (×4): qty 30

## 2011-01-02 MED ORDER — INSULIN U-100 REGULAR HUMAN 100 UNIT/ML INJECTION SOLUTION
4.0000 [IU] | INTRAMUSCULAR | Status: DC | PRN
Start: 2011-01-02 — End: 2011-01-03
  Filled 2011-01-02: qty 3

## 2011-01-02 MED ORDER — ACETAMINOPHEN 325 MG TABLET
650.00 mg | ORAL_TABLET | Freq: Four times a day (QID) | ORAL | Status: DC | PRN
Start: 2011-01-02 — End: 2011-01-07
  Administered 2011-01-06: 650 mg via GASTROSTOMY
  Filled 2011-01-02: qty 2

## 2011-01-02 MED ORDER — DEXTROSE 5 % AND 0.9 % SODIUM CHLORIDE INTRAVENOUS SOLUTION
INTRAVENOUS | Status: DC
Start: 2011-01-02 — End: 2011-01-02

## 2011-01-02 NOTE — Anesthesia Postprocedure Evaluation (Signed)
ANESTHESIA POSTOP EVALUATION NOTE        Anesthesia Service      [facility]     01/02/2011      Last Vitals: Temperature: 36.8 C (98.2 F) (01/02/11 0400)  Heart Rate: 80  (01/02/11 0500)  BP (Non-Invasive): 147/62 mmHg (01/02/11 0500)  Respiratory Rate: 16  (01/02/11 0500)  SpO2-1: 99 % (01/02/11 0500)  Weight: 94.6 kg (208 lb 8.9 oz) (01/02/11 0400)  Procedure(s) Performed:  CYSTECTOMY RADICAL; PLACEMENT CONDUIT URINARY - URINARY DIVERSION  Patient is sufficiently recovered from the effects of anesthesia to participate in the evaluation and has returned to their pre-procedure level.  I have reviewed and evaluated the following:  Respiratory Function: Consistent with pre anesthetic level  Cardiovascular Function: Consistent with pre anesthetic level  Mental Status: Return to pre anesthetic baseline level    Comment/ re-evaluation for any variations: None    Faculty Note:  I examined the patient and agree with the above details of postoperative assessment.

## 2011-01-02 NOTE — Progress Notes (Addendum)
[facility]                                               SICU PROGRESS NOTE    Christy Hale, Christy Hale  Date of Admission:  12/31/2010  Date of Service: 01/02/2011  Date of Birth:  28-Mar-1941    Primary Attending: Marzetta Board  Primary Service: Urology    Post Op Day:2 Days Post-Op S/P Procedure(s) (LRB):  CYSTECTOMY RADICAL (N/A)  PLACEMENT CONDUIT URINARY (N/A)  Subjective: Feels mildly short of breath. Pain controlled. No chest pain. No acute events overnight.    Vital Signs:  Temp (24hrs) Max:38.7 C (101.7 F)      Systolic (24hrs), Avg:157 mmHg, Min:147 mmHg, Max:167 mmHg    Diastolic (24hrs), Avg:60 mmHg, Min:58 mmHg, Max:62 mmHg    Temp  Avg: 37.2 C (99 F)  Min: 36.6 C (97.9 F)  Max: 38.7 C (101.7 F)  Pulse  Avg: 82.3   Min: 76   Max: 90   Resp  Avg: 26   Min: 13   Max: 63   SpO2  Avg: 97.8 %  Min: 95 %  Max: 100 %  MAP (Non-Invasive)  Avg: 93 mmHG  Min: 88 mmHG  Max: 98 mmHG  Pain Score: 0    Hemodynamics:  CVP: 2 MM HG  ART-Line  MAP: 78 mmHg      Blood Gas Results:  Recent Labs   Basename 01/02/11 0139 01/01/11 2220 01/01/11 1544   . SPECIMENTYPE ARTERIAL ARTERIAL ARTERIAL   . FI02 28 21 32   . PH 7.400 7.340* 7.260*   . PCO2 30.0* 30.0* 31.0*   . PO2 109* 143* 110*   . BICARBONATE 21.1 18.4 15.5*   . BASEEXCESS Test Not Performed Test Not Performed Test Not Performed   . BASEDEFICIT 4.9* 8.4* 12.0*   . PFRATIO [phone]       Current Medications:    Current facility-administered medications:  insulin regular human (HUMULIN R) 250 Units in NS 250 mL infusion 1 Units/hr Intravenous Continuous   insulin R human (HUMULIN R) 100 units/mL injection  4-10 Units Intravenous Q1H PRN   dextrose 50% (0.5 g/mL) injection  12.5 g Intravenous Q1H PRN   sodium phosphate 15 mmol in D5W 100 mL IVPB 15 mmol Intravenous Once   prenatal vitamin-iron fumarate-folic acid  (NATALCARE PLUS) 27-0.8mg  per tablet  1 Tab Oral Daily    potassium chloride 20 mEq in SW 50 mL premix infusion  20 mEq Intravenous Once   insulin glargine (LANTUS) 100 units/mL injection  10 Units Subcutaneous 2x/day   D5W 1,000 mL with sodium bicarbonate 150 mEq infusion  Intravenous Continuous   cefTRIAXone (ROCEPHIN) 1 g in NS 50 mL IVPB 1 g Intravenous Q24H   HYDROmorphone (DILAUDID) 1 mg/mL (tot vol 100 mL) in NS PCA  1 Bag Intravenous Q1H PRN   diphenhydrAMINE (BENADRYL) 50 mg/mL injection  6.5 mg Intravenous Q6H PRN   citalopram (CELEXA) tablet  40 mg Oral Daily   SSIP insulin R human (HUMULIN R) 100 units/mL injection  4-12 Units Subcutaneous 4x/day PRN   acetaminophen (TYLENOL) tablet  650 mg Oral Q6H PRN   ondansetron (ZOFRAN) 2 mg/mL injection  4 mg Intravenous Q6H PRN   NS 10 mL injection  2 mL Intracatheter Q8HRS   NS 10 mL injection  2-6 mL Intracatheter  Q1 MIN PRN   simvastatin (ZOCOR) tablet  40 mg Oral QPM   esomeprazole (NEXIUM) capsule  40 mg Oral QAM   HYDROmorphone (DILAUDID) 1 mg/mL injection  0.4 mg Intravenous Q2H PRN   labetalol (TRANDATE) 5 mg/mL injection  5 mg Intravenous Q2H PRN   hydrALAZINE (APRESOLINE) injection 10 mg 10 mg Intravenous Q4H PRN   sennosides (SENOKOT) 8.8mg  per 5mL oral liquid  5 mL Oral 2x/day   docusate sodium (COLACE) 10mg  per mL oral liquid  50 mg Oral 2x/day         Today's Physical Exam:  Vitals reviewed  General: no distress  Eyes: Conjunctiva injected., Pupils equal and round.   HENT:Mouth mucous membranes moist.   Neck: supple, symmetrical, trachea midline  Lungs: clear to auscultation bilaterally.   Cardiovascular: RRR, no r/m/g, good peripheral pulses  Abdomen: generalized TTP, soft, non-distended  Extremities: 2+ pitting edema  Skin: erythema with mild rubor overlaying low abdominal incision but has not spread outside mark  Neurologic: AOx2 (person and place but no time), grossly normal, CN intact  Psychiatric: affect normal      Labs:   Results for orders placed during the hospital encounter of 12/31/10 (from the past 12 hour(s))   BLOOD GAS/COOX/LYTES/LAC/HCT (PICU/NICU ONLY)   Component Value Range   . %FIO2 21  21 - 100 (%)   . PH 7.340 (*) 7.350 - 7.450    . PCO2 30.0 (*) 33.1 - 43.1 (mm Hg)   . PO2 143 (*) 72 - 100 (mm Hg)   . BICARBONATE 18.4  18.0 - 29.0 (mmol/L)   . BASE EXCESS Test Not Performed  0.0 - 3.0 (mmol/L)   . BASE DEFICIT 8.4 (*) 0.0 - 3.0 (mmol/L)   . SPECIMEN TYPE ARTERIAL     . PIO2/FIO2 RATIO 681  >300    . HEMOGLOBIN 11.4 (*) 12.0 - 16.0 (g/dL)   . OXYHEMOGLOBIN 95.8  85.0 - 98.0 (%)   . CARBOXYHEMOGLOBIN 1.4  0.0 - 2.5 (%)   . MET-HEMOGLOBIN 1.6  0.0 - 3.0 (%)   . O2CT 15.6 (*) 15.7 - 21.6    . RTCX SPEC TYPE ARTERIAL     . WHOLE BLOOD K+ 3.8  3.5 - 5.0 (mmol/L)   . IONIZED CALCIUM 1.14 (*) 1.30 - 1.46 (mmol/L)   . SODIUM 135 (*) 136 - 145 (mmol/L)   . LACTATE 0.8  <1.3 (mmol/L)   . CHLORIDE 106  96 - 111 (mmol/L)   . GLUCOSE 259 (*) 70 - 105 (mg/dL)   . HEMATOCRIT 34  33.5 - 45.2 (%)   PREALBUMIN   Component Value Range   . PREALBUMIN 9.7 (*) 18 - 40 (mg/dL)   ALBUMIN   Component Value Range   . ALBUMIN 3.1 (*) 3.2 - 4.4 (g/dL)   C-REACTIVE PROTEIN(CRP),INFLAMMATION   Component Value Range   . C-Reactive Protein High Sensitivity (Inflammation) 16.366 (*) <0.800 (mg/dL)   CBC/DIFF   Component Value Range   . WBC 18.4 (*) 3.5 - 11.0 (THOU/uL)   . RBC 3.87  3.84 - 5.04 (MIL/uL)   . HGB 11.4 (*) 11.5 - 15.2 (g/dL)   . HCT 33.4 (*) 33.5 - 45.2 (%)   . MCV 86.4  82.0 - 99.0 (fL)   . MCH 29.4  27.4 - 33.0 (pg)   . MCHC 34.1  31.6 - 35.5 (g/dL)   . RDW 13.4  10.2 - 14.0 (%)   . PLATELET COUNT 100 (*) 140 - 450 (  THOU/uL)   . MPV 8.0  7.4 - 10.4 (FL)   . PMN'S 84 (*) 40 - 75 (%)   . PMN ABS 15.500 (*) 1.5 - 7.7 (THOU/uL)   . LYMPHOCYTES 8 (*) 20 - 45 (%)   . LYMPHS ABS 1.450  1.0 - 4.8 (THOU/uL)   . MONOCYTES 8  4 - 13 (%)   . MONOS ABS 1.420 (*) 0.1 - 0.9 (THOU/uL)   . EOSINOPHIL 0 (*) 1 - 6 (%)    . EOS ABS 0.028 (*) 0.1 - 0.3 (THOU/uL)   . BASOPHILS 0  0 - 1 (%)   . BASOS ABS 0.003  0.0 - 0.2 (THOU/uL)   . NRBC'S 0  0 (/100WBC)   BASIC METABOLIC PANEL, NON-FASTING   Component Value Range   . SODIUM 135 (*) 136 - 145 (mmol/L)   . POTASSIUM 3.9  3.5 - 5.1 (mmol/L)   . CHLORIDE 106  96 - 111 (mmol/L)   . CARBON DIOXIDE 18 (*) 22 - 32 (mmol/L)   . ANION GAP 11  5 - 16 (mmol/L)   . CREATININE 2.78 (*) 0.49 - 1.10 (mg/dL)   . ESTIMATED GLOMERULAR FILTRATION RATE 17 (*) >59 (ml/min/1.63m2)   . GLUCOSE,NONFAST 279 (*) 65 - 139 (mg/dL)   . BUN 31 (*) 6 - 20 (mg/dL)   . BUN/CREAT RATIO 11  6 - 22    . CALCIUM 8.4 (*) 8.5 - 10.4 (mg/dL)   MAGNESIUM   Component Value Range   . MAGNESIUM 1.9  1.7 - 2.5 (mg/dL)   PHOSPHORUS   Component Value Range   . PHOSPHORUS 4.3  2.4 - 4.7 (mg/dL)   BLOOD GAS/COOX/LYTES/LAC/HCT (PICU/NICU ONLY)   Component Value Range   . %FIO2 28  21 - 100 (%)   . PH 7.400  7.350 - 7.450    . PCO2 30.0 (*) 33.1 - 43.1 (mm Hg)   . PO2 109 (*) 72 - 100 (mm Hg)   . BICARBONATE 21.1  18.0 - 29.0 (mmol/L)   . BASE EXCESS Test Not Performed  0.0 - 3.0 (mmol/L)   . BASE DEFICIT 4.9 (*) 0.0 - 3.0 (mmol/L)   . SPECIMEN TYPE ARTERIAL     . PIO2/FIO2 RATIO 389  >300    . HEMOGLOBIN 14.8  12.0 - 16.0 (g/dL)   . OXYHEMOGLOBIN 95.7  85.0 - 98.0 (%)   . CARBOXYHEMOGLOBIN 1.3  0.0 - 2.5 (%)   . MET-HEMOGLOBIN 0.8  0.0 - 3.0 (%)   . O2CT 20.0  15.7 - 21.6    . RTCX SPEC TYPE ARTERIAL     . WHOLE BLOOD K+ 4.3  3.5 - 5.0 (mmol/L)   . IONIZED CALCIUM 1.22 (*) 1.30 - 1.46 (mmol/L)   . SODIUM 134 (*) 136 - 145 (mmol/L)   . LACTATE 1.2  <1.3 (mmol/L)   . CHLORIDE 104  96 - 111 (mmol/L)   . GLUCOSE 282 (*) 70 - 105 (mg/dL)   . HEMATOCRIT 44  33.5 - 45.2 (%)   BASIC METABOLIC PANEL, NON-FASTING   Component Value Range   . SODIUM 137  136 - 145 (mmol/L)   . POTASSIUM 4.2  3.5 - 5.1 (mmol/L)   . CHLORIDE 108  96 - 111 (mmol/L)   . CARBON DIOXIDE 20 (*) 22 - 32 (mmol/L)   . ANION GAP 9  5 - 16 (mmol/L)    . CREATININE 3.10 (*) 0.49 - 1.10 (mg/dL)   . ESTIMATED GLOMERULAR FILTRATION  RATE 15 (*) >59 (ml/min/1.60m2)   . GLUCOSE,NONFAST 238 (*) 65 - 139 (mg/dL)   . BUN 32 (*) 6 - 20 (mg/dL)   . BUN/CREAT RATIO 10  6 - 22    . CALCIUM 8.7  8.5 - 10.4 (mg/dL)           I/O:  I/O last 24 hours:    Intake/Output Summary (Last 24 hours) at 01/02/11 0713  Last data filed at 01/02/11 0700   Gross per 24 hour   Intake 2945.45 ml   Output   3990 ml   Net -1044.55 ml       I/O current shift:  03/15 0000 - 03/15 0759  In: 445 [I.V.:445]  Out: 765 [Urine:15; Drains:550; NG/OG/GT:200]    Drips:   IV Drips   Medication Dose Frequency Last Rate   . insulin regular human (HUMULIN R) 250 Units in NS 250 mL infusion  1 Units/hr Continuous 3 Units/hr (01/02/11 0600)   . D5W 1,000 mL with sodium bicarbonate 150 mEq infusion   Continuous 50 mL/hr (01/01/11 1800)   . HYDROmorphone (DILAUDID) 1 mg/mL (tot vol 100 mL) in NS PCA   1 Bag Q1H PRN 1 Bag (01/01/11 1815)   . dexmedetomidine (PRECEDEX) 200 mcg in NS 50mL (tot vol) infusion   0.2 mcg/kg/hr Continuous Stopped (01/01/11 1000)   . DISCONTD: insulin regular human (HUMULIN R) 250 Units in NS 250 mL infusion  2 Units/hr Continuous Stopped (01/01/11 1033)   . DISCONTD: LR premix infusion    Continuous 150 mL/hr (01/01/11 0112)       Assessment/ Plan:   Active Hospital Problems   Diagnoses   . Primary Problem: Bladder cancer   . Metabolic acidosis   . Hypomagnesemia   . Hx of total cystectomy   . Hx of unilateral nephrectomy   . Respiratory failure following trauma and surgery   . AKI (acute kidney injury)   . Type 2 diabetes mellitus   . Hyperlipidemia       Neuro  GCS 15; Oriented x2 (not to time)  Tyelnol  Dilaudid PCA  Celexa  Currently reports good pain control with PCA  Out of bed today    Resp/Vent  Weaning nasal cannula. Now on 2L.   Oxygen Saturations 95-100%  ABG 7.4/30/109/21  Continue aggressive pulmonary toilet, out of bed as above.    CV  MAP 88-98  HR 76-90  CVP 2-8   HLD - Zocor  Labetolol and Hydralazine (last dose at 04:00) PRN    Renal/Lytes  UOP declining.with 0-5 ml/hr  Creatinine increasing 3.10 (from 2.78 yesterday)  FENa 18% (inaccurate due to lasix yesterday)  UA: +ketones, protein and leuks  Micro UA: WBC >112  Hypokalemia - replaced  Hypomagnesium  D5 with Bicarb @50ml /hr  Acute rena failure-likely secondary to ATN/hydronephrosis.  Appreciate nephrology following.  Metabolic acidosis-improved, now off bicarb gtt.  Will follow q 8hrs for now  Cont minimal IVF in setting of minimal urine output      GI/Nutrition  NPO. Advance diet.  Albumin 3.1/Prealbumin 9.7  Nexium, Colace, Senokot  Zofran  Prenatal vit  Foley drain output trending down (10-700 ml/hr)  Malnutrition-advance diet hopefully tomorrow as per urology    Heme  H/H 11.4/33.4  PLT 100    ID/ABX/Lines  Afebrile overnight  Ceftriaxone Day #3 (post-or ppx)    Endo  accuchecks q hour  Insulin drip for hyperglycemia    Skin  Benadryl 6.5mg  q6 for itching  Iona Beard, MD 01/02/2011, 7:13 AM        SICU Attending Note 01/02/2011    I performed a history and physical exam of the patient and discussed management with the resident. Please refer to SICU note dated 01/02/2011 I reviewed the resident's note and agree with the documented findings and plan of care except as noted with the following additions noted in bold and color above.  Pt updated today at bedside, questions answered.      Critical Care Attestation     I was present at the bedside of this critically ill patient for 50 minutes exclusive of procedures.  This patient suffers from failure or dysfunction of 5 system(s).  The care of this patient was in regard to managing (a) conditions(s) that has a high probability of sudden, clinically significant, or life-threatening deterioration and required a high degree of Attending Physician attention and direct involvement to intervene urgently. Data review and care planning was performed in direct proximity of the patient, examination was obviously performed in direct contact with the patient. All of this time was exclusive of procedure which will be documented elsewhere in the chart.    My critical care time involved full attention to the patients' condition and included:    Review of nursing notes and/or old charts  Review of medications, allergies, and vital signs  Documentation time  Consultant collaboration on findings and treatment options  Care, transfer of care, and discharge plans  Ordering, interpreting, and reviewing diagnostic studies/tab tests  Obtaining necessary history from family, EMS, nursing home staff and/or treating physicians    My critical care time did not include time spent teaching resident physician(s) or other services of resident physicians, or performing other reported procedures.  Total Critical Care Time: 50 minutes    Electronically Signed:   Lowella Petties, MD 01/02/2011, 4:04 PM      I saw and examined the patient.  I reviewed the resident's note.  I agree with the findings and plan of care as documented in the resident's note.  Any exceptions/additions are edited/noted.    Lowella Petties, MD 01/02/2011, 4:05 PM

## 2011-01-02 NOTE — CDI REVIEW (Addendum)
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 S/p left nephrectomy in 2009.   Patient is still slightly sedated from surgery but denies any pain.  Metabolic acidosis   hypomag  Metabolic acidosis and large BD and anuria  H&H 9.7/28 (Pre-op on 3/2 12.1/36.1)  hypovolemia post-op    3/14 - sicu -   AKI (acute kidney injury  Anemia likely secondary to dilution after fluids, will follow. Currently no evidence of bleeding.     urol - Remains in SICU   Stoma nursing to see patient   Will send Foley drain for Cr   Recc Aggressive fluid resuscitation    neph - She has only right kidney for last four years after nephrectomy of right kidney secondary to TCC. Looks likely her creatinine on 12/210 was 1.27 and has increased to 1.79 post surgically. She has a long surgery lasted for seven hours. Post op her blood pressure was 85/51 mm of Hg. Seems likely she has hypotensive injury to kidney. It could be secondary to post renal as she has mild hydronephrosis on renal USG or could be renal etiology  AKI after long surgery, suggesting ischemic ATN. New hydronephrosis after surgery could be normal adaptation response after surgery    3/15 - sicu - Respiratory failure following trauma and surgery   UOP declining.with 0-5 ml/hr    Creatinine increasing 3.10 (from 2.78 yesterday)   FENa 18% (inaccurate due to lasix yesterday)   UA: +ketones, protein and leuks   Micro UA: WBC >112   Hypokalemia - replaced   Hypomagnesium    urol -   remains in SICU, extubated yesterday     Diagnostics and Results:   Procedures and Treatments:   Meds, IV's, Rx Blood:     Comments:

## 2011-01-02 NOTE — Consults (Addendum)
[facility]  Nephrology Consult Follow Up Note    Date of service: 01/02/2011  Hospital Day:  LOS: 2 days     SUBJECTIVE:   This is a 70 y/o female with PMH significant for TCC of the left renal pelvis, s/p left nephroureterectomy four years ago, now admitted to hospital with bladder mass s/p Radical cystectomy with creation of an ileoconduit diversion on 12/31/10, now found to have deteriorating kidney functions. This morning Christy Hale said that she is getting better. She denied any abdominal pain or chest discomfort.   OBJECTIVE:    Vital Signs:  Temp (24hrs) Max:37 C (98.6 F)      Temperature: 37 C (98.6 F)  BP (Non-Invasive): 139/44 mmHg  MAP (Non-Invasive): 60 mmHG  Heart Rate: 85   Respiratory Rate: 35   Pain Score: 0  SpO2-1: 98 %    Systolic (24hrs), Avg:154 mmHg, Min:139 mmHg, Max:167 mmHg    Diastolic (24hrs), Avg:56 mmHg, Min:44 mmHg, Max:62 mmHg    MAP:  MAP (Non-Invasive)  Avg: 83.7 mmHG  Min: 52 mmHG  Max: 117 mmHG  Heart Rate:  Pulse  Avg: 79.5   Min: 60   Max: 90     Dialysis treatment total fluid removal:     Post dialysis weight:       Diet Order:  NPO  Nutrition:  Orders Placed This Encounter   Procedure   . MNT PROTOCOL FOR DIETITIAN       IV Fluids, Meds, and Drips:      insulin regular human (HUMULIN R) 250 units in NS infusion Last Rate: 2 Units/hr (01/02/11 1209)   NS Last Rate: 50 mL/hr (01/02/11 1100)   D5W NS Last Rate: 50 mL/hr (01/02/11 1230)   hydromorphone in NS Last Rate: 1 Bag (01/01/11 1815)   DISCONTD: sodium bicarbonate in D5W infusion Last Rate: 50 mL/hr (01/01/11 1800)   dexmedetomidine (PRECEDEX) 200 mcg in NS 50mL (tot vol) infusion Last Rate: Stopped (01/01/11 1000)         I/O:  I/O last 24 hours:    Intake/Output Summary (Last 24 hours) at 01/02/11 1558  Last data filed at 01/02/11 1300   Gross per 24 hour   Intake 1393.73 ml   Output   4179 ml   Net -2785.27 ml       I/O current shift:  03/15 0800 - 03/15 1559   In: 441.73 [P.O.:90; I.V.:351.73]  Out: 429 [Urine:43; Drains:286; NG/OG/GT:100]  I/O last 3 completed shifts:  In: 3004.45 [P.O.:140; I.V.:2864.45]  Out: 4100 [Urine:425; Drains:3375; NG/OG/GT:300]    Labs:  Lab Results for Last 24 Hours:    Results for orders placed during the hospital encounter of 12/31/10 (from the past 24 hour(s))   CREATININE URINE, RANDOM   Component Value Range   . CREATININE, UR RAND <10 (*) NO REFERENCE RANGES ESTABLISHED (mg/dl)   PROTEIN, TOTAL URINE, RANDOM   Component Value Range   . PROTEIN, URINE, RANDOM 626 (*) <30 (mg/dL)   SODIUM, RANDOM URINE   Component Value Range   . SODIUM, URINE RAND 138 (*) NO REFERENCE RANGES ESTABLISHED (mmol/L)   POTASSIUM,RANDOM URINE   Component Value Range   . POTASSIUM, UR RAND 6 (*) NO REFERENCE RANGES ESTABLISHED (mmol/L)   CHLORIDE,RANDOM URINE   Component Value Range   . CHLORIDE, URINE RAND 117 (*) NO REFERENCE RANGES ESTABLISHED (mmol/L)   CALCIUM,RANDOM URINE   Component Value Range   . CALCIUM, URINE RAND 7.9 (*) NO  REFERENCE RANGES ESTABLISHED (mg/dl)   URINALYSIS   Component Value Range   . CHARACTER CLOUDY     . COLOR YELLOW     . SPECIFIC GRAVITY, URINE 1.008  1.005 - 1.030    . GLUCOSE 150 (*) NEGATIVE (mg/dL)   . BILIRUBIN NEGATIVE  NEGATIVE    . KETONES 10 (*) NEGATIVE (mg/dL)   . BLOOD LARGE (*) NEGATIVE    . PH URINE 8.0  5.0 - 8.0    . PROTEIN 300 (*) NEGATIVE (mg/dL)   . UROBILINOGEN NORMAL  0.2 (mg/dL)   . NITRITE NEGATIVE  NEGATIVE    . LEUKOCYTES MODERATE (*) NEGATIVE    MICROSCOPIC URINALYSIS   Component Value Range   . RBC'S   (*) 0 - 4 (/HPF)    Value: >182  MICROSCOPIC ANALYSIS PERFORMED BY AUTOMATED METHOD   . WBC'S 112 (*) 0 - 6 (/HPF)   . BACTERIA OCCASIONAL OR LESS  OCL^OCCASIONAL OR LESS    BLOOD GAS/COOX/LYTES/LAC/HCT (PICU/NICU ONLY)   Component Value Range   . %FIO2 21  21 - 100 (%)   . PH 7.340 (*) 7.350 - 7.450    . PCO2 30.0 (*) 33.1 - 43.1 (mm Hg)   . PO2 143 (*) 72 - 100 (mm Hg)    . BICARBONATE 18.4  18.0 - 29.0 (mmol/L)   . BASE EXCESS Test Not Performed  0.0 - 3.0 (mmol/L)   . BASE DEFICIT 8.4 (*) 0.0 - 3.0 (mmol/L)   . SPECIMEN TYPE ARTERIAL     . PIO2/FIO2 RATIO 681  >300    . HEMOGLOBIN 11.4 (*) 12.0 - 16.0 (g/dL)   . OXYHEMOGLOBIN 95.8  85.0 - 98.0 (%)   . CARBOXYHEMOGLOBIN 1.4  0.0 - 2.5 (%)   . MET-HEMOGLOBIN 1.6  0.0 - 3.0 (%)   . O2CT 15.6 (*) 15.7 - 21.6    . RTCX SPEC TYPE ARTERIAL     . WHOLE BLOOD K+ 3.8  3.5 - 5.0 (mmol/L)   . IONIZED CALCIUM 1.14 (*) 1.30 - 1.46 (mmol/L)   . SODIUM 135 (*) 136 - 145 (mmol/L)   . LACTATE 0.8  <1.3 (mmol/L)   . CHLORIDE 106  96 - 111 (mmol/L)   . GLUCOSE 259 (*) 70 - 105 (mg/dL)   . HEMATOCRIT 34  33.5 - 45.2 (%)   PREALBUMIN   Component Value Range   . PREALBUMIN 9.7 (*) 18 - 40 (mg/dL)   ALBUMIN   Component Value Range   . ALBUMIN 3.1 (*) 3.2 - 4.4 (g/dL)   C-REACTIVE PROTEIN(CRP),INFLAMMATION   Component Value Range   . C-Reactive Protein High Sensitivity (Inflammation) 16.366 (*) <0.800 (mg/dL)   CBC/DIFF   Component Value Range   . WBC 18.4 (*) 3.5 - 11.0 (THOU/uL)   . RBC 3.87  3.84 - 5.04 (MIL/uL)   . HGB 11.4 (*) 11.5 - 15.2 (g/dL)   . HCT 33.4 (*) 33.5 - 45.2 (%)   . MCV 86.4  82.0 - 99.0 (fL)   . MCH 29.4  27.4 - 33.0 (pg)   . MCHC 34.1  31.6 - 35.5 (g/dL)   . RDW 13.4  10.2 - 14.0 (%)   . PLATELET COUNT 100 (*) 140 - 450 (THOU/uL)   . MPV 8.0  7.4 - 10.4 (FL)   . PMN'S 84 (*) 40 - 75 (%)   . PMN ABS 15.500 (*) 1.5 - 7.7 (THOU/uL)   . LYMPHOCYTES 8 (*) 20 - 45 (%)   .  LYMPHS ABS 1.450  1.0 - 4.8 (THOU/uL)   . MONOCYTES 8  4 - 13 (%)   . MONOS ABS 1.420 (*) 0.1 - 0.9 (THOU/uL)   . EOSINOPHIL 0 (*) 1 - 6 (%)   . EOS ABS 0.028 (*) 0.1 - 0.3 (THOU/uL)   . BASOPHILS 0  0 - 1 (%)   . BASOS ABS 0.003  0.0 - 0.2 (THOU/uL)   . NRBC'S 0  0 (/100WBC)   BASIC METABOLIC PANEL, NON-FASTING   Component Value Range   . SODIUM 135 (*) 136 - 145 (mmol/L)   . POTASSIUM 3.9  3.5 - 5.1 (mmol/L)   . CHLORIDE 106  96 - 111 (mmol/L)    . CARBON DIOXIDE 18 (*) 22 - 32 (mmol/L)   . ANION GAP 11  5 - 16 (mmol/L)   . CREATININE 2.78 (*) 0.49 - 1.10 (mg/dL)   . ESTIMATED GLOMERULAR FILTRATION RATE 17 (*) >59 (ml/min/1.31m2)   . GLUCOSE,NONFAST 279 (*) 65 - 139 (mg/dL)   . BUN 31 (*) 6 - 20 (mg/dL)   . BUN/CREAT RATIO 11  6 - 22    . CALCIUM 8.4 (*) 8.5 - 10.4 (mg/dL)   MAGNESIUM   Component Value Range   . MAGNESIUM 1.9  1.7 - 2.5 (mg/dL)   PHOSPHORUS   Component Value Range   . PHOSPHORUS 4.3  2.4 - 4.7 (mg/dL)   POCT WHOLE BLOOD GLUCOSE   Component Value Range   . GLUCOSE, POINT OF CARE 292 (*) 70 - 105 (mg/dL)   BLOOD GAS/COOX/LYTES/LAC/HCT (PICU/NICU ONLY)   Component Value Range   . %FIO2 28  21 - 100 (%)   . PH 7.400  7.350 - 7.450    . PCO2 30.0 (*) 33.1 - 43.1 (mm Hg)   . PO2 109 (*) 72 - 100 (mm Hg)   . BICARBONATE 21.1  18.0 - 29.0 (mmol/L)   . BASE EXCESS Test Not Performed  0.0 - 3.0 (mmol/L)   . BASE DEFICIT 4.9 (*) 0.0 - 3.0 (mmol/L)   . SPECIMEN TYPE ARTERIAL     . PIO2/FIO2 RATIO 389  >300    . HEMOGLOBIN 14.8  12.0 - 16.0 (g/dL)   . OXYHEMOGLOBIN 95.7  85.0 - 98.0 (%)   . CARBOXYHEMOGLOBIN 1.3  0.0 - 2.5 (%)   . MET-HEMOGLOBIN 0.8  0.0 - 3.0 (%)   . O2CT 20.0  15.7 - 21.6    . RTCX SPEC TYPE ARTERIAL     . WHOLE BLOOD K+ 4.3  3.5 - 5.0 (mmol/L)   . IONIZED CALCIUM 1.22 (*) 1.30 - 1.46 (mmol/L)   . SODIUM 134 (*) 136 - 145 (mmol/L)   . LACTATE 1.2  <1.3 (mmol/L)   . CHLORIDE 104  96 - 111 (mmol/L)   . GLUCOSE 282 (*) 70 - 105 (mg/dL)   . HEMATOCRIT 44  33.5 - 45.2 (%)   POCT WHOLE BLOOD GLUCOSE   Component Value Range   . GLUCOSE, POINT OF CARE 254 (*) 70 - 105 (mg/dL)   BASIC METABOLIC PANEL, NON-FASTING   Component Value Range   . SODIUM 137  136 - 145 (mmol/L)   . POTASSIUM 4.2  3.5 - 5.1 (mmol/L)   . CHLORIDE 108  96 - 111 (mmol/L)   . CARBON DIOXIDE 20 (*) 22 - 32 (mmol/L)   . ANION GAP 9  5 - 16 (mmol/L)   . CREATININE 3.10 (*) 0.49 - 1.10 (mg/dL)   . ESTIMATED  GLOMERULAR FILTRATION RATE 15 (*) >59 (ml/min/1.74m2)    . GLUCOSE,NONFAST 238 (*) 65 - 139 (mg/dL)   . BUN 32 (*) 6 - 20 (mg/dL)   . BUN/CREAT RATIO 10  6 - 22    . CALCIUM 8.7  8.5 - 10.4 (mg/dL)   BLOOD GAS/COOX/LYTES/LAC/HCT (PICU/NICU ONLY)   Component Value Range   . %FIO2 28  21 - 100 (%)   . PH 7.400  7.350 - 7.450    . PCO2 32.0 (*) 33.1 - 43.1 (mm Hg)   . PO2 114 (*) 72 - 100 (mm Hg)   . BICARBONATE 21.6  18.0 - 29.0 (mmol/L)   . BASE EXCESS Test Not Performed  0.0 - 3.0 (mmol/L)   . BASE DEFICIT 4.2 (*) 0.0 - 3.0 (mmol/L)   . SPECIMEN TYPE ARTERIAL     . PIO2/FIO2 RATIO 407  >300    . HEMOGLOBIN 11.9 (*) 12.0 - 16.0 (g/dL)   . OXYHEMOGLOBIN 96.4  85.0 - 98.0 (%)   . CARBOXYHEMOGLOBIN 0.4  0.0 - 2.5 (%)   . MET-HEMOGLOBIN 0.8  0.0 - 3.0 (%)   . O2CT 16.3  15.7 - 21.6    . RTCX SPEC TYPE ARTERIAL     . WHOLE BLOOD K+ 4.3  3.5 - 5.0 (mmol/L)   . IONIZED CALCIUM 1.21 (*) 1.30 - 1.46 (mmol/L)   . SODIUM 134 (*) 136 - 145 (mmol/L)   . LACTATE 1.1  <1.3 (mmol/L)   . CHLORIDE 104  96 - 111 (mmol/L)   . GLUCOSE 242 (*) 70 - 105 (mg/dL)   . HEMATOCRIT 36  33.5 - 45.2 (%)   POCT WHOLE BLOOD GLUCOSE   Component Value Range   . GLUCOSE, POINT OF CARE 175 (*) 70 - 105 (mg/dL)   CREATININE BODY FLUID   Component Value Range   . CREATININE, FLUID 9.0  (mg/dL)   POCT WHOLE BLOOD GLUCOSE   Component Value Range   . GLUCOSE, POINT OF CARE 151 (*) 70 - 105 (mg/dL)   POCT WHOLE BLOOD GLUCOSE   Component Value Range   . GLUCOSE, POINT OF CARE 102  70 - 105 (mg/dL)   CBC/DIFF   Component Value Range   . WBC 18.3 (*) 3.5 - 11.0 (THOU/uL)   . RBC 3.98  3.84 - 5.04 (MIL/uL)   . HGB 11.8  11.5 - 15.2 (g/dL)   . HCT 34.4  33.5 - 45.2 (%)   . MCV 86.4  82.0 - 99.0 (fL)   . MCH 29.6  27.4 - 33.0 (pg)   . MCHC 34.3  31.6 - 35.5 (g/dL)   . RDW 13.6  10.2 - 14.0 (%)   . PLATELET COUNT 122 (*) 140 - 450 (THOU/uL)   . MPV 7.4  7.4 - 10.4 (FL)   . PMN'S 87 (*) 40 - 75 (%)   . PMN ABS 15.921 (*) 1.5 - 7.7 (THOU/uL)   . LYMPHOCYTES 9 (*) 20 - 45 (%)   . LYMPHS ABS 1.647  1.0 - 4.8 (THOU/uL)    . MONOCYTES 4  4 - 13 (%)   . MONOS ABS 0.732  0.1 - 0.9 (THOU/uL)   . EOSINOPHIL 0 (*) 1 - 6 (%)   . EOS ABS 0.000 (*) 0.1 - 0.3 (THOU/uL)   . BASOPHILS 0  0 - 1 (%)   . BASOS ABS 0.000  0.0 - 0.2 (THOU/uL)   . RBC MORPHOLOGY OVALOCYTES-1+     . WBC  MORPHOLOGY TOXIC GRANULATION-1+     POCT WHOLE BLOOD GLUCOSE   Component Value Range   . GLUCOSE, POINT OF CARE 95  70 - 105 (mg/dL)   POCT WHOLE BLOOD GLUCOSE   Component Value Range   . GLUCOSE, POINT OF CARE 93  70 - 105 (mg/dL)   URINALYSIS W/CULTURE (INPT ROUTINE)   Component Value Range   . CHARACTER CLOUDY     . COLOR YELLOW     . SPECIFIC GRAVITY, URINE 1.010  1.005 - 1.030    . GLUCOSE 50 (*) NEGATIVE (mg/dL)   . BILIRUBIN NEGATIVE  NEGATIVE    . KETONES NEGATIVE  NEGATIVE (mg/dL)   . BLOOD LARGE (*) NEGATIVE    . PH URINE 9.0 (*) 5.0 - 8.0    . PROTEIN 100 (*) NEGATIVE (mg/dL)   . UROBILINOGEN NORMAL  0.2 (mg/dL)   . NITRITE NEGATIVE  NEGATIVE    . LEUKOCYTES LARGE (*) NEGATIVE    MICROSCOPIC URINALYSIS   Component Value Range   . RBC'S   (*) 0 - 4 (/HPF)    Value: >182  MICROSCOPIC ANALYSIS PERFORMED BY AUTOMATED METHOD Results      reviewed.   . WBC'S 25 (*) 0 - 6 (/HPF)   . BACTERIA OCCASIONAL OR LESS  OCL^OCCASIONAL OR LESS    . Mucous LIGHT  (/LPF)   . Hyaline Cast 16 (*) 0 - 3 (/LPF)   POCT WHOLE BLOOD GLUCOSE   Component Value Range   . GLUCOSE, POINT OF CARE 109 (*) 70 - 105 (mg/dL)   POCT WHOLE BLOOD GLUCOSE   Component Value Range   . GLUCOSE, POINT OF CARE 138 (*) 70 - 105 (mg/dL)   POCT WHOLE BLOOD GLUCOSE   Component Value Range   . GLUCOSE, POINT OF CARE 136 (*) 70 - 105 (mg/dL)         Imaging:  N/A    Current Medications:    Current facility-administered medications:  insulin regular human (HUMULIN R) 250 Units in NS 250 mL infusion 1 Units/hr Intravenous Continuous   insulin R human (HUMULIN R) 100 units/mL injection  4-10 Units Intravenous Q1H PRN   dextrose 50% (0.5 g/mL) injection  12.5 g Intravenous Q1H PRN    NS premix infusion   Intravenous Continuous   insulin glargine (LANTUS) 100 units/mL injection  30 Units Subcutaneous NIGHTLY   docusate sodium (COLACE) 10mg  per mL oral liquid 100 mg Gastric (NG, OG, PEG, GT) 2x/day   citalopram (CELEXA) tablet  40 mg Gastric (NG, OG, PEG, GT) Daily   esomeprazole (NEXIUM) capsule  40 mg Gastric (NG, OG, PEG, GT) QAM   acetaminophen (TYLENOL) tablet  650 mg Gastric (NG, OG, PEG, GT) Q6H PRN   prenatal vitamin-iron fumarate-folic acid  (NATALCARE PLUS) 27-0.8mg  per tablet  1 Tab Gastric (NG, OG, PEG, GT) Daily   sennosides (SENOKOT) 8.8mg  per 5mL oral liquid  5 mL Gastric (NG, OG, PEG, GT) 2x/day   simvastatin (ZOCOR) tablet  40 mg Gastric (NG, OG, PEG, GT) QPM   D5W NS premix infusion   Intravenous Continuous   sodium phosphate 15 mmol in D5W 100 mL IVPB 15 mmol Intravenous Once   cefTRIAXone (ROCEPHIN) 1 g in NS 50 mL IVPB 1 g Intravenous Q24H   HYDROmorphone (DILAUDID) 1 mg/mL (tot vol 100 mL) in NS PCA  1 Bag Intravenous Q1H PRN   SSIP insulin R human (HUMULIN R) 100 units/mL injection  4-12 Units Subcutaneous 4x/day PRN   ondansetron (  ZOFRAN) 2 mg/mL injection  4 mg Intravenous Q6H PRN   NS 10 mL injection  2 mL Intracatheter Q8HRS   NS 10 mL injection  2-6 mL Intracatheter Q1 MIN PRN   HYDROmorphone (DILAUDID) 1 mg/mL injection  0.4 mg Intravenous Q2H PRN   labetalol (TRANDATE) 5 mg/mL injection  5 mg Intravenous Q2H PRN   hydrALAZINE (APRESOLINE) injection 10 mg 10 mg Intravenous Q4H PRN         Physical Exam:  General: Laying on the bed comfortably with oxygen by NC, NG tube in place and right sided ileo conduit diversion.   Eyes: Conjunctiva clear.   HENT:Head atraumatic and normocephalic, right sided central line is in place.   Neck: No JVD or thyromegaly or lymphadenopathy   Lungs: Clear to auscultation bilaterally. , with basal crepitation.   Cardiovascular: regular rate and rhythm    Abdomen: Soft, non-tender, Bowel sounds normal, right sided ileo conduit diversion, midline post surgical dressing above the scar.   Extremities: No cyanosis or edema   Skin: Skin warm and dry   Neurologic: Grossly normal   Lymphatics: No lymphadenopathy   Psychiatric: Normal    Impression/Recommendations:      This is a 70 y/o female with PMH significant for TCC of the left renal pelvis, s/p left nephroureterectomy four years ago, now admitted to hospital with bladder mass s/p Radical cystectomy with creation of anileoconduit diversion on 12/31/10, now found to have deteriorating kidney ficunctions.   - She has only right kidney for last four years after nephrectomy of right kidney secondary to TCC. His elevated creatinine seems secondary to ischemic ATN after long surgery. New hydronephrosis could a contributory factor. His creatinine is betting worse probably running normal course of ischemic ATN. Her urine out put is improving. Clinically she is feeling better.    - Follow electrolytes, BUN and creatinine daily. .   - Change IVF back to normal saline from D5W with 150 meq of sodium bicarbonate.   - Repeat renal USG to follow up hydronephrosis.  - Monitor blood pressure, keep it within normal limits.   - Will follow.        Jeanice Lim, MD 01/02/2011, 3:58 PM      I saw and examined the patient.  I reviewed the resident's note.  I agree with the findings and plan of care as documented in the resident's note.  Any exceptions/additions are edited/noted.    Early Osmond, MD 01/02/2011, 9:40 PM

## 2011-01-02 NOTE — Care Management Notes (Signed)
 Care Coordinator/Social Work Plan  [place]  Patient Name: Christy Hale MRN: [id number] Acct Number: [id number]  DOB: 12-04-1940 Age: 70  **Admission Information**  Patient Type: INPATIENT  Admit Date: 12/31/2010 Admit Time: 04:52  Admit Reason: BLADDER CANCER  Admitting Phys: ARMAND RODERIC ORN  Attending Phys: ARMAND RODERIC ORN  Unit: SICU Bed: SICU-09  160. CMS Important Message / Detailed Notice  Created by : Dagoberto Sneddon Date/Time 2011-01-02 11:24:47.050  Medicare Important Message/Detailed Notice  Admission- CMS-Important Message from Medicare About Your Rights (CMS-1405) IMM was delivered to the patient's  representative. A copy was provided. (Date / Time) 01/02/2011

## 2011-01-02 NOTE — Care Management Notes (Signed)
Care Coordinator/Social Work Plan  [place]  Patient Name: Christy Hale MRN: [id number] Acct Number: [id number]  DOB: May 25, 1941 Age: 70  **Admission Information**  Patient Type: INPATIENT  Admit Date: 12/31/2010 Admit Time: 04:52  Admit Reason: BLADDER CANCER  Admitting Phys: Caryl Ada  Attending Phys: Caryl Ada  Unit: SICU Bed: SICU-09  130. CM Assessment Notes  Created by : Judeth Cornfield Date/Time 2011-01-02 18:32:50.143  Assessment Notes  Note:  Note: Hd # 2. clinical: Patient tolerated extubation without difficulty per notes. Continues with aggressive fluid  resuscitation IV. Maintain NG tube to gravity. Insulin gtt continues. PCA IV Dilaudid continues to manage pain control.   Met with patient at bedside this a.m. Patient alert and oriented x 3. Patient states that she lives with her husband in  [place] in [place]. Patient states her husband is having heart sx at [place] in [place] and he plans to have rehab there  until well enough to come home. Patient states that Colbert Ewing is her friend and could provide transportation if needed.  Patient has insurance with RX coverage. Utilizes [facility] pharm for meds. Has a homemakers service in home that  comes in from [employer]-provides custodial care in home and prepares meals for her and her husband. States that  this aid also helps with showers sometimes. DME-has w/c and FWW. PCP-Dr. Clovis Riley. Discussed need for possible  rehab/SNF/HH at discharge when stable-explained still early but concern that patient will not have support in home.  Patient states that her children are not around and doesn't know what she would do if she needed help in home.   Discharge plan: PT/OT will evaluate. Continues with PCA pump and insulin pump. Patient is not medically stable for  discharge plan/needs.   Consult and Reassessment Will continue to re-assess for changes in plan of care or discharge needs

## 2011-01-02 NOTE — Progress Notes (Addendum)
[facility]                                                  UROLOGY  PROGRESS NOTE    Christy Hale, Christy Hale, 70 y.o. female  Date of Admission:  12/31/2010  Date of Birth:  August 11, 1941  Date of Service:  01/02/2011    Post Op Day: 2 Days Post-Op S/P Procedure(s) (LRB):  CYSTECTOMY RADICAL (N/A)  PLACEMENT CONDUIT URINARY (N/A)    Subjective: No acute urologic events, remains in SICU, extubated yesterday    Vital Signs:  Temp (24hrs) Max:38.7 C (101.7 F)      Temperature: 36.8 C (98.2 F) (01/02/11 0400)  BP (Non-Invasive): 147/62 mmHg (01/02/11 0500)  MAP (Non-Invasive): 98 mmHG (01/02/11 0500)  Heart Rate: 80  (01/02/11 0500)  Respiratory Rate: 16  (01/02/11 0500)  Pain Score: 0 (01/01/11 0400)  SpO2-1: 99 % (01/02/11 0500)    Current Medications:    Current facility-administered medications:  insulin regular human (HUMULIN R) 250 Units in NS 250 mL infusion 1 Units/hr Intravenous Continuous   insulin R human (HUMULIN R) 100 units/mL injection  4-10 Units Intravenous Q1H PRN   dextrose 50% (0.5 g/mL) injection  12.5 g Intravenous Q1H PRN   sodium phosphate 15 mmol in D5W 100 mL IVPB 15 mmol Intravenous Once   prenatal vitamin-iron fumarate-folic acid  (NATALCARE PLUS) 27-0.8mg  per tablet  1 Tab Oral Daily   potassium chloride 20 mEq in SW 50 mL premix infusion  20 mEq Intravenous Once   insulin glargine (LANTUS) 100 units/mL injection  10 Units Subcutaneous 2x/day   D5W 1,000 mL with sodium bicarbonate 150 mEq infusion  Intravenous Continuous   cefTRIAXone (ROCEPHIN) 1 g in NS 50 mL IVPB 1 g Intravenous Q24H   HYDROmorphone (DILAUDID) 1 mg/mL (tot vol 100 mL) in NS PCA  1 Bag Intravenous Q1H PRN   diphenhydrAMINE (BENADRYL) 50 mg/mL injection  6.5 mg Intravenous Q6H PRN   citalopram (CELEXA) tablet  40 mg Oral Daily   SSIP insulin R human (HUMULIN R) 100 units/mL injection  4-12 Units Subcutaneous 4x/day PRN    acetaminophen (TYLENOL) tablet  650 mg Oral Q6H PRN   ondansetron (ZOFRAN) 2 mg/mL injection  4 mg Intravenous Q6H PRN   NS 10 mL injection  2 mL Intracatheter Q8HRS   NS 10 mL injection  2-6 mL Intracatheter Q1 MIN PRN   simvastatin (ZOCOR) tablet  40 mg Oral QPM   esomeprazole (NEXIUM) capsule  40 mg Oral QAM   HYDROmorphone (DILAUDID) 1 mg/mL injection  0.4 mg Intravenous Q2H PRN   labetalol (TRANDATE) 5 mg/mL injection  5 mg Intravenous Q2H PRN   hydrALAZINE (APRESOLINE) injection 10 mg 10 mg Intravenous Q4H PRN   sennosides (SENOKOT) 8.8mg  per 5mL oral liquid  5 mL Oral 2x/day   docusate sodium (COLACE) 10mg  per mL oral liquid  50 mg Oral 2x/day         Today's Physical Exam:  Constitutional - NAD  Eyes - Clear  Neuro - alert  CV - RR  Pulm - CTA B/L  GI - Soft, stoma is pink/viable, incision c/d/i  Skin - Warm and Dry  GU - pelvic drain in place with serosanguinous drainage      I/O:   Intake/Output Summary (Last 24 hours) at 01/02/11 0720  Last data  filed at 01/02/11 0700   Gross per 24 hour   Intake 2954.45 ml   Output   4000 ml   Net -1045.55 ml       I/O current shift:  03/15 0000 - 03/15 0759  In: 454 [I.V.:454]  Out: 775 [Urine:15; Drains:560; NG/OG/GT:200]    Prophylaxis:  Date Started Date Completed   DVT/PE SCDs/ Venodynes     GI         Nutrition/Residuals:  NPO       Labs  Please indicate ordered or reviewed)  Reviewed: BMP:     Recent Labs   [facility] 01/02/11 0554   . SODIUM 137   . POTASSIUM 4.2   . CHLORIDE 108   . CO2 20*   . BUN 32*   . CREATININE 3.10*   . GLUCOSENF 238*   . GLUCOSEFAST --   . ANIONGAP 9   . BUNCRRATIO 10   . GFR 15*   . CALCIUM 8.7       CBC Results Differential Results   Recent Labs   [facility] 01/01/11 2313   . WBC 18.4*   . HGB 11.4*   . HCT 33.4*   . PLTCNT 100*      Recent Results (from the past 30 hour(s))   CBC/DIFF    Collection Time    01/01/11 11:13 PM   Component Value Comment   . WBC 18.4 Test(s) repeated and results duplicated.   Marland Kitchen PMN'S 84    . LYMPHOCYTES 8     . MONOCYTES 8    . EOSINOPHIL 0    . BASOPHILS 0    . NRBC'S 0             Assessment/ Plan:   Active Hospital Problems   (*Primary Problem)   Diagnoses   . *Bladder cancer   . Metabolic acidosis   . Hypomagnesemia   . Hx of total cystectomy   . Hx of unilateral nephrectomy   . Respiratory failure following trauma and surgery   . AKI (acute kidney injury)   . Type 2 diabetes mellitus     dx 20 + yrs ago, does not check fs at home     . Hyperlipidemia       POD 2 Open radical cystectomy with closure of vaginal cuff and urethrectomy    Remains in SICU  Stoma nursing to see patient  Will re-send Foley drain for Cr  Recc Aggressive fluid resuscitation  Maintain NG tube to gravity  Appreciate SICU assistance  Appreciate Nephrology assistance  Recc patient in chair position    [place] P Hubsher, MD 01/02/2011, 7:20 AM    I saw and examined the patient.  I reviewed the resident's note.  I agree with the findings and plan of care as documented in the resident's note.  Any exceptions/additions are edited/noted.    Caryl Ada, MD 01/02/2011, 4:02 PM

## 2011-01-02 NOTE — Procedures (Addendum)
[facility]  Bedside Cystoscopy    Procedure Date:  01/02/2011 Time:  1300  Procedure: Bedside looposcopy with ureteral stent exchange.   Diagnosis:  1. S/p radical cystectomy with creation of an ileoconduit diversion, 2. Elevated creatinine, 3. History of TCC of the left renal pelvis, status post left nephroureterectomy  Indication:  70 yo female  S/p radical cystectomy with creation of an ileoconduit diversion, 2. Elevated creatinine, 3. Indwelling right ureteral stent 4. History of TCC of the left renal pelvis, status post left nephroureterectomy    Procedure Details:  The risks, benefits, complications, treatment options, and expected outcomes were discussed with the patient. The patient concurred with the proposed plan, giving informed consent.    Looposcopy was performed today under local anesthesia, using sterile technique. The patient was placed in the supine position, prepped with Betadine, and draped in the usual sterile fashion. A 17 fr. sheath flexiblecystoscope was used to inspect the stoma and ileal conduit. Semi-rigid graspers were advanced through the scope and grabbed the distal coil of the stent and brought it extracorporally. A ureteral diversion wire was advanced through the stent up to renal pelvis and the stent was removed. A ureteral diversion stent was then advanced over the wire into the renal pelvis and the wire was removed. Urine efflux from the stent was noted.       Findings:  1. Previous placed JJ stent removed intact  2. Adequeate placement of ureteral diversion stent           Specimens: none                 Complications:  None; patient tolerated the procedure well.             Condition: stable    Dr. Marzetta Board was present and participated in the entire procedure.    [place] P Hubsher, MD 01/02/2011, 1:40 PM  I was present and supervised/observed the entire procedure.    Caryl Ada, MD 01/02/2011, 4:01 PM

## 2011-01-02 NOTE — CDI WORKSHEET (Addendum)
DRG NLV   Working DRG 1: 653 Major bladder procedures w [facility]  4/4    Final MS-DRG:  653 Major bladder procedures w [facility]     Reason Code: 1-Final/Concurrent DRG Match - Optimal Selection    Comments:     Final Coder:  Brett Fairy       Principle Diagnosis:  1. High Grade bladder cancer.  = urothelia ca bladder  2. History of TCC of the left renal pelvis, status post left nephroureterectomy.        Principle Procedure:  ) Open radical cystectomy with closure of vaginal cuff and urethrectomy   2) Creation of Ileal conduit diversion   3) Bilateral pelvic lymphadenectomy   4) Appendectomy   5) Exploratory laparotomy   6) Retrograde insertion of 6 X 20 cm double J ureteral stent into right ureter Procedure Date:  3/13   Secondary Dx:  Solitary kidney s/p nephrectomy  htn  Ulcer dz -chronic  Hx left lung cancer  Arthritis  dm2 - insulin  hpl  gerd  Hx smoking  Met acidosis  Hypo mag  Anuric   hypovolemic post op  aki - due to hypotension with OR /ischemic atn  Anemia - dilutional  resp failure s/p sx  Hypo k  hydronephrosis   Secondary Procedures:  3/13 - vent x24 hours     Past Medical Hx:     Comments:     Home Medications:

## 2011-01-03 ENCOUNTER — Inpatient Hospital Stay (HOSPITAL_COMMUNITY): Payer: Medicare Other

## 2011-01-03 DIAGNOSIS — J9819 Other pulmonary collapse: Secondary | ICD-10-CM

## 2011-01-03 LAB — ARTERIAL BLOOD GAS/LACTATE/CO-OX/LYTES (NA/K/CA/CL/GLUC): SODIUM: 137 mmol/L (ref 136–145)

## 2011-01-03 LAB — ARTERIAL BLOOD GAS/LACTATE/CO-OX/LYTES (NA/K/CA/CL/GLUC) - ORS ONLY
%FIO2: 28 % (ref 21–100)
%FIO2: 28 % (ref 21–100)
BASE DEFICIT: 3.6 mmol/L — ABNORMAL HIGH (ref 0.0–3.0)
BASE DEFICIT: 3.3 mmol/L — ABNORMAL HIGH (ref 0.0–3.0)
BICARBONATE: 22.1 mmol/L (ref 18.0–29.0)
BICARBONATE: 22.3 mmol/L (ref 18.0–29.0)
CARBOXYHEMOGLOBIN: 0.6 % (ref 0.0–2.5)
CARBOXYHEMOGLOBIN: 1.1 % (ref 0.0–2.5)
CHLORIDE: 108 mmol/L (ref 96–111)
CHLORIDE: 109 mmol/L (ref 96–111)
GLUCOSE: 124 mg/dL — ABNORMAL HIGH (ref 70–105)
GLUCOSE: 134 mg/dL — ABNORMAL HIGH (ref 70–105)
HEMATOCRIT: 32 % — ABNORMAL LOW (ref 33.5–45.2)
HEMATOCRIT: 31 % — ABNORMAL LOW (ref 33.5–45.2)
HEMOGLOBIN: 10.8 g/dL — ABNORMAL LOW (ref 12.0–16.0)
HEMOGLOBIN: 10.4 g/dL — ABNORMAL LOW (ref 12.0–16.0)
IONIZED CALCIUM: 1.14 mmol/L — ABNORMAL LOW (ref 1.30–1.46)
IONIZED CALCIUM: 1.17 mmol/L — ABNORMAL LOW (ref 1.30–1.46)
LACTATE: 0.6 mmol/L (ref <1.3)
LACTATE: 0.7 mmol/L (ref <1.3)
MET-HEMOGLOBIN: 0.8 % (ref 0.0–3.0)
MET-HEMOGLOBIN: 0.9 % (ref 0.0–3.0)
O2CT: 14.7 — ABNORMAL LOW (ref 15.7–21.6)
O2CT: 14.2 — ABNORMAL LOW (ref 15.7–21.6)
OXYHEMOGLOBIN: 95.9 % (ref 85.0–98.0)
OXYHEMOGLOBIN: 95.9 % (ref 85.0–98.0)
PCO2: 38 mmHg (ref 33.1–43.1)
PCO2: 40 mmHg (ref 33.1–43.1)
PH: 7.36 (ref 7.350–7.450)
PH: 7.35 (ref 7.350–7.450)
PIO2/FIO2 RATIO: 321 (ref >300)
PIO2/FIO2 RATIO: 375 (ref >300)
PO2: 90 mmHg (ref 72–100)
PO2: 105 mmHg — ABNORMAL HIGH (ref 72–100)
SODIUM: 137 mmol/L (ref 136–145)
WHOLE BLOOD K+: 3.8 mmol/L (ref 3.5–5.0)
WHOLE BLOOD K+: 4 mmol/L (ref 3.5–5.0)

## 2011-01-03 LAB — CBC/DIFF
BASOPHILS: 0 % (ref 0–1)
BASOS ABS: 0.015 THOU/uL (ref 0.0–0.2)
EOS ABS: 0.143 THOU/uL (ref 0.1–0.3)
EOSINOPHIL: 1 % (ref 1–6)
HCT: 32.2 % — ABNORMAL LOW (ref 33.5–45.2)
HGB: 10.8 g/dL — ABNORMAL LOW (ref 11.5–15.2)
LYMPHOCYTES: 13 % — ABNORMAL LOW (ref 20–45)
LYMPHS ABS: 2.13 THOU/uL (ref 1.0–4.8)
MCH: 29.4 pg (ref 27.4–33.0)
MCHC: 33.6 g/dL (ref 31.6–35.5)
MCV: 87.6 fL (ref 82.0–99.0)
MONOCYTES: 8 % (ref 4–13)
MONOS ABS: 1.34 THOU/uL — ABNORMAL HIGH (ref 0.1–0.9)
MPV: 7.7 FL (ref 7.4–10.4)
NRBC'S: 0 /100{WBCs}
PLATELET COUNT: 123 10*3/uL — ABNORMAL LOW (ref 140–450)
PMN ABS: 13.4 THOU/uL — ABNORMAL HIGH (ref 1.5–7.7)
PMN'S: 78 % — ABNORMAL HIGH (ref 40–75)
RBC: 3.68 MIL/uL — ABNORMAL LOW (ref 3.84–5.04)
RDW: 13.6 % (ref 10.2–14.0)
WBC: 17 THOU/uL — ABNORMAL HIGH (ref 3.5–11.0)

## 2011-01-03 LAB — BASIC METABOLIC PANEL
ANION GAP: 9 mmol/L (ref 5–16)
BUN/CREAT RATIO: 13 (ref 6–22)
BUN: 26 mg/dL — ABNORMAL HIGH (ref 6–20)
CALCIUM: 7.7 mg/dL — ABNORMAL LOW (ref 8.5–10.4)
CARBON DIOXIDE: 23 mmol/L (ref 22–32)
CHLORIDE: 107 mmol/L (ref 96–111)
CREATININE: 1.85 mg/dL — ABNORMAL HIGH (ref 0.49–1.10)
ESTIMATED GLOMERULAR FILTRATION RATE: 27 ml/min/1.73m2 — ABNORMAL LOW (ref >59)
GLUCOSE,NONFAST: 118 mg/dL (ref 65–139)
POTASSIUM: 4.7 mmol/L (ref 3.5–5.1)
SODIUM: 139 mmol/L (ref 136–145)

## 2011-01-03 LAB — PHOSPHORUS: PHOSPHORUS: 3.3 mg/dL (ref 2.4–4.7)

## 2011-01-03 LAB — PERFORM POC WHOLE BLOOD GLUCOSE
GLUCOSE, POINT OF CARE: 118 mg/dL — ABNORMAL HIGH (ref 70–105)
GLUCOSE, POINT OF CARE: 119 mg/dL — ABNORMAL HIGH (ref 70–105)
GLUCOSE, POINT OF CARE: 120 mg/dL — ABNORMAL HIGH (ref 70–105)
GLUCOSE, POINT OF CARE: 138 mg/dL — ABNORMAL HIGH (ref 70–105)
GLUCOSE, POINT OF CARE: 140 mg/dL — ABNORMAL HIGH (ref 70–105)
GLUCOSE, POINT OF CARE: 152 mg/dL — ABNORMAL HIGH (ref 70–105)
GLUCOSE, POINT OF CARE: 165 mg/dL — ABNORMAL HIGH (ref 70–105)

## 2011-01-03 LAB — MAGNESIUM: MAGNESIUM: 2 mg/dL (ref 1.7–2.5)

## 2011-01-03 MED ORDER — CALCIUM GLUCONATE 100 MG/ML (10 %) INTRAVENOUS SOLUTION
1000.0000 mg | Freq: Once | INTRAVENOUS | Status: AC
Start: 2011-01-03 — End: 2011-01-03
  Administered 2011-01-03: 1000 mg via INTRAVENOUS
  Filled 2011-01-03: qty 10

## 2011-01-03 MED ORDER — ALBUMIN, HUMAN 5 % INTRAVENOUS SOLUTION
12.5000 g | Freq: Once | INTRAVENOUS | Status: AC
Start: 2011-01-03 — End: 2011-01-03
  Administered 2011-01-03: 12.5 g via INTRAVENOUS

## 2011-01-03 MED ORDER — DOCUSATE SODIUM 100 MG CAPSULE
100.00 mg | ORAL_CAPSULE | Freq: Two times a day (BID) | ORAL | Status: DC
Start: 2011-01-03 — End: 2011-01-07
  Administered 2011-01-03 – 2011-01-07 (×9): 100 mg via ORAL
  Filled 2011-01-03 (×10): qty 1

## 2011-01-03 MED ORDER — DEXTROSE 5 % AND 0.45 % SODIUM CHLORIDE INTRAVENOUS SOLUTION
INTRAVENOUS | Status: DC
Start: 2011-01-03 — End: 2011-01-05
  Administered 2011-01-04: 0 via INTRAVENOUS

## 2011-01-03 MED ORDER — SENNOSIDES 8.6 MG-DOCUSATE SODIUM 50 MG TABLET
1.00 | ORAL_TABLET | Freq: Two times a day (BID) | ORAL | Status: DC
Start: 2011-01-03 — End: 2011-01-07
  Administered 2011-01-03 – 2011-01-07 (×9): 1 via ORAL
  Filled 2011-01-03 (×10): qty 1

## 2011-01-03 MED ORDER — HEPARIN (PORCINE) 5,000 UNIT/ML INJECTION SOLUTION
5000.0000 [IU] | Freq: Three times a day (TID) | INTRAMUSCULAR | Status: DC
Start: 2011-01-03 — End: 2011-01-07
  Administered 2011-01-03 – 2011-01-07 (×12): 5000 [IU] via SUBCUTANEOUS
  Filled 2011-01-03 (×15): qty 1

## 2011-01-03 MED ORDER — VERAPAMIL ER (SR) 240 MG TABLET,EXTENDED RELEASE
240.00 mg | ORAL_TABLET | Freq: Every morning | ORAL | Status: DC
Start: 2011-01-03 — End: 2011-01-07
  Administered 2011-01-03 – 2011-01-07 (×5): 240 mg via ORAL
  Filled 2011-01-03 (×6): qty 1

## 2011-01-03 MED ORDER — HYDROCODONE 5 MG-ACETAMINOPHEN 325 MG TABLET
1.00 | ORAL_TABLET | ORAL | Status: DC | PRN
Start: 2011-01-03 — End: 2011-01-07
  Administered 2011-01-03 – 2011-01-07 (×13): 1 via ORAL
  Filled 2011-01-03 (×13): qty 1

## 2011-01-03 NOTE — OT Evaluation (Signed)
Occupational Therapy Initial Evaluation    HPI:   Christy Hale is a 70 y.o. female admitted PMH significant for TCC of the left renal pelvis, s/p left nephroureterectomy four years ago, now admitted to hospital with bladder mass s/p Radical cystectomy with creation of an ileoconduit diversion on 12/31/10, now found to have deteriorating kidney functions. S/p Open radical cystectomy with closure of vaginal cuff and urethrectomy.     Precautions: limited trunk ROM due to surgery site  Past Medical History   Diagnosis Date   . Hypertension    . Ulcer disease    . Anemia    . Lung cancer      left lung, unknown type   . Arthritis    . Diabetes mellitus    . Hyperlipidemia    . History of transfusion      no reaction   . GERD (gastroesophageal reflux disease)      hx of, no longer takes meds   . Kidney disease      s/p nephrectomy due to cancer   . Type 2 diabetes mellitus      dx 20 + yrs ago, does not check fs at home   . cancer      s/p renal cancer, lung cancer, skin cancer, and now with bladder cancer         Past Surgical History   Procedure Date   . Hx hysterectomy    . Hx hand surgery      bilateral   . Hx foot surgery      right   . Hx other      left arm surgery   . Hx nephrectomy      with ureterectomy (left)   . Hx other      left lung cancer removal           SUBJECTIVE:     Functional Status Prior to Admission:  Reports independence at home. Cared for husband who "doesn't tolerate pain well." Unsure of DME at home. Patient pleasant and cooperative.     ROM  Left Upper Extremity: WFL  Right Upper Extremity: WFL    Upper Extremity Motor/Strength  Fine Motor Control: WFL  Gross Motor Control: WFL  Ataxia: no  Hand Dominance: right   Endurance: Fair    Tone: Normal            RIGHT LEFT   Shoulder flexion WFL WFL    abduction [facility] WFL   Elbow flexion [facility] WFL    extension WFL WFL    pronation WFL WFL    supination WFL [facility]   Wrist  WFL WFL    flexion WFL WFL    extension [facility] WFL   Digits  WFL WFL    Grips  WFL WFL     Cognition  Cognition: alert and cooperative  Orientation: person, place, time and situation  Attention: WNL  Follows Commands: simple and complex  Short Term Memory: WNL  Long Term Memory: WNL  Problem Solving: WNL  Follow Through: WNL  Initiation: WNL  Safety: WNL  Perception  R/L Discrimination: WNL  Body Scheme: WNL  Vision  Visual Tracking: fluent  Vision Neglect: None  Hemianopsia: None      Activities of Daily Living  Eating: set up assist     Grooming: set up assist     Bath: Upper Extremity - to be assessed           Lower Extremity - to  be assessed  Dressing: Upper Extremity - set up assist                    Lower Extremity - Patient doffed socks with contact guard assist, very slow with difficulty due to pain. Dependent to donn socks.   Toileting: dependent - 2 catheters  Weight Bearing Restrictions: none  Sitting Balance: Static WFL                            Dynamic 3 (Fair)  Standing Balance: see PT note  Functional Transfers: see PT note for description. Observed patient ambulating in hallway with PT with close contact guard assist. No DME.   Supine to Sit: to be assessed  Posture: midline      Rehabilitation Potential  Good    Problems  ADL's, IADL's, mobility, strength, endurance and pain limits function      Comments: Patient doing well. In good spirits. Hopes to go to rehab at a nursing home. Has hopes of recovery. Patient limited at this time with lower body ADLs due to abdominal pain, which causes limited flexion due to surgery site. Patient experiencing a lot of pain with socks. Patient declined mobility and standing due to just getting back from walk with physical therapy. See PT note for OOB assessment. Feel patient will progress well, but at this time is not appropriate for return home independently. Patient appears fatigued with limited activity tolerance.     Patient Goals: rehab    Pain Assessment:   7-10/10    Patient/Family Goals     Within 4  week(s) the patient will perform functional mobility/transfers within ADL tasks independently.     Within 4  week(s) the patient will perform LB ADLs with mod I/I.     Within 4  week(s) the patient will perform supine to sit I'ly for EOB/OOB ADLs.     Within 4  week(s) the patient will demo good sitting/standing balance within functional ADL/IADL activity.     PLAN   ADL retraining, IADL retraining, functional mobility/transfer training, energy conservation/endurance training, upper extremity functional  activity, therapeutic exercise, home safety training, patient/family education and further goals assessed with patient progress      To provide occupational therapy services 1 times/day for 3-5 days/week for 4 weeks.    D/C Recommendations/Needs:   Equipment - none  Other Treatment Recommendations -  Short Term SNF or Skilled Nursing Facility  Evaluation Time:   40 minutes  Jaquelyn Bitter, OTR/L  Therapist beeper #  [phone]      Patient/caregiver was informed of the results/recommendations and potential risks/benefits of treatment.    Prognosis and duration of therapy are dependent on many factors such as motivation, learning capacity, physiologic status, and follow through of therapy.

## 2011-01-03 NOTE — Progress Notes (Addendum)
[facility]                                               SICU PROGRESS NOTE    Christy Hale, Christy Hale  Date of Admission:  12/31/2010  Date of Service: 01/03/2011  Date of Birth:  1941-08-06    Primary Attending: Marzetta Board   Primary Service: Urology    Post Op Day:3 Days Post-Op S/P Procedure(s) (LRB):  CYSTECTOMY RADICAL (N/A)  PLACEMENT CONDUIT URINARY (N/A)  Subjective: Patient reports that she is feeling better today. Pain controlled. No chest pain, abd pain, SOB, n/v. Bedside ureteral stent change by Urology yesterday with improved UOP.     Vital Signs:  Temp (24hrs) Max:37 C (98.6 F)      Systolic (24hrs), Avg:135 mmHg, Min:119 mmHg, Max:162 mmHg    Diastolic (24hrs), Avg:52 mmHg, Min:44 mmHg, Max:59 mmHg    Temp  Avg: 36.8 C (98.2 F)  Min: 36.6 C (97.9 F)  Max: 37 C (98.6 F)  Pulse  Avg: 73.1   Min: 68   Max: 85   Resp  Avg: 18.6   Min: 11   Max: 28   SpO2  Avg: 98.1 %  Min: 95 %  Max: 100 %  MAP (Non-Invasive)  Avg: 74.5 mmHG  Min: 60 mmHG  Max: 94 mmHG  Pain Score: 0    Hemodynamics:  CVP: 8 MM HG  ART-Line  MAP: 100 mmHg      Blood Gas Results:  Recent Labs   Basename 01/03/11 0057 01/02/11 1638 01/02/11 0708   . SPECIMENTYPE ARTERIAL ARTERIAL ARTERIAL   . FI02 28 28 28    . PH 7.360 7.380 7.400   . PCO2 38.0 38.0 32.0*   . PO2 90 99 114*   . BICARBONATE 22.1 23.1 21.6   . BASEEXCESS Test Not Performed Test Not Performed Test Not Performed   . BASEDEFICIT 3.6* 2.3 4.2*   . PFRATIO 321 354 407         Current Medications:    Current facility-administered medications:  insulin regular human (HUMULIN R) 250 Units in NS 250 mL infusion 1 Units/hr Intravenous Continuous   insulin R human (HUMULIN R) 100 units/mL injection  4-10 Units Intravenous Q1H PRN   dextrose 50% (0.5 g/mL) injection  12.5 g Intravenous Q1H PRN   insulin glargine (LANTUS) 100 units/mL injection  30 Units Subcutaneous NIGHTLY    docusate sodium (COLACE) 10mg  per mL oral liquid 100 mg Gastric (NG, OG, PEG, GT) 2x/day   citalopram (CELEXA) tablet  40 mg Gastric (NG, OG, PEG, GT) Daily   esomeprazole (NEXIUM) capsule  40 mg Gastric (NG, OG, PEG, GT) QAM   acetaminophen (TYLENOL) tablet  650 mg Gastric (NG, OG, PEG, GT) Q6H PRN   prenatal vitamin-iron fumarate-folic acid  (NATALCARE PLUS) 27-0.8mg  per tablet  1 Tab Gastric (NG, OG, PEG, GT) Daily   sennosides (SENOKOT) 8.8mg  per 5mL oral liquid  5 mL Gastric (NG, OG, PEG, GT) 2x/day   simvastatin (ZOCOR) tablet  40 mg Gastric (NG, OG, PEG, GT) QPM   NS premix infusion   Intravenous Continuous   sodium phosphate 15 mmol in D5W 100 mL IVPB 15 mmol Intravenous Once   cefTRIAXone (ROCEPHIN) 1 g in NS 50 mL IVPB 1 g Intravenous Q24H   HYDROmorphone (DILAUDID) 1 mg/mL (tot vol 100 mL) in NS  PCA  1 Bag Intravenous Q1H PRN   SSIP insulin R human (HUMULIN R) 100 units/mL injection  4-12 Units Subcutaneous 4x/day PRN   ondansetron (ZOFRAN) 2 mg/mL injection  4 mg Intravenous Q6H PRN   NS 10 mL injection  2 mL Intracatheter Q8HRS   NS 10 mL injection  2-6 mL Intracatheter Q1 MIN PRN   labetalol (TRANDATE) 5 mg/mL injection  5 mg Intravenous Q2H PRN   hydrALAZINE (APRESOLINE) injection 10 mg 10 mg Intravenous Q4H PRN         Today's Physical Exam:  Vitals reviewed  General: no distress  Eyes: Conjunctiva clear., Pupils equal and round.   HENT:Mouth mucous membranes dry.   Neck: supple, symmetrical, trachea midline  Lungs: clear to auscultation bilaterally.   Cardiovascular: RRR, no r/m/g, good peripheral pulses  Abdomen: soft, non-tender and non-distended  Extremities: no cyanosis or edema  Skin: low abdominal incision c/d/i   Neurologic: grossly normal, CN II - XII grosly intact  and alert and oriented x3  Psychiatric: normal      Labs:  Results for orders placed during the hospital encounter of 12/31/10 (from the past 12 hour(s))   H & H   Component Value Range   . HGB 10.9 (*) 11.5 - 15.2 (g/dL)    . HCT 31.3 (*) 33.5 - 45.2 (%)   BASIC METABOLIC PANEL, NON-FASTING   Component Value Range   . SODIUM 139  136 - 145 (mmol/L)   . POTASSIUM 4.7  3.5 - 5.1 (mmol/L)   . CHLORIDE 107  96 - 111 (mmol/L)   . CARBON DIOXIDE 23  22 - 32 (mmol/L)   . ANION GAP 9  5 - 16 (mmol/L)   . CREATININE 1.85 (*) 0.49 - 1.10 (mg/dL)   . ESTIMATED GLOMERULAR FILTRATION RATE 27 (*) >59 (ml/min/1.35m2)   . GLUCOSE,NONFAST 118  65 - 139 (mg/dL)   . BUN 26 (*) 6 - 20 (mg/dL)   . BUN/CREAT RATIO 13  6 - 22    . CALCIUM 7.7 (*) 8.5 - 10.4 (mg/dL)   MAGNESIUM   Component Value Range   . MAGNESIUM 2.0  1.7 - 2.5 (mg/dL)   PHOSPHORUS   Component Value Range   . PHOSPHORUS 3.3  2.4 - 4.7 (mg/dL)   CBC/DIFF   Component Value Range   . WBC 17.0 (*) 3.5 - 11.0 (THOU/uL)   . RBC 3.68 (*) 3.84 - 5.04 (MIL/uL)   . HGB 10.8 (*) 11.5 - 15.2 (g/dL)   . HCT 32.2 (*) 33.5 - 45.2 (%)   . MCV 87.6  82.0 - 99.0 (fL)   . MCH 29.4  27.4 - 33.0 (pg)   . MCHC 33.6  31.6 - 35.5 (g/dL)   . RDW 13.6  10.2 - 14.0 (%)   . PLATELET COUNT 123 (*) 140 - 450 (THOU/uL)   . MPV 7.7  7.4 - 10.4 (FL)   . PMN'S 78 (*) 40 - 75 (%)   . PMN ABS 13.400 (*) 1.5 - 7.7 (THOU/uL)   . LYMPHOCYTES 13 (*) 20 - 45 (%)   . LYMPHS ABS 2.130  1.0 - 4.8 (THOU/uL)   . MONOCYTES 8  4 - 13 (%)   . MONOS ABS 1.340 (*) 0.1 - 0.9 (THOU/uL)   . EOSINOPHIL 1  1 - 6 (%)   . EOS ABS 0.143  0.1 - 0.3 (THOU/uL)   . BASOPHILS 0  0 - 1 (%)   . BASOS ABS  0.015  0.0 - 0.2 (THOU/uL)   . NRBC'S 0  0 (/100WBC)   BLOOD GAS/COOX/LYTES/LAC/HCT (PICU/NICU ONLY)   Component Value Range   . %FIO2 28  21 - 100 (%)   . PH 7.360  7.350 - 7.450    . PCO2 38.0  33.1 - 43.1 (mm Hg)   . PO2 90  72 - 100 (mm Hg)   . BICARBONATE 22.1  18.0 - 29.0 (mmol/L)   . BASE EXCESS Test Not Performed  0.0 - 3.0 (mmol/L)   . BASE DEFICIT 3.6 (*) 0.0 - 3.0 (mmol/L)   . SPECIMEN TYPE ARTERIAL     . PIO2/FIO2 RATIO 321  >300    . HEMOGLOBIN 10.8 (*) 12.0 - 16.0 (g/dL)   . OXYHEMOGLOBIN 95.9  85.0 - 98.0 (%)    . CARBOXYHEMOGLOBIN 0.6  0.0 - 2.5 (%)   . MET-HEMOGLOBIN 0.8  0.0 - 3.0 (%)   . O2CT 14.7 (*) 15.7 - 21.6    . RTCX SPEC TYPE ARTERIAL     . WHOLE BLOOD K+ 3.8  3.5 - 5.0 (mmol/L)   . IONIZED CALCIUM 1.14 (*) 1.30 - 1.46 (mmol/L)   . SODIUM 137  136 - 145 (mmol/L)   . LACTATE 0.6  <1.3 (mmol/L)   . CHLORIDE 108  96 - 111 (mmol/L)   . GLUCOSE 124 (*) 70 - 105 (mg/dL)   . HEMATOCRIT 32 (*) 33.5 - 45.2 (%)           I/O:  I/O last 24 hours:    Intake/Output Summary (Last 24 hours) at 01/03/11 0654  Last data filed at 01/03/11 0600   Gross per 24 hour   Intake 1870.1 ml   Output   1839 ml   Net   31.1 ml       I/O current shift:  03/16 0000 - 03/16 0759  In: 698.8 [I.V.:448.8; Blood:250]  Out: 830 [Urine:800; Drains:30]    Drips:   IV Drips   Medication Dose Frequency Last Rate   . insulin regular human (HUMULIN R) 250 Units in NS 250 mL infusion  1 Units/hr Continuous 0 Units/hr (01/02/11 2000)   . NS premix infusion    Continuous 50 mL/hr (01/02/11 1800)   . DISCONTD: NS premix infusion    Continuous 50 mL/hr (01/02/11 1100)   . DISCONTD: D5W NS premix infusion    Continuous 50 mL/hr (01/02/11 1230)   . HYDROmorphone (DILAUDID) 1 mg/mL (tot vol 100 mL) in NS PCA   1 Bag Q1H PRN 1 Bag (01/01/11 1815)   . DISCONTD: D5W 1,000 mL with sodium bicarbonate 150 mEq infusion   Continuous 50 mL/hr (01/01/11 1800)         Drains: Foley surgical drain, Ileoconduit and NG    Lines (Type and Location)  Date Placed  Date Changed  Necessity Review   1.R TL IJ 3/13     2. R fem a-line (placed in OR) 3/13         Prophylaxis:  Date Started Date Completed   DVT/PE: SCDs/ Venodynes     GI: Proton Pump inhibitor         Antibiotics: Date Started Date Completed   1. Ceftriaxone 3/14 3/16     Nutrition/Residuals:  NPO    Microbiology:   3/15 Blood cultures: NGTD  3/15 urine cx: pending    Assessment/ Plan:   Active Hospital Problems   Diagnoses   . Primary Problem: Bladder cancer   .  Hypocalcemia   . Metabolic acidosis   . Hypomagnesemia    . Hx of total cystectomy   . Hx of unilateral nephrectomy   . Respiratory failure following trauma and surgery   . AKI (acute kidney injury)   . Type 2 diabetes mellitus   . Hyperlipidemia       Neuro  GCS 15, AOx3  Dilaudid PCA   Celexa  Good pain control  OOB    Resp/Vent  2L NC with oxygen sats 95-99%  ABG normal. Base deficit 3.6  Aggressive pulmonary toilet  OOB    CV  MAP 60-94  [id number]  Zocor  Labetalol and hydralazine PRN  Restart home meds for better blood pressure control    Renal/Lytes  Stent revision yesterday with improved renal fx  Good UOP  Net I/O -  Creatinine improved 1.85     GI/Nutrition  NPO while has NG. Will discuss with Urology  Nexiium, Colace, Senokot  Zofran  Prenatal  Alb 3.1 / Prealb 9.7  Foley drain minimal output (0-23ml/hr)    Heme  H&H 10.8/32.2 (stable)  PLT 123 (stable)  DVT ppx SCDs    ID/ABX/Lines  Afebrile  WBC 17 (trending down)  Blood cx: NGTD  Urine cx: pending  Ceftriaxone day 4/4    Endo  SSIP and Lantus 30 units nightly  FSB 119-140    Goals:  Nutrition  DVT ppx  Transfer out of unti    Iona Beard, MD 01/03/2011, 6:54 AM          SICU Attending Note 01/03/2011    I performed a history and physical exam of the patient and discussed management with the resident. Please refer to SICU note dated 01/03/2011 I reviewed the resident's note and agree with the documented findings and plan of care except as noted with the following additions noted above.      Critical Care Attestation     I was present at the bedside of this critically ill patient for 40 minutes exclusive of procedures.  This patient suffers from failure or dysfunction of 5 system(s).  The care of this patient was in regard to managing (a) conditions(s) that has a high probability of sudden, clinically significant, or life-threatening deterioration and required a high degree of Attending Physician attention and direct involvement to intervene urgently. Data review and care planning was performed in direct proximity of the patient, examination was obviously performed in direct contact with the patient. All of this time was exclusive of procedure which will be documented elsewhere in the chart.    My critical care time involved full attention to the patients' condition and included:    Review of nursing notes and/or old charts  Review of medications, allergies, and vital signs  Documentation time  Consultant collaboration on findings and treatment options  Care, transfer of care, and discharge plans  Ordering, interpreting, and reviewing diagnostic studies/tab tests  Obtaining necessary history from family, EMS, nursing home staff and/or treating physicians    My critical care time did not include time spent teaching resident physician(s) or other services of resident physicians, or performing other reported procedures.  Total Critical Care Time: 40 minutes    Electronically Signed:   Lowella Petties, MD 01/03/2011, 2:36 PM        I saw and examined the patient.  I reviewed the resident's note.  I agree with the findings and plan of care as documented in the resident's note.  Any exceptions/additions are edited/noted.  Lowella Petties, MD 01/03/2011, 2:37 PM

## 2011-01-03 NOTE — Care Management Notes (Signed)
Care Coordinator/Social Work Plan  [name]  Patient Name: Christy Hale MRN: [id number] Acct Number: [id number]  DOB: 08-Aug-1941 Age: 70  **Admission Information**  Patient Type: INPATIENT  Admit Date: 12/31/2010 Admit Time: 04:52  Admit Reason: BLADDER CANCER  Admitting Phys: Caryl Ada  Attending Phys: Caryl Ada  Unit: SICU Bed: SICU-09  130. CM Assessment Notes  Created by : Olene Floss Date/Time 2011-01-03 15:11:[id number]  Assessment Notes  Note:  Note: PAS completed and task sent to resource center for SNF referrals.  Consult and Reassessment Will continue to re-assess for changes in plan of care or discharge needs

## 2011-01-03 NOTE — Progress Notes (Addendum)
[facility]                                                  UROLOGY  PROGRESS NOTE    Chantalle, Defilippo, 70 y.o. female  Date of Admission:  12/31/2010  Date of Birth:  June 15, 1941  Date of Service:  01/03/2011    Post Op Day: 3 Days Post-Op S/P Procedure(s) (LRB):  CYSTECTOMY RADICAL (N/A)  PLACEMENT CONDUIT URINARY (N/A)    Subjective: No acute urologic events, remains in SICU, stent exchanged at bedside yesterday    Vital Signs:  Temp (24hrs) Max:37 C (98.6 F)      Temperature: 36.8 C (98.2 F) (01/03/11 0800)  BP (Non-Invasive): 132/59 mmHg (01/02/11 1830)  MAP (Non-Invasive): 87 mmHG (01/02/11 1830)  Heart Rate: 72  (01/03/11 0900)  Respiratory Rate: 14  (01/03/11 0900)  Pain Score: 0 (01/03/11 0800)  SpO2-1: 94 % (01/03/11 0900)    Current Medications:    Current facility-administered medications:  insulin glargine (LANTUS) 100 units/mL injection  30 Units Subcutaneous NIGHTLY   docusate sodium (COLACE) 10mg  per mL oral liquid 100 mg Gastric (NG, OG, PEG, GT) 2x/day   citalopram (CELEXA) tablet  40 mg Gastric (NG, OG, PEG, GT) Daily   esomeprazole (NEXIUM) capsule  40 mg Gastric (NG, OG, PEG, GT) QAM   acetaminophen (TYLENOL) tablet  650 mg Gastric (NG, OG, PEG, GT) Q6H PRN   prenatal vitamin-iron fumarate-folic acid  (NATALCARE PLUS) 27-0.8mg  per tablet  1 Tab Gastric (NG, OG, PEG, GT) Daily   sennosides (SENOKOT) 8.8mg  per 5mL oral liquid  5 mL Gastric (NG, OG, PEG, GT) 2x/day   simvastatin (ZOCOR) tablet  40 mg Gastric (NG, OG, PEG, GT) QPM   NS premix infusion   Intravenous Continuous   sodium phosphate 15 mmol in D5W 100 mL IVPB 15 mmol Intravenous Once   cefTRIAXone (ROCEPHIN) 1 g in NS 50 mL IVPB 1 g Intravenous Q24H   HYDROmorphone (DILAUDID) 1 mg/mL (tot vol 100 mL) in NS PCA  1 Bag Intravenous Q1H PRN   SSIP insulin R human (HUMULIN R) 100 units/mL injection  4-12 Units Subcutaneous 4x/day PRN    ondansetron (ZOFRAN) 2 mg/mL injection  4 mg Intravenous Q6H PRN   NS 10 mL injection  2 mL Intracatheter Q8HRS   NS 10 mL injection  2-6 mL Intracatheter Q1 MIN PRN   labetalol (TRANDATE) 5 mg/mL injection  5 mg Intravenous Q2H PRN   hydrALAZINE (APRESOLINE) injection 10 mg 10 mg Intravenous Q4H PRN         Today's Physical Exam:  Constitutional - NAD  Eyes - Clear  Neuro - alert  CV - RR  Pulm - CTA B/L  GI - Soft, stoma is pink/viable, incision c/d/i  Skin - Warm and Dry  GU - pelvic drain in place with serosanguinous drainage      I/O:   Intake/Output Summary (Last 24 hours) at 01/03/11 0914  Last data filed at 01/03/11 0900   Gross per 24 hour   Intake 1785.6 ml   Output   1913 ml   Net -127.4 ml       I/O current shift:  03/16 0800 - 03/16 1559  In: 238 [P.O.:70; I.V.:168]  Out: 290 [Urine:240; Drains:50]    Prophylaxis:  Date Started Date Completed   DVT/PE SCDs/  Venodynes     GI         Nutrition/Residuals:  NPO       Labs  Please indicate ordered or reviewed)  Reviewed: BMP:     Recent Labs   Basename 01/03/11 0830 01/03/11 0040   . SODIUM 137 --   . POTASSIUM -- 4.7   . CHLORIDE 109 --   . CO2 -- 23   . BUN -- 26*   . CREATININE -- 1.85*   . GLUCOSENF -- 118   . GLUCOSEFAST -- --   . ANIONGAP -- 9   . BUNCRRATIO -- 13   . GFR -- 27*   . CALCIUM -- 7.7*       CBC Results Differential Results   Recent Labs   Operating Room Services 01/03/11 0040   . WBC 17.0*   . HGB 10.8*   . HCT 32.2*   . PLTCNT 123*      Recent Results (from the past 30 hour(s))   CBC/DIFF    Collection Time    01/03/11 12:40 AM   Component Value Comment   . WBC 17.0    . PMN'S 78    . LYMPHOCYTES 13    . MONOCYTES 8    . EOSINOPHIL 1    . BASOPHILS 0    . NRBC'S 0             Assessment/ Plan:   Active Hospital Problems   (*Primary Problem)   Diagnoses   . *Bladder cancer   . Hypocalcemia   . Metabolic acidosis   . Hypomagnesemia   . Hx of total cystectomy   . Hx of unilateral nephrectomy   . Respiratory failure following trauma and surgery    . AKI (acute kidney injury)   . Type 2 diabetes mellitus     dx 20 + yrs ago, does not check fs at home     . Hyperlipidemia       POD 2 Open radical cystectomy with closure of vaginal cuff and urethrectomy    Doing much better this AM  UOP increased, creatinine improved  Plan to remove NGT today   Sips and chips  Start Heparin  Transfer to SDU  Cont to monitor      Italy P Hubsher, MD 01/03/2011, 9:14 AM    I saw and examined the patient.  I reviewed the resident's note.  I agree with the findings and plan of care as documented in the resident's note.  Any exceptions/additions are edited/noted.    Caryl Ada, MD 01/03/2011, 2:36 PM

## 2011-01-03 NOTE — PT Evaluation (Signed)
Physical Therapy Initial Evaluation    HPI:   Christy Hale is a 70 y.o. female admitted to [facility] for radical cystectomy/urethrectomy, Exp Lap due to recurrence of bladder CA, on 12/31/10.     Precautions: None                   Past Medical History   Diagnosis Date   . Hypertension    . Ulcer disease    . Anemia    . Lung cancer      left lung, unknown type   . Arthritis    . Diabetes mellitus    . Hyperlipidemia    . History of transfusion      no reaction   . GERD (gastroesophageal reflux disease)      hx of, no longer takes meds   . Kidney disease      s/p nephrectomy due to cancer   . Type 2 diabetes mellitus      dx 20 + yrs ago, does not check fs at home   . cancer      s/p renal cancer, lung cancer, skin cancer, and now with bladder cancer                        Past Surgical History   Procedure Date   . Hx hysterectomy    . Hx hand surgery      bilateral   . Hx foot surgery      right   . Hx other      left arm surgery   . Hx nephrectomy      with ureterectomy (left)   . Hx other      left lung cancer removal       Social Hx:  reports that she has quit smoking. She does not have any smokeless tobacco history on file.  She reports that she drinks alcohol.  She reports that she does not currently use illicit drugs.                   History   Smoking status   . Former Smoker   Smokeless tobacco   . Not on file   Comment: Pt quit smoking over 40 years, smoked 8 yrs                        Uses Devices:No                   Home Access:married, but her husband is hospitalized, awaiting CABG  Weight: 192 pounds      Subjective:   Pt stated that she has a ramp at the entrance to her home.  Her husband is in hospital awaiting surgery.   She was functioning independently PTA.    Pain Assessment: "I've got a little pain."  She reported that "it feels a little better" after ambulating.    Patient Goals: she wants to walk      Objective:   Seen in SICU in pm.   [name] was up in a chair, having gotten OOB with NSG.    Cognition: Alert, Awake, Cooperative and Oriented Person, Place and Time  Follows Directions: Simple       Range of Motion                Left           Right   Upper Extremities  [facility] WFL                  Lower Extremities [facility]              Strength                 Left            Right   Upper Extremities WFL WFL   Lower Extremities Hip at least 3/5  Knee 4/5  Ankle 5/5 Hip at least 3/5  Knee 4/5  Ankle 5/5                          Bed Mobility:  Supine to Sit: n/t  Transfers: Sit to Stand: CGA of 1  Balance: Sitting = good,    Standing = fair  Ambulation:  60 ft x 1 with CGA of 1.  Pt was fatigued afterwards.  Today's Treatement: AROM exercises.                           Assessment:   [name] has good potential to improve her mobility, and return to her previous level of mobility.   She will need to be fairly independent, when she returns home, as she will need to be able to care for herself.  She would benefit from a short term rehab stay in a suitable setting.    Rehab Potential: Good    Problem List: Decreased Ambulation and Decreased Transfers  Barriers to Discharge: mobility      Treatment Goals:  Patient will transfer independently by D/C to home.    Patient will ambulate independently 250 feet with an appropriate AD, by D/C to home.    Patient will ambulate up and down 1 step with supervision by D/C to home           Plan:   Current Intervention:Therapeutic Exercise and Mobility Training   To provide physical therapy services 1 times/day for  3 to 5 days/week.      Discharge Needs:   Equipment:  TBD  Further Treatment Recommendations: Short term SNF    Therapist :   Evaluation Time: 40 minutes  Therapist:  Ihor Austin Fortino Haag PT  Pager #:  [phone]

## 2011-01-03 NOTE — Consults (Addendum)
[facility]  Nephrology Consult Follow Up Note    Date of service: 01/03/2011  Hospital Day:  LOS: 3 days     SUBJECTIVE:   This is a 70 y/o female with PMH significant for TCC of the left renal pelvis, s/p left nephroureterectomy four years ago, now admitted to hospital with bladder mass s/p Radical cystectomy with creation of an ileoconduit diversion on 12/31/10, found to have deteriorating kidney functions. Yesterday after bedside looposcopy with ureteral stent exchange, her urine out put has been improved. She herself is feeling better, denied nausea, NG tube has been taken off. Continue to have urine out put from urinary catheter and from drain.      OBJECTIVE:    Vital Signs:  Temp (24hrs) Max:37 C (98.6 F)      Temperature: 37 C (98.6 F)  BP (Non-Invasive): 124/56 mmHg  MAP (Non-Invasive): 80 mmHG  Heart Rate: 65   Respiratory Rate: 33   Pain Score: 0  SpO2-1: 88 %    Systolic (24hrs), Avg:124 mmHg, Min:119 mmHg, Max:132 mmHg    Diastolic (24hrs), Avg:52 mmHg, Min:45 mmHg, Max:59 mmHg    MAP:  MAP (Non-Invasive)  Avg: 81.5 mmHG  Min: 52 mmHG  Max: 117 mmHG  Heart Rate:  Pulse  Avg: 77.1   Min: 60   Max: 90     Dialysis treatment total fluid removal:     Post dialysis weight:       Diet Order:  NPO  Nutrition:  Orders Placed This Encounter   Procedure   . MNT PROTOCOL FOR DIETITIAN       IV Fluids, Meds, and Drips:      D5W 1/2 NS Last Rate: 50 mL/hr (01/03/11 1100)   DISCONTD: insulin regular human (HUMULIN R) 250 units in NS infusion Last Rate: 0 Units/hr (01/02/11 2000)   DISCONTD: NS Last Rate: 50 mL/hr (01/02/11 1100)   DISCONTD: D5W NS Last Rate: 50 mL/hr (01/02/11 1230)   DISCONTD: NS Last Rate: 50 mL/hr (01/02/11 1800)   DISCONTD: hydromorphone in NS Last Rate: 1 Bag (01/01/11 1815)         I/O:  I/O last 24 hours:    Intake/Output Summary (Last 24 hours) at 01/03/11 1614  Last data filed at 01/03/11 1600   Gross per 24 hour   Intake 1720.57 ml   Output   1488 ml    Net 232.57 ml       I/O current shift:  03/16 1600 - 03/16 2359  In: 50 [I.V.:50]  Out: -   I/O last 3 completed shifts:  In: 1728.77 [P.O.:100; I.V.:1378.77; Blood:250]  Out: 1648 [Urine:1428; Drains:120; NG/OG/GT:100]    Labs:  Lab Results for Last 24 Hours:    Results for orders placed during the hospital encounter of 12/31/10 (from the past 24 hour(s))   POCT WHOLE BLOOD GLUCOSE   Component Value Range   . GLUCOSE, POINT OF CARE 104  70 - 105 (mg/dL)   H & H   Component Value Range   . HGB 10.9 (*) 11.5 - 15.2 (g/dL)   . HCT 31.8 (*) 33.5 - 45.2 (%)   BLOOD GAS/COOX/LYTES/LAC/HCT (PICU/NICU ONLY)   Component Value Range   . %FIO2 28  21 - 100 (%)   . PH 7.380  7.350 - 7.450    . PCO2 38.0  33.1 - 43.1 (mm Hg)   . PO2 99  72 - 100 (mm Hg)   . BICARBONATE 23.1  18.0 - 29.0 (mmol/L)   . BASE EXCESS Test Not Performed  0.0 - 3.0 (mmol/L)   . BASE DEFICIT 2.3  0.0 - 3.0 (mmol/L)   . SPECIMEN TYPE ARTERIAL     . PIO2/FIO2 RATIO 354  >300    . HEMOGLOBIN 11.2 (*) 12.0 - 16.0 (g/dL)   . OXYHEMOGLOBIN 95.9  85.0 - 98.0 (%)   . CARBOXYHEMOGLOBIN 0.3  0.0 - 2.5 (%)   . MET-HEMOGLOBIN 0.9  0.0 - 3.0 (%)   . O2CT 15.2 (*) 15.7 - 21.6    . RTCX SPEC TYPE ARTERIAL     . WHOLE BLOOD K+ 3.5  3.5 - 5.0 (mmol/L)   . IONIZED CALCIUM 1.18 (*) 1.30 - 1.46 (mmol/L)   . SODIUM 137  136 - 145 (mmol/L)   . LACTATE 0.7  <1.3 (mmol/L)   . CHLORIDE 107  96 - 111 (mmol/L)   . GLUCOSE 92  70 - 105 (mg/dL)   . HEMATOCRIT 34  33.5 - 45.2 (%)   POCT WHOLE BLOOD GLUCOSE   Component Value Range   . GLUCOSE, POINT OF CARE 76  70 - 105 (mg/dL)   POCT WHOLE BLOOD GLUCOSE   Component Value Range   . GLUCOSE, POINT OF CARE 81  70 - 105 (mg/dL)   H & H   Component Value Range   . HGB 10.9 (*) 11.5 - 15.2 (g/dL)   . HCT 31.3 (*) 33.5 - 45.2 (%)   POCT WHOLE BLOOD GLUCOSE   Component Value Range   . GLUCOSE, POINT OF CARE 108 (*) 70 - 105 (mg/dL)   POCT WHOLE BLOOD GLUCOSE   Component Value Range   . GLUCOSE, POINT OF CARE 124 (*) 70 - 105 (mg/dL)    BASIC METABOLIC PANEL, NON-FASTING   Component Value Range   . SODIUM 139  136 - 145 (mmol/L)   . POTASSIUM 4.7  3.5 - 5.1 (mmol/L)   . CHLORIDE 107  96 - 111 (mmol/L)   . CARBON DIOXIDE 23  22 - 32 (mmol/L)   . ANION GAP 9  5 - 16 (mmol/L)   . CREATININE 1.85 (*) 0.49 - 1.10 (mg/dL)   . ESTIMATED GLOMERULAR FILTRATION RATE 27 (*) >59 (ml/min/1.41m2)   . GLUCOSE,NONFAST 118  65 - 139 (mg/dL)   . BUN 26 (*) 6 - 20 (mg/dL)   . BUN/CREAT RATIO 13  6 - 22    . CALCIUM 7.7 (*) 8.5 - 10.4 (mg/dL)   MAGNESIUM   Component Value Range   . MAGNESIUM 2.0  1.7 - 2.5 (mg/dL)   PHOSPHORUS   Component Value Range   . PHOSPHORUS 3.3  2.4 - 4.7 (mg/dL)   CBC/DIFF   Component Value Range   . WBC 17.0 (*) 3.5 - 11.0 (THOU/uL)   . RBC 3.68 (*) 3.84 - 5.04 (MIL/uL)   . HGB 10.8 (*) 11.5 - 15.2 (g/dL)   . HCT 32.2 (*) 33.5 - 45.2 (%)   . MCV 87.6  82.0 - 99.0 (fL)   . MCH 29.4  27.4 - 33.0 (pg)   . MCHC 33.6  31.6 - 35.5 (g/dL)   . RDW 13.6  10.2 - 14.0 (%)   . PLATELET COUNT 123 (*) 140 - 450 (THOU/uL)   . MPV 7.7  7.4 - 10.4 (FL)   . PMN'S 78 (*) 40 - 75 (%)   . PMN ABS 13.400 (*) 1.5 - 7.7 (THOU/uL)   . LYMPHOCYTES 13 (*) 20 -  45 (%)   . LYMPHS ABS 2.130  1.0 - 4.8 (THOU/uL)   . MONOCYTES 8  4 - 13 (%)   . MONOS ABS 1.340 (*) 0.1 - 0.9 (THOU/uL)   . EOSINOPHIL 1  1 - 6 (%)   . EOS ABS 0.143  0.1 - 0.3 (THOU/uL)   . BASOPHILS 0  0 - 1 (%)   . BASOS ABS 0.015  0.0 - 0.2 (THOU/uL)   . NRBC'S 0  0 (/100WBC)   POCT WHOLE BLOOD GLUCOSE   Component Value Range   . GLUCOSE, POINT OF CARE 138 (*) 70 - 105 (mg/dL)   BLOOD GAS/COOX/LYTES/LAC/HCT (PICU/NICU ONLY)   Component Value Range   . %FIO2 28  21 - 100 (%)   . PH 7.360  7.350 - 7.450    . PCO2 38.0  33.1 - 43.1 (mm Hg)   . PO2 90  72 - 100 (mm Hg)   . BICARBONATE 22.1  18.0 - 29.0 (mmol/L)   . BASE EXCESS Test Not Performed  0.0 - 3.0 (mmol/L)   . BASE DEFICIT 3.6 (*) 0.0 - 3.0 (mmol/L)   . SPECIMEN TYPE ARTERIAL     . PIO2/FIO2 RATIO 321  >300    . HEMOGLOBIN 10.8 (*) 12.0 - 16.0 (g/dL)    . OXYHEMOGLOBIN 95.9  85.0 - 98.0 (%)   . CARBOXYHEMOGLOBIN 0.6  0.0 - 2.5 (%)   . MET-HEMOGLOBIN 0.8  0.0 - 3.0 (%)   . O2CT 14.7 (*) 15.7 - 21.6    . RTCX SPEC TYPE ARTERIAL     . WHOLE BLOOD K+ 3.8  3.5 - 5.0 (mmol/L)   . IONIZED CALCIUM 1.14 (*) 1.30 - 1.46 (mmol/L)   . SODIUM 137  136 - 145 (mmol/L)   . LACTATE 0.6  <1.3 (mmol/L)   . CHLORIDE 108  96 - 111 (mmol/L)   . GLUCOSE 124 (*) 70 - 105 (mg/dL)   . HEMATOCRIT 32 (*) 33.5 - 45.2 (%)   POCT WHOLE BLOOD GLUCOSE   Component Value Range   . GLUCOSE, POINT OF CARE 140 (*) 70 - 105 (mg/dL)   POCT WHOLE BLOOD GLUCOSE   Component Value Range   . GLUCOSE, POINT OF CARE 120 (*) 70 - 105 (mg/dL)   POCT WHOLE BLOOD GLUCOSE   Component Value Range   . GLUCOSE, POINT OF CARE 119 (*) 70 - 105 (mg/dL)   BLOOD GAS/COOX/LYTES/LAC/HCT (PICU/NICU ONLY)   Component Value Range   . %FIO2 28  21 - 100 (%)   . PH 7.350  7.350 - 7.450    . PCO2 40.0  33.1 - 43.1 (mm Hg)   . PO2 105 (*) 72 - 100 (mm Hg)   . BICARBONATE 22.3  18.0 - 29.0 (mmol/L)   . BASE EXCESS Test Not Performed  0.0 - 3.0 (mmol/L)   . BASE DEFICIT 3.3 (*) 0.0 - 3.0 (mmol/L)   . SPECIMEN TYPE ARTERIAL     . PIO2/FIO2 RATIO 375  >300    . HEMOGLOBIN 10.4 (*) 12.0 - 16.0 (g/dL)   . OXYHEMOGLOBIN 95.9  85.0 - 98.0 (%)   . CARBOXYHEMOGLOBIN 1.1  0.0 - 2.5 (%)   . MET-HEMOGLOBIN 0.9  0.0 - 3.0 (%)   . O2CT 14.2 (*) 15.7 - 21.6    . RTCX SPEC TYPE ARTERIAL     . WHOLE BLOOD K+ 4.0  3.5 - 5.0 (mmol/L)   . IONIZED CALCIUM 1.17 (*) 1.30 - 1.46 (  mmol/L)   . SODIUM 137  136 - 145 (mmol/L)   . LACTATE 0.7  <1.3 (mmol/L)   . CHLORIDE 109  96 - 111 (mmol/L)   . GLUCOSE 134 (*) 70 - 105 (mg/dL)   . HEMATOCRIT 31 (*) 33.5 - 45.2 (%)   POCT WHOLE BLOOD GLUCOSE   Component Value Range   . GLUCOSE, POINT OF CARE 152 (*) 70 - 105 (mg/dL)   POCT WHOLE BLOOD GLUCOSE   Component Value Range   . GLUCOSE, POINT OF CARE 118 (*) 70 - 105 (mg/dL)         Imaging:  N/A    Current Medications:    Current facility-administered medications:   heparin 5,000 unit/mL injection 5,000 Units 5,000 Units Subcutaneous Q8HRS   D5W 1/2 NS premix infusion   Intravenous Continuous   verapamil extended release tablet  240 mg Oral Daily with Breakfast   HYDROcodone-acetaminophen (NORCO) 5-325 mg per tablet  1 Tab Oral Q4H PRN   sennosides-docusate sodium (SENOKOT-S) 8.6-50mg  per tablet  1 Tab Oral 2x/day   docusate sodium (COLACE) capsule  100 mg Oral 2x/day   insulin glargine (LANTUS) 100 units/mL injection  30 Units Subcutaneous NIGHTLY   citalopram (CELEXA) tablet  40 mg Gastric (NG, OG, PEG, GT) Daily   esomeprazole (NEXIUM) capsule  40 mg Gastric (NG, OG, PEG, GT) QAM   acetaminophen (TYLENOL) tablet  650 mg Gastric (NG, OG, PEG, GT) Q6H PRN   prenatal vitamin-iron fumarate-folic acid  (NATALCARE PLUS) 27-0.8mg  per tablet  1 Tab Gastric (NG, OG, PEG, GT) Daily   simvastatin (ZOCOR) tablet  40 mg Gastric (NG, OG, PEG, GT) QPM   sodium phosphate 15 mmol in D5W 100 mL IVPB 15 mmol Intravenous Once   cefTRIAXone (ROCEPHIN) 1 g in NS 50 mL IVPB 1 g Intravenous Q24H   SSIP insulin R human (HUMULIN R) 100 units/mL injection  4-12 Units Subcutaneous 4x/day PRN   ondansetron (ZOFRAN) 2 mg/mL injection  4 mg Intravenous Q6H PRN   NS 10 mL injection  2 mL Intracatheter Q8HRS   NS 10 mL injection  2-6 mL Intracatheter Q1 MIN PRN   labetalol (TRANDATE) 5 mg/mL injection  5 mg Intravenous Q2H PRN   hydrALAZINE (APRESOLINE) injection 10 mg 10 mg Intravenous Q4H PRN         Physical Exam:  General: Laying on the bed comfortably with oxygen by NC, NG tube in place and right sided ileo conduit diversion.   Eyes: Conjunctiva clear.   HENT:Head atraumatic and normocephalic, right sided central line is in place.   Neck: No JVD or thyromegaly or lymphadenopathy   Lungs: Clear to auscultation bilaterally. , with basal crepitation.   Cardiovascular: regular rate and rhythm    Abdomen: Soft, non-tender, Bowel sounds normal, right sided ileo conduit diversion, midline post surgical dressing above the scar.   Extremities: No cyanosis or edema   Skin: Skin warm and dry   Neurologic: Grossly normal   Lymphatics: No lymphadenopathy   Psychiatric: Normal    Impression/Recommendations:      This is a 70 y/o female with PMH significant for TCC of the left renal pelvis, s/p left nephroureterectomy four years ag.    - Her creatinine starting to improve, after bedside looposcopy with ureteral stent exchange, her urine out put has improved both from foley catheter and from drain. 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  - Get CPK  along with next lab.  - Follow electrolytes, BUN and creatinine daily.   - Continue with same IVF , NS 50 ml per hour.  - Monitor intake and out put.  - Will follow.        Jeanice Lim, MD 01/03/2011, 4:14 PM        I saw and examined the patient.  I reviewed the resident's note.  I agree with the findings and plan of care as documented in the resident's note.  Any exceptions/additions are edited/noted.    Early Osmond, MD 01/03/2011, 10:12 PM

## 2011-01-03 NOTE — Care Management Notes (Signed)
Care Coordinator/Social Work Plan  [place]  Patient Name: Christy Hale MRN: [id number] Acct Number: [id number]  DOB: 05-09-1941 Age: 70  **Admission Information**  Patient Type: INPATIENT  Admit Date: 12/31/2010 Admit Time: 04:52  Admit Reason: BLADDER CANCER  Admitting Phys: Caryl Ada  Attending Phys: Caryl Ada  Unit: SICU Bed: SICU-09  130. CM Assessment Notes  Created by : Judeth Cornfield Date/Time 2011-01-03 [id number]  Assessment Notes  Note:  Note: HD # 3. Clincial: Received call from Dr. Roxy Cedar regarding patients disposition. Discussed that patient would  require therapy for deconditioning and that she would need teaching assistance with ostomy appliance, etc. Patient also  does not have anyone in home that could provide support in home. PT/OT to evaluated with recs. Discussed with therapist  and will eval. Met with patient and discussed possible needs and that she would not be safe to return home with her  needs. Patient in agreement. Discussd acute rehab, SNF and HH. Patient states she does not want acute rehab because she  thinks she would not be strong enough to do three hours of therapy a day. I informed her that [facility] would only come in 1  hour 3 x week at most-patient stated that she would need more assistance. Patient requesting SNF-states she would like  to go to [facility] in [place] or [facility] in [place]. MSW [name] assisting. CCC completed choice form for SNF  placement with her preferences. MSW assisting with completing PAS. Dr. Roxy Cedar transferring patient to stepdown today.  anticipates possibly d/c ready for Monday or Tuesday to SNF if meddicaly stable. Discharge plan: Anticipate discharge  to SNF in [place] or [place] area once medically stable. Anticipate d/c ready Mon/Tuesday of next week per Dr.  Roxy Cedar. MSW assisting with placement needs.   Consult and Reassessment Will continue to re-assess for changes in plan of care or discharge needs

## 2011-01-04 ENCOUNTER — Encounter (HOSPITAL_COMMUNITY): Payer: Self-pay

## 2011-01-04 LAB — CBC/DIFF
BASOPHILS: 0 % (ref 0–1)
BASOS ABS: 0.04 THOU/uL (ref 0.0–0.2)
EOS ABS: 0.359 THOU/uL — ABNORMAL HIGH (ref 0.1–0.3)
EOSINOPHIL: 3 % (ref 1–6)
HCT: 29.4 % — ABNORMAL LOW (ref 33.5–45.2)
HGB: 9.7 g/dL — ABNORMAL LOW (ref 11.5–15.2)
LYMPHOCYTES: 19 % — ABNORMAL LOW (ref 20–45)
LYMPHS ABS: 2.52 THOU/uL (ref 1.0–4.8)
MCH: 29.2 pg (ref 27.4–33.0)
MCHC: 33 g/dL (ref 31.6–35.5)
MCV: 88.5 fL (ref 82.0–99.0)
MONOCYTES: 9 % (ref 4–13)
MONOS ABS: 1.16 THOU/uL — ABNORMAL HIGH (ref 0.1–0.9)
MPV: 8.1 FL (ref 7.4–10.4)
NRBC'S: 0 /100{WBCs}
PLATELET COUNT: 135 THOU/uL — ABNORMAL LOW (ref 140–450)
PMN ABS: 9.14 THOU/uL — ABNORMAL HIGH (ref 1.5–7.7)
PMN'S: 69 % (ref 40–75)
RBC: 3.33 MIL/uL — ABNORMAL LOW (ref 3.84–5.04)
RDW: 13.4 % (ref 10.2–14.0)
WBC: 13.2 THOU/uL — ABNORMAL HIGH (ref 3.5–11.0)

## 2011-01-04 LAB — BASIC METABOLIC PANEL
ANION GAP: 7 mmol/L (ref 5–16)
BUN/CREAT RATIO: 15 (ref 6–22)
BUN: 16 mg/dL (ref 6–20)
CALCIUM: 7.6 mg/dL — ABNORMAL LOW (ref 8.5–10.4)
CARBON DIOXIDE: 25 mmol/L (ref 22–32)
CHLORIDE: 107 mmol/L (ref 96–111)
CREATININE: 1.1 mg/dL (ref 0.49–1.10)
ESTIMATED GLOMERULAR FILTRATION RATE: 49 ml/min/1.73m2 — ABNORMAL LOW (ref >59)
GLUCOSE,NONFAST: 103 mg/dL (ref 65–139)
POTASSIUM: 4.2 mmol/L (ref 3.5–5.1)
SODIUM: 139 mmol/L (ref 136–145)

## 2011-01-04 LAB — PHOSPHORUS: PHOSPHORUS: 2.6 mg/dL (ref 2.4–4.7)

## 2011-01-04 LAB — URINE CULTURE,ROUTINE: CULTURE OBSERVATION: NO GROWTH

## 2011-01-04 LAB — PERFORM POC WHOLE BLOOD GLUCOSE
GLUCOSE, POINT OF CARE: 112 mg/dL — ABNORMAL HIGH (ref 70–105)
GLUCOSE, POINT OF CARE: 144 mg/dL — ABNORMAL HIGH (ref 70–105)

## 2011-01-04 LAB — MAGNESIUM: MAGNESIUM: 1.7 mg/dL (ref 1.7–2.5)

## 2011-01-04 NOTE — Progress Notes (Addendum)
[facility]                                                  UROLOGY  PROGRESS NOTE    Christy Hale, Christy Hale, 70 y.o. female  Date of Admission:  12/31/2010  Date of Birth:  [birth year]  Date of Service:  01/04/2011    Post Op Day: 4 Days Post-Op S/P Procedure(s) (LRB):  CYSTECTOMY RADICAL (N/A)  PLACEMENT CONDUIT URINARY (N/A)    Subjective: No acute urologic events,currently SDU status    Vital Signs:  Temp (24hrs) Max:37 C (98.6 F)      Temperature: 36.9 C (98.4 F) (01/04/11 0800)  BP (Non-Invasive): 155/72 mmHg (01/04/11 0900)  MAP (Non-Invasive): 119 mmHG (01/04/11 0400)  Heart Rate: 53  (01/04/11 0900)  Respiratory Rate: 18  (01/04/11 0900)  Pain Score: 0 (01/04/11 0830)  SpO2-1: 99 % (01/04/11 0900)    Current Medications:    Current facility-administered medications:  heparin 5,000 unit/mL injection 5,000 Units 5,000 Units Subcutaneous Q8HRS   D5W 1/2 NS premix infusion   Intravenous Continuous   verapamil extended release tablet  240 mg Oral Daily with Breakfast   HYDROcodone-acetaminophen (NORCO) 5-325 mg per tablet  1 Tab Oral Q4H PRN   sennosides-docusate sodium (SENOKOT-S) 8.6-50mg  per tablet  1 Tab Oral 2x/day   docusate sodium (COLACE) capsule  100 mg Oral 2x/day   insulin glargine (LANTUS) 100 units/mL injection  30 Units Subcutaneous NIGHTLY   citalopram (CELEXA) tablet  40 mg Gastric (NG, OG, PEG, GT) Daily   esomeprazole (NEXIUM) capsule  40 mg Gastric (NG, OG, PEG, GT) QAM   acetaminophen (TYLENOL) tablet  650 mg Gastric (NG, OG, PEG, GT) Q6H PRN   prenatal vitamin-iron fumarate-folic acid  (NATALCARE PLUS) 27-0.8mg  per tablet  1 Tab Gastric (NG, OG, PEG, GT) Daily   simvastatin (ZOCOR) tablet  40 mg Gastric (NG, OG, PEG, GT) QPM   sodium phosphate 15 mmol in D5W 100 mL IVPB 15 mmol Intravenous Once   SSIP insulin R human (HUMULIN R) 100 units/mL injection  4-12 Units Subcutaneous 4x/day PRN    ondansetron (ZOFRAN) 2 mg/mL injection  4 mg Intravenous Q6H PRN   NS 10 mL injection  2 mL Intracatheter Q8HRS   NS 10 mL injection  2-6 mL Intracatheter Q1 MIN PRN   labetalol (TRANDATE) 5 mg/mL injection  5 mg Intravenous Q2H PRN   hydrALAZINE (APRESOLINE) injection 10 mg 10 mg Intravenous Q4H PRN         Today's Physical Exam:  Constitutional - NAD  Eyes - Clear  Neuro - alert  CV - RR  Pulm - CTA B/L  GI - Soft, stoma is pink/viable, incision c/d/i  Skin - Warm and Dry  GU - pelvic drain in place with serosanguinous drainage      I/O:   Intake/Output Summary (Last 24 hours) at 01/04/11 1019  Last data filed at 01/04/11 0900   Gross per 24 hour   Intake   1278 ml   Output   2248 ml   Net   -970 ml       I/O current shift:  03/17 0800 - 03/17 1559  In: 100 [I.V.:100]  Out: 400 [Urine:400]    Prophylaxis:  Date Started Date Completed   DVT/PE SCDs/ Venodynes     GI  Nutrition/Residuals:  Clear Liquid       Labs  Please indicate ordered or reviewed)  Reviewed: BMP:     Recent Labs   [facility] 01/04/11 0108   . SODIUM 139   . POTASSIUM 4.2   . CHLORIDE 107   . CO2 25   . BUN 16   . CREATININE 1.10   . GLUCOSENF 103   . GLUCOSEFAST --   . ANIONGAP 7   . BUNCRRATIO 15   . GFR 49*   . CALCIUM 7.6*       CBC Results Differential Results   Recent Labs   [facility] 01/04/11 0108   . WBC 13.2*   . HGB 9.7*   . HCT 29.4*   . PLTCNT 135*      Recent Results (from the past 30 hour(s))   CBC/DIFF    Collection Time    01/04/11  1:08 AM   Component Value Comment   . WBC 13.2    . PMN'S 69    . LYMPHOCYTES 19    . MONOCYTES 9    . EOSINOPHIL 3    . BASOPHILS 0    . NRBC'S 0             Assessment/ Plan:   Active Hospital Problems   (*Primary Problem)   Diagnoses   . *Bladder cancer   . Hypocalcemia   . Metabolic acidosis   . Hypomagnesemia   . Hx of total cystectomy   . Hx of unilateral nephrectomy   . Respiratory failure following trauma and surgery   . AKI (acute kidney injury)   . Type 2 diabetes mellitus      dx 20 + yrs ago, does not check fs at home     . Hyperlipidemia       S/p Open radical cystectomy with closure of vaginal cuff and urethrectomy    Doing much better this AM  UOP increased, creatinine improved  Advance to clears  Wean O2  OOB and Continue to monitor    [place] P Hubsher, MD 01/04/2011, 10:19 AM    I saw and examined the patient.  I reviewed the resident's note.  I agree with the findings and plan of care as documented in the resident's note.  Any exceptions/additions are edited/noted.    Earnest Rosier, MD 01/05/2011, 9:56 PM

## 2011-01-04 NOTE — Consults (Addendum)
[facility]  Nephrology Consult Follow Up Note    Date of service: 01/04/2011  Hospital Day:  LOS: 4 days     SUBJECTIVE: She was seen this morning, feels good, denied any SOB/ CP/ Palpitations/ nausea/ vomiting/ abdominal pain.     OBJECTIVE: She has good urine output through Ileal Conduit and the renal parameters are nearly normalized.     Vital Signs:  Temp (24hrs) Max:36.9 C (98.4 F)      Temperature: 36.9 C (98.4 F)  BP (Non-Invasive): 165/79 mmHg  MAP (Non-Invasive): 123 mmHG  Heart Rate: 59   Respiratory Rate: 19   Pain Score: 0  SpO2-1: 100 %    Systolic (24hrs), Avg:148 mmHg, Min:109 mmHg, Max:178 mmHg    Diastolic (24hrs), Avg:64 mmHg, Min:46 mmHg, Max:79 mmHg    MAP:  MAP (Non-Invasive)  Avg: 87 mmHG  Min: 52 mmHG  Max: 123 mmHG  Heart Rate:  Pulse  Avg: 74.1   Min: 53   Max: 90     Dialysis treatment total fluid removal:     Post dialysis weight:       Diet Order:  Clear Liquid  Nutrition:  Orders Placed This Encounter   Procedure   . MNT PROTOCOL FOR DIETITIAN       IV Fluids, Meds, and Drips:      D5W 1/2 NS Last Rate: 50 mL/hr (01/04/11 0544)         I/O:  I/O last 24 hours:    Intake/Output Summary (Last 24 hours) at 01/04/11 1442  Last data filed at 01/04/11 1200   Gross per 24 hour   Intake   1210 ml   Output   2900 ml   Net  -1690 ml       I/O current shift:  03/17 0800 - 03/17 1559  In: 250 [I.V.:250]  Out: 1220 [Urine:1220]  I/O last 3 completed shifts:  In: 1416 [P.O.:130; I.V.:1286]  Out: 2138 [Urine:2048; Drains:90]    Labs:  Lab Results for Last 24 Hours:    Results for orders placed during the hospital encounter of 12/31/10 (from the past 24 hour(s))   POCT WHOLE BLOOD GLUCOSE   Component Value Range   . GLUCOSE, POINT OF CARE 165 (*) 70 - 105 (mg/dL)   POCT WHOLE BLOOD GLUCOSE   Component Value Range   . GLUCOSE, POINT OF CARE 112 (*) 70 - 105 (mg/dL)   BASIC METABOLIC PANEL, NON-FASTING   Component Value Range   . SODIUM 139  136 - 145 (mmol/L)    . POTASSIUM 4.2  3.5 - 5.1 (mmol/L)   . CHLORIDE 107  96 - 111 (mmol/L)   . CARBON DIOXIDE 25  22 - 32 (mmol/L)   . ANION GAP 7  5 - 16 (mmol/L)   . CREATININE 1.10  0.49 - 1.10 (mg/dL)   . ESTIMATED GLOMERULAR FILTRATION RATE 49 (*) >59 (ml/min/1.46m2)   . GLUCOSE,NONFAST 103  65 - 139 (mg/dL)   . BUN 16  6 - 20 (mg/dL)   . BUN/CREAT RATIO 15  6 - 22    . CALCIUM 7.6 (*) 8.5 - 10.4 (mg/dL)   MAGNESIUM   Component Value Range   . MAGNESIUM 1.7  1.7 - 2.5 (mg/dL)   PHOSPHORUS   Component Value Range   . PHOSPHORUS 2.6  2.4 - 4.7 (mg/dL)   CBC/DIFF   Component Value Range   . WBC 13.2 (*) 3.5 - 11.0 (THOU/uL)   . RBC  3.33 (*) 3.84 - 5.04 (MIL/uL)   . HGB 9.7 (*) 11.5 - 15.2 (g/dL)   . HCT 29.4 (*) 33.5 - 45.2 (%)   . MCV 88.5  82.0 - 99.0 (fL)   . MCH 29.2  27.4 - 33.0 (pg)   . MCHC 33.0  31.6 - 35.5 (g/dL)   . RDW 13.4  10.2 - 14.0 (%)   . PLATELET COUNT 135 (*) 140 - 450 (THOU/uL)   . MPV 8.1  7.4 - 10.4 (FL)   . PMN'S 69  40 - 75 (%)   . PMN ABS 9.140 (*) 1.5 - 7.7 (THOU/uL)   . LYMPHOCYTES 19 (*) 20 - 45 (%)   . LYMPHS ABS 2.520  1.0 - 4.8 (THOU/uL)   . MONOCYTES 9  4 - 13 (%)   . MONOS ABS 1.160 (*) 0.1 - 0.9 (THOU/uL)   . EOSINOPHIL 3  1 - 6 (%)   . EOS ABS 0.359 (*) 0.1 - 0.3 (THOU/uL)   . BASOPHILS 0  0 - 1 (%)   . BASOS ABS 0.040  0.0 - 0.2 (THOU/uL)   . NRBC'S 0  0 (/100WBC)   POCT WHOLE BLOOD GLUCOSE   Component Value Range   . GLUCOSE, POINT OF CARE 144 (*) 70 - 105 (mg/dL)         Imaging:  N/A    Current Medications:    Current facility-administered medications:  heparin 5,000 unit/mL injection 5,000 Units 5,000 Units Subcutaneous Q8HRS   D5W 1/2 NS premix infusion   Intravenous Continuous   verapamil extended release tablet  240 mg Oral Daily with Breakfast   HYDROcodone-acetaminophen (NORCO) 5-325 mg per tablet  1 Tab Oral Q4H PRN   sennosides-docusate sodium (SENOKOT-S) 8.6-50mg  per tablet  1 Tab Oral 2x/day   docusate sodium (COLACE) capsule  100 mg Oral 2x/day    insulin glargine (LANTUS) 100 units/mL injection  30 Units Subcutaneous NIGHTLY   citalopram (CELEXA) tablet  40 mg Gastric (NG, OG, PEG, GT) Daily   esomeprazole (NEXIUM) capsule  40 mg Gastric (NG, OG, PEG, GT) QAM   acetaminophen (TYLENOL) tablet  650 mg Gastric (NG, OG, PEG, GT) Q6H PRN   prenatal vitamin-iron fumarate-folic acid  (NATALCARE PLUS) 27-0.8mg  per tablet  1 Tab Gastric (NG, OG, PEG, GT) Daily   simvastatin (ZOCOR) tablet  40 mg Gastric (NG, OG, PEG, GT) QPM   sodium phosphate 15 mmol in D5W 100 mL IVPB 15 mmol Intravenous Once   SSIP insulin R human (HUMULIN R) 100 units/mL injection  4-12 Units Subcutaneous 4x/day PRN   ondansetron (ZOFRAN) 2 mg/mL injection  4 mg Intravenous Q6H PRN   NS 10 mL injection  2 mL Intracatheter Q8HRS   NS 10 mL injection  2-6 mL Intracatheter Q1 MIN PRN   labetalol (TRANDATE) 5 mg/mL injection  5 mg Intravenous Q2H PRN   hydrALAZINE (APRESOLINE) injection 10 mg 10 mg Intravenous Q4H PRN         Physical Exam:  Conscious, Alert, Oriented to time, place and person    HEENT- PERL, EOMI, Oral mucosa is moist    Neck- Supple, No JVD    CVS- S1 S2 RRR, No adv sounds/ rubs heard    Lungs- CTA Bilaterally, no adv sounds heard    Abdomen- soft, NT/ND, No organomegaly, Bowel sounds are present, Ileal conduit seen in RLQ, draining clear urine.     Ext- no pedal edema, peripheral pulses are palpable, no asterexis noted  CNS- appears grossly normal, No FND noted    Skin- no skin lesions noted        Impression/Recommendations:    70 Yo Caucasian female with h/o Renal cell ca s/p Lt Nephrectomy in the past, was recently diagnosed with bladder ca. She Underwent Cystectomy and creation of Ileal Conduit. During the surgery, she became hypotensive requiring multiple fluid boluses. Post operatively her renal parameters worsened and Nephrology is consulted for AKI.     AKI-    Secondary to Obstructive Uropathy and likely ischaemic ATN.      She continues to have good urine output and the renal parameters continue to improve.   She remains euvolemic, does not have any acidbase or electrolyte disturbances    She needs outpatient follow up with Nephrology post discharge    Will sign off    Please call with any questions.      Blaine Hamper, MD 01/04/2011, 2:42 PM  Nephrology Fellow  [phone]          I saw and examined the patient.  I reviewed the resident's note.  I agree with the findings and plan of care as documented in the resident's note.  Any exceptions/additions are edited/noted.    Early Osmond, MD 01/04/2011, 8:35 PM

## 2011-01-04 NOTE — Nurses Notes (Signed)
Pt received from SICU as a transfer.  Assessment as charted.  Pt denies pain or SOB at this time.

## 2011-01-05 LAB — BASIC METABOLIC PANEL
ANION GAP: 6 mmol/L (ref 5–16)
BUN/CREAT RATIO: 10 (ref 6–22)
BUN: 8 mg/dL (ref 6–20)
CALCIUM: 8.4 mg/dL — ABNORMAL LOW (ref 8.5–10.4)
CARBON DIOXIDE: 28 mmol/L (ref 22–32)
CHLORIDE: 104 mmol/L (ref 96–111)
CREATININE: 0.82 mg/dL (ref 0.49–1.10)
ESTIMATED GLOMERULAR FILTRATION RATE: >59 ml/min/1.73m2 (ref >59)
GLUCOSE,NONFAST: 150 mg/dL — ABNORMAL HIGH (ref 65–139)
POTASSIUM: 3.4 mmol/L — ABNORMAL LOW (ref 3.5–5.1)
SODIUM: 138 mmol/L (ref 136–145)

## 2011-01-05 LAB — CBC/DIFF
BASOPHILS: 1 % (ref 0–1)
BASOS ABS: 0.059 THOU/uL (ref 0.0–0.2)
EOS ABS: 0.408 THOU/uL — ABNORMAL HIGH (ref 0.1–0.3)
EOSINOPHIL: 4 % (ref 1–6)
HCT: 30.1 % — ABNORMAL LOW (ref 33.5–45.2)
HGB: 10.3 g/dL — ABNORMAL LOW (ref 11.5–15.2)
LYMPHOCYTES: 27 % (ref 20–45)
LYMPHS ABS: 2.81 THOU/uL (ref 1.0–4.8)
MCH: 30.2 pg (ref 27.4–33.0)
MCHC: 34.3 g/dL (ref 31.6–35.5)
MCV: 88.1 fL (ref 82.0–99.0)
MONOCYTES: 10 % (ref 4–13)
MONOS ABS: 1.07 THOU/uL — ABNORMAL HIGH (ref 0.1–0.9)
MPV: 7.9 FL (ref 7.4–10.4)
NRBC'S: 0 /100{WBCs}
PLATELET COUNT: 162 THOU/uL (ref 140–450)
PMN ABS: 6.17 THOU/uL (ref 1.5–7.7)
PMN'S: 58 % (ref 40–75)
RBC: 3.42 MIL/uL — ABNORMAL LOW (ref 3.84–5.04)
RDW: 13 % (ref 10.2–14.0)
WBC: 10.5 10*3/uL (ref 3.5–11.0)

## 2011-01-05 LAB — PHOSPHORUS: PHOSPHORUS: 2.2 mg/dL — ABNORMAL LOW (ref 2.4–4.7)

## 2011-01-05 LAB — PERFORM POC WHOLE BLOOD GLUCOSE
GLUCOSE, POINT OF CARE: 96 mg/dL (ref 70–105)
GLUCOSE, POINT OF CARE: 218 mg/dL — ABNORMAL HIGH (ref 70–105)
GLUCOSE, POINT OF CARE: 58 mg/dL — ABNORMAL LOW (ref 70–105)
GLUCOSE, POINT OF CARE: 79 mg/dL (ref 70–105)
GLUCOSE, POINT OF CARE: 204 mg/dL — ABNORMAL HIGH (ref 70–105)

## 2011-01-05 LAB — MAGNESIUM: MAGNESIUM: 1.9 mg/dL (ref 1.7–2.5)

## 2011-01-05 MED ORDER — HYDROCODONE 5 MG-ACETAMINOPHEN 325 MG TABLET
1.00 | ORAL_TABLET | ORAL | Status: DC | PRN
Start: 2011-01-05 — End: 2011-01-05

## 2011-01-05 MED ORDER — SODIUM CHLORIDE 0.45 % INTRAVENOUS SOLUTION
INTRAVENOUS | Status: DC
Start: 2011-01-05 — End: 2011-01-07

## 2011-01-05 MED ORDER — OXYBUTYNIN CHLORIDE 5 MG TABLET
5.00 mg | ORAL_TABLET | Freq: Three times a day (TID) | ORAL | Status: DC
Start: 2011-01-05 — End: 2011-01-05

## 2011-01-05 MED ORDER — SULFAMETHOXAZOLE 800 MG-TRIMETHOPRIM 160 MG TABLET
1.00 | ORAL_TABLET | Freq: Two times a day (BID) | ORAL | Status: DC
Start: 2011-01-05 — End: 2011-01-05

## 2011-01-05 NOTE — OR Surgeon (Deleted)
[facility]   DEPARTMENT OF UROLOGY   OPERATION SUMMARY     PATIENT NAME: Christy Hale  HOSPITAL NUMBER: [id number]   DATE OF SERVICE: 01/05/2011   DATE OF BIRTH: 20-Oct-1941     PREOPERATIVE DIAGNOSIS: Left ureterolithiasis  POSTOPERATIVE DIAGNOSIS: same    NAME OF PROCEDURE: 1. Rigid cystourethroscopy, 2. 6Fr x 24cm contour double J left stent insertion  ANESTHESIA: General endotracheal anesthesia.   SURGEON(S): Dr. Orlene Plum   RESIDENT(S): [place] P. Jacquetta Polhamus, MD  ESTIMATED BLOOD LOSS: None.  FLUIDS:  Per anesthesia.  COMPLICATIONS:  None.  TUBES AND DRAINS:  none  IMPLANTS:  6Fr x 24cm contour double J left stent     INDICATIONS FOR PROCEDURE:  [name] is a 70 y.o. female with 15mm left UPJ stone. Normal WBC and normal Cr. Presents for left ureteral stent insertion.    FINDINGS:  1.  Bilateral UOs in normal anatomic position.  2.  No masses, lesions or stones in the bladder.  3.  Appropriate placement 6Fr x 24cm contour double J left stent     DESCRIPTION OF PROCEDURE:  The patient was consented preoperatively, and all risks and benefits of the procedure were explained.  Patient was then brought back to operating room and set up for the procedure.  The patient was placed on the operating table.  General endotracheal anesthesia was administered.  The patient was placed in the dorsal lithotomy position. Genitals were prepped and draped in the usual sterile fashion.  Using a 22-French sheath and a 30-degree lens, we inserted a scope into the urethra and into the bladder under direct visualization.  Once inside the bladder, panendoscopic views of the bladder were obtained, including the anterior and lateral walls, floor, trigone and dome.  Bilateral UOs were noted to be in normal anatomic position.  No masses, lesions or stones were noted in the bladder.  Our attention was drawn towards the left ureteral orifice, and a 0.035 straight tip Sensor wire was advanced up to left renal pelvis as demonstrated by fluoroscopy.  Next a 6Fr x 24cm contour double J left stent was inserted through the scope and over the wire. It was advanced to the left renal pelvis with aid of a stent pusher. Appropriate placement was confirmed with intraoperative fluoroscopy and the wire was removed. A distal coil was observed in the bladder. Appropriate placement was thus confirmed and the scope was broken down and the procedure was terminated.  The patient tolerated the procedure well, and there were no complications.    Dr. Orlene Plum was present and was available for assistance and participation throughout the entire procedure.    DISPOSITION:  The patient will be discharged home with 5 days antibiotics, pain medication, and ditropan. We will plan to set patient up for ESWL but this may not be possible due to patient's body habitus. We will plan to place patient on the ESWL table and determine if fluorscopy can visualize the stone.    [place] P Aundria Bitterman, MD 01/05/2011, 3:14 PM     [place] P. Merlen Gurry, M.D.  Resident, [facility]  Division of Urology

## 2011-01-05 NOTE — Nurses Notes (Signed)
 Notified service of K+ 3.4 and Phos 2.2. Waiting for orders. Will continue to monitor.

## 2011-01-05 NOTE — Progress Notes (Addendum)
[facility]                                                  UROLOGY  PROGRESS NOTE    Christy Hale, Christy Hale, 70 y.o. female  Date of Admission:  12/31/2010  Date of Birth:  1941-03-16  Date of Service:  01/05/2011    Post Op Day: 5 Days Post-Op S/P Procedure(s) (LRB):  CYSTECTOMY RADICAL (N/A)  PLACEMENT CONDUIT URINARY (N/A)    Subjective: No acute urologic events,currently SDU status    Vital Signs:  Temp (24hrs) Max:37 C (98.6 F)      Temperature: 36.6 C (97.9 F) (01/05/11 0748)  BP (Non-Invasive): 169/74 mmHg (01/05/11 0748)  MAP (Non-Invasive): 106 mmHG (01/05/11 0748)  Heart Rate: 52  (01/05/11 0748)  Respiratory Rate: 20  (01/05/11 0748)  Pain Score: 6 (01/05/11 0838)  SpO2-1: 95 % (01/05/11 0746)    Current Medications:    Current facility-administered medications:  heparin 5,000 unit/mL injection 5,000 Units 5,000 Units Subcutaneous Q8HRS   D5W 1/2 NS premix infusion   Intravenous Continuous   verapamil extended release tablet  240 mg Oral Daily with Breakfast   HYDROcodone-acetaminophen (NORCO) 5-325 mg per tablet  1 Tab Oral Q4H PRN   sennosides-docusate sodium (SENOKOT-S) 8.6-50mg  per tablet  1 Tab Oral 2x/day   docusate sodium (COLACE) capsule  100 mg Oral 2x/day   insulin glargine (LANTUS) 100 units/mL injection  30 Units Subcutaneous NIGHTLY   citalopram (CELEXA) tablet  40 mg Gastric (NG, OG, PEG, GT) Daily   esomeprazole (NEXIUM) capsule  40 mg Gastric (NG, OG, PEG, GT) QAM   acetaminophen (TYLENOL) tablet  650 mg Gastric (NG, OG, PEG, GT) Q6H PRN   prenatal vitamin-iron fumarate-folic acid  (NATALCARE PLUS) 27-0.8mg  per tablet  1 Tab Gastric (NG, OG, PEG, GT) Daily   simvastatin (ZOCOR) tablet  40 mg Gastric (NG, OG, PEG, GT) QPM   sodium phosphate 15 mmol in D5W 100 mL IVPB 15 mmol Intravenous Once   SSIP insulin R human (HUMULIN R) 100 units/mL injection  4-12 Units Subcutaneous 4x/day PRN    ondansetron (ZOFRAN) 2 mg/mL injection  4 mg Intravenous Q6H PRN   NS 10 mL injection  2 mL Intracatheter Q8HRS   NS 10 mL injection  2-6 mL Intracatheter Q1 MIN PRN   labetalol (TRANDATE) 5 mg/mL injection  5 mg Intravenous Q2H PRN   hydrALAZINE (APRESOLINE) injection 10 mg 10 mg Intravenous Q4H PRN         Today's Physical Exam:  Constitutional - NAD  Eyes - Clear  Neuro - alert  CV - RR  Pulm - CTA B/L  GI - Soft, stoma is pink/viable, incision c/d/i  Skin - Warm and Dry  GU - pelvic drain in place with serosanguinous drainage      I/O:   Intake/Output Summary (Last 24 hours) at 01/05/11 1101  Last data filed at 01/05/11 0700   Gross per 24 hour   Intake   1140 ml   Output   3270 ml   Net  -2130 ml       I/O current shift:       Prophylaxis:  Date Started Date Completed   DVT/PE SCDs/ Venodynes     GI         Nutrition/Residuals:  Full Liquid  Labs  Please indicate ordered or reviewed)  Reviewed: BMP:     Recent Labs   [facility] 01/05/11 0050   . SODIUM 138   . POTASSIUM 3.4*   . CHLORIDE 104   . CO2 28   . BUN 8   . CREATININE 0.82   . GLUCOSENF 150*   . GLUCOSEFAST --   . ANIONGAP 6   . BUNCRRATIO 10   . GFR >59   . CALCIUM 8.4*       CBC Results Differential Results   Recent Labs   [facility] 01/05/11 0050   . WBC 10.5   . HGB 10.3*   . HCT 30.1*   . PLTCNT 162      Recent Results (from the past 30 hour(s))   CBC/DIFF    Collection Time    01/05/11 12:50 AM   Component Value Comment   . WBC 10.5    . PMN'S 58    . LYMPHOCYTES 27    . MONOCYTES 10    . EOSINOPHIL 4    . BASOPHILS 1    . NRBC'S 0             Assessment/ Plan:   Active Hospital Problems   (*Primary Problem)   Diagnoses   . *Bladder cancer   . Hypocalcemia   . Metabolic acidosis   . Hypomagnesemia   . Hx of total cystectomy   . Hx of unilateral nephrectomy   . Respiratory failure following trauma and surgery   . AKI (acute kidney injury)   . Type 2 diabetes mellitus     dx 20 + yrs ago, does not check fs at home     . Hyperlipidemia        S/p Open radical cystectomy with closure of vaginal cuff and urethrectomy    UOP increased, creatinine improved  Advance to full liquids  OOB   Ambulating  Will discuss central line and foley drain  Continue to monitor    [place] P Hubsher, MD 01/05/2011, 11:01 AM    I saw and examined the patient.  I reviewed the resident's note.  I agree with the findings and plan of care as documented in the resident's note.  Any exceptions/additions are edited/noted.    Earnest Rosier, MD 01/05/2011, 9:37 PM

## 2011-01-05 NOTE — Nurses Notes (Signed)
Patient's finger stick was 204 at this time.  Previously patient had been 58 and patient was given several glasses of juice.  Patient finished grape juice 10 minutes before finger stick.  Patient refuses insulin coverage at this time.  Will reassess fingerstick in a couple hours.

## 2011-01-05 NOTE — Nurses Notes (Signed)
Pt has been out of bed and to the chair from 8a-2p.  Pt ambulated with a steady gait around the unit x1.

## 2011-01-06 LAB — CBC/DIFF
BASOPHILS: 1 % (ref 0–1)
BASOS ABS: 0.067 THOU/uL (ref 0.0–0.2)
EOS ABS: 0.348 THOU/uL — ABNORMAL HIGH (ref 0.1–0.3)
EOSINOPHIL: 3 % (ref 1–6)
HCT: 31.3 % — ABNORMAL LOW (ref 33.5–45.2)
HGB: 10.6 g/dL — ABNORMAL LOW (ref 11.5–15.2)
LYMPHOCYTES: 24 % (ref 20–45)
LYMPHS ABS: 3.02 THOU/uL (ref 1.0–4.8)
MCH: 29.8 pg (ref 27.4–33.0)
MCHC: 34 g/dL (ref 31.6–35.5)
MCV: 87.7 fL (ref 82.0–99.0)
MONOCYTES: 11 % (ref 4–13)
MONOS ABS: 1.37 THOU/uL — ABNORMAL HIGH (ref 0.1–0.9)
MPV: 7.5 FL (ref 7.4–10.4)
NRBC'S: 0 /100{WBCs}
PLATELET COUNT: 213 10*3/uL (ref 140–450)
PMN ABS: 7.88 THOU/uL — ABNORMAL HIGH (ref 1.5–7.7)
PMN'S: 61 % (ref 40–75)
RBC: 3.57 MIL/uL — ABNORMAL LOW (ref 3.84–5.04)
RDW: 13.1 % (ref 10.2–14.0)
WBC: 12.7 THOU/uL — ABNORMAL HIGH (ref 3.5–11.0)

## 2011-01-06 LAB — BASIC METABOLIC PANEL
ANION GAP: 8 mmol/L (ref 5–16)
BUN/CREAT RATIO: 9 (ref 6–22)
BUN: 6 mg/dL (ref 6–20)
CALCIUM: 8.2 mg/dL — ABNORMAL LOW (ref 8.5–10.4)
CARBON DIOXIDE: 26 mmol/L (ref 22–32)
CHLORIDE: 105 mmol/L (ref 96–111)
CREATININE: 0.68 mg/dL (ref 0.49–1.10)
ESTIMATED GLOMERULAR FILTRATION RATE: >59 ml/min/1.73m2 (ref >59)
GLUCOSE,NONFAST: 52 mg/dL — ABNORMAL LOW (ref 65–139)
POTASSIUM: 3.2 mmol/L — ABNORMAL LOW (ref 3.5–5.1)
SODIUM: 139 mmol/L (ref 136–145)

## 2011-01-06 LAB — PERFORM POC WHOLE BLOOD GLUCOSE
GLUCOSE, POINT OF CARE: 62 mg/dL — ABNORMAL LOW (ref 70–105)
GLUCOSE, POINT OF CARE: 81 mg/dL (ref 70–105)
GLUCOSE, POINT OF CARE: 101 mg/dL (ref 70–105)
GLUCOSE, POINT OF CARE: 204 mg/dL — ABNORMAL HIGH (ref 70–105)
GLUCOSE, POINT OF CARE: 147 mg/dL — ABNORMAL HIGH (ref 70–105)

## 2011-01-06 LAB — MAGNESIUM: MAGNESIUM: 1.8 mg/dL (ref 1.7–2.5)

## 2011-01-06 LAB — CREATININE BODY FLUID: CREATININE, FLUID: 0.8 mg/dL

## 2011-01-06 LAB — HISTORICAL SURGICAL PATHOLOGY SPECIMEN

## 2011-01-06 LAB — PHOSPHORUS: PHOSPHORUS: 2.8 mg/dL (ref 2.4–4.7)

## 2011-01-06 MED ORDER — POTASSIUM CHLORIDE ER 20 MEQ TABLET,EXTENDED RELEASE(PART/CRYST)
60.0000 meq | ORAL_TABLET | Freq: Once | ORAL | Status: AC
Start: 2011-01-06 — End: 2011-01-06
  Administered 2011-01-06: 60 meq via ORAL
  Filled 2011-01-06: qty 3

## 2011-01-06 MED ORDER — DIPHENHYDRAMINE 12.5 MG/5 ML ORAL ELIXIR
12.5000 mg | ORAL_SOLUTION | Freq: Once | ORAL | Status: DC
Start: 2011-01-07 — End: 2011-01-07
  Administered 2011-01-07: 0 mg via ORAL
  Filled 2011-01-06: qty 5

## 2011-01-06 NOTE — Nurses Notes (Signed)
Spoke with Dr Leane Para with urology, about patient BP being up/no tele ordered at this time.  New order obtained for Tele.  Will continue to assess.

## 2011-01-06 NOTE — Care Management Notes (Signed)
Care Coordinator/Social Work Plan  [place]  Patient Name: SUKARI GRIST MRN: [id number] Acct Number: [id number]  DOB: 11/25/1940 Age: 70  **Admission Information**  Patient Type: INPATIENT  Admit Date: 12/31/2010 Admit Time: 04:52  Admit Reason: BLADDER CANCER  Admitting Phys: Caryl Ada  Attending Phys: Caryl Ada  Unit: 8W Bed: 826-A  130. CM Assessment Notes  Created by : Glendora Score Date/Time 2011-01-06 15:40:01.307  Assessment Notes  Note:  Note: Per Laurelyn Sickle with [facility] pt. is accepted for admission tomorrow and facility is aware pt. will leave at appx: 10  am. MSW updated MD Hubsner who is in agreement with pt.'s transition plan. MSW updated pt. at bedside who is in  agreement and signed Medicare IMM letter. Pt.'s bedside RN Maxine Glenn is aware. MSW updated pt.'s AVS.   Consult and Reassessment Will continue to re-assess for changes in plan of care or discharge needs

## 2011-01-06 NOTE — Care Management Notes (Signed)
Care Coordinator/Social Work Plan  [place]  Patient Name: Christy Hale MRN: [id number] Acct Number: [id number]  DOB: 1941/06/11 Age: 70  **Admission Information**  Patient Type: INPATIENT  Admit Date: 12/31/2010 Admit Time: 04:52  Admit Reason: BLADDER CANCER  Admitting Phys: Caryl Ada  Attending Phys: Caryl Ada  Unit: 8W Bed: 826-A  130. CM Assessment Notes  Created by : Judeth Cornfield Date/Time 2011-01-06 16:22:40.747  Assessment Notes  Note:  Note: HD # 6. Clinical; Discussed ileul conduit/ostomy supplies with Katina at [employer]. Contacted BSN Monica and  requested that patient have at least two hollister two piece systems sent with patient for changes in case SNF unable to  obtain product tomorrow afternoon. Monica BSN states she will order extra for patient from central supply. Discharge  plan: Anticipate patient to discharge to [facility] SNF-possibly tomorrow if medically stable.   Consult and Reassessment Will continue to re-assess for changes in plan of care or discharge needs

## 2011-01-06 NOTE — Care Management Notes (Signed)
Care Coordinator/Social Work Plan  [place]  Patient Name: Christy Hale MRN: [id number] Acct Number: [id number]  DOB: Jan 16, 1941 Age: 70  **Admission Information**  Patient Type: INPATIENT  Admit Date: 12/31/2010 Admit Time: 04:52  Admit Reason: BLADDER CANCER  Admitting Phys: Caryl Ada  Attending Phys: Caryl Ada  Unit: 8W Bed: 826-A  130. CM Assessment Notes  Created by : Glendora Score Date/Time 2011-01-06 13:36:30.593  Assessment Notes  Note:  Note: Pt. discussed with Laurelyn Sickle with [facility] via phone with the assistance of [facility] facility's questions have  been answered. Laurelyn Sickle will return MSW"s call with update   Consult and Reassessment Will continue to re-assess for changes in plan of care or discharge needs

## 2011-01-06 NOTE — Nurses Notes (Signed)
01/06/11 0803   Vital Signs   Temp 36.6 C (97.9 F)   Temp Source Oral   Pulse 75    Resp 20    BP ! 145/57 mmHg   MAP (Non-Invasive) 91 mmHG   BP Source (Non-Invasive) Monitor   Patient Position Supine     10 mg hydralazine administered IVP per prn order. Will monitor.

## 2011-01-06 NOTE — Nurses Notes (Signed)
Report given to 8W RN, Darby. Patient transferred to room 826 without telemetry.

## 2011-01-06 NOTE — Nurses Notes (Signed)
Patient here from 8NE tower.  Assessment per data base.  Will continue to assess.

## 2011-01-06 NOTE — Progress Notes (Addendum)
[facility]                                                  UROLOGY  PROGRESS NOTE    Christy Hale, Christy Hale, 70 y.o. female  Date of Admission:  12/31/2010  Date of Birth:  1941-04-28  Date of Service:  01/06/2011    Post Op Day: 6 Days Post-Op S/P Procedure(s) (LRB):  CYSTECTOMY RADICAL (N/A)  PLACEMENT CONDUIT URINARY (N/A)    Subjective: No acute urologic events,currently SDU status    Vital Signs:  Temp (24hrs) Max:37.2 C (99 F)      Temperature: 37.1 C (98.8 F) (01/06/11 0308)  BP (Non-Invasive): 141/65 mmHg (01/06/11 0330)  MAP (Non-Invasive): 93 mmHG (01/06/11 0330)  Heart Rate: 62  (01/06/11 0330)  Respiratory Rate: 20  (01/06/11 0308)  Pain Score: 0 (01/06/11 0330)  SpO2-1: 92 % (01/06/11 0308)    Current Medications:    Current facility-administered medications:  1/2 NS 1,000 mL  Intravenous Continuous   heparin 5,000 unit/mL injection 5,000 Units 5,000 Units Subcutaneous Q8HRS   verapamil extended release tablet  240 mg Oral Daily with Breakfast   HYDROcodone-acetaminophen (NORCO) 5-325 mg per tablet  1 Tab Oral Q4H PRN   sennosides-docusate sodium (SENOKOT-S) 8.6-50mg  per tablet  1 Tab Oral 2x/day   docusate sodium (COLACE) capsule  100 mg Oral 2x/day   insulin glargine (LANTUS) 100 units/mL injection  30 Units Subcutaneous NIGHTLY   citalopram (CELEXA) tablet  40 mg Gastric (NG, OG, PEG, GT) Daily   esomeprazole (NEXIUM) capsule  40 mg Gastric (NG, OG, PEG, GT) QAM   acetaminophen (TYLENOL) tablet  650 mg Gastric (NG, OG, PEG, GT) Q6H PRN   prenatal vitamin-iron fumarate-folic acid  (NATALCARE PLUS) 27-0.8mg  per tablet  1 Tab Gastric (NG, OG, PEG, GT) Daily   simvastatin (ZOCOR) tablet  40 mg Gastric (NG, OG, PEG, GT) QPM   sodium phosphate 15 mmol in D5W 100 mL IVPB 15 mmol Intravenous Once   SSIP insulin R human (HUMULIN R) 100 units/mL injection  4-12 Units Subcutaneous 4x/day PRN    ondansetron (ZOFRAN) 2 mg/mL injection  4 mg Intravenous Q6H PRN   NS 10 mL injection  2 mL Intracatheter Q8HRS   NS 10 mL injection  2-6 mL Intracatheter Q1 MIN PRN   labetalol (TRANDATE) 5 mg/mL injection  5 mg Intravenous Q2H PRN   hydrALAZINE (APRESOLINE) injection 10 mg 10 mg Intravenous Q4H PRN         Today's Physical Exam:  Constitutional - NAD  Eyes - Clear  Neuro - alert  CV - RR  Pulm - CTA B/L  GI - Soft, stoma is pink/viable, incision c/d/i  Skin - Warm and Dry  GU - pelvic drain in place with serosanguinous drainage      I/O:   Intake/Output Summary (Last 24 hours) at 01/06/11 0704  Last data filed at 01/06/11 0600   Gross per 24 hour   Intake   2140 ml   Output   2250 ml   Net   -110 ml       I/O current shift:  03/19 0000 - 03/19 0759  In: 300 [I.V.:300]  Out: 700 [Urine:600; Drains:100]    Prophylaxis:  Date Started Date Completed   DVT/PE SCDs/ Venodynes     GI  Nutrition/Residuals:  Full Liquid       Labs  Please indicate ordered or reviewed)  Reviewed: BMP:     Recent Labs   Basename 01/06/11 0330   . SODIUM 139   . POTASSIUM 3.2*   . CHLORIDE 105   . CO2 26   . BUN 6   . CREATININE 0.68   . GLUCOSENF 52*   . GLUCOSEFAST --   . ANIONGAP 8   . BUNCRRATIO 9   . GFR >59   . CALCIUM 8.2*       CBC Results Differential Results   Recent Labs   [facility] 01/06/11 0330   . WBC 12.7*   . HGB 10.6*   . HCT 31.3*   . PLTCNT 213      Recent Results (from the past 30 hour(s))   CBC/DIFF    Collection Time    01/06/11  3:30 AM   Component Value Comment   . WBC 12.7    . PMN'S 61    . LYMPHOCYTES 24    . MONOCYTES 11    . EOSINOPHIL 3    . BASOPHILS 1    . NRBC'S 0             Assessment/ Plan:   Active Hospital Problems   (*Primary Problem)   Diagnoses   . *Bladder cancer   . Hypocalcemia   . Metabolic acidosis   . Hypomagnesemia   . Hx of total cystectomy   . Hx of unilateral nephrectomy   . Respiratory failure following trauma and surgery   . AKI (acute kidney injury)   . Type 2 diabetes mellitus      dx 20 + yrs ago, does not check fs at home     . Hyperlipidemia       S/p Open radical cystectomy with closure of vaginal cuff and urethrectomy    UOP increased, creatinine improved  OOB   Ambulating  Mild hypokalemia - will replace  Transfer to floor  Will discuss central line and foley drain  Continue to monitor    [place] P Hubsher, MD 01/06/2011, 7:04 AM      I saw and examined the patient.  I reviewed the resident's note.  I agree with the findings and plan of care as documented in the resident's note.  Any exceptions/additions are edited/noted.    Caryl Ada, MD 01/06/2011, 4:40 PM

## 2011-01-06 NOTE — Ancillary Notes (Signed)
[facility]   Met with [name] to assess diabetes education needs.  She states that she has had diabetes for past 20 years.  She takes Lantus and Humalog at home.  She follows with Jodie Echevaria, MD   Reviewed signs, symptoms and treatment for hypoglycemia.  General diabetes issues discussed.    Provided patient with contact information.   Katherine Roan RD, LD, CDE  [facility]  Pager #: [phone]

## 2011-01-06 NOTE — Care Management Notes (Signed)
Care Coordinator/Social Work Plan  [place]  Patient Name: Christy Hale MRN: [id number] Acct Number: [id number]  DOB: 1941-07-01 Age: 70  **Admission Information**  Patient Type: INPATIENT  Admit Date: 12/31/2010 Admit Time: 04:52  Admit Reason: BLADDER CANCER  Admitting Phys: Caryl Ada  Attending Phys: Caryl Ada  Unit: 8W Bed: 826-A  160. CMS Important Message / Detailed Notice  Created by : Glendora Score Date/Time 2011-01-06 15:40:18.237  Medicare Important Message/Detailed Notice  Discharge- CMS-Important Message from Medicare About Your Rights (CMS-1405) IMM was delivered to the patient. A copy was  provided to the patient. (Date / Time)

## 2011-01-06 NOTE — Discharge Instructions (Signed)
Discharge Recommendations/ Plan:Discharge to:Skilled Nursing Facility-Medicare Certified      Resources: [facility] phone [phone] and fax  [phone]

## 2011-01-06 NOTE — Care Management Notes (Signed)
 Care Coordinator/Social Work Plan  [place]  Patient Name: Christy Hale MRN: [id number] Acct Number: [id number]  DOB: 05-16-1941 Age: 70  **Admission Information**  Patient Type: INPATIENT  Admit Date: 12/31/2010 Admit Time: 04:52  Admit Reason: BLADDER CANCER  Admitting Phys: ARMAND RODERIC ORN  Attending Phys: ARMAND RODERIC ORN  Unit: 8NE Bed: 8NE-09  130. CM Assessment Notes  Created by : Comer Border Date/Time 2011-01-06 11:35:33.580  Assessment Notes  Note:  Note: Per MD Hubsher pt. will be medically stable for transition to a skilled nursing facility tomorrow. MSW met with  pt. who is in agreement with transition to [facility] tomorrow if facility can accept. Pt. signed [facility] PAS as well as  Patient Choice for d/c Arrangements form. MSW contacted Cassius with [facility] and left a message requesting update  regarding referral status. MSW tasked the resource center to fax pt.'s signed approved PAS to [facility]. MSW updated  [facility].   Consult and Reassessment Will continue to re-assess for changes in plan of care or discharge needs

## 2011-01-06 NOTE — Care Management Notes (Signed)
Referral Information  ++++++ Placed Provider #1 ++++++  Case Manager: Renezmae Canlas  Provider Type: Transportation  Provider Name: [facility]  Address:  [address]  [place], [place] [address]  Contact:    Fax:   Fax:

## 2011-01-07 LAB — CBC/DIFF
BASOPHILS: 0 % (ref 0–1)
BASOS ABS: 0.047 THOU/uL (ref 0.0–0.2)
EOS ABS: 0.339 THOU/uL — ABNORMAL HIGH (ref 0.1–0.3)
EOSINOPHIL: 2 % (ref 1–6)
HCT: 32 % — ABNORMAL LOW (ref 33.5–45.2)
HGB: 10.3 g/dL — ABNORMAL LOW (ref 11.5–15.2)
LYMPHOCYTES: 21 % (ref 20–45)
LYMPHS ABS: 2.82 THOU/uL (ref 1.0–4.8)
MCH: 28.3 pg (ref 27.4–33.0)
MCHC: 32.1 g/dL (ref 31.6–35.5)
MCV: 88 fL (ref 82.0–99.0)
MONOCYTES: 10 % (ref 4–13)
MONOS ABS: 1.38 THOU/uL — ABNORMAL HIGH (ref 0.1–0.9)
MPV: 7.2 FL — ABNORMAL LOW (ref 7.4–10.4)
NRBC'S: 0 /100{WBCs}
PLATELET COUNT: 263 THOU/uL (ref 140–450)
PMN ABS: 9.11 THOU/uL — ABNORMAL HIGH (ref 1.5–7.7)
PMN'S: 67 % (ref 40–75)
RBC: 3.64 MIL/uL — ABNORMAL LOW (ref 3.84–5.04)
RDW: 13.3 % (ref 10.2–14.0)
WBC: 13.7 THOU/uL — ABNORMAL HIGH (ref 3.5–11.0)

## 2011-01-07 LAB — BASIC METABOLIC PANEL
ANION GAP: 7 mmol/L (ref 5–16)
BUN/CREAT RATIO: 8 (ref 6–22)
BUN: 7 mg/dL (ref 6–20)
CALCIUM: 8.3 mg/dL — ABNORMAL LOW (ref 8.5–10.4)
CARBON DIOXIDE: 25 mmol/L (ref 22–32)
CHLORIDE: 105 mmol/L (ref 96–111)
CREATININE: 0.84 mg/dL (ref 0.49–1.10)
ESTIMATED GLOMERULAR FILTRATION RATE: >59 ml/min/1.73m2 (ref >59)
GLUCOSE,NONFAST: 61 mg/dL — ABNORMAL LOW (ref 65–139)
POTASSIUM: 3.6 mmol/L (ref 3.5–5.1)

## 2011-01-07 LAB — ADULT ROUTINE BLOOD CULTURE, SET OF 2 BOTTLES (BACTERIA AND YEAST)
CULTURE OBSERVATION: NO GROWTH
CULTURE OBSERVATION: NO GROWTH
REPORT STATUS: 3202012
REPORT STATUS: 3202012
SPECIAL REQUESTS: 1
SPECIAL REQUESTS: 1

## 2011-01-07 LAB — MAGNESIUM: MAGNESIUM: 1.8 mg/dL (ref 1.7–2.5)

## 2011-01-07 LAB — PHOSPHORUS: PHOSPHORUS: 2.8 mg/dL (ref 2.4–4.7)

## 2011-01-07 LAB — PERFORM POC WHOLE BLOOD GLUCOSE: GLUCOSE, POINT OF CARE: 70 mg/dL (ref 70–105)

## 2011-01-07 MED ORDER — SENNOSIDES 8.6 MG-DOCUSATE SODIUM 50 MG TABLET
1.00 | ORAL_TABLET | Freq: Every evening | ORAL | Status: AC
Start: 2011-01-07 — End: ?

## 2011-01-07 MED ORDER — DIPHENHYDRAMINE 12.5 MG/5 ML ORAL LIQUID
12.5000 mg | Freq: Once | ORAL | Status: AC
Start: 2011-01-07 — End: 2011-01-07
  Administered 2011-01-07: 12.5 mg via ORAL
  Filled 2011-01-07: qty 5

## 2011-01-07 MED ORDER — HYDROCODONE 5 MG-ACETAMINOPHEN 325 MG TABLET
1.00 | ORAL_TABLET | ORAL | Status: DC | PRN
Start: 2011-01-07 — End: 2012-05-07

## 2011-01-07 NOTE — Nurses Notes (Signed)
BP 175/74, HR 70.  Per PRN order, administered 10mg  Hydralazine IVP at this time.  Will recheck BP.  Will monitor.

## 2011-01-07 NOTE — Nurses Notes (Signed)
Reviewed discharge instructions with pt.  Prescriptions faxed to [facility] on the [address].  Report called to nurse, Victorino Dike.  Pt transported by ambulance.

## 2011-01-07 NOTE — Nurses Notes (Signed)
Per order, foley catheter removed at this time.  Pt tolerated well.  Drainage noted.  Advised pt to call out when she feels the need to use the BR.  Will monitor.

## 2011-01-07 NOTE — Discharge Summary (Signed)
[facility]  DISCHARGE SUMMARY      PATIENT NAME:  Christy Hale, Christy Hale  MRN:  [id number]  DOB:  1941-08-03    ADMISSION DATE:  12/31/2010  DISCHARGE DATE:  01/07/2011    ATTENDING PHYSICIAN: Caryl Ada, MD  PRIMARY CARE PHYSICIAN: Jodie Echevaria, MD     ADMISSION DIAGNOSIS: Bladder cancer  DISCHARGE DIAGNOSIS:   No resolved problems to display.    Active Hospital Problems   Diagnoses Date Noted    . Principle Problem: Bladder cancer 12/20/2010    . Hypocalcemia 01/03/2011    . Metabolic acidosis 12/31/2010    . Hypomagnesemia 12/31/2010    . Hx of total cystectomy 12/31/2010    . Hx of unilateral nephrectomy 12/31/2010    . Respiratory failure following trauma and surgery 12/31/2010    . AKI (acute kidney injury) 12/31/2010    . Type 2 diabetes mellitus     . Hyperlipidemia        Resolved Hospital Problems   Diagnoses       There are no active non-hospital problems to display for this patient.     DISCHARGE MEDICATIONS:  Current Discharge Medication List      START taking these medications       hydrocodone-Acetaminophen (NORCO) 5-325 mg Oral Tablet tablet    take 1 Tab by mouth Every 4 hours as needed for Pain.    Qty: 35 Tab Refills: 1        sennosides-docusate sodium (SENOKOT-S) 8.6-50 mg Oral Tablet    take 1 Tab by mouth QPM.    Qty: 30 Tab Refills: 0          CONTINUE these medications which have NOT CHANGED       Chromium Picolinate 500 mcg Oral Capsule    take 1 Tab by mouth Twice daily.        INSULIN LISPRO (HUMALOG SUBQ)    8 Units by Subcutaneous route Twice daily.        enalapril (VASOTEC) 2.5 mg Oral Tablet    take 2.5 mg by mouth Once a day.        NIACIN, INOSITOL NIACINATE, ORAL    take 500 mg by mouth Once a day.        verapamil 240 mg Oral Tablet Sustained Release    take 240 mg by mouth Once a day.        citalopram (CELEXA) 40 mg Oral Tablet    take 40 mg by mouth Once a day.         Hydrocodone-Acetaminophen (LORTAB) 2.5-500 mg Oral Tablet tablet    take 1 Tab by mouth Every 6 hours as needed.        INSULIN GLARGINE,HUM.REC.ANLOG (LANTUS SUBQ)    34 Units by Subcutaneous route Once a day.          STOP taking these medications       Simvastatin (ZOCOR) 40 mg Oral Tablet    Comments:     Reason for Stopping:         Omeprazole (PRILOSEC) 40 mg Oral Capsule, Delayed Release(E.C.)    Comments:     Reason for Stopping:         sucralfate (CARAFATE) 1 gram Oral Tablet    Comments:     Reason for Stopping:         potassium chloride (KLOR-CON) 20 mEq Oral Packet    Comments:     Reason for Stopping:  OXYBUTYNIN CHLORIDE (DITROPAN XL ORAL)    Comments:     Reason for Stopping:             DISCHARGE INSTRUCTIONS:     SCHEDULE FOLLOW-UP PHYSICIANS OFFICE [facility]ENTER   Follow-up appointment clinic: [facility] - UROLOGY    Follow-up in: 2 WEEKS    Reason for visit: HOSPITAL DISCHARGE    Followup reason: s/p cystectomy    Provider: Salkini      DISCHARGE INSTRUCTION - MISC   Patient may resume all home medications. No strenuous activity. Do not submerge incision in water. No lifting more than 5 lbs until cleared by Dr. Marzetta Board. Do not drive while on pain medication. Call the office at [phone] if you have any questions. Go to the emergency room if develop increased pain, fever, or if overall generall condition worsens. Expect to have substantial leakage from vagina -- this should decrease in time. We will re-evaluate the leakage when you return to our clinic.     ADULT DIAPERS (REQUISITION/REQUEST)   Ht 157.5 cm    Wt 91.8 kg         REASON FOR HOSPITALIZATION AND HOSPITAL COURSE:  This is a 70 y.o., female with a history of TCC of the left renal pelvis, s/p left nephroureterectomy four years ago. She was recently found to have a bladder mass on imaging and subsequently underwent a TURBT. She was found to have high-grade urothelial carcinoma. A long discussion was held with the patient and her family regarding her plan of care. She has elected to undergo radical cystectomy with ileal conduit diversion. She presented to the hospital on 12/31/10 to undergo operative intervention. Patient tolerated the procedure well and there were no complications. Following the procedure, patient remained intubated and was placed in the SICU. On POD 1 patient was extuabted. On POD 2 patient's renal function was noted to gradually worsen. Nephrology was consulted and a FENA demonstrated an obstructive etiology. On 01/02/11 a bedside looposcopy with ureteral stent exchange was performed and patient's creatine subsequently normalized. On 01/03/11 she was transferred to Step-down. Her NG tube was removed. On 01/04/11 patient was out of bed. She began to tolerate oral intake on 01/05/11. On 01/07/11 Patient was tolerating oral intake, pain was controlled with oral medication, and the patient was out of bed. At this point patient was deemed suitable for home discharge. Patient will be discharged to SNF with discharge instructions, medications, and follow-up as stated above. Patient understands the plan and is in agreement.      DISCHARGE DISPOSITION:  Skilled Nursing Unit    cc: Primary Care Physician:  Jodie Echevaria, MD  [address] [place] [address]     MW:[id number] Physician:  Armstead Peaks, MD  [facility]  [address]  [place], [place] [address]     [place] P Adelheid Hoggard, MD

## 2011-01-07 NOTE — Progress Notes (Signed)
[facility]                                                  UROLOGY  PROGRESS NOTE    Christy Hale, Christy Hale, 70 y.o. female  Date of Admission:  12/31/2010  Date of Birth:  [birth year]  Date of Service:  01/07/2011    Post Op Day: 7 Days Post-Op S/P Procedure(s) (LRB):  CYSTECTOMY RADICAL (N/A)  PLACEMENT CONDUIT URINARY (N/A)    Subjective: No acute urologic events,currently floor status    Vital Signs:  Temp (24hrs) Max:37.4 C (99.3 F)      Temperature: 36.5 C (97.7 F) (01/07/11 0317)  BP (Non-Invasive): 128/60 mmHg (01/07/11 0556)  MAP (Non-Invasive): 108 mmHG (01/06/11 1143)  Heart Rate: 67  (01/07/11 0401)  Respiratory Rate: 18  (01/07/11 0401)  Pain Score: 6 (01/07/11 0230)  SpO2-1: 97 % (01/06/11 2015)    Current Medications:    Current facility-administered medications:  1/2 NS 1,000 mL  Intravenous Continuous   heparin 5,000 unit/mL injection 5,000 Units 5,000 Units Subcutaneous Q8HRS   verapamil extended release tablet  240 mg Oral Daily with Breakfast   HYDROcodone-acetaminophen (NORCO) 5-325 mg per tablet  1 Tab Oral Q4H PRN   sennosides-docusate sodium (SENOKOT-S) 8.6-50mg  per tablet  1 Tab Oral 2x/day   docusate sodium (COLACE) capsule  100 mg Oral 2x/day   insulin glargine (LANTUS) 100 units/mL injection  30 Units Subcutaneous NIGHTLY   citalopram (CELEXA) tablet  40 mg Gastric (NG, OG, PEG, GT) Daily   esomeprazole (NEXIUM) capsule  40 mg Gastric (NG, OG, PEG, GT) QAM   acetaminophen (TYLENOL) tablet  650 mg Gastric (NG, OG, PEG, GT) Q6H PRN   prenatal vitamin-iron fumarate-folic acid  (NATALCARE PLUS) 27-0.8mg  per tablet  1 Tab Gastric (NG, OG, PEG, GT) Daily   simvastatin (ZOCOR) tablet  40 mg Gastric (NG, OG, PEG, GT) QPM   sodium phosphate 15 mmol in D5W 100 mL IVPB 15 mmol Intravenous Once   SSIP insulin R human (HUMULIN R) 100 units/mL injection  4-12 Units Subcutaneous 4x/day PRN    ondansetron (ZOFRAN) 2 mg/mL injection  4 mg Intravenous Q6H PRN   NS 10 mL injection  2 mL Intracatheter Q8HRS   NS 10 mL injection  2-6 mL Intracatheter Q1 MIN PRN   labetalol (TRANDATE) 5 mg/mL injection  5 mg Intravenous Q2H PRN   hydrALAZINE (APRESOLINE) injection 10 mg 10 mg Intravenous Q4H PRN         Today's Physical Exam:  Constitutional - NAD  Eyes - Clear  Neuro - alert  CV - RR  Pulm - CTA B/L  GI - Soft, stoma is pink/viable, incision c/d/i  Skin - Warm and Dry  GU - pelvic drain in place with serosanguinous drainage      I/O:   Intake/Output Summary (Last 24 hours) at 01/07/11 0718  Last data filed at 01/07/11 0600   Gross per 24 hour   Intake   1400 ml   Output   2425 ml   Net  -1025 ml       I/O current shift:  03/20 0000 - 03/20 0759  In: 410 [P.O.:60; I.V.:350]  Out: 900 [Urine:850; Drains:50]    Prophylaxis:  Date Started Date Completed   DVT/PE SCDs/ Venodynes     GI  Nutrition/Residuals:  Full Liquid       Labs  Please indicate ordered or reviewed)  Reviewed: BMP:     Recent Labs   [facility] 01/07/11 0103   . SODIUM 137   . POTASSIUM 3.6   . CHLORIDE 105   . CO2 25   . BUN 7   . CREATININE 0.84   . GLUCOSENF 61*   . GLUCOSEFAST --   . ANIONGAP 7   . BUNCRRATIO 8   . GFR >59   . CALCIUM 8.3*       CBC Results Differential Results   Recent Labs   [facility] 01/07/11 0123   . WBC 13.7*   . HGB 10.3*   . HCT 32.0*   . PLTCNT 263      Recent Results (from the past 30 hour(s))   CBC/DIFF    Collection Time    01/07/11  1:23 AM   Component Value Comment   . WBC 13.7    . PMN'S 67    . LYMPHOCYTES 21    . MONOCYTES 10    . EOSINOPHIL 2    . BASOPHILS 0    . NRBC'S 0             Assessment/ Plan:   Active Hospital Problems   (*Primary Problem)   Diagnoses   . *Bladder cancer   . Hypocalcemia   . Metabolic acidosis   . Hypomagnesemia   . Hx of total cystectomy   . Hx of unilateral nephrectomy   . Respiratory failure following trauma and surgery   . AKI (acute kidney injury)   . Type 2 diabetes mellitus      dx 20 + yrs ago, does not check fs at home     . Hyperlipidemia       S/p Open radical cystectomy with closure of vaginal cuff and urethrectomy    UOP increased, creatinine improved  OOB   Ambulating  Remove foley  D/c central line  D/c to SNF -- RTC 2 weeks    [place] P Deval Mroczka, MD 01/07/2011, 7:18 AM

## 2011-01-07 NOTE — Care Management Notes (Signed)
 Care Coordinator/Social Work Plan  [place]  Patient Name: Christy Hale MRN: [id number] Acct Number: [id number]  DOB: April 06, 1941 Age: 70  **Admission Information**  Patient Type: INPATIENT  Admit Date: 12/31/2010 Admit Time: 04:52  Admit Reason: BLADDER CANCER  Admitting Phys: ARMAND RODERIC ORN  Attending Phys: ARMAND RODERIC ORN  Unit: 8W Bed: 826-A  130. CM Assessment Notes  Created by : Comer Border Date/Time 2011-01-07 09:21:26.670  Assessment Notes  Note:  Note: MSW updated pt.'s bedside RN Melissa regarding pt.'s plan for transition this morning. MSW received a message  from Katina at [facility] who reported she received pt.'s d/c summary and facility will accept pt. today.   Consult and Reassessment Will continue to re-assess for changes in plan of care or discharge needs

## 2011-01-08 NOTE — Care Management Notes (Signed)
Referral Information  ++++++ Placed Provider #1 ++++++  Case Manager: Judeth Cornfield  Provider Type: Skilled Nursing  Provider Name: [facility].  Address:  [address]  [place], [place] [address]  Contact: Admissions Administrator  Phone: [phone] x  Fax:   Fax: [phone]

## 2011-01-09 ENCOUNTER — Encounter (FREE_STANDING_LABORATORY_FACILITY)
Admit: 2011-01-09 | Discharge: 2011-01-09 | Disposition: A | Discharge status: Home / Self Care | Payer: Medicare Other | Attending: EXTERNAL | Admitting: EXTERNAL

## 2011-01-09 LAB — CBC
HCT: 30.4 % — ABNORMAL LOW (ref 33.5–45.2)
HGB: 9.8 g/dL — ABNORMAL LOW (ref 11.5–15.2)
MCH: 28.8 pg (ref 27.4–33.0)
MCHC: 32.4 g/dL (ref 31.6–35.5)
MCV: 88.8 fL (ref 82.0–99.0)
MPV: 7 FL — ABNORMAL LOW (ref 7.4–10.4)
PLATELET COUNT: 363 THOU/uL (ref 140–450)
RBC: 3.42 MIL/uL — ABNORMAL LOW (ref 3.84–5.04)
RDW: 13.3 % (ref 10.2–14.0)
WBC: 12 THOU/uL — ABNORMAL HIGH (ref 3.5–11.0)

## 2011-01-09 LAB — ELECTROLYTES
ANION GAP: 7 mmol/L (ref 5–16)
CARBON DIOXIDE: 25 mmol/L (ref 22–32)
CHLORIDE: 104 mmol/L (ref 96–111)
POTASSIUM: 4.2 mmol/L (ref 3.5–5.1)
SODIUM: 136 mmol/L (ref 136–145)

## 2011-01-09 LAB — LIPID PANEL
CHOLESTEROL: 104 mg/dL (ref <200)
HDL-CHOLESTEROL: 51 mg/dL (ref >39)
LDL (CALCULATED): 38 mg/dL (ref <100)
NON - HDL (CALCULATED): 53 mg/dL (ref <190)
TRIGLYCERIDES: 73 mg/dL (ref <150)
VLDL (CALCULATED): 15 mg/dL (ref <30)

## 2011-01-09 LAB — AST (SGOT): AST (SGOT): 27 U/L (ref 8–41)

## 2011-01-09 LAB — TOTAL PROTEIN: TOTAL PROTEIN: 5 g/dL — ABNORMAL LOW (ref 6.4–8.3)

## 2011-01-09 LAB — HGA1C (HEMOGLOBIN A1C WITH EST AVG GLUCOSE)
ESTIMATED AVERAGE GLUCOSE: 137 mg/dL
HEMOGLOBIN A1C: 6.4 % — ABNORMAL HIGH (ref 4.0–6.0)

## 2011-01-09 LAB — BUN
BUN/CREAT RATIO: 8 (ref 6–22)
BUN: 10 mg/dL (ref 6–20)

## 2011-01-09 LAB — CREATININE WITH EGFR: ESTIMATED GLOMERULAR FILTRATION RATE: 44 ml/min/1.73m2 — ABNORMAL LOW (ref >59)

## 2011-01-09 LAB — CREATININE: CREATININE: 1.2 mg/dL — ABNORMAL HIGH (ref 0.49–1.10)

## 2011-01-09 LAB — MAGNESIUM: MAGNESIUM: 1.8 mg/dL (ref 1.7–2.5)

## 2011-01-09 LAB — CALCIUM: CALCIUM: 8.2 mg/dL — ABNORMAL LOW (ref 8.5–10.4)

## 2011-01-10 LAB — BASIC METABOLIC PANEL
BUN: 12 mg/dL (ref 6–23)
CO2: 27 mEq/L (ref 19–32)
Glucose, Bld: 106 mg/dL — ABNORMAL HIGH (ref 70–99)
Potassium: 4.7 mEq/L (ref 3.5–5.1)
Sodium: 138 mEq/L (ref 135–145)

## 2011-01-10 LAB — CBC
Hemoglobin: 14.5 g/dL (ref 12.0–15.0)
MCHC: 34.2 g/dL (ref 30.0–36.0)
MCV: 94.3 fL (ref 78.0–100.0)
Platelets: 241 10*3/uL (ref 150–400)
RDW: 13.8 % (ref 11.5–15.5)
WBC: 6 10*3/uL (ref 4.0–10.5)

## 2011-01-10 LAB — PROTIME-INR
INR: 0.99 (ref 0.00–1.49)
Prothrombin Time: 13 seconds (ref 11.6–15.2)

## 2011-01-13 ENCOUNTER — Encounter (FREE_STANDING_LABORATORY_FACILITY)
Admit: 2011-01-13 | Discharge: 2011-01-13 | Disposition: A | Discharge status: Home / Self Care | Payer: Medicare Other | Attending: EXTERNAL | Admitting: EXTERNAL

## 2011-01-13 DIAGNOSIS — N390 Urinary tract infection, site not specified: Secondary | ICD-10-CM | POA: Diagnosis present

## 2011-01-13 LAB — URINALYSIS, MACROSCOPIC AND MICROSCOPIC
BILIRUBIN: NEGATIVE
GLUCOSE: 30 mg/dL — AB
KETONES: NEGATIVE mg/dL
NITRITE: POSITIVE — AB
PH URINE: 6 (ref 5.0–8.0)
PROTEIN: 30 mg/dL — AB
RBC'S: 52 /HPF — ABNORMAL HIGH (ref 0–4)
SPECIFIC GRAVITY, URINE: 1.012 (ref 1.005–1.030)
UROBILINOGEN: NORMAL mg/dL
WBC'S: 38 /HPF — ABNORMAL HIGH (ref 0–6)

## 2011-01-14 ENCOUNTER — Other Ambulatory Visit: Payer: Self-pay | Admitting: Dermatology

## 2011-01-18 LAB — WOUND, SUPERFICIAL/NON-STERILE SITE, AEROBIC CULTURE AND GRAM STAIN: GRAM STAIN: NONE SEEN

## 2011-01-31 ENCOUNTER — Ambulatory Visit: Payer: Medicare Other | Attending: Urology | Admitting: Urology

## 2011-01-31 ENCOUNTER — Encounter (INDEPENDENT_AMBULATORY_CARE_PROVIDER_SITE_OTHER): Payer: Self-pay | Admitting: Urology

## 2011-01-31 VITALS — BP 100/60 | Temp 98.1°F | Wt 179.0 lb

## 2011-01-31 DIAGNOSIS — Z483 Aftercare following surgery for neoplasm: Secondary | ICD-10-CM | POA: Insufficient documentation

## 2011-01-31 DIAGNOSIS — C679 Malignant neoplasm of bladder, unspecified: Secondary | ICD-10-CM | POA: Insufficient documentation

## 2011-01-31 NOTE — Progress Notes (Signed)
S:  The patient is a very pleasant 70 y.o. female     NAME OF PROCEDURES:   1. Exploratory Laparotomy.  2. Radical cystectomy with creation of an ileoconduit diversion.   3. Urethrectomy, along with removal of the anterior vaginal wall.   4. Bilateral pelvic lymphadenectomy.   5. Appendectomy  5. Retrograde insertion of a 6 X 20 cm double J ureteral stent into the right ureter  6. Exploratory laparotomy       Final Pathologic Diagnosis    A. RIGHT DISTAL URETER, BIOPSY:   - Cross section of benign ureter identified.    B. URINARY BLADDER AND ANTERIOR VAGINAL WALL, RADICAL CYSTECTOMY:  - Focal residual invasive high-grade urothelial carcinoma. See checklist.  - No invasion of muscularis propria identified.  - Resection margins are free of neoplasm.  - Chronic denuding cystitis.  - Biopsy site reaction.     C. RIGHT PELVIC LYMPH NODES, DISSECTION:  - Six lymph nodes, negative for malignancy (0/6).  - Fatty change of lymph nodes identified.    D. LEFT PELVIC LYMPH NODES, DISSECTION:  - Three lymph nodes, negative for malignancy (0/3).  - Fatty change of lymph nodes identified.    E. APPENDIX, APPENDECTOMY:  - Fibrous obliteration of appendiceal lumen.      BLADDER CANCER CASE SUMMARY (CHECKLIST)    Specimen and Procedure: Bladder, radical cystectomy.    Tumor Size: Greatest dimension: 0.1 cm.    Histologic Type: Urothelial (transitional cell) carcinoma.    Associated Epithelial Lesions: Not identified.    Histologic Grade: High-grade.    Microscopic Tumor Extension: None identified (tumor confined to bladder).    Margins: Uninvolved by invasive carcinoma.    Lymph-Vascular Invasion: Not identified.    Pathologic Staging: pT1, pN0, pMX.  Number of lymph nodes examined: 9 (see parts C and D).  Number of lymph nodes involved: 0.  The patient denies any nausea, vomiting, chest pain, loc, sob, dyspnea, fevers, chills, abdominal/flank pain, weight loss, dysuria, or hematuria.     O:  Filed Vitals:    01/31/11 1210    BP: 100/60   Temp: 36.7 C (98.1 F)   TempSrc: Tympanic   Weight: 81.194 kg (179 lb)       Constitutional - NAD  Eyes - Clear  Neuro - AAO x 3   CV - RR  Pulm - CTA B  GI - S-NTND + BS, No flank tenderness, Staples Removed  Skin - Warm and Dry  GU - Ostomy is p/v/i, yellow urine, stent string visible    A/P:  1) T1 urothelial cancer  -RTC 4 weeks for ureteral stent removal

## 2011-02-03 ENCOUNTER — Inpatient Hospital Stay (HOSPITAL_BASED_OUTPATIENT_CLINIC_OR_DEPARTMENT_OTHER)
Admission: EM | Admit: 2011-02-03 | Discharge: 2011-02-03 | Disposition: A | Discharge status: Home / Self Care | Payer: Medicare Other

## 2011-02-04 ENCOUNTER — Inpatient Hospital Stay (HOSPITAL_BASED_OUTPATIENT_CLINIC_OR_DEPARTMENT_OTHER)
Admission: EM | Admit: 2011-02-04 | Discharge: 2011-02-04 | Disposition: A | Discharge status: Home / Self Care | Payer: Medicare Other

## 2011-02-05 ENCOUNTER — Inpatient Hospital Stay (HOSPITAL_BASED_OUTPATIENT_CLINIC_OR_DEPARTMENT_OTHER)
Admission: EM | Admit: 2011-02-05 | Discharge: 2011-02-05 | Disposition: A | Discharge status: Home / Self Care | Payer: Medicare Other

## 2011-02-05 NOTE — Progress Notes (Signed)
I saw and evaluated the patient. I reviewed the resident's note. I agree with the findings and plan of care as documented in the resident's note. Any exceptions/additions are noted.      Mohamad W Salkini, MD 02/05/2011, 9:58 AM

## 2011-02-24 ENCOUNTER — Inpatient Hospital Stay (HOSPITAL_BASED_OUTPATIENT_CLINIC_OR_DEPARTMENT_OTHER)
Admission: EM | Admit: 2011-02-24 | Discharge: 2011-02-24 | Disposition: A | Discharge status: Home / Self Care | Payer: Medicare Other

## 2011-02-26 ENCOUNTER — Inpatient Hospital Stay (HOSPITAL_BASED_OUTPATIENT_CLINIC_OR_DEPARTMENT_OTHER)
Admission: EM | Admit: 2011-02-26 | Discharge: 2011-02-26 | Disposition: A | Discharge status: Home / Self Care | Payer: Medicare Other

## 2011-02-28 ENCOUNTER — Ambulatory Visit (INDEPENDENT_AMBULATORY_CARE_PROVIDER_SITE_OTHER): Payer: Medicare Other | Admitting: Urology

## 2011-03-14 ENCOUNTER — Other Ambulatory Visit: Payer: Self-pay | Admitting: Obstetrics and Gynecology

## 2011-03-14 DIAGNOSIS — N644 Mastodynia: Secondary | ICD-10-CM

## 2011-03-21 ENCOUNTER — Ambulatory Visit
Admission: RE | Admit: 2011-03-21 | Discharge: 2011-03-21 | Disposition: A | Discharge status: Home / Self Care | Payer: Medicare Other | Source: Ambulatory Visit | Attending: Obstetrics and Gynecology | Admitting: Obstetrics and Gynecology

## 2011-03-21 ENCOUNTER — Ambulatory Visit
Admission: RE | Admit: 2011-03-21 | Discharge: 2011-03-21 | Disposition: A | Discharge status: Home / Self Care | Payer: Self-pay | Source: Ambulatory Visit | Attending: Obstetrics and Gynecology | Admitting: Obstetrics and Gynecology

## 2011-03-21 DIAGNOSIS — N644 Mastodynia: Secondary | ICD-10-CM

## 2011-05-25 ENCOUNTER — Inpatient Hospital Stay (HOSPITAL_BASED_OUTPATIENT_CLINIC_OR_DEPARTMENT_OTHER)
Admission: EM | Admit: 2011-05-25 | Discharge: 2011-05-25 | Disposition: A | Discharge status: Home / Self Care | Payer: Medicare Other

## 2011-06-13 ENCOUNTER — Inpatient Hospital Stay (HOSPITAL_BASED_OUTPATIENT_CLINIC_OR_DEPARTMENT_OTHER)
Admission: EM | Admit: 2011-06-13 | Discharge: 2011-06-13 | Disposition: A | Discharge status: Home / Self Care | Payer: Medicare Other

## 2011-06-24 ENCOUNTER — Other Ambulatory Visit: Payer: Self-pay | Admitting: Dermatology

## 2011-08-18 ENCOUNTER — Inpatient Hospital Stay (HOSPITAL_BASED_OUTPATIENT_CLINIC_OR_DEPARTMENT_OTHER)
Admission: EM | Admit: 2011-08-18 | Discharge: 2011-08-18 | Disposition: A | Discharge status: Home / Self Care | Payer: Medicare Other

## 2011-10-24 LAB — ELECTROLYTES
ALBUMIN (SERUM): 3.3
ALKALINE PHOSPHATASE: 162
BILIRUBIN, TOTAL: 0.3
BUN: 30
CALCIUM: 8.9
CARBON DIOXIDE: 26.6
CHLORIDE: 98
CREATININE: 1.6
ESTIMATED GLOMERULAR FILTRATION RATE: 34
FERRITIN: 12
GLUCOSE,NONFAST: 291
HCT: 34
HEMOGLOBIN A1C: 9.6
HGB: 11.1
IRON BINDING CAPACITY: 351
IRON: 29
PLATELET COUNT: 293
POTASSIUM: 4.1
SODIUM: 134
WBC: 9.4

## 2011-11-03 DIAGNOSIS — IMO0001 Reserved for inherently not codable concepts without codable children: Secondary | ICD-10-CM | POA: Diagnosis not present

## 2011-11-03 DIAGNOSIS — M9981 Other biomechanical lesions of cervical region: Secondary | ICD-10-CM | POA: Diagnosis not present

## 2011-11-03 DIAGNOSIS — M5137 Other intervertebral disc degeneration, lumbosacral region: Secondary | ICD-10-CM | POA: Diagnosis not present

## 2011-11-20 ENCOUNTER — Inpatient Hospital Stay (HOSPITAL_BASED_OUTPATIENT_CLINIC_OR_DEPARTMENT_OTHER)
Admission: EM | Admit: 2011-11-20 | Discharge: 2011-11-20 | Disposition: A | Discharge status: Home / Self Care | Payer: Medicare Other

## 2011-12-03 ENCOUNTER — Ambulatory Visit (INDEPENDENT_AMBULATORY_CARE_PROVIDER_SITE_OTHER): Payer: Medicare Other

## 2011-12-08 ENCOUNTER — Encounter (INDEPENDENT_AMBULATORY_CARE_PROVIDER_SITE_OTHER): Payer: Self-pay | Admitting: Nephrology

## 2011-12-14 ENCOUNTER — Inpatient Hospital Stay (HOSPITAL_BASED_OUTPATIENT_CLINIC_OR_DEPARTMENT_OTHER)
Admission: EM | Admit: 2011-12-14 | Discharge: 2011-12-14 | Disposition: A | Discharge status: Home / Self Care | Payer: Medicare Other

## 2011-12-24 DIAGNOSIS — Z85820 Personal history of malignant melanoma of skin: Secondary | ICD-10-CM | POA: Diagnosis not present

## 2011-12-24 DIAGNOSIS — R82998 Other abnormal findings in urine: Secondary | ICD-10-CM | POA: Diagnosis not present

## 2011-12-24 DIAGNOSIS — R7301 Impaired fasting glucose: Secondary | ICD-10-CM | POA: Diagnosis not present

## 2011-12-24 DIAGNOSIS — L821 Other seborrheic keratosis: Secondary | ICD-10-CM | POA: Diagnosis not present

## 2011-12-24 DIAGNOSIS — L723 Sebaceous cyst: Secondary | ICD-10-CM | POA: Diagnosis not present

## 2011-12-24 DIAGNOSIS — E785 Hyperlipidemia, unspecified: Secondary | ICD-10-CM | POA: Diagnosis not present

## 2011-12-24 DIAGNOSIS — E559 Vitamin D deficiency, unspecified: Secondary | ICD-10-CM | POA: Diagnosis not present

## 2011-12-24 DIAGNOSIS — Z85828 Personal history of other malignant neoplasm of skin: Secondary | ICD-10-CM | POA: Diagnosis not present

## 2011-12-24 DIAGNOSIS — M899 Disorder of bone, unspecified: Secondary | ICD-10-CM | POA: Diagnosis not present

## 2011-12-31 DIAGNOSIS — Z0000 Encounter for general adult medical examination without abnormal findings: Secondary | ICD-10-CM | POA: Diagnosis not present

## 2011-12-31 DIAGNOSIS — E785 Hyperlipidemia, unspecified: Secondary | ICD-10-CM | POA: Diagnosis not present

## 2011-12-31 DIAGNOSIS — R7301 Impaired fasting glucose: Secondary | ICD-10-CM | POA: Diagnosis not present

## 2011-12-31 DIAGNOSIS — M899 Disorder of bone, unspecified: Secondary | ICD-10-CM | POA: Diagnosis not present

## 2012-01-22 DIAGNOSIS — Z1212 Encounter for screening for malignant neoplasm of rectum: Secondary | ICD-10-CM | POA: Diagnosis not present

## 2012-03-03 DIAGNOSIS — H43399 Other vitreous opacities, unspecified eye: Secondary | ICD-10-CM | POA: Diagnosis not present

## 2012-03-03 DIAGNOSIS — H524 Presbyopia: Secondary | ICD-10-CM | POA: Diagnosis not present

## 2012-03-03 DIAGNOSIS — Z961 Presence of intraocular lens: Secondary | ICD-10-CM | POA: Diagnosis not present

## 2012-03-03 DIAGNOSIS — H26499 Other secondary cataract, unspecified eye: Secondary | ICD-10-CM | POA: Diagnosis not present

## 2012-03-25 DIAGNOSIS — Z1231 Encounter for screening mammogram for malignant neoplasm of breast: Secondary | ICD-10-CM | POA: Diagnosis not present

## 2012-03-25 DIAGNOSIS — Z124 Encounter for screening for malignant neoplasm of cervix: Secondary | ICD-10-CM | POA: Diagnosis not present

## 2012-04-09 ENCOUNTER — Encounter (INDEPENDENT_AMBULATORY_CARE_PROVIDER_SITE_OTHER): Payer: Self-pay | Admitting: Ophthalmology

## 2012-04-09 ENCOUNTER — Ambulatory Visit
Admission: RE | Admit: 2012-04-09 | Discharge: 2012-04-09 | Disposition: A | Discharge status: Home / Self Care | Payer: Medicare Other | Source: Ambulatory Visit | Attending: Ophthalmology | Admitting: Ophthalmology

## 2012-04-09 ENCOUNTER — Ambulatory Visit (HOSPITAL_BASED_OUTPATIENT_CLINIC_OR_DEPARTMENT_OTHER): Payer: Medicare Other | Admitting: Ophthalmology

## 2012-04-09 NOTE — Patient Instructions (Signed)
Follow up and instructions as per surgery coordinator.

## 2012-04-09 NOTE — Progress Notes (Signed)
[facility]  [facility]  Operated by [facility]  [address]  [place] [address]  Dept: [phone]    Patient Name: Christy Hale  MRN# [id number]    Date of Service: 04/09/2012    Chief Complaint:   Chief Complaint   Patient presents with   . Cataract Evaluation     Patient c/o gradual decrease in vision OD at distance and near over the past couple of years. Patient states vision OS has gradually decreased at distance and near over the past year. Patient states bright sunlight bothersome to eyes.       Past History  Current Outpatient Prescriptions   Medication Sig   . INSULIN LISPRO (HUMALOG SUBQ) 8 Units by Subcutaneous route Twice daily.   . enalapril (VASOTEC) 2.5 mg Oral Tablet take 2.5 mg by mouth Once a day.   Marland Kitchen NIACIN, INOSITOL NIACINATE, ORAL take 500 mg by mouth Once a day.   . verapamil 240 mg Oral Tablet Sustained Release take 240 mg by mouth Once a day.   . citalopram (CELEXA) 40 mg Oral Tablet take 40 mg by mouth Once a day.   . INSULIN GLARGINE,HUM.REC.ANLOG (LANTUS SUBQ) 34 Units by Subcutaneous route Once a day.   . traMADol (ULTRAM) 50 mg Oral Tablet    . cloNIDine (CATAPRES-TTS) 0.1 mg/24 hr Transdermal Patch Weekly    . furosemide (LASIX) 20 mg Oral Tablet    . levothyroxine (SYNTHROID) 25 mcg Oral Tablet    . metoclopramide HCl (REGLAN) 5 mg Oral Tablet    . KLOR-CON M20 20 mEq Oral Tab Sust.Rel. Particle/Crystal    . omeprazole (PRILOSEC) 20 mg Oral Capsule, Delayed Release(E.C.)    . simvastatin (ZOCOR) 40 mg Oral Tablet    . promethazine (PHENERGAN) 25 mg Oral Tablet    . hydrocodone-Acetaminophen (NORCO) 5-325 mg Oral Tablet tablet take 1 Tab by mouth Every 4 hours as needed for Pain.   . sennosides-docusate sodium (SENOKOT-S) 8.6-50 mg Oral Tablet take 1 Tab by mouth QPM.   . Chromium Picolinate 500 mcg Oral Capsule take 1 Tab by mouth Twice daily.    . Hydrocodone-Acetaminophen (LORTAB) 2.5-500 mg Oral Tablet tablet take 1 Tab by mouth Every 6 hours as needed.     No Known Allergies  Past Medical History   Diagnosis Date   . Hypertension    . Ulcer disease    . Anemia    . Lung cancer      left lung, unknown type   . Arthritis    . Diabetes mellitus    . Hyperlipidemia    . History of transfusion      no reaction   . GERD (gastroesophageal reflux disease)      hx of, no longer takes meds   . Kidney disease      s/p nephrectomy due to cancer   . Type 2 diabetes mellitus      dx 20 + yrs ago, does not check fs at home   . cancer      s/p renal cancer, lung cancer, skin cancer, and now with bladder cancer     Past Surgical History   Procedure Date   . Hx hysterectomy    . Hx hand surgery      bilateral   . Hx foot surgery      right   . Hx other      left arm surgery   . Hx nephrectomy  with ureterectomy (left)   . Hx other      left lung cancer removal   . Hx laser eye surgery      laser OU for DM     Family History  Family History   Problem Relation Age of Onset   . Diabetes Mother    . Diabetes Father    . Hypertension Mother    . Hypertension Father    . Coronary Artery Disease Mother    . Lung Cancer Sister      smoker     Social History  History   Substance Use Topics   . Smoking status: Former Games developer   . Smokeless tobacco: Not on file    Comment: Pt quit smoking over 40 years, smoked 8 yrs   . Alcohol Use: Yes      wine rarely     Review of Systems  ENDOCRINE: (diabetes, thyroid, etc.): Positive  Comments: BS 245 this am  All others negative: yes    Donalynn Furlong, COMT 04/09/2012, 9:12 AM      HPI: Visual complaints, blurred    Assessment: Visual significant cataract right eye. History of laser for DR, PPV too.    Plan: Risks, benefits, and nature of cataract extraction with lens implant reviewed and patient consents to surgery. Plan topical anesthesia with Monitored Anesthesia Care (MAC). Patient to see surgery coordinator today to schedule surgery.   Special needs: avastin    I reviewed and made necessary changes to the technician, resident or fellow note regarding CC/HPI/ROS/Past Family, Medical and Social Hx/Surg Hx.  Kathleen Argue, MD 04/09/2012, 10:07 AM      Kathleen Argue, MD 04/09/2012, 10:07 AM

## 2012-04-15 DIAGNOSIS — H26499 Other secondary cataract, unspecified eye: Secondary | ICD-10-CM | POA: Diagnosis not present

## 2012-05-03 DIAGNOSIS — H26499 Other secondary cataract, unspecified eye: Secondary | ICD-10-CM | POA: Diagnosis not present

## 2012-05-07 ENCOUNTER — Ambulatory Visit: Payer: Medicare Other | Attending: Urology | Admitting: Urology

## 2012-05-07 ENCOUNTER — Encounter (INDEPENDENT_AMBULATORY_CARE_PROVIDER_SITE_OTHER): Payer: Self-pay | Admitting: Urology

## 2012-05-07 VITALS — BP 122/62 | Temp 98.9°F

## 2012-05-07 DIAGNOSIS — R58 Hemorrhage, not elsewhere classified: Secondary | ICD-10-CM | POA: Insufficient documentation

## 2012-05-07 DIAGNOSIS — R109 Unspecified abdominal pain: Secondary | ICD-10-CM | POA: Insufficient documentation

## 2012-05-07 DIAGNOSIS — Z859 Personal history of malignant neoplasm, unspecified: Secondary | ICD-10-CM | POA: Insufficient documentation

## 2012-05-07 MED ORDER — NITROFURANTOIN MACROCRYSTAL 50 MG CAPSULE
50.00 mg | ORAL_CAPSULE | Freq: Every evening | ORAL | Status: DC
Start: 2012-05-07 — End: 2012-07-05

## 2012-05-07 NOTE — Progress Notes (Signed)
Chief Complaint   Patient presents with   . Stoma Irritation     having pain at stom site had bloody drainage two weeks ago      S:  71 yo female w history of urothelial cancer s/p LEFT nephroureterectomy in 2008 then radical cystectomy w ileal conduit on 12-31-10.   Apparently her stent "fell out" so she never came back.   We have not seen her since April 2012.   Since then she has been having infections about every month.  Her symptoms are back pain, fever, chills, nausea vomiting.  About 2 months ago she saw a urologist in [place] who "shot some dye up there" and told her everything was fine.   10d-2 weeks ago she noticed a small amount of blood on her stoma and she is having back pain again.   As far as she can remember she hasn't had any imaging over the last year.  No one ever called here to let us know of any of these symptoms or make follow up appointments.    Last creatinine in our system was January 2013 and was 1.6.    BLADDER CANCER CASE SUMMARY (CHECKLIST)  Specimen and Procedure: Bladder, radical cystectomy.  Tumor Size: Greatest dimension: 0.1 cm.  Histologic Type: Urothelial (transitional cell) carcinoma.  Associated Epithelial Lesions: Not identified.  Histologic Grade: High-grade.  Microscopic Tumor Extension: None identified (tumor confined to bladder).  Margins: Uninvolved by invasive carcinoma.  Lymph-Vascular Invasion: Not identified.  Pathologic Staging: pT1, pN0, pMX.  Number of lymph nodes examined: 9 (see parts C and D).  Number of lymph nodes involved: 0.     01/02/2011 05:54 01/03/2011 00:40 01/04/2011 01:08 01/05/2011 00:50 01/06/2011 03:30 01/07/2011 01:03 01/09/2011 05:00 10/24/2011    CREATININE 3.10 (H) 1.85 (H) 1.10 0.82 0.68 0.84 1.20 (H) 1.6     BP 122/62     O:  Pt is alert and oriented, in no acute distress.  She looks much older than her stated age and appears vitally stable.   she is resting comfortably on her back on a clinic bed with an unlabored respiratory effort.   Skin is warm and dry and pt appears neurologically intact.  Pt does not ambulate well, is moved in a wheelchair.  The abdomen soft, nontender, nondistended.   GU:  There is left CVA tenderness.  Genital exam deferred.    A:    1.  Hx urothelial cancer s/p cystectomy w ileal conduit   2.  LEFT nephroureterctomy 2008   3.  Recurrent UTI   4.  Right flank pain   5.  Bleeding stoma    P:  Advised pt and her family that they should have called [place] a long time ago or just followed up.   She will need imaging since it's been over a year from surgery.  She will follow up in a few weeks w a PET CT.   We have asked they get the records from the outside urologist so we can review what was done.   We are going to put her on daily macrobid for UTI prophylaxis.      Toniann Fail PA-C  Surgical Specialties, UROLOGY  I saw and examined the patient with my PA, I reviewed and confirmed with the history and physical. I reviewed and confirmed with the medical, surgical, family, and social hx along with the medicaltion. My physical exam is:  GU : Negative CVA, negative ureteral points, normal external genitalia.  RS: Clear lung sounds bilaterally    CVS: RRR no accesory sounds.  GI soft non tender abdomen, normal bowel sounds, normally looking stoma.   Psych: oriented x3  Muskuloskeletal: WNL  Skin WNL   CNS: grossly normal  Extremities: no pedal edema.    Hx of bladder cancer, s/p radical cystectomy with urinary diversion to ileal conduit.  Patient is doing well, she is complaining of bleeding from the stoma, she will have PET CT and then looposcopy    Caryl Ada, MD .

## 2012-05-20 ENCOUNTER — Encounter (HOSPITAL_COMMUNITY): Payer: Self-pay

## 2012-05-20 ENCOUNTER — Ambulatory Visit
Admission: RE | Admit: 2012-05-20 | Discharge: 2012-05-20 | Disposition: A | Discharge status: Home / Self Care | Payer: Medicare Other | Source: Ambulatory Visit | Attending: Ophthalmology | Admitting: Ophthalmology

## 2012-05-20 HISTORY — DX: Morbid (severe) obesity due to excess calories: E66.01

## 2012-05-20 HISTORY — DX: Shortness of breath: R06.02

## 2012-05-20 HISTORY — DX: Muscle weakness (generalized): M62.81

## 2012-05-20 HISTORY — DX: Urinary tract infection, site not specified: N39.0

## 2012-05-20 LAB — PERFORM POC WHOLE BLOOD GLUCOSE: GLUCOSE, POINT OF CARE: 140 mg/dL — ABNORMAL HIGH (ref 70–105)

## 2012-05-20 NOTE — Anesthesia Preprocedure Evaluation (Addendum)
 Airway       Mallampati: III    TM distance: >3 FB    Neck ROM: full  Mouth Opening: good.  No endotracheal tube present  No Tracheostomy present    Dental           (+) upper dentures and lower dentures           Pulmonary    Breath sounds clear to auscultation    (-) no rhonchi, no decreased breath sounds, no wheezes, no rales, no stridor Cardiovascular    Rhythm: regular  Rate: Normal  (-) no friction rub and no murmur     Other findings              Planned anesthesia type: MAC  ASA 3              Anesthetic plan and risks discussed with patient.              (General risks of MAC reviewed with patient who appears to understand and agrees to proceeding as planned. )    The Anesthesia Plan above was formulated based on the history, results, and physical exam performed immediately prior to the procedure/surgery.          EKG: Not ordered  CXR: Not ordered  Other Studies: None  Consults: None    STOP BANG Score (0-8): 3    Patient instructed to take the following medications day of surgery,levothyroxine , metoclopramide , omeprazole, clonidine .  Pt instructed to take 1/2 dose lantus  night before.  Pt instructed to cover with fast acting insulin  am of surgery if glucose greater than 200 per our sliding scale.  Pt given sliding scale to use am of surgery.

## 2012-05-20 NOTE — Discharge Instructions (Signed)
KNOWN DIABETES  Diabetes education can help you manage your diabetes.  The Diabetes Education Center at [facility] is a self-management education program, serving people individually and in a group setting.  The goal of the Center is to provide up-to-date information and learning tools that people who have diabetes need to live a healthy life.  The body uses sugar/glucose for fuel.  Insulin is the key to move the sugar into the cells.  Diabetes is a disease in which the body does not produce or properly use insulin.  Diabetes is a common but serious problem that affects 1 in 10 adults in [place].  Please contact the Diabetes Education Center for any of the questions or any of the following examples:    Your blood glucose is not in the recommended targets (Standard of Medical Care in Diabetes 2008, ADA).  These are the recommended targets:   Fasting and before meals 70 - 130 mg/dl   1-2 hours after the first bite of your meal 180 mg/dl   V2Z less than 7%  (D6U is a measurement of the average blood glucose levels over the past 2-3 months.  It reveals how you and your treatment plan are working)    If you have never had diabetes education    If your diabetes education was greater than 10 years ago    If you have wide variances/swings in your blood glucose    If you want to help identify your risk and complications from diabetes  The [facility] Diabetes Education Center provides 2 education sessions.  The sessions can be individual or in a group setting.  The sessions are in the morning and afternoon (after 4pm).  Phone [phone]    Fax [phone]  E-mail:  [email or web address]  Web Address:  http: [email or web address]  Mailing address:  [address], [place], [place] [address]  Free Support Group:  Meets 2nd Thursday of every month at [facility]. [facility]) - [place] from 6:30 pm to 7:30pm.  Take the time to learn and be in more control of your diabetes.   Cataract Surgery  Care  After  Refer to this sheet in the next few weeks. These instructions provide you with information on caring for yourself after your procedure. Your caregiver may also give you more specific instructions. Your treatment has been planned according to current medical practices, but problems sometimes occur. Call your caregiver if you have any problems or questions after your procedure.   HOME CARE INSTRUCTIONS    Avoid strenuous activities as directed by your caregiver.   Ask your caregiver when you can resume driving.   Use eyedrops or other medicines to help healing and control pressure inside your eye as directed by your caregiver.   Only take over-the-counter or prescription medicines for pain, discomfort, or fever as directed by your caregiver.   Do not to touch or rub your eyes.   You may be instructed to use a protective shield during the first few days and nights after surgery. If not, wear sunglasses to protect your eyes. This is to protect the eye from pressure or from being accidentally bumped.   Keep the area around your eye clean and dry. Avoid swimming or allowing water to hit you directly in the face while showering. Keep soap and shampoo out of your eyes.   Do not bend or lift heavy objects. Bending increases pressure in the eye. You can walk, climb stairs, and do light household chores.  Do not put a contact lens into the eye that had surgery until your caregiver says it is okay to do so.   Ask your doctor when you can return to work. This will depend on the kind of work that you do. If you work in a dusty environment, you may be advised to wear protective eyewear for a period of time.   Ask your caregiver when it will be safe to engage in sexual activity.   Continue with your regular eye exams as directed by your caregiver.  What to expect:   It is normal to feel itching and mild discomfort for a few days after cataract surgery. Some fluid discharge is also common, and your eye may be  sensitive to light and touch.   After 1 to 2 days, even moderate discomfort should disappear. In most cases, healing will take about 6 weeks.   If you received an intraocular lens (IOL), you may notice that colors are very bright or have a blue tinge. Also, if you have been in bright sunlight, everything may appear reddish for a few hours. If you see these color tinges, it is because your lens is clear and no longer cloudy. Within a few months after receiving an IOL, these extra colors should go away. When you have healed, you will probably need new glasses.  SEEK MEDICAL CARE IF:    You have increased bruising around your eye.   You have discomfort not helped by medicine.  SEEK IMMEDIATE MEDICAL CARE IF:    You have a fever.   You have a worsening or sudden vision loss.   You have redness, swelling, or increasing pain in the eye.   You have a thick discharge from the eye that had surgery.  MAKE SURE YOU:   Understand these instructions.   Will watch your condition.   Will get help right away if you are not doing well or get worse.  Document Released: 04/25/2005 Document Revised: 09/25/2011 Document Reviewed: 05/30/2011  [facility] Patient Information 2012 [place], [place].

## 2012-05-26 ENCOUNTER — Ambulatory Visit
Admission: RE | Admit: 2012-05-26 | Discharge: 2012-05-26 | Disposition: A | Discharge status: Home / Self Care | Payer: Medicare Other | Source: Ambulatory Visit

## 2012-05-26 LAB — PERFORM POC ISTAT CREATININE POINT OF CARE: CREATININE, POC: 1.2 mg/dL — ABNORMAL HIGH (ref 0.49–1.10)

## 2012-05-26 LAB — PERFORM POC WHOLE BLOOD GLUCOSE: GLUCOSE, POINT OF CARE: 229 mg/dL — ABNORMAL HIGH (ref 70–105)

## 2012-05-26 MED ORDER — IOVERSOL 300 MG IODINE/ML INTRAVENOUS SOLUTION
100.00 mL | INTRAVENOUS | Status: AC
Start: 2012-05-26 — End: 2012-05-26
  Administered 2012-05-26: 15:00:00 100 mL via INTRAVENOUS

## 2012-05-26 MED ORDER — DIATRIZOATE MEGLUMINE & SODIUM 66 %-10 % (GASTROGRAFIN) ORAL SOLN
10.00 mL | ORAL | Status: AC
Start: 2012-05-26 — End: 2012-05-26
  Administered 2012-05-26: 15:00:00 10 mL via ORAL

## 2012-05-31 DIAGNOSIS — IMO0001 Reserved for inherently not codable concepts without codable children: Secondary | ICD-10-CM | POA: Diagnosis not present

## 2012-05-31 DIAGNOSIS — M5137 Other intervertebral disc degeneration, lumbosacral region: Secondary | ICD-10-CM | POA: Diagnosis not present

## 2012-05-31 DIAGNOSIS — M9981 Other biomechanical lesions of cervical region: Secondary | ICD-10-CM | POA: Diagnosis not present

## 2012-06-01 ENCOUNTER — Encounter: Payer: Self-pay | Admitting: Internal Medicine

## 2012-06-04 ENCOUNTER — Ambulatory Visit: Payer: Medicare Other | Attending: Urology | Admitting: Urology

## 2012-06-04 VITALS — BP 144/82 | Temp 97.9°F | Wt 215.6 lb

## 2012-06-04 DIAGNOSIS — N390 Urinary tract infection, site not specified: Secondary | ICD-10-CM | POA: Insufficient documentation

## 2012-06-04 DIAGNOSIS — R109 Unspecified abdominal pain: Secondary | ICD-10-CM | POA: Insufficient documentation

## 2012-06-04 NOTE — Progress Notes (Addendum)
 UROLOGY CLINIC  PROGRESS NOTE    Name: Christy Hale  MRN: [id number]  Date of Birth: 07-27-1941  Date of Consultation: 06/04/2012    CHIEF COMPLAINT: No chief complaint on file.      SUBJECTIVE: Christy Hale is a 71 y.o. female who returns for follow-up of urothelial carcinoma.  Patient first episode was urothelial cancer of the upper tract s/p LEFT nephroureterectomy in 2008.  She then underwent radical cystectomy w ileal conduit on 12-31-10.  Patient did not return for follow up until 05/07/12.  In clinic she reported recurrent infections about every month associated with back pain, fever, chills, nausea vomiting. She states that she was seen by a urologist in [place] who shot some dye up there and told her everything was fine. She noticed a small amount of blood on her stoma and she is having back pain again.   She was started on daily macrobid  at last visit, however she states that it makes her nauseous and she will not take it.  Patient was told to return with outside documents and a PET CT.  The imaging was reviewed by myself and Dr. Armand.  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   She did not bring any documents with her.  Her appliance is leaking and she demands a note from Dr. Armand to excuse her from wearing a seat belt which she states pulls the back.  She continues to complain of bilateral back pain which is unchanged.  Results for Christy Hale (MRN [id number]) as of 06/04/2012 13:13   Ref. Range 05/26/2012 14:00   CREATININE, POC Latest Range: 0.49-1.10 mg/dL 1.2 (H)       ROS:  See subjective for pertinent GU symptoms, all other systems negative.    PHYSICAL EXAM:  There were no vitals taken for this visit.  VS: Appears vitally stable  General - alert/oriented; NAD, morbidly obese  Lungs - CTA b/l, unlabored respiratory effort  Heart - RRR, extremity pulses intact  Abdomen - soft, NT/ND; +  bilateral CVAT with spinal tenderness. Ostomy pink and viable with clear urine in appliance  Musculoskeletal - Limited ambulation   Neurological - CN II-XII Grossly intact  GU system - deferred       PET/CT BODY: The patient is status post partial left upper lobe resection. There is scar and suture material in left upper lobe with mild associated hypermetabolic activity with maximum SUV of 2.1, likely relating to postoperative change. No suspicious pulmonary nodules are appreciated.   The patient is also status post radical cystectomy and left nephrectomy with ileal conduit formation. 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 The proximal right ureters are mildly dilated. Narrowing of the distal right ureters could relate to adhesion formation from adjacent bowel. No mass is visualized in this region.   No suspicious foci of hypermetabolic activity are identified in the left nephrectomy bed. Mild hypermetabolic activity associated with the anterior abdominal wall has a maximum SUV of 3.1 and likely relates to a postoperative change.   There is a lobular cystic structure arising from the left adnexa which measures 3.8 x 3.0 cm a maximum SUV of 2.0. This likely represents a benign finding such as an ovarian inclusion cyst.   No suspicious foci of radiotracer uptake are identified in the osseous structures.      No orders of the  defined types were placed in this encounter.       ASSESSMENT:  71 y.o. female with:   1. Hx urothelial cancer s/p cystectomy w ileal conduit 12/2010 for T1 bladder cancer  2. LEFT nephroureterctomy 2008   3. Recurrent UTI- Recommend short course treatment (3days) for symptomatic infections only  4. Bilateral flank pain- likely MSK and chronic  5. Bleeding stoma- resolved      PLAN:  1. Patient advised that her pain is more consistent with MSK back pain and we briefly discussed weight loss however patient was offended.   2. RTC in 6  months with renal U/S to ensure stability of hydronephrosis           Late entry for 06/04/2012. I saw and examined the patient.  I reviewed the resident's note.  I agree with the findings and plan of care as documented in the resident's note.  Any exceptions/additions are edited/noted.    Christy LELON Ivanoff, MD 06/08/2012, 4:32 PM

## 2012-06-09 MED ORDER — CEFUROXIME 1 MG/0.1 ML OPHTH INJ
1.0000 mg | INJECTION | Freq: Once | INTRACAMERAL | Status: DC | PRN
Start: 2012-06-10 — End: 2012-06-10
  Administered 2012-06-10: 1 mg via INTRACAMERAL
  Filled 2012-06-09: qty 0.1

## 2012-06-09 MED ORDER — BEVACIZUMAB 1.25 MG/ 0.05 ML INTRAVITREAL INJ.
1.2500 mg | INJECTION | Freq: Once | INTRAVITREAL | Status: AC
Start: 2012-06-10 — End: 2012-06-10
  Administered 2012-06-10: 1.25 mg via INTRAVITREAL
  Filled 2012-06-09: qty 0.05

## 2012-06-10 ENCOUNTER — Encounter (HOSPITAL_COMMUNITY): Payer: Self-pay

## 2012-06-10 ENCOUNTER — Encounter (HOSPITAL_COMMUNITY)
Admission: RE | Disposition: A | Discharge status: Home / Self Care | Payer: Self-pay | Source: Ambulatory Visit | Attending: Ophthalmology

## 2012-06-10 ENCOUNTER — Inpatient Hospital Stay
Admission: RE | Admit: 2012-06-10 | Discharge: 2012-06-10 | Disposition: A | Discharge status: Home / Self Care | Payer: Medicare Other | Source: Ambulatory Visit | Attending: Ophthalmology | Admitting: Ophthalmology

## 2012-06-10 ENCOUNTER — Ambulatory Visit (HOSPITAL_BASED_OUTPATIENT_CLINIC_OR_DEPARTMENT_OTHER): Payer: Medicare Other | Admitting: Ophthalmology

## 2012-06-10 ENCOUNTER — Ambulatory Visit (INDEPENDENT_AMBULATORY_CARE_PROVIDER_SITE_OTHER): Payer: Self-pay | Admitting: Internal Medicine

## 2012-06-10 ENCOUNTER — Encounter (HOSPITAL_BASED_OUTPATIENT_CLINIC_OR_DEPARTMENT_OTHER): Payer: Medicare Other | Admitting: Anesthesiology

## 2012-06-10 ENCOUNTER — Encounter (HOSPITAL_COMMUNITY): Payer: Self-pay | Admitting: Family

## 2012-06-10 HISTORY — PX: HX CATARACT REMOVAL: SHX102

## 2012-06-10 LAB — PERFORM POC WHOLE BLOOD GLUCOSE
GLUCOSE, POINT OF CARE: 216 mg/dL — ABNORMAL HIGH (ref 70–105)
GLUCOSE, POINT OF CARE: 203 mg/dL — ABNORMAL HIGH (ref 70–105)

## 2012-06-10 SURGERY — PHACO WITH INTRAOCULAR LENS
Anesthesia: Monitor Anesthesia Care | Site: Eye | Laterality: Right | Wound class: Clean Wound: Uninfected operative wounds in which no inflammation occurred

## 2012-06-10 MED ORDER — LIDOCAINE (PF) 3.5 % EYE GEL
2.0000 [drp] | Freq: Once | OPHTHALMIC | Status: DC | PRN
Start: 2012-06-10 — End: 2012-06-10
  Administered 2012-06-10: 2 [drp] via OPHTHALMIC

## 2012-06-10 MED ORDER — LIDOCAINE 2 % MUCOSAL JELLY IN APPLICATOR
10.0000 mL | Freq: Once | Status: DC | PRN
Start: 2012-06-10 — End: 2012-06-10
  Filled 2012-06-10: qty 20

## 2012-06-10 MED ORDER — PHENYLEPHRINE 2.5 % EYE DROPS
1.0000 [drp] | OPHTHALMIC | Status: AC
Start: 2012-06-10 — End: 2012-06-10
  Administered 2012-06-10 (×3): 1 [drp] via OPHTHALMIC

## 2012-06-10 MED ORDER — EPINEPHRINE HCL (PF) 1 MG/ML (1 ML) INJECTION SOLUTION
Freq: Once | INTRAOCULAR | Status: DC | PRN
Start: 2012-06-10 — End: 2012-06-10

## 2012-06-10 MED ORDER — MIDAZOLAM 1 MG/ML INJECTION SOLUTION
Freq: Once | INTRAMUSCULAR | Status: DC | PRN
Start: 2012-06-10 — End: 2012-06-10
  Administered 2012-06-10: 2 mg via INTRAVENOUS

## 2012-06-10 MED ORDER — TETRACAINE HCL (PF) 0.5 % EYE DROPS
1.0000 [drp] | OPHTHALMIC | Status: DC
Start: 2012-06-10 — End: 2012-06-10
  Administered 2012-06-10: 1 [drp] via OPHTHALMIC
  Filled 2012-06-10: qty 2

## 2012-06-10 MED ORDER — BALANCED SALT SOLUTION COMBINATION NO.2 INTRAOCULAR IRRIGATION
15.0000 mL | Freq: Once | INTRAOCULAR | Status: DC | PRN
Start: 2012-06-10 — End: 2012-06-10
  Administered 2012-06-10: 15 mL via OPHTHALMIC

## 2012-06-10 MED ORDER — LIDOCAINE (PF) 10 MG/ML (1 %) INJECTION SOLUTION
Freq: Once | INTRAMUSCULAR | Status: DC | PRN
Start: 2012-06-10 — End: 2012-06-10
  Administered 2012-06-10: 0.5 mL via EPIDURAL
  Filled 2012-06-10: qty 30

## 2012-06-10 MED ORDER — ACETAMINOPHEN 325 MG TABLET
650.00 mg | ORAL_TABLET | Freq: Four times a day (QID) | ORAL | Status: DC | PRN
Start: 2012-06-10 — End: 2012-06-10

## 2012-06-10 MED ORDER — CYCLOPENTOLATE 1 % EYE DROPS
1.0000 [drp] | OPHTHALMIC | Status: AC | PRN
Start: 2012-06-10 — End: 2012-06-10
  Administered 2012-06-10 (×3): 1 [drp] via OPHTHALMIC

## 2012-06-10 MED ORDER — ACETAMINOPHEN 325 MG TABLET
650.00 mg | ORAL_TABLET | ORAL | Status: DC | PRN
Start: 2012-06-10 — End: 2012-06-10

## 2012-06-10 MED ORDER — MOXIFLOXACIN 0.5 % EYE DROPS
1.0000 [drp] | Freq: Four times a day (QID) | OPHTHALMIC | Status: DC
Start: 2012-06-10 — End: 2012-07-05

## 2012-06-10 MED ORDER — MOXIFLOXACIN 0.5 % EYE DROPS
1.0000 [drp] | OPHTHALMIC | Status: AC
Start: 2012-06-10 — End: 2012-06-10
  Administered 2012-06-10 (×3): 1 [drp] via OPHTHALMIC
  Filled 2012-06-10: qty 3

## 2012-06-10 MED ORDER — PREDNISOLONE ACETATE 1 % EYE DROPS,SUSPENSION
1.00 [drp] | Freq: Four times a day (QID) | OPHTHALMIC | Status: DC
Start: 2012-06-10 — End: 2012-07-05

## 2012-06-10 MED ORDER — LACTATED RINGERS INTRAVENOUS SOLUTION
INTRAVENOUS | Status: DC
Start: 2012-06-10 — End: 2012-06-10
  Administered 2012-06-10: 0 via INTRAVENOUS

## 2012-06-10 MED ORDER — SODIUM CHLORIDE 0.9 % (FLUSH) INJECTION SYRINGE
2.00 mL | INJECTION | INTRAMUSCULAR | Status: DC | PRN
Start: 2012-06-10 — End: 2012-06-10

## 2012-06-10 MED ORDER — SODIUM CHLORIDE 0.9 % (FLUSH) INJECTION SYRINGE
2.00 mL | INJECTION | Freq: Three times a day (TID) | INTRAMUSCULAR | Status: DC
Start: 2012-06-10 — End: 2012-06-10

## 2012-06-10 MED ORDER — MOXIFLOXACIN 0.5 % EYE DROPS
1.00 [drp] | Freq: Four times a day (QID) | OPHTHALMIC | Status: DC
Start: 2012-06-10 — End: 2012-06-10

## 2012-06-10 SURGICAL SUPPLY — 10 items
CANNULA ANT CHMBR 27G X 7/8IN_585157 10EA/BX (CANNULA) ×1
CANNULA OPTH GRY 7/8IN 27GA VSTC 45D RND CORT HDSCT CLEAVE 11MM STRL LF (CANNULA) ×1 IMPLANT
DISCOELASTIC VISCOELASTIC 1ML 8065183710 (SUPP) ×2 IMPLANT
EXPANDER OPTH 6.25MM MALYUGIN RING PUPIL DISP INJECTOR STRL (OPHTHALMIC SUPPLIES (NOT LENS)) ×1 IMPLANT
EXPANDER OPTH 6.25MM MALYUGIN_RING PUPIL DISP INJECTOR STRL (OPTHALMIC SUPPLIES (NOT LENS)) ×1
LENS IOL 0 D +20 DIOP MOD L BI_CONVEX ACRYSOF NATURAL (Lens) ×1 IMPLANT
PACK CATARACT BX/6 (TRAY) ×2 IMPLANT
PACK SOLUTION COMBO_MPI006102 6EA/CS (SOLUTIONS) ×2 IMPLANT
SYRINGE BD .5IN 27GA 1ML LF  STRL REG BVL DTCH NEEDLE ST TB REG WL DISP GRY (NEEDLES & SYRINGE SUPPLIES) ×1 IMPLANT
SYRINGE BD PRCSNGL .5IN 27GA 1_ML LF STRL DTCH NEEDLE ST TB (NEEDLES & SYRINGE SUPPLIES) ×1

## 2012-06-10 NOTE — Discharge Summary (Signed)
Discharge Note  Patient meets discharge criteria  Discharge to home  Return to [facility] as scheduled or sooner if need be  Keep surgical dressing in place    Kathleen Argue, MD 06/10/2012, 12:36 PM

## 2012-06-10 NOTE — OR Nursing (Signed)
 CDE = 48.40  Duovisc 1.55ml to right eye by Dr. Georgina.

## 2012-06-10 NOTE — Discharge Instructions (Signed)
SURGICAL DISCHARGE INSTRUCTIONS     Dr. Moore, Charles, MD  performed your PHACO WITH INTRAOCULAR LENS today at the [facility]    [facility]:  Monday through Friday from 6 a.m. - 7 p.m.: [phone]  Between 7 p.m. - 6 a.m., weekends and holidays:  Call [facility] at [phone] or [phone].    PLEASE SEE WRITTEN HANDOUTS AS DISCUSSED BY YOUR NURSE:      SIGNS AND SYMPTOMS OF A WOUND / INCISION INFECTION   Be sure to watch for the following:   Increase in redness or red streaks near or around the wound or incision.   Increase in pain that is intense or severe and cannot be relieved by the pain medication that your doctor has given you.   Increase in swelling that cannot be relieved by elevation of a body part, or by applying ice, if permitted.   Increase in drainage, or if yellow / green in color and smells bad. This could be on a dressing or a cast.   Increase in fever for longer than 24 hours, or an increase that is higher than 101 degrees Fahrenheit (normal body temperature is 98 degrees Fahrenheit). The incision may feel warm to the touch.    **CALL YOUR DOCTOR IF ONE OR MORE OF THESE SIGNS / SYMPTOMS SHOULD OCCUR.    ANESTHESIA INFORMATION   ANESTHESIA -- ADULT PATIENTS:  You have received intravenous sedation / general anesthesia, and you may feel drowsy and light-headed for several hours. You may even experience some forgetfulness of the procedure. DO NOT DRIVE A MOTOR VEHICLE or perform any activity requiring complete alertness or coordination until you feel fully awake in about 24-48 hours. Do not drink alcoholic beverages for at least 24 hours. Do not stay alone, you must have a responsible adult available to be with you. You may also experience a dry mouth or nausea for 24 hours. This is a normal side effect and will disappear as the effects of the medication wear off.    REMEMBER   If you experience any difficulty breathing, chest pain, bleeding that you feel is  excessive, persistent nausea or vomiting or for any other concerns:  Call your physician Dr. Moore at [phone] or [phone]. You may also ask to have the Eye doctor on call paged. They are available to you 24 hours a day.    SPECIAL INSTRUCTIONS / COMMENTS    Begin eye drops the day of surgery.  You will be using two different bottles, one the hospital gives you, and the other will have a prescription.   Wash your hands before instilling the eye drops.   The order of the eye drops doesn't matter but it is best to wait 2-5 minutes between drops if using different types at the same time.  Shake bottles well before placing drops into the eye.   Protect your eye by wearing the eye shield or glasses.  When sleeping secure the shield over the eye with tape for one week.  EXCEPT to instill drops, keep the patch on constantly after surgery until you are seen in the clinic.   If crusting occurs on the eyelid use a cloth wet with warm water to soak it off.     Wipe from the nose toward the ear to remove crust.   Do not wipe both eyes with the same cloth, tissue, etc.   Some people are more comfortable wearing dark glasses to   limit light; however, they are not medically necessary.  The newer lenses have UV protection so this is up to you.   Normally vision will be blurry after the patch is removed and will gradually improve.   Keep your appointments.   You may shower and wash hair after your clinic visit.  Rinse hair while leaning head backwards.   A mild scratchy sensation may be present after surgery but will go away.   Tylenol can be taken but narcotics are not needed and aspirin should be avoided.   To pick up anything, stoop rather than bend at the waist.   Continue to still use drops in the opposite eye.    AVOID:   Do not bump eye.   Bending over, try to keep head above the level of your heart until you have been seen in the clinic.   Getting water in the eyes.  Do not dunk head under water.   Showering is fine.   No driving until you have seen Dr. Moore.    MEDICATIONS/PRESCRIPTIONS:   If you received 'local' (numbing with drugs) anesthetic, you should start using your prescribed eye drops today.  You should only remove the eye shield to apply drops then replace the shield.  You should instill the eye drops 3 times on the day of surgery and 4 times a day after that.   If you received a 'block' (numbing of the eye with a needle) anesthetic you should leave your eye shield in place until you see the doctor tomorrow.  Do not start drops until you have been seen in the clinic.   You should get your prescription eye drops filled and bring them to your appointment tomorrow so Dr. Moore can verify that you received the correct drops.    CALL YOUR PHYSICIAN IF YOU EXPERIENCE:   Any severe pain not relieved by Tylenol or Motrin   Any marked changes of vision   Fever greater than 101 degrees Fahrenheit    FOLLOW-UP APPOINTMENTS   Please call patient services at [phone] or [phone] to schedule a date / time of return. They are open Monday - Friday from 7:30 am - 5:00 pm.

## 2012-06-10 NOTE — Anesthesia Postprocedure Evaluation (Signed)
 ANESTHESIA POSTOP EVALUATION NOTE        Anesthesia Service      [place]  [facility]     06/10/2012     Last Vitals: Temperature: 36.6 C (97.9 F) (06/10/12 1300)  Heart Rate: 69  (06/10/12 1300)  BP (Non-Invasive): 127/59 mmHg (06/10/12 1300)  Respiratory Rate: 18  (06/10/12 1300)  SpO2-1: 96 % (06/10/12 1300)  Pain Score (Numeric, Faces): 0 (06/10/12 1300)    Procedure(s) Performed:  PHACO WITH INTRAOCULAR LENS - PT IS DIABETIC  Patient is sufficiently recovered from the effects of anesthesia to participate in the evaluation and has returned to their pre-procedure level.  I have reviewed and evaluated the following:  Respiratory Function: Consistent with pre anesthetic level  Cardiovascular Function: Consistent with pre anesthetic level  Mental Status: Return to pre anesthetic baseline level    Comment/ re-evaluation for any variations: None    The signing/co-signing faculty has examined the patient and agrees with the above details of the post-operative assessment.

## 2012-06-10 NOTE — OR Surgeon (Addendum)
[facility]   DEPARTMENT OF OPHTHALMOLOGY   OPERATION SUMMARY     PATIENT NAME: Christy Hale   HOSPITAL NUMBER: [id number]   DATE OF SERVICE: 06/10/2012   DATE OF BIRTH: 01-11-1941    PREOPERATIVE DIAGNOSIS: Cataract, right eye.   POSTOPERATIVE DIAGNOSIS: Cataract, right eye.     NAME OF PROCEDURE:   1.  Phacoemulsification of cataract with posterior chamber lens implant, right eye.   2.  Intravitreal injection of Avastin    SURGEONS: Kathleen Argue MD (staff), Madelyn Flavors MD (assistant).     ANESTHESIA: MAC, topical.   ESTIMATED BLOOD LOSS: Less than 1 drop.   COMPLICATIONS: There were no complications.   SPECIMENS: None.      DESCRIPTION OF PROCEDURE: In the preanesthesia area, the patient was given a topical anesthetic consisting of 3.5% lidocaine hydrochloride jelly applied to the right eye.  The patient was taken back to the operating room suite.  A surgical Time-Out confirmed the correct patient, procedure, and operative eye.  The eye was then prepped and draped in the usual sterile fashion. A speculum was placed in the eye. A disposable supersharp blade was used to make a 1-mm side port entry through clear cornea along the limbus, and 1 mL of 1% preservative-free Xylocaine was irrigated into the anterior chamber. The anterior chamber was filled with viscoelastic material. The keratome was used to make a 2.4-mm phaco port incision through clear cornea temporally along the limbus. A Malyugin ring was inserted to retract the iris. The cystotome and capsulorrhexis forceps were used to create a continuous curvilinear capsulorrhexis. Hydrodissection was performed with balanced salt solution on a cannula. The tip of the phacoemulsification handpiece was introduced into the eye, and the lens nucleus was emulsified using a bimanual nuclear splitting technique.  The phaco tip was removed and the tip of the I/A handpiece introduced into the eye. The remaining lens cortical material was aspirated from the eye. The I/A tip was removed, and the capsular bag was inflated with viscoelastic material. The posterior chamber lens implant, [id number], of 20.0 diopters power was brought onto the field and inspected. It was without defect and was loaded into the lens injector. The tip of the injector was inserted into the eye, and the lens implant was placed in the capsular bag.  The injector tip was removed.  The Malyugin ring was removed, and the tip of the I/A handpiece reintroduced into the eye. The remaining viscoelastic material was aspirated. The I/A tip was removed, and the anterior chamber was pressurized with balanced salt solution. The anterior  wound margins were hydrated with balanced salt solution.  Weck-cel sponges were gently brushed against the incision and confirmed that there was no wound leakage.  One mL of cefuroxime 1 mg/0.1 mL was injected through the side port into the anterior chamber.  A mark was made using calipers 3.5 mm from the limbus at 5 o'clock, and Avastin 0.05 mL was injected into the vitreous cavity.  The speculum was removed from the eye, and the eye was bandaged with a rigid shield. The patient was returned to same day surgery in excellent condition. Dr. Christell Constant was present for and participated in the entire procedure.     Madelyn Flavors, MD   Resident   [place] Department of Ophthalmology     Brett Fairy, MD   Assistant Professor   [facility] Department of Ophthalmology

## 2012-06-10 NOTE — Anesthesia Transfer of Care (Signed)
 ANESTHESIA TRANSFER OF CARE NOTE        Anesthesia Service      [place]  [facility]         Last Vitals: Temperature: 37 C (98.6 F) (06/10/12 1235)  Heart Rate: 68  (06/10/12 1235)  BP (Non-Invasive): 132/57 mmHg (06/10/12 1235)  Respiratory Rate: 18  (06/10/12 1235)  SpO2-1: 97 % (06/10/12 1235)    Patient transferred to area d in stable condition. Report given to RN.    8/22/2013at 12:35 PM.

## 2012-06-10 NOTE — Telephone Encounter (Signed)
Pt stated that she had CE today with Dr. Christell Constant and was supposed to take vigamox post op.  When she got home, the box she was given did not have the medication in it.  Called in medication to [facility].

## 2012-06-10 NOTE — H&P (Addendum)
 [facility]                                                     H&P UPDATE FORM    Patient:  Christy Hale  Med Record # [id number]  Date of Birth:   [birth year]  Age:  71 y.o.   Gender: female    Date of Admission: 06/10/2012    Outpatient Pre-Surgical H & P updated the day of the procedure.  1.I reviewed the H&P completed in 30 days of today's surgical procedure by Caresse JONELLE Daring, DO on  05/18/2012. Assessment remains unchanged based on completion of today's re-assessment.        Change in medications: No      Last Menstrual Period: No/not applicable      Comments:     2.  Patient continues to be appropiate candidate for planned surgical procedure. YES    Feliciano Toribio Knapp, MD 06/10/2012, 11:36 AM      I saw and examined the patient.  I reviewed the resident's note.  I agree with the findings and plan of care as documented in the resident's note.  Any exceptions/additions are edited/noted.    Carlin Ada, MD 06/10/2012, 12:07 PM

## 2012-06-10 NOTE — OR Surgeon (Signed)
Brief Operative Note    Pre-op diagnosis: NSC (Nuclear sclerotic) Cataract of right eye(s)    Post-op diagnosis: Same    Procedure: Phacoemulsification of cataract with lens implant right eye with Avastin(s).    Surgeons: Bing Neighbors. Christell Constant, MD & B. Elliot Gurney, MD    Anesthesia: Topical anesthesia with monitored anesthesia care (MAC)    Estimated Blood Loss: Less than 1 drop    Specimens: None    Complications: None  @OPHTHPI @      Kathleen Argue, MD 06/10/2012, 12:35 PM

## 2012-06-10 NOTE — OR PreOp (Signed)
Procedure: Phacoemulsification of cataract with lens implant right eye(s).    Pre-op diagnosis: Cataract right eye(s).      The patient has been thoroughly counseled as to the indications, alternatives, and potential risks of the procedures including, but not limited to, failure of operation, need for further surgery, scar, complications from anesthesia, loss of vision, infection, bleeding, corneal abrasion.    The patient understands the procedure with its risks and benefits, and desires to proceed as outlined above.    Kathleen Argue, MD 06/10/2012, 12:07 PM

## 2012-06-10 NOTE — Telephone Encounter (Signed)
 Message copied by RODOLPH BLUNT on Thu Jun 10, 2012  6:10 PM  ------       Message from: THEOPOLIS GARRE       Created: Thu Jun 10, 2012  5:48 PM         >> GARRE THEOPOLIS 06/10/2012 05:48 PM       Dr. RODOLPH,       Patient had a cataract removed today by Dr. Georgina. Patient was given Vigomox and the box is empty. Call connected, please advise.                     GARRE THEOPOLIS 06/10/2012, 5:46 PM

## 2012-06-11 ENCOUNTER — Ambulatory Visit: Payer: Medicare Other | Attending: Ophthalmology | Admitting: Ophthalmology

## 2012-06-11 ENCOUNTER — Encounter (INDEPENDENT_AMBULATORY_CARE_PROVIDER_SITE_OTHER): Payer: Self-pay | Admitting: Ophthalmology

## 2012-06-11 DIAGNOSIS — Z9849 Cataract extraction status, unspecified eye: Secondary | ICD-10-CM | POA: Insufficient documentation

## 2012-06-11 DIAGNOSIS — Z961 Presence of intraocular lens: Secondary | ICD-10-CM | POA: Insufficient documentation

## 2012-06-11 DIAGNOSIS — Z09 Encounter for follow-up examination after completed treatment for conditions other than malignant neoplasm: Secondary | ICD-10-CM | POA: Insufficient documentation

## 2012-06-11 DIAGNOSIS — Z87891 Personal history of nicotine dependence: Secondary | ICD-10-CM | POA: Insufficient documentation

## 2012-06-11 NOTE — Progress Notes (Signed)
[facility]  [facility]  Operated by [facility]  [address]  [place] [address]  Dept: [phone]    Patient Name: Christy Hale  MRN# [id number]    Date of Service: 06/11/2012    Chief Complaint:   Chief Complaint   Patient presents with   . Post-op #1     pt states she is doing a lot better. Pt states she had two blue dots in her vision yesterday but only one today and it is getting smaller. pt denies pain today. pt states she is seeing in the right eye today.       Past History  Current Outpatient Prescriptions   Medication Sig   . prednisoLONE acetate (PRED FORTE) 1 % Ophthalmic Drops, Suspension Instill 1 Drop into right eye Four times a day   . moxifloxacin (VIGAMOX) 0.5 % Ophthalmic Drops Instill 1 Drop into right eye Four times a day   . ferrous sulfate (FERATAB) 324 mg (65 mg iron) Oral Tablet, Delayed Release (E.C.) Take 324 mg by mouth Every morning before breakfast   . nitrofurantoin macrocrystal (MACRODANTIN) 50 mg Oral Capsule Take 1 Cap (50 mg total) by mouth Every night   . traMADol (ULTRAM) 50 mg Oral Tablet 50 mg Every 6 hours as needed One tab at bedtime   . cloNIDine (CATAPRES-TTS) 0.1 mg/24 hr Transdermal Patch Weekly    . furosemide (LASIX) 20 mg Oral Tablet 20 mg Once a day    . levothyroxine (SYNTHROID) 25 mcg Oral Tablet Take 25 mcg by mouth Once a day    . metoclopramide HCl (REGLAN) 5 mg Oral Tablet 5 mg Three times daily before meals    . KLOR-CON M20 20 mEq Oral Tab Sust.Rel. Particle/Crystal 20 mEq Once a day    . omeprazole (PRILOSEC) 20 mg Oral Capsule, Delayed Release(E.C.) Take 20 mg by mouth Once a day    . simvastatin (ZOCOR) 40 mg Oral Tablet 40 mg Once a day    . sennosides-docusate sodium (SENOKOT-S) 8.6-50 mg Oral Tablet take 1 Tab by mouth QPM.   . Chromium Picolinate 500 mcg Oral Capsule take 1 Tab by mouth Twice daily.   . INSULIN LISPRO (HUMALOG SUBQ) 5 Units by Subcutaneous route Twice daily On a sliding scale    . NIACIN, INOSITOL NIACINATE, ORAL take 500 mg by mouth Once a day.   . INSULIN GLARGINE,HUM.REC.ANLOG (LANTUS SUBQ) 40 Units by Subcutaneous route Once a day      No current facility-administered medications for this visit.     Facility-Administered Medications Ordered in Other Visits   Medication   . cyclopentolate (CYCLOGYL) 1% ophthalmic solution   . moxifloxacin (VIGAMOX) 0.5 % ophthalmic solution   . phenylephrine (AK-DILATE) 2.5% ophthalmic solution   . DISCONTD: acetaminophen (TYLENOL) tablet   . DISCONTD: acetaminophen (TYLENOL) tablet   . DISCONTD: tetracaine (PONTOCAINE) 0.5 % ophthalmic solution   . DISCONTD: balanced salt (BSS) ophthalmic solution   . DISCONTD: balanced salt (BSS) 500 mL with epinephrine (ADRENALIN) 1 mg/mL (1:1,000) (1mL) 1 mg intraoccular   . DISCONTD: lidocaine PF (XYLOCAINE-MPF) 1% injection   . DISCONTD: lidocaine 2% jelly uroject   . DISCONTD: NS flush syringe   . DISCONTD: NS flush syringe   . DISCONTD: LR premix infusion   . DISCONTD: lidocaine (AKTEN) 3.5 % ophthalmic gel   . DISCONTD: midazolam (VERSED) 1 mg/mL injection   . bevacizumab (AVASTIN) 1.25 mg/0.05 mL intravitreal   . DISCONTD: cefUROXime (ZINACEF) 1 mg/0.1 mL ophthalmic  injection     No Known Allergies  Past Medical History   Diagnosis Date   . Hypertension    . Ulcer disease    . Lung cancer      left lung, unknown type   . Arthritis    . Diabetes mellitus    . Hyperlipidemia    . History of transfusion      no reaction   . GERD (gastroesophageal reflux disease)      hx of, no longer takes meds   . Kidney disease      s/p nephrectomy due to cancer   . cancer      s/p renal cancer, lung cancer, skin cancer, and now with bladder cancer   . Shortness of breath      with humidity   . Seizures      x 3 2012 none since then   . Muscle weakness    . Urinary tract infection    . Type 2 diabetes mellitus      dx 20 + yrs ago, fasting up to 311 low during night   . Morbid obesity      Past Surgical History    Procedure Date   . Hx hysterectomy    . Hx hand surgery      bilateral   . Hx foot surgery      right   . Hx other      left arm surgery   . Hx nephrectomy      with ureterectomy (left) left nephrectomy   . Hx other      left lung cancer removal   . Hx laser eye surgery      laser OU for DM   . Hx cataract removal 06/10/2012     CE IOL OD w/avastin     Family History  Family History   Problem Relation Age of Onset   . Diabetes Mother    . Diabetes Father    . Hypertension Mother    . Hypertension Father    . Coronary Artery Disease Mother    . Lung Cancer Sister      smoker     Social History  History   Substance Use Topics   . Smoking status: Former Games developer   . Smokeless tobacco: Never Used    Comment: Pt quit smoking over 40 years, smoked 8 yrs   . Alcohol Use: Yes      wine rarely     Review of Systems  EYES:  (poor vision, eye pain, tearing, redness, etc): Positive  Comments: post op CE IOL OD  CONSTITUTIONAL:  (fever, weight loss/gain, fatigue, etc.): Negative  ENT:  ( hearing loss, earaches, stuffy nose, sinuses, teeth, gums, throat, etc.): Negative  CARDIOVASCULAR :  ( high blood pressure, abdnormal heart rate, heart attack, chest pain, high cholesterol, heart murmur or defect, etc.): Negative  RESPIRATORY:  (asthma, bronchitis, emphysema, shortness of breath, wheezing, cough, etc.): Negative  GASTROINTESTINAL:  ( stomach ulcers, heartburn, constipation, liver disease, diarrhea, jaundice, etc): Negative  UROGENITAL:   ( problems w/ urination, kidneys, prostate, kidney stones, dialysis, bladder,  etc.): Positive  HEM/LYMPH:  ( bleeding, anemia, bruising, cancer, sickle cell, swollen nodes, etc.): Positive  MUSCULO: ( muscle aches, joint pain, arthritis, fractures, etc.): Negative  INTEGUMENTARY: ( rashes, psoriasis, eczema, lumps,  etc.): Negative  NEUROLOGICAL: ( stroke, seizures, numbness, weakness, paralysis, delays, learning, speech, etc.): Negative  PSYCHIATRIC: ( anxiety, depression, ADD, etc.): Negative   ENDOCRINE: (diabetes, thyroid,  etc.): Positive  Comments: diabetes  ALL/IMM: (foods, pollens, infectious disease, etc.): Negative    Koren Shiver, COA 06/11/2012, 10:13 AM      Assessment: This patient has a satisfactory Post-op visit #1 following cataract extraction with PCIOL right eye. There is mild corneal edema. There is no elevation of the intraocular pressure..    Plan: Vigamox 1 drop QID to operative eye. Pred Forte 1 Drop QID to operatve eye. Patient instructed in proper drop usage. Patient advised to wear shield over operative eye at bedtime for one week. Return to clinic in approximately one week. Patient instructed to call [facility] with any change in vision, pain, or redness between now and next visit. Patient is to avoid activities that might result in bumping the eye and patient is not to immerse head under water. Showering is permitted.    I reviewed and made necessary changes to the technician, resident or fellow note regarding CC/HPI/ROS/Past Family, Medical and Social Hx/Surg Hx.  Kathleen Argue, MD 06/11/2012, 10:32 AM        Kathleen Argue, MD 06/11/2012, 10:32 AM

## 2012-06-11 NOTE — Patient Instructions (Signed)
Vigamox 1 drop 4 times daily to operative eye. Prednisolone 1 Drop 4 times daily to operatve eye.  Wait 2-3 minutes between drops. Wear shield over operative eye at bedtime for one week. Return to clinic in approximately one week. Call [facility] with any change in vision, pain, or redness between now and next visit. Avoid activities that might result in bumping the eye and do not immerse head under water. Showering is permitted.

## 2012-06-16 DIAGNOSIS — L723 Sebaceous cyst: Secondary | ICD-10-CM | POA: Diagnosis not present

## 2012-06-16 DIAGNOSIS — L570 Actinic keratosis: Secondary | ICD-10-CM | POA: Diagnosis not present

## 2012-06-16 DIAGNOSIS — Z85828 Personal history of other malignant neoplasm of skin: Secondary | ICD-10-CM | POA: Diagnosis not present

## 2012-06-16 DIAGNOSIS — L821 Other seborrheic keratosis: Secondary | ICD-10-CM | POA: Diagnosis not present

## 2012-06-18 ENCOUNTER — Ambulatory Visit: Payer: Medicare Other | Attending: Ophthalmology | Admitting: Ophthalmology

## 2012-06-18 ENCOUNTER — Encounter (INDEPENDENT_AMBULATORY_CARE_PROVIDER_SITE_OTHER): Payer: Self-pay | Admitting: Ophthalmology

## 2012-06-18 DIAGNOSIS — Z09 Encounter for follow-up examination after completed treatment for conditions other than malignant neoplasm: Secondary | ICD-10-CM | POA: Insufficient documentation

## 2012-06-18 DIAGNOSIS — H269 Unspecified cataract: Secondary | ICD-10-CM | POA: Insufficient documentation

## 2012-06-18 DIAGNOSIS — Z9849 Cataract extraction status, unspecified eye: Secondary | ICD-10-CM | POA: Insufficient documentation

## 2012-06-18 DIAGNOSIS — I10 Essential (primary) hypertension: Secondary | ICD-10-CM | POA: Insufficient documentation

## 2012-06-18 DIAGNOSIS — Z87891 Personal history of nicotine dependence: Secondary | ICD-10-CM | POA: Insufficient documentation

## 2012-06-18 DIAGNOSIS — Z961 Presence of intraocular lens: Secondary | ICD-10-CM | POA: Insufficient documentation

## 2012-06-18 NOTE — Progress Notes (Signed)
Healthcare  [facility]  Operated by [facility]  [address]  [place] [address]  Dept: [phone]    Patient Name: Christy Hale  MRN# [id number]    Date of Service: 06/18/2012    Chief Complaint:   Chief Complaint   Patient presents with   . Post-op #2     1 wk s/p CEIOL OD. Pt states vision improved. NO eye pain, no floaters or flashes. Pt using drops as directed.        Past History  Current Outpatient Prescriptions   Medication Sig   . prednisoLONE acetate (PRED FORTE) 1 % Ophthalmic Drops, Suspension Instill 1 Drop into right eye Four times a day   . moxifloxacin (VIGAMOX) 0.5 % Ophthalmic Drops Instill 1 Drop into right eye Four times a day   . ferrous sulfate (FERATAB) 324 mg (65 mg iron) Oral Tablet, Delayed Release (E.C.) Take 324 mg by mouth Every morning before breakfast   . nitrofurantoin macrocrystal (MACRODANTIN) 50 mg Oral Capsule Take 1 Cap (50 mg total) by mouth Every night   . traMADol (ULTRAM) 50 mg Oral Tablet 50 mg Every 6 hours as needed One tab at bedtime   . cloNIDine (CATAPRES-TTS) 0.1 mg/24 hr Transdermal Patch Weekly    . furosemide (LASIX) 20 mg Oral Tablet 20 mg Once a day    . levothyroxine (SYNTHROID) 25 mcg Oral Tablet Take 25 mcg by mouth Once a day    . metoclopramide HCl (REGLAN) 5 mg Oral Tablet 5 mg Three times daily before meals    . KLOR-CON M20 20 mEq Oral Tab Sust.Rel. Particle/Crystal 20 mEq Once a day    . omeprazole (PRILOSEC) 20 mg Oral Capsule, Delayed Release(E.C.) Take 20 mg by mouth Once a day    . simvastatin (ZOCOR) 40 mg Oral Tablet 40 mg Once a day    . sennosides-docusate sodium (SENOKOT-S) 8.6-50 mg Oral Tablet take 1 Tab by mouth QPM.   . Chromium Picolinate 500 mcg Oral Capsule take 1 Tab by mouth Twice daily.   . INSULIN LISPRO (HUMALOG SUBQ) 5 Units by Subcutaneous route Twice daily On a sliding scale   . NIACIN, INOSITOL NIACINATE, ORAL take 500 mg by mouth Once a day.    . INSULIN GLARGINE,HUM.REC.ANLOG (LANTUS SUBQ) 40 Units by Subcutaneous route Once a day      No Known Allergies  Past Medical History   Diagnosis Date   . Hypertension    . Ulcer disease    . Lung cancer      left lung, unknown type   . Arthritis    . Diabetes mellitus    . Hyperlipidemia    . History of transfusion      no reaction   . GERD (gastroesophageal reflux disease)      hx of, no longer takes meds   . Kidney disease      s/p nephrectomy due to cancer   . cancer      s/p renal cancer, lung cancer, skin cancer, and now with bladder cancer   . Shortness of breath      with humidity   . Seizures      x 3 2012 none since then   . Muscle weakness    . Urinary tract infection    . Type 2 diabetes mellitus      dx 20 + yrs ago, fasting up to 311 low during night   . Morbid obesity  Past Surgical History   Procedure Date   . Hx hysterectomy    . Hx hand surgery      bilateral   . Hx foot surgery      right   . Hx other      left arm surgery   . Hx nephrectomy      with ureterectomy (left) left nephrectomy   . Hx other      left lung cancer removal   . Hx laser eye surgery      laser OU for DM   . Hx cataract removal 06/10/2012     CE IOL OD w/avastin     Family History  Family History   Problem Relation Age of Onset   . Diabetes Mother    . Diabetes Father    . Hypertension Mother    . Hypertension Father    . Coronary Artery Disease Mother    . Lung Cancer Sister      smoker     Social History  History   Substance Use Topics   . Smoking status: Former Games developer   . Smokeless tobacco: Never Used    Comment: Pt quit smoking over 40 years, smoked 8 yrs   . Alcohol Use: Yes      wine rarely     Review of Systems  EYES:  (poor vision, eye pain, tearing, redness, etc): Positive  CONSTITUTIONAL:  (fever, weight loss/gain, fatigue, etc.): Negative  ENT:  ( hearing loss, earaches, stuffy nose, sinuses, teeth, gums, throat, etc.): Negative   CARDIOVASCULAR :  ( high blood pressure, abdnormal heart rate, heart attack, chest pain, high cholesterol, heart murmur or defect, etc.): Negative  RESPIRATORY:  (asthma, bronchitis, emphysema, shortness of breath, wheezing, cough, etc.): Negative  GASTROINTESTINAL:  ( stomach ulcers, heartburn, constipation, liver disease, diarrhea, jaundice, etc): Negative  UROGENITAL:   ( problems w/ urination, kidneys, prostate, kidney stones, dialysis, bladder,  etc.): Negative  HEM/LYMPH:  ( bleeding, anemia, bruising, cancer, sickle cell, swollen nodes, etc.): Negative  MUSCULO: ( muscle aches, joint pain, arthritis, fractures, etc.): Negative  INTEGUMENTARY: ( rashes, psoriasis, eczema, lumps,  etc.): Negative  NEUROLOGICAL: ( stroke, seizures, numbness, weakness, paralysis, delays, learning, speech, etc.): Negative  PSYCHIATRIC: ( anxiety, depression, ADD, etc.): Negative  ENDOCRINE: (diabetes, thyroid, etc.): Positive  Comments: DM, BS 240 this am  ALL/IMM: (foods, pollens, infectious disease, etc.): Negative  All others negative: yes    Nolon Bussing, COA 06/18/2012, 10:12 AM      Assessment: This patient has satisfactory post-op visit #2 following cataract extraction with lens implant of right eye.    Plan: Discontinue Vigamox eye drops. Taper Pred Forte as follows: 1 drop to operative eye TID x 1 week, then BID x week, then Q Day x week, then stop. Discontinue eye shield at bedtime.     I reviewed and made necessary changes to the technician, resident or fellow note regarding CC/HPI/ROS/Past Family, Medical and Social Hx/Surg Hx.  Thedore Mins, MD 06/18/2012, 10:32 AM      Thedore Mins, MD 06/18/2012, 10:32 AM      HPI: Visual complaints, blurry    Assessment: Visual significant cataract left eye.     Plan: Risks, benefits, and nature of cataract extraction with lens implant reviewed and patient consents to surgery. Plan topical anesthesia with Monitored Anesthesia Care (MAC). Patient to see surgery coordinator today to schedule surgery.  Special needs: Avastin    I reviewed and made necessary changes to  the technician, resident or fellow note regarding CC/HPI/ROS/Past Family, Medical and Social Hx/Surg Hx.  Thedore Mins, MD 06/18/2012, 10:33 AM      Thedore Mins, MD 06/18/2012, 10:33 AM        I reviewed and made necessary changes to the technician, resident or fellow note regarding CC/HPI/ROS/Past Family, Medical and Social Hx/Surg Hx.  Kathleen Argue, MD 06/18/2012, 10:42 AM      I saw and examined the patient.  I reviewed the resident's note.  I agree with the findings and plan of care as documented in the resident's note.  Any exceptions/additions are edited/noted.    Kathleen Argue, MD 06/18/2012, 10:42 AM

## 2012-06-18 NOTE — Patient Instructions (Signed)
Discontinue Vigamox eye drops. Taper prednisolone as follows: 1 drop to operative eye 3x daily for1 week, then 2x daily for 1 week, then once daily for 1 week, then stop. Discontinue eye shield at bedtime. No restrictions on activity. Return to clinic in 3-4 weeks.

## 2012-06-30 ENCOUNTER — Inpatient Hospital Stay (HOSPITAL_BASED_OUTPATIENT_CLINIC_OR_DEPARTMENT_OTHER)
Admission: EM | Admit: 2012-06-30 | Discharge: 2012-06-30 | Disposition: A | Discharge status: Home / Self Care | Payer: Medicare Other

## 2012-07-05 ENCOUNTER — Ambulatory Visit
Admission: RE | Admit: 2012-07-05 | Discharge: 2012-07-05 | Disposition: A | Discharge status: Home / Self Care | Payer: Medicare Other | Source: Ambulatory Visit

## 2012-07-06 ENCOUNTER — Ambulatory Visit
Admission: RE | Admit: 2012-07-06 | Discharge: 2012-07-06 | Disposition: A | Discharge status: Home / Self Care | Payer: Medicare Other | Source: Ambulatory Visit

## 2012-07-06 ENCOUNTER — Encounter (HOSPITAL_COMMUNITY): Payer: Self-pay

## 2012-07-06 NOTE — Anesthesia Preprocedure Evaluation (Addendum)
Airway       Mallampati: III    TM distance: >3 FB    Neck ROM: full  Mouth Opening: good.      No endotracheal tube present  No Tracheostomy present    Dental           (+) upper dentures and lower dentures           Pulmonary    Breath sounds clear to auscultation    (-) no rhonchi, no decreased breath sounds, no wheezes, no rales, no stridor Cardiovascular    Rhythm: regular  Rate: Normal  (-) no friction rub and no murmur     Other findings                Planned anesthesia type: MAC  ASA 3        Patient's NPO status is appropriate for Anesthesia.      Anesthetic plan and risks discussed with patient.    Anesthesia issues/risks discussed are: Intraoperative Awareness/ Recall.  daughter        Plan discussed with CRNA.    (Pt. Understands and accepts Risks & Benefits of MAC.  Pt. Wishes to Proceed.)    The Anesthesia Plan above was formulated based on the history, results, and physical exam performed immediately prior to the procedure/surgery.          EKG: Within last six months on chart. 06/01/12.   CXR: In last year on chart. 06/01/12.  Other Studies: Labs on chart. 06/03/12.  Consults: Pre-op H&P on chart.     STOP BANG Score (0-8):     Patient instructed to take the following medications day of surgery: levothyroxine, omeprazole.    Pt instructed to take 1/2 dose Lantus (25 units) night before.  Pt instructed to cover with humalog am of surgery if glucose greater than 200 per our sliding scale.  Pt given sliding scale to use am of surgery.

## 2012-07-12 MED ORDER — CEFUROXIME 1 MG/0.1 ML OPHTH INJ
1.0000 mg | INJECTION | Freq: Once | INTRACAMERAL | Status: DC | PRN
Start: 2012-07-13 — End: 2012-07-13
  Administered 2012-07-13: 1 mg via INTRACAMERAL
  Filled 2012-07-12: qty 0.1

## 2012-07-12 MED ORDER — BEVACIZUMAB 1.25 MG/ 0.05 ML INTRAVITREAL INJ.
1.2500 mg | INJECTION | Freq: Once | INTRAVITREAL | Status: AC
Start: 2012-07-13 — End: 2012-07-13
  Administered 2012-07-13: 1.25 mg via INTRAVITREAL
  Filled 2012-07-12: qty 0.05

## 2012-07-13 ENCOUNTER — Encounter (HOSPITAL_COMMUNITY): Payer: Self-pay | Admitting: Family

## 2012-07-13 ENCOUNTER — Ambulatory Visit (HOSPITAL_BASED_OUTPATIENT_CLINIC_OR_DEPARTMENT_OTHER): Payer: Medicare Other | Admitting: Ophthalmology

## 2012-07-13 ENCOUNTER — Encounter (HOSPITAL_BASED_OUTPATIENT_CLINIC_OR_DEPARTMENT_OTHER): Payer: Medicare Other | Admitting: Anesthesiology

## 2012-07-13 ENCOUNTER — Encounter (HOSPITAL_COMMUNITY): Payer: Self-pay

## 2012-07-13 ENCOUNTER — Inpatient Hospital Stay
Admission: RE | Admit: 2012-07-13 | Discharge: 2012-07-13 | Disposition: A | Discharge status: Home / Self Care | Payer: Medicare Other | Source: Ambulatory Visit | Attending: Ophthalmology | Admitting: Ophthalmology

## 2012-07-13 ENCOUNTER — Encounter (HOSPITAL_COMMUNITY)
Admission: RE | Disposition: A | Discharge status: Home / Self Care | Payer: Self-pay | Source: Ambulatory Visit | Attending: Ophthalmology

## 2012-07-13 DIAGNOSIS — H2510 Age-related nuclear cataract, unspecified eye: Secondary | ICD-10-CM | POA: Insufficient documentation

## 2012-07-13 HISTORY — PX: HX CATARACT REMOVAL: SHX102

## 2012-07-13 LAB — PERFORM POC WHOLE BLOOD GLUCOSE: GLUCOSE, POINT OF CARE: 199 mg/dL — ABNORMAL HIGH (ref 70–105)

## 2012-07-13 SURGERY — PHACO WITH INTRAOCULAR LENS
Anesthesia: Monitor Anesthesia Care | Site: Eye | Laterality: Left | Wound class: Clean Wound: Uninfected operative wounds in which no inflammation occurred

## 2012-07-13 MED ORDER — BALANCED SALT SOLUTION COMBINATION NO.2 INTRAOCULAR IRRIGATION
15.0000 mL | Freq: Once | INTRAOCULAR | Status: DC | PRN
Start: 2012-07-13 — End: 2012-07-13
  Administered 2012-07-13: 15 mL via OPHTHALMIC

## 2012-07-13 MED ORDER — LIDOCAINE (PF) 3.5 % EYE GEL
2.0000 [drp] | Freq: Once | OPHTHALMIC | Status: DC | PRN
Start: 2012-07-13 — End: 2012-07-13
  Administered 2012-07-13: 2 [drp] via OPHTHALMIC

## 2012-07-13 MED ORDER — MOXIFLOXACIN 0.5 % EYE DROPS
1.00 [drp] | Freq: Four times a day (QID) | OPHTHALMIC | Status: DC
Start: 2012-07-13 — End: 2012-08-24

## 2012-07-13 MED ORDER — SODIUM CHLORIDE 0.9 % (FLUSH) INJECTION SYRINGE
2.00 mL | INJECTION | Freq: Three times a day (TID) | INTRAMUSCULAR | Status: DC
Start: 2012-07-13 — End: 2012-07-13

## 2012-07-13 MED ORDER — SODIUM CHLORIDE 0.9 % (FLUSH) INJECTION SYRINGE
2.00 mL | INJECTION | INTRAMUSCULAR | Status: DC | PRN
Start: 2012-07-13 — End: 2012-07-13

## 2012-07-13 MED ORDER — MOXIFLOXACIN 0.5 % EYE DROPS
1.0000 [drp] | OPHTHALMIC | Status: AC
Start: 2012-07-13 — End: 2012-07-13
  Administered 2012-07-13 (×3): 1 [drp] via OPHTHALMIC
  Filled 2012-07-13: qty 3

## 2012-07-13 MED ORDER — LACTATED RINGERS INTRAVENOUS SOLUTION
INTRAVENOUS | Status: DC
Start: 2012-07-13 — End: 2012-07-13

## 2012-07-13 MED ORDER — LIDOCAINE (PF) 10 MG/ML (1 %) INJECTION SOLUTION
2.0000 mL | Freq: Once | INTRAMUSCULAR | Status: DC | PRN
Start: 2012-07-13 — End: 2012-07-13
  Administered 2012-07-13: 0.5 mL via INTRAMUSCULAR
  Filled 2012-07-13: qty 2

## 2012-07-13 MED ORDER — EPINEPHRINE HCL (PF) 1 MG/ML (1 ML) INJECTION SOLUTION
Freq: Once | INTRAMUSCULAR | Status: DC | PRN
Start: 2012-07-13 — End: 2012-07-13

## 2012-07-13 MED ORDER — PREDNISOLONE ACETATE 1 % EYE DROPS,SUSPENSION
1.00 [drp] | Freq: Four times a day (QID) | OPHTHALMIC | Status: DC
Start: 2012-07-13 — End: 2012-08-30

## 2012-07-13 MED ORDER — TETRACAINE HCL (PF) 0.5 % EYE DROPS
1.0000 [drp] | Freq: Once | OPHTHALMIC | Status: DC | PRN
Start: 2012-07-13 — End: 2012-07-13
  Administered 2012-07-13: 1 [drp] via OPHTHALMIC
  Filled 2012-07-13: qty 2

## 2012-07-13 MED ORDER — CYCLOPENTOLATE 1 % EYE DROPS
1.0000 [drp] | OPHTHALMIC | Status: AC | PRN
Start: 2012-07-13 — End: 2012-07-13
  Administered 2012-07-13 (×3): 1 [drp] via OPHTHALMIC

## 2012-07-13 MED ORDER — ACETAMINOPHEN 325 MG TABLET
650.00 mg | ORAL_TABLET | Freq: Four times a day (QID) | ORAL | Status: DC | PRN
Start: 2012-07-13 — End: 2012-07-13
  Filled 2012-07-13: qty 2

## 2012-07-13 MED ORDER — PHENYLEPHRINE 2.5 % EYE DROPS
1.0000 [drp] | OPHTHALMIC | Status: AC
Start: 2012-07-13 — End: 2012-07-13
  Administered 2012-07-13 (×3): 1 [drp] via OPHTHALMIC

## 2012-07-13 MED ORDER — LACTATED RINGERS INTRAVENOUS SOLUTION
INTRAVENOUS | Status: DC | PRN
Start: 2012-07-13 — End: 2012-07-13

## 2012-07-13 MED ORDER — MIDAZOLAM 1 MG/ML INJECTION SOLUTION
Freq: Once | INTRAMUSCULAR | Status: DC | PRN
Start: 2012-07-13 — End: 2012-07-13
  Administered 2012-07-13: 2 mg via INTRAVENOUS

## 2012-07-13 MED ORDER — ACETAMINOPHEN 325 MG TABLET
650.00 mg | ORAL_TABLET | ORAL | Status: DC | PRN
Start: 2012-07-13 — End: 2012-07-13
  Filled 2012-07-13: qty 2

## 2012-07-13 SURGICAL SUPPLY — 7 items
CANNULA OPTH GRY 7/8IN 27GA VSTC 45D RND CORT HDSCT CLEAVE 11MM STRL LF (CANNULA) ×1 IMPLANT
EXPANDER OPTH 6.25MM MALYUGIN RING PUPIL DISP INJECTOR STRL (OPHTHALMIC SUPPLIES (NOT LENS)) ×1 IMPLANT
LENS IOL 0 D +20.5 DIOP MOD L_BICONVEX ACRYSOF NATURAL (Lens) ×1 IMPLANT
PACK CATARACT BX/6 (TRAY) ×1 IMPLANT
PACK SOLUTION COMBO_MPI006102 6EA/CS (SOLUTIONS) ×1 IMPLANT
SYRINGE 1ML LF  STRL ST TB DISP (NEEDLES & SYRINGE SUPPLIES) ×1 IMPLANT
SYRINGE BD .5IN 27GA 1ML LF  STRL REG BVL DTCH NEEDLE ST TB REG WL DISP GRY (NEEDLES & SYRINGE SUPPLIES) ×1 IMPLANT

## 2012-07-13 NOTE — OR Surgeon (Signed)
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    Surgeons: Bing Neighbors. Christell Constant, MD & C. Groat, MD    Anesthesia: Topical anesthesia with monitored anesthesia care (MAC)    Estimated Blood Loss: Less than 1 drop    Specimens: None    Complications: None  @OPHTHPI @      Christy Argue, MD 07/13/2012, 3:04 PM

## 2012-07-13 NOTE — Discharge Summary (Signed)
 Discharge Note  Patient meets discharge criteria  Discharge to home  Return to [facility] as scheduled or sooner if need be  Keep surgical dressing in place    Carlin Ada, MD 07/13/2012, 3:05 PM

## 2012-07-13 NOTE — H&P (Addendum)
[facility]  [facility]  History and Physical      Date:  07/13/2012   Christy Hale, 71 y.o. female  Date of Birth:  1941-09-30      Chief Complaint:   Cataract, left eye    HPI: Christy Hale is a 72 y.o., female who presents with cataract, left eye. Pt feels like her usual self. No CP or SOB. No complaints.         Past Medical History   Diagnosis Date   . Hypertension    . Ulcer disease    . Lung cancer      left lung, unknown type   . Arthritis    . Diabetes mellitus    . Hyperlipidemia    . History of transfusion      no reaction   . GERD (gastroesophageal reflux disease)      hx of, no longer takes meds   . Kidney disease      s/p nephrectomy due to cancer   . cancer      s/p renal cancer, lung cancer, skin cancer, and now with bladder cancer   . Shortness of breath      with humidity   . Seizures      x 3 2012 none since then   . Muscle weakness    . Urinary tract infection    . Type 2 diabetes mellitus      dx 20 + yrs ago, fasting up to 311 low during night   . Morbid obesity      No Known Allergies  Current Facility-Administered Medications   Medication Status Dose Route Frequency Provider Last Rate Last Dose   . acetaminophen (TYLENOL) tablet Active  650 mg Oral Q4H PRN Kathleen Argue, MD       . acetaminophen (TYLENOL) tablet Active  650 mg Oral Q6H PRN Kathleen Argue, MD       . cyclopentolate (CYCLOGYL) 1% ophthalmic solution Completed  1 Drop Left Eye PRE-OP Q5 Min PRN Kathleen Argue, MD   1 Drop at 07/13/12 1315   . moxifloxacin (VIGAMOX) 0.5 % ophthalmic solution Completed  1 Drop Left Eye PRE-OP Q20 Susa Griffins, MD   1 Drop at 07/13/12 1315   . phenylephrine (AK-DILATE) 2.5% ophthalmic solution Completed  1 Drop Left Eye PRE-OP Q5 Susa Griffins, MD   1 Drop at 07/13/12 1315   . balanced salt (BSS) ophthalmic solution Active  15 mL Both Eyes INTRA-OP Once PRN Kathleen Argue, MD        . balanced salt (BSS) 500 mL with epinephrine (ADRENALIN) 1 mg/mL (1:1,000) (1mL) 1 mg intraoccular Active   Intraocular INTRA-OP Once PRN Kathleen Argue, MD       . lidocaine PF (XYLOCAINE-MPF) 1% injection Active  2 mL Injection INTRA-OP Once PRN Kathleen Argue, MD       . lidocaine (AKTEN) 3.5 % ophthalmic gel Active  2 Drop Both Eyes INTRA-OP Once PRN Kathleen Argue, MD       . tetracaine (PONTOCAINE) 0.5 % ophthalmic solution Active  1 Drop Both Eyes INTRA-OP Once PRN Kathleen Argue, MD       . NS flush syringe Active  2 mL Intracatheter Collene Leyden, MD        And   . NS flush syringe Active  2-6 mL Intracatheter Q1 MIN PRN Kathleen Argue, MD       . Georgina Snell premix infusion Active   Intravenous Continuous Kathleen Argue,  MD       . bevacizumab (AVASTIN) 1.25 mg/0.05 mL intravitreal Active  1.25 mg INTRAVITREAL INTRA-OP Once Kathleen Argue, MD       . cefUROXime (ZINACEF) 1 mg/0.1 mL ophthalmic injection Active  1 mg Intracameral INTRA-OP Once PRN Kathleen Argue, MD          History   Substance Use Topics   . Smoking status: Former Smoker -- 3.00 packs/day for 15 years     Types: Cigarettes     Quit date: 10/20/1981   . Smokeless tobacco: Never Used   . Alcohol Use: Yes      wine rarely     Family History   Problem Relation Age of Onset   . Diabetes Mother    . Diabetes Father    . Hypertension Mother    . Hypertension Father    . Coronary Artery Disease Mother    . Lung Cancer Sister      smoker       ROS: Other than ROS in the HPI, all other systems were negative.    EXAM:    General: moderately obese  Eyes: Performed at [facility].    HENT:Head atraumatic and normocephalic  Neck: supple, symmetrical, trachea midline  Lungs: Slightly diminished OU  Cardiovascular: regular rate and rhythm  Abdomen: Soft, non-tender  Extremities: No cyanosis or edema    ASSESSMENT/PLAN:    Proceed to OR    Sallye Lat, MD 07/13/2012, 1:16 PM     I saw and examined the patient.  I reviewed the resident's note.  I agree with the findings and plan of care as documented in the resident's note.  Any exceptions/additions are edited/noted.    Kathleen Argue, MD 07/13/2012, 2:27 PM

## 2012-07-13 NOTE — OR PostOp (Signed)
 Script for pred forte  given to daughter.

## 2012-07-13 NOTE — Anesthesia Transfer of Care (Signed)
 ANESTHESIA TRANSFER OF CARE NOTE        Anesthesia Service      [facility]         Last Vitals: Temperature: 36.6 C (97.9 F) (07/13/12 1506)  Heart Rate: 71  (07/13/12 1506)  BP (Non-Invasive): 127/70 mmHg (07/13/12 1506)  Respiratory Rate: 12  (07/13/12 1506)  SpO2-1: 98 % (07/13/12 1506)    Patient transferred to *d** in stable condition. Report given to RN.    9/24/2013at 3:07 PM.

## 2012-07-13 NOTE — Discharge Instructions (Signed)
SURGICAL DISCHARGE INSTRUCTIONS     Dr. Moore, Charles, MD  performed your PHACO WITH INTRAOCULAR LENS, INJECTION MEDICATION EYE today at the [facility]    [facility]:  Monday through Friday from 6 a.m. - 7 p.m.: [phone]  Between 7 p.m. - 6 a.m., weekends and holidays:  Call [facility] at [phone] or [phone].    PLEASE SEE WRITTEN HANDOUTS AS DISCUSSED BY YOUR NURSE:      SIGNS AND SYMPTOMS OF A WOUND / INCISION INFECTION   Be sure to watch for the following:  Increase in redness or red streaks near or around the wound or incision.  Increase in pain that is intense or severe and cannot be relieved by the pain medication that your doctor has given you.  Increase in swelling that cannot be relieved by elevation of a body part, or by applying ice, if permitted.  Increase in drainage, or if yellow / green in color and smells bad. This could be on a dressing or a cast.  Increase in fever for longer than 24 hours, or an increase that is higher than 101 degrees Fahrenheit (normal body temperature is 98 degrees Fahrenheit). The incision may feel warm to the touch.    **CALL YOUR DOCTOR IF ONE OR MORE OF THESE SIGNS / SYMPTOMS SHOULD OCCUR.    ANESTHESIA INFORMATION   ANESTHESIA -- ADULT PATIENTS:  You have received intravenous sedation / general anesthesia, and you may feel drowsy and light-headed for several hours. You may even experience some forgetfulness of the procedure. DO NOT DRIVE A MOTOR VEHICLE or perform any activity requiring complete alertness or coordination until you feel fully awake in about 24-48 hours. Do not drink alcoholic beverages for at least 24 hours. Do not stay alone, you must have a responsible adult available to be with you. You may also experience a dry mouth or nausea for 24 hours. This is a normal side effect and will disappear as the effects of the medication wear off.    REMEMBER   If you experience any difficulty breathing, chest pain, bleeding  that you feel is excessive, persistent nausea or vomiting or for any other concerns:  Call your physician Dr. Moore at [phone] or [phone]. You may also ask to have the EYE doctor on call paged. They are available to you 24 hours a day.    SPECIAL INSTRUCTIONS / COMMENTS       FOLLOW-UP APPOINTMENTS   Please call patient services at [phone] or [phone] to schedule a date / time of return. They are open Monday - Friday from 7:30 am - 5:00 pm.

## 2012-07-13 NOTE — OR PreOp (Signed)
Procedure: Phacoemulsification of cataract with lens implant left eye(s).    Pre-op diagnosis: Cataract left eye(s).      The patient has been thoroughly counseled as to the indications, alternatives, and potential risks of the procedures including, but not limited to, failure of operation, need for further surgery, scar, complications from anesthesia, loss of vision, infection, bleeding, corneal abrasion.    The patient understands the procedure with its risks and benefits, and desires to proceed as outlined above.    Kathleen Argue, MD 07/13/2012, 2:27 PM      Procedure: Phacoemulsification of cataract with lens implant left eye(s).    Pre-op diagnosis: Cataract left eye(s).      The patient has been thoroughly counseled as to the indications, alternatives, and potential risks of the procedures including, but not limited to, failure of operation, need for further surgery, scar, complications from anesthesia, loss of vision, infection, bleeding, corneal abrasion.    The patient understands the procedure with its risks and benefits, and desires to proceed as outlined above.    Kathleen Argue, MD 07/13/2012, 2:27 PM

## 2012-07-13 NOTE — OR Surgeon (Addendum)
 [place]  [facility]   DEPARTMENT OF OPHTHALMOLOGY   OPERATION SUMMARY     PATIENT NAME: Christy Hale   HOSPITAL NUMBER: [id number]   DATE OF SERVICE: 07/13/2012   DATE OF BIRTH: 1941-03-19    PREOPERATIVE DIAGNOSIS: Cataract, left eye.   POSTOPERATIVE DIAGNOSIS: Cataract, left eye.     NAME OF PROCEDURE: Phacoemulsification of cataract with posterior chamber lens implant, left eye.     SURGEONS: Carlin Ada MD (staff), Medford Gaudy MD (assistant).     ANESTHESIA: MAC, topical.   ESTIMATED BLOOD LOSS: Less than 2 mL.   COMPLICATIONS: There were no complications.   SPECIMENS: None.     DESCRIPTION OF PROCEDURE: In the preanesthesia area, the patient was given a topical anesthetic consisting of 3.5% lidocaine  hydrochloride jelly applied to the left eye. The patient was taken back to the operating room suite. A surgical Time-Out confirmed the correct patient, procedure, and operative eye. The eye was then prepped and draped in the usual sterile fashion. A speculum was placed in the eye. A disposable supersharp blade was used to make a 1-mm side port entry through clear cornea along the limbus, and 1 mL of 1% preservative-free Xylocaine  was irrigated into the anterior chamber. The anterior chamber was filled with viscoelastic material. The keratome was used to make a 2.4-mm phaco port incision through clear cornea temporally along the limbus. A Malyugin ring was injected into the anterior chamber and carefully put into place. The cystotome and capsulorrhexis forceps were used to create a continuous curvilinear capsulorrhexis. Hydrodissection was performed with balanced salt  solution on a cannula. The tip of the phacoemulsification handpiece was introduced into the eye, and the lens nucleus was emulsified using a bimanual nuclear splitting technique.  The phaco tip was removed and the tip of the I/A handpiece introduced into the eye. The remaining lens cortical material was aspirated from the eye. The  I/A tip was removed, and the capsular bag was inflated with viscoelastic material. The posterior chamber lens implant, [id number], of 20.5 diopters power was brought onto the field and inspected. It was without defect and was loaded into the lens injector. The tip of the injector was inserted into the eye, and the lens implant was placed in the capsular bag. The injector tip was removed. The Malyugin ring was removed. The tip of the I/A handpiece reintroduced into the eye. The remaining viscoelastic material was aspirated. The I/A tip was removed, and the anterior chamber was pressurized with balanced salt  solution. The anterior wound margins were hydrated with balanced salt  solution. 0.1 mL of cefuroxime  1 mg/0.1 mL was injected through the side port into the anterior chamber. Weck-cel sponges were gently brushed against the incision and confirmed that there was no wound leakage. Then an intravitreal injection was administered 3.85mm from the limbus consisting of 0.16mL of 1.25mg /0.61mL Avastin . The speculum was removed from the eye, and the eye was bandaged with a rigid shield. The patient was returned to same day surgery in excellent condition. Dr. Ada was present for and participated in the entire procedure.     Medford Gaudy, MD   Resident   [place] Department of Ophthalmology     Carlin DELENA Ada, MD   Assistant Professor   [facility] Department of Ophthalmology

## 2012-07-13 NOTE — Anesthesia Postprocedure Evaluation (Signed)
ANESTHESIA POSTOP EVALUATION NOTE        Anesthesia Service      [facility]     07/13/2012     Last Vitals: Temperature: 36.6 C (97.9 F) (07/13/12 1506)  Heart Rate: 71  (07/13/12 1506)  BP (Non-Invasive): 127/70 mmHg (07/13/12 1506)  Respiratory Rate: 12  (07/13/12 1506)  SpO2-1: 98 % (07/13/12 1506)  Pain Score (Numeric, Faces): 0 (07/13/12 1506)    Procedure(s) Performed: Procedure(s) with comments:  PHACO WITH INTRAOCULAR LENS - AVASTIN  PT IS DIABETIC  INJECTION MEDICATION EYE  Patient is sufficiently recovered from the effects of anesthesia to participate in the evaluation and has returned to their pre-procedure level.  I have reviewed and evaluated the following:  Respiratory Function: Consistent with pre anesthetic level  Cardiovascular Function: Consistent with pre anesthetic level  Mental Status: Return to pre anesthetic baseline level    Comment/ re-evaluation for any variations: None    The signing/co-signing faculty has examined the patient and agrees with the above details of the post-operative assessment.

## 2012-07-14 ENCOUNTER — Ambulatory Visit: Payer: Medicare Other | Attending: Ophthalmology | Admitting: Ophthalmology

## 2012-07-14 ENCOUNTER — Encounter (INDEPENDENT_AMBULATORY_CARE_PROVIDER_SITE_OTHER): Payer: Self-pay | Admitting: Ophthalmology

## 2012-07-14 DIAGNOSIS — Z09 Encounter for follow-up examination after completed treatment for conditions other than malignant neoplasm: Secondary | ICD-10-CM | POA: Insufficient documentation

## 2012-07-14 DIAGNOSIS — Z9849 Cataract extraction status, unspecified eye: Secondary | ICD-10-CM | POA: Insufficient documentation

## 2012-07-14 DIAGNOSIS — Z961 Presence of intraocular lens: Secondary | ICD-10-CM | POA: Insufficient documentation

## 2012-07-14 NOTE — Progress Notes (Signed)
 [facility]  [facility]  Operated by [facility]  [address]  [place] [address]  Dept: [phone]    Patient Name: Christy Hale  MRN# [id number]    Date of Service: 07/14/2012    Chief Complaint:   Chief Complaint   Patient presents with   . Post-op #1     1 day post op CE IOL OS. Patient states OS doing well- no pain. Patient using eye drops as directed       Past History  Current Outpatient Prescriptions   Medication Status Sig   . prednisoLONE  acetate (PRED FORTE ) 1 % Ophthalmic Drops, Suspension Active Instill 1 Drop into left eye Four times a day   . moxifloxacin  (VIGAMOX ) 0.5 % Ophthalmic Drops Active Instill 1 Drop into left eye Four times a day   . Cholecalciferol, Vitamin D3, (VITAMIN D-3) 400 unit Oral Capsule Active Take 1 Tab by mouth Once a day   . prednisoLONE  acetate (PRED FORTE ) 1 % Ophthalmic Drops, Suspension Active Instill 1 Drop into both eyes Every 4 hours   . citalopram  (CELEXA ) 40 mg Oral Tablet Active Take 40 mg by mouth Once a day   . promethazine  (PHENERGAN ) 25 mg Oral Tablet Active Take 25 mg by mouth Three times a day as needed   . amoxicillin (AMOXIL) 500 mg Oral Capsule Active Take 500 mg by mouth Twice daily   . ferrous sulfate (FERATAB) 324 mg (65 mg iron ) Oral Tablet, Delayed Release (E.C.) Active Take 324 mg by mouth Every morning before breakfast   . traMADol  (ULTRAM ) 50 mg Oral Tablet Active Three times a day as needed One tab at bedtime   . cloNIDine  (CATAPRES -TTS) 0.1 mg/24 hr Transdermal Patch Weekly Active    . furosemide  (LASIX ) 20 mg Oral Tablet Active 20 mg Once a day    . levothyroxine  (SYNTHROID) 25 mcg Oral Tablet Active Take 25 mcg by mouth Once a day    . metoclopramide  HCl (REGLAN ) 5 mg Oral Tablet Active 5 mg Three times daily before meals    . KLOR-CON  M20 20 mEq Oral Tab Sust.Rel. Particle/Crystal Active 20 mEq Every 48 hours    . omeprazole (PRILOSEC) 20 mg Oral Capsule, Delayed Release(E.C.) Active Take 20 mg by mouth Twice daily     . simvastatin  (ZOCOR ) 40 mg Oral Tablet Active 40 mg Once a day    . sennosides-docusate sodium  (SENOKOT-S) 8.6-50 mg Oral Tablet Active take 1 Tab by mouth QPM.   . INSULIN  LISPRO (HUMALOG  SUBQ) Active by Subcutaneous route Twice daily On a sliding scale  Takes 22 to 28 units on sliding scale   . NIACIN, INOSITOL NIACINATE, ORAL Active take 500 mg by mouth Once a day.   . INSULIN  GLARGINE,HUM.REC.ANLOG (LANTUS  SUBQ) Active 50 Units by Subcutaneous route Every night      No current facility-administered medications for this visit.     Facility-Administered Medications Ordered in Other Visits   Medication Status   . cyclopentolate  (CYCLOGYL ) 1% ophthalmic solution Completed   . moxifloxacin  (VIGAMOX ) 0.5 % ophthalmic solution Completed   . phenylephrine  (AK-DILATE) 2.5% ophthalmic solution Completed   . acetaminophen  (TYLENOL ) tablet Discontinued   . acetaminophen  (TYLENOL ) tablet Discontinued   . balanced salt  (BSS) ophthalmic solution Discontinued   . balanced salt  (BSS) 500 mL with epinephrine  (ADRENALIN ) 1 mg/mL (1:1,000) (1mL) 1 mg intraoccular Discontinued   . lidocaine  PF (XYLOCAINE -MPF) 1% injection Discontinued   . lidocaine  (AKTEN) 3.5 % ophthalmic gel Discontinued   .  tetracaine  (PONTOCAINE) 0.5 % ophthalmic solution Discontinued   . NS flush syringe Discontinued   . NS flush syringe Discontinued   . LR premix infusion Discontinued   . midazolam  (VERSED ) 1 mg/mL injection Discontinued   . LR premix infusion Discontinued   . bevacizumab  (AVASTIN ) 1.25 mg/0.05 mL intravitreal Completed   . cefUROXime  (ZINACEF ) 1 mg/0.1 mL ophthalmic injection Discontinued     No Known Allergies  Past Medical History   Diagnosis Date   . Hypertension    . Ulcer disease    . Lung cancer      left lung, unknown type   . Arthritis    . Diabetes mellitus    . Hyperlipidemia    . History of transfusion      no reaction   . GERD (gastroesophageal reflux disease)      hx of, no longer takes meds   . Kidney disease      s/p  nephrectomy due to cancer   . cancer      s/p renal cancer, lung cancer, skin cancer, and now with bladder cancer   . Shortness of breath      with humidity   . Seizures      x 3 2012 none since then   . Muscle weakness    . Urinary tract infection    . Type 2 diabetes mellitus      dx 20 + yrs ago, fasting up to 311 low during night   . Morbid obesity        Review of Systems       Wilbert JONETTA Lefevre, COMT 07/14/2012, 11:08 AM      Assessment: This patient has a satisfactory Post-op visit #1 following cataract extraction with PCIOL left eye. There is minimal corneal edema. There is no elevation of the intraocular pressure..    Plan: Vigamox  1 drop QID to operative eye. Pred Forte  1 Drop QID to operatve eye. Patient instructed in proper drop usage. Patient advised to wear shield over operative eye at bedtime for one week. Return to clinic in approximately one week. Patient instructed to call [facility] with any change in vision, pain, or redness between now and next visit. Patient is to avoid activities that might result in bumping the eye and patient is not to immerse head under water . Showering is permitted.    I reviewed and made necessary changes to the technician, resident or fellow note regarding CC/HPI/ROS/Past Family, Medical and Social Hx/Surg Hx.  Lonni Gaudy, MD 07/14/2012, 11:26 AM        Lonni Gaudy, MD 07/14/2012, 11:26 AM      I reviewed and made necessary changes to the technician, resident or fellow note regarding CC/HPI/ROS/Past Family, Medical and Social Hx/Surg Hx.  Carlin Ada, MD 07/14/2012, 11:29 AM      I saw and examined the patient.  I reviewed the resident's note.  I agree with the findings and plan of care as documented in the resident's note.  Any exceptions/additions are edited/noted.    Carlin Ada, MD 07/14/2012, 11:29 AM

## 2012-07-14 NOTE — Patient Instructions (Signed)
Vigamox 1 drop 4 times daily to operative eye. Prednisolone 1 Drop 4 times daily to operatve eye.  Wait 2-3 minutes between drops. Wear shield over operative eye at bedtime for one week. Return to clinic in approximately one week. Call [facility] with any change in vision, pain, or redness between now and next visit. Avoid activities that might result in bumping the eye and do not immerse head under water. Showering is permitted.

## 2012-07-21 ENCOUNTER — Ambulatory Visit: Payer: Medicare Other | Attending: Ophthalmology | Admitting: Ophthalmology

## 2012-07-21 ENCOUNTER — Encounter (INDEPENDENT_AMBULATORY_CARE_PROVIDER_SITE_OTHER): Payer: Medicare Other | Admitting: Ophthalmology

## 2012-07-21 DIAGNOSIS — Z961 Presence of intraocular lens: Secondary | ICD-10-CM | POA: Insufficient documentation

## 2012-07-21 NOTE — Progress Notes (Signed)
 [facility]  [facility]  Operated by [facility]  [address]  [place] [address]  Dept: [phone]    Patient Name: Christy Hale  MRN# [id number]    Date of Service: 07/21/2012    Chief Complaint:   Chief Complaint   Patient presents with   . Post-op #2     1 week post op CE IOL OS. Patient states OS doing well- no pain. Patient using eye drops as directed       Past History  Current Outpatient Prescriptions   Medication Status Sig   . prednisoLONE  acetate (PRED FORTE ) 1 % Ophthalmic Drops, Suspension Active Instill 1 Drop into left eye Four times a day   . moxifloxacin  (VIGAMOX ) 0.5 % Ophthalmic Drops Active Instill 1 Drop into left eye Four times a day   . Cholecalciferol, Vitamin D3, (VITAMIN D-3) 400 unit Oral Capsule Active Take 1 Tab by mouth Once a day   . prednisoLONE  acetate (PRED FORTE ) 1 % Ophthalmic Drops, Suspension Active Instill 1 Drop into both eyes Every 4 hours   . citalopram  (CELEXA ) 40 mg Oral Tablet Active Take 40 mg by mouth Once a day   . promethazine  (PHENERGAN ) 25 mg Oral Tablet Active Take 25 mg by mouth Three times a day as needed   . amoxicillin (AMOXIL) 500 mg Oral Capsule Active Take 500 mg by mouth Twice daily   . ferrous sulfate (FERATAB) 324 mg (65 mg iron ) Oral Tablet, Delayed Release (E.C.) Active Take 324 mg by mouth Every morning before breakfast   . traMADol  (ULTRAM ) 50 mg Oral Tablet Active Three times a day as needed One tab at bedtime   . cloNIDine  (CATAPRES -TTS) 0.1 mg/24 hr Transdermal Patch Weekly Active    . furosemide  (LASIX ) 20 mg Oral Tablet Active 20 mg Once a day    . levothyroxine  (SYNTHROID) 25 mcg Oral Tablet Active Take 25 mcg by mouth Once a day    . metoclopramide  HCl (REGLAN ) 5 mg Oral Tablet Active 5 mg Three times daily before meals    . KLOR-CON  M20 20 mEq Oral Tab Sust.Rel. Particle/Crystal Active 20 mEq Every 48 hours    . omeprazole (PRILOSEC) 20 mg Oral Capsule, Delayed Release(E.C.) Active Take 20 mg by mouth Twice  daily    . simvastatin  (ZOCOR ) 40 mg Oral Tablet Active 40 mg Once a day    . sennosides-docusate sodium  (SENOKOT-S) 8.6-50 mg Oral Tablet Active take 1 Tab by mouth QPM.   . INSULIN  LISPRO (HUMALOG  SUBQ) Active by Subcutaneous route Twice daily On a sliding scale  Takes 22 to 28 units on sliding scale   . NIACIN, INOSITOL NIACINATE, ORAL Active take 500 mg by mouth Once a day.   . INSULIN  GLARGINE,HUM.REC.ANLOG (LANTUS  SUBQ) Active 50 Units by Subcutaneous route Every night      No current facility-administered medications for this visit.     No Known Allergies  Past Medical History   Diagnosis Date   . Hypertension    . Ulcer disease    . Lung cancer      left lung, unknown type   . Arthritis    . Diabetes mellitus    . Hyperlipidemia    . History of transfusion      no reaction   . GERD (gastroesophageal reflux disease)      hx of, no longer takes meds   . Kidney disease      s/p nephrectomy due to cancer   .  cancer      s/p renal cancer, lung cancer, skin cancer, and now with bladder cancer   . Shortness of breath      with humidity   . Seizures      x 3 2012 none since then   . Muscle weakness    . Urinary tract infection    . Type 2 diabetes mellitus      dx 20 + yrs ago, fasting up to 311 low during night   . Morbid obesity        Review of Systems       Wilbert JONETTA Lefevre, COMT 07/21/2012, 2:59 PM      Assessment: This patient has satisfactory post-op visit #2 following cataract extraction with lens implant of left eye.    Plan: Discontinue Vigamox  eye drops. Taper Pred Forte  as follows: 1 drop to operative eye TID x 1 week, then BID x week, then Q Day x week, then stop. Discontinue eye shield at bedtime. Return to clinic in 3-4 weeks with retina.    I reviewed and made necessary changes to the technician, resident or fellow note regarding CC/HPI/ROS/Past Family, Medical and Social Hx/Surg Hx.  Carlin Ada, MD 07/21/2012, 3:14 PM      Carlin Ada, MD 07/21/2012, 3:14 PM

## 2012-07-27 DIAGNOSIS — M5137 Other intervertebral disc degeneration, lumbosacral region: Secondary | ICD-10-CM | POA: Diagnosis not present

## 2012-07-27 DIAGNOSIS — IMO0001 Reserved for inherently not codable concepts without codable children: Secondary | ICD-10-CM | POA: Diagnosis not present

## 2012-07-27 DIAGNOSIS — M9981 Other biomechanical lesions of cervical region: Secondary | ICD-10-CM | POA: Diagnosis not present

## 2012-08-13 DIAGNOSIS — M9981 Other biomechanical lesions of cervical region: Secondary | ICD-10-CM | POA: Diagnosis not present

## 2012-08-13 DIAGNOSIS — IMO0001 Reserved for inherently not codable concepts without codable children: Secondary | ICD-10-CM | POA: Diagnosis not present

## 2012-08-13 DIAGNOSIS — M5137 Other intervertebral disc degeneration, lumbosacral region: Secondary | ICD-10-CM | POA: Diagnosis not present

## 2012-08-16 DIAGNOSIS — M9981 Other biomechanical lesions of cervical region: Secondary | ICD-10-CM | POA: Diagnosis not present

## 2012-08-16 DIAGNOSIS — IMO0001 Reserved for inherently not codable concepts without codable children: Secondary | ICD-10-CM | POA: Diagnosis not present

## 2012-08-16 DIAGNOSIS — M5137 Other intervertebral disc degeneration, lumbosacral region: Secondary | ICD-10-CM | POA: Diagnosis not present

## 2012-08-19 DIAGNOSIS — IMO0001 Reserved for inherently not codable concepts without codable children: Secondary | ICD-10-CM | POA: Diagnosis not present

## 2012-08-19 DIAGNOSIS — M9981 Other biomechanical lesions of cervical region: Secondary | ICD-10-CM | POA: Diagnosis not present

## 2012-08-19 DIAGNOSIS — M5137 Other intervertebral disc degeneration, lumbosacral region: Secondary | ICD-10-CM | POA: Diagnosis not present

## 2012-08-24 ENCOUNTER — Encounter (INDEPENDENT_AMBULATORY_CARE_PROVIDER_SITE_OTHER): Payer: Self-pay

## 2012-08-24 ENCOUNTER — Ambulatory Visit (HOSPITAL_BASED_OUTPATIENT_CLINIC_OR_DEPARTMENT_OTHER): Payer: Medicare Other

## 2012-08-24 ENCOUNTER — Ambulatory Visit
Admission: RE | Admit: 2012-08-24 | Discharge: 2012-08-24 | Disposition: A | Discharge status: Home / Self Care | Payer: Medicare Other | Source: Ambulatory Visit

## 2012-08-24 DIAGNOSIS — Z961 Presence of intraocular lens: Secondary | ICD-10-CM | POA: Insufficient documentation

## 2012-08-24 DIAGNOSIS — Z87891 Personal history of nicotine dependence: Secondary | ICD-10-CM | POA: Insufficient documentation

## 2012-08-24 DIAGNOSIS — E11311 Type 2 diabetes mellitus with unspecified diabetic retinopathy with macular edema: Secondary | ICD-10-CM | POA: Insufficient documentation

## 2012-08-24 DIAGNOSIS — E1139 Type 2 diabetes mellitus with other diabetic ophthalmic complication: Secondary | ICD-10-CM | POA: Insufficient documentation

## 2012-08-24 DIAGNOSIS — E113299 Type 2 diabetes mellitus with mild nonproliferative diabetic retinopathy without macular edema, unspecified eye: Secondary | ICD-10-CM | POA: Insufficient documentation

## 2012-08-24 NOTE — Progress Notes (Addendum)
[facility]STITUTE  Operated by [facility]             [address]  [place] [address]  Dept: [phone]      Patient Name: Christy Hale  MRN# [id number]    Date of Service: 08/24/2012    Chief Complaint:   Chief Complaint   Patient presents with   . Diabetes       Technician History of Present Illness:(Location, Duration, Quality, Context, Timing, Severity, Modifying Factors, Associated signs and Symptoms)    Subjective: Christy Hale is a 71 y.o. female here today for Diabetes    She is here for : Referred from Dr Christell Constant pt states she says he looked in both eyes and just told her she needed to see a retina dr. She says her va has improved since having her cataract sx. -floaters + thinks maybe occas flashes but she is not sure. She was dx with diabetes in her 65's and has had laser tx ou Dr. Jobe Marker.    Tech HPI:     Past History  Current Outpatient Prescriptions   Medication Sig   . amoxicillin (AMOXIL) 500 mg Oral Capsule Take 500 mg by mouth Twice daily   . Cholecalciferol, Vitamin D3, (VITAMIN D-3) 400 unit Oral Capsule Take 1 Tab by mouth Once a day   . citalopram (CELEXA) 40 mg Oral Tablet Take 40 mg by mouth Once a day   . cloNIDine (CATAPRES-TTS) 0.1 mg/24 hr Transdermal Patch Weekly    . ferrous sulfate (FERATAB) 324 mg (65 mg iron) Oral Tablet, Delayed Release (E.C.) Take 324 mg by mouth Every morning before breakfast   . furosemide (LASIX) 20 mg Oral Tablet 20 mg Once a day    . INSULIN GLARGINE,HUM.REC.ANLOG (LANTUS SUBQ) 50 Units by Subcutaneous route Every night    . INSULIN LISPRO (HUMALOG SUBQ) by Subcutaneous route Twice daily On a sliding scale  Takes 22 to 28 units on sliding scale   . KLOR-CON M20 20 mEq Oral Tab Sust.Rel. Particle/Crystal 20 mEq Every 48 hours    . levothyroxine (SYNTHROID) 25 mcg Oral Tablet Take 25 mcg by mouth Once a day    . metoclopramide HCl (REGLAN) 5 mg Oral Tablet 5 mg Three times daily before meals     . NIACIN, INOSITOL NIACINATE, ORAL take 500 mg by mouth Once a day.   Marland Kitchen omeprazole (PRILOSEC) 20 mg Oral Capsule, Delayed Release(E.C.) Take 20 mg by mouth Twice daily    . prednisoLONE acetate (PRED FORTE) 1 % Ophthalmic Drops, Suspension Instill 1 Drop into both eyes Every 4 hours   . prednisoLONE acetate (PRED FORTE) 1 % Ophthalmic Drops, Suspension Instill 1 Drop into left eye Four times a day   . promethazine (PHENERGAN) 25 mg Oral Tablet Take 25 mg by mouth Three times a day as needed   . sennosides-docusate sodium (SENOKOT-S) 8.6-50 mg Oral Tablet take 1 Tab by mouth QPM.   . simvastatin (ZOCOR) 40 mg Oral Tablet 40 mg Once a day    . traMADol (ULTRAM) 50 mg Oral Tablet Three times a day as needed One tab at bedtime   . [DISCONTINUED] moxifloxacin (VIGAMOX) 0.5 % Ophthalmic Drops Instill 1 Drop into left eye Four times a day     No current facility-administered medications for this visit.     No Known Allergies  Past Medical History   Diagnosis Date   . Hypertension    . Ulcer disease    .  Lung cancer      left lung, unknown type   . Arthritis    . Diabetes mellitus    . Hyperlipidemia    . History of transfusion      no reaction   . GERD (gastroesophageal reflux disease)      hx of, no longer takes meds   . Kidney disease      s/p nephrectomy due to cancer   . cancer      s/p renal cancer, lung cancer, skin cancer, and now with bladder cancer   . Shortness of breath      with humidity   . Seizures      x 3 2012 none since then   . Muscle weakness    . Urinary tract infection    . Type 2 diabetes mellitus      dx 20 + yrs ago, fasting up to 311 low during night   . Morbid obesity    . H/O carcinoma of bladder      Past Surgical History   Procedure Laterality Date   . Hx hysterectomy     . Hx hand surgery       bilateral   . Hx foot surgery       right   . Hx other       left arm surgery   . Hx nephrectomy       with ureterectomy (left) left nephrectomy   . Hx other       left lung cancer removal    . Hx laser eye surgery       laser OU for DM   . Hx cataract removal  06/10/2012     CE IOL OD w/avastin   . Hx cataract removal Left 07/13/12     CE IOL OS   . Hx cystectomy  12/31/2010     radical cystectomy      Family History  Family History   Problem Relation Age of Onset   . Diabetes Mother    . Diabetes Father    . Hypertension Mother    . Hypertension Father    . Coronary Artery Disease Mother    . Lung Cancer Sister      smoker     Social History  History   Substance Use Topics   . Smoking status: Former Smoker -- 3.00 packs/day for 15 years     Types: Cigarettes     Quit date: 10/20/1981   . Smokeless tobacco: Never Used   . Alcohol Use: Yes      Comment: wine rarely     Review of Systems  ENDOCRINE: (diabetes, thyroid, etc.): Positive  Comments: DM was dx in her 40's//BS yesterday 257 she states it normally runs high    [employer], COT 08/24/2012, 9:55 AM        MD additions to HPI Vision is still bad after CEIOL and it didn't help too much.  Had a bunch of laser by Dr Jobe Marker and stopped going a while ago.  Moderate blur, constant, no glasses currently.  (see technicians note for HPI).    I reviewed, confirmed and made necessary changes to the technician or resident note regarding CC/HPI/ROS/Past Family, Medical and Social Hx/Surg Hx.    Please refer to my [facility] EXAM for specific details of the the examination.     Assessment  1. Type 2 diabetes mellitus    2. Diabetic macular edema    3. Mild nonproliferative  diabetic retinopathy        Orders Placed This Encounter   Procedures   . OPH OCT BI       No orders of the defined types were placed in this encounter.       Plan:   Unfortunately she has atropic changes to her retina and a few minimal cysts.  I don't think they warrant injection of laser at this time.  I recommend that she get her new Rx after CEIOL and I'll see her back in a few months.   Follow up in 4 months.  Special Testing : DFE OU    Sheppard Evens, MD 08/24/2012, 10:44 AM   .

## 2012-08-24 NOTE — Progress Notes (Signed)
[facility]STITUTE  Operated by [facility]             [address]  [place] [address]  Dept: [phone]      Patient Name: Christy Hale  MRN# [id number]    Date of Service: 08/24/2012    Chief Complaint:   Chief Complaint   Patient presents with   . Diabetes       Technician History of Present Illness:(Location, Duration, Quality, Context, Timing, Severity, Modifying Factors, Associated signs and Symptoms)    Subjective: Christy Hale is a 71 y.o. female here today for Diabetes    She is here for : Referred from Dr Christell Constant pt states she says he looked in both eyes and just told her she needed to see a retina dr. She says her va has improved since having her cataract sx. -floaters + thinks maybe occas flashes but she is not sure. She was dx with diabetes in her 65's and has had laser tx ou Dr. Jobe Marker.    Tech HPI:     Past History  Current Outpatient Prescriptions   Medication Sig   . amoxicillin (AMOXIL) 500 mg Oral Capsule Take 500 mg by mouth Twice daily   . Cholecalciferol, Vitamin D3, (VITAMIN D-3) 400 unit Oral Capsule Take 1 Tab by mouth Once a day   . citalopram (CELEXA) 40 mg Oral Tablet Take 40 mg by mouth Once a day   . cloNIDine (CATAPRES-TTS) 0.1 mg/24 hr Transdermal Patch Weekly    . ferrous sulfate (FERATAB) 324 mg (65 mg iron) Oral Tablet, Delayed Release (E.C.) Take 324 mg by mouth Every morning before breakfast   . furosemide (LASIX) 20 mg Oral Tablet 20 mg Once a day    . INSULIN GLARGINE,HUM.REC.ANLOG (LANTUS SUBQ) 50 Units by Subcutaneous route Every night    . INSULIN LISPRO (HUMALOG SUBQ) by Subcutaneous route Twice daily On a sliding scale  Takes 22 to 28 units on sliding scale   . KLOR-CON M20 20 mEq Oral Tab Sust.Rel. Particle/Crystal 20 mEq Every 48 hours    . levothyroxine (SYNTHROID) 25 mcg Oral Tablet Take 25 mcg by mouth Once a day    . metoclopramide HCl (REGLAN) 5 mg Oral Tablet 5 mg Three times daily before meals     . NIACIN, INOSITOL NIACINATE, ORAL take 500 mg by mouth Once a day.   Marland Kitchen omeprazole (PRILOSEC) 20 mg Oral Capsule, Delayed Release(E.C.) Take 20 mg by mouth Twice daily    . prednisoLONE acetate (PRED FORTE) 1 % Ophthalmic Drops, Suspension Instill 1 Drop into both eyes Every 4 hours   . prednisoLONE acetate (PRED FORTE) 1 % Ophthalmic Drops, Suspension Instill 1 Drop into left eye Four times a day   . promethazine (PHENERGAN) 25 mg Oral Tablet Take 25 mg by mouth Three times a day as needed   . sennosides-docusate sodium (SENOKOT-S) 8.6-50 mg Oral Tablet take 1 Tab by mouth QPM.   . simvastatin (ZOCOR) 40 mg Oral Tablet 40 mg Once a day    . traMADol (ULTRAM) 50 mg Oral Tablet Three times a day as needed One tab at bedtime   . [DISCONTINUED] moxifloxacin (VIGAMOX) 0.5 % Ophthalmic Drops Instill 1 Drop into left eye Four times a day     No current facility-administered medications for this visit.     No Known Allergies  Past Medical History   Diagnosis Date   . Hypertension    . Ulcer disease    .  Lung cancer      left lung, unknown type   . Arthritis    . Diabetes mellitus    . Hyperlipidemia    . History of transfusion      no reaction   . GERD (gastroesophageal reflux disease)      hx of, no longer takes meds   . Kidney disease      s/p nephrectomy due to cancer   . cancer      s/p renal cancer, lung cancer, skin cancer, and now with bladder cancer   . Shortness of breath      with humidity   . Seizures      x 3 2012 none since then   . Muscle weakness    . Urinary tract infection    . Type 2 diabetes mellitus      dx 20 + yrs ago, fasting up to 311 low during night   . Morbid obesity    . H/O carcinoma of bladder      Past Surgical History   Procedure Laterality Date   . Hx hysterectomy     . Hx hand surgery       bilateral   . Hx foot surgery       right   . Hx other       left arm surgery   . Hx nephrectomy       with ureterectomy (left) left nephrectomy   . Hx other       left lung cancer removal    . Hx laser eye surgery       laser OU for DM   . Hx cataract removal  06/10/2012     CE IOL OD w/avastin   . Hx cataract removal Left 07/13/12     CE IOL OS   . Hx cystectomy  12/31/2010     radical cystectomy      Family History  Family History   Problem Relation Age of Onset   . Diabetes Mother    . Diabetes Father    . Hypertension Mother    . Hypertension Father    . Coronary Artery Disease Mother    . Lung Cancer Sister      smoker     Social History  History   Substance Use Topics   . Smoking status: Former Smoker -- 3.00 packs/day for 15 years     Types: Cigarettes     Quit date: 10/20/1981   . Smokeless tobacco: Never Used   . Alcohol Use: Yes      Comment: wine rarely     Review of Systems  ENDOCRINE: (diabetes, thyroid, etc.): Positive  Comments: DM was dx in her 40's//BS yesterday 257 she states it normally runs high    [employer], COT 08/24/2012, 9:55 AM

## 2012-08-26 DIAGNOSIS — M5137 Other intervertebral disc degeneration, lumbosacral region: Secondary | ICD-10-CM | POA: Diagnosis not present

## 2012-08-26 DIAGNOSIS — M9981 Other biomechanical lesions of cervical region: Secondary | ICD-10-CM | POA: Diagnosis not present

## 2012-08-26 DIAGNOSIS — IMO0001 Reserved for inherently not codable concepts without codable children: Secondary | ICD-10-CM | POA: Diagnosis not present

## 2012-08-30 ENCOUNTER — Encounter (INDEPENDENT_AMBULATORY_CARE_PROVIDER_SITE_OTHER): Payer: Self-pay | Admitting: Ophthalmology

## 2012-08-30 ENCOUNTER — Ambulatory Visit: Payer: Medicare Other | Attending: Ophthalmology | Admitting: Ophthalmology

## 2012-08-30 DIAGNOSIS — Z961 Presence of intraocular lens: Secondary | ICD-10-CM | POA: Insufficient documentation

## 2012-08-30 DIAGNOSIS — Z09 Encounter for follow-up examination after completed treatment for conditions other than malignant neoplasm: Secondary | ICD-10-CM | POA: Insufficient documentation

## 2012-08-30 DIAGNOSIS — Z87891 Personal history of nicotine dependence: Secondary | ICD-10-CM | POA: Insufficient documentation

## 2012-08-30 DIAGNOSIS — Z9849 Cataract extraction status, unspecified eye: Secondary | ICD-10-CM | POA: Insufficient documentation

## 2012-08-30 NOTE — Progress Notes (Signed)
[facility]  [facility]  Operated by [facility]  [address]  [place] [address]  Dept: [phone]    Patient Name: Christy Hale  MRN# [id number]    Date of Service: 08/30/2012    Chief Complaint:   Chief Complaint   Patient presents with   . Post-op #3     1 mo p/o CEIOL OS. Pt also sees Dr Hyacinth Meeker for DM- told her to get new RX. PT states she feels vision is about as good as its going to get. Pt states no eye pain, No floaters . Occ flashes- rare. BS 230 about  1 wk ago.        Past History  Current Outpatient Prescriptions   Medication Sig   . amoxicillin (AMOXIL) 500 mg Oral Capsule Take 500 mg by mouth Twice daily   . Cholecalciferol, Vitamin D3, (VITAMIN D-3) 400 unit Oral Capsule Take 1 Tab by mouth Once a day   . citalopram (CELEXA) 40 mg Oral Tablet Take 40 mg by mouth Once a day   . cloNIDine (CATAPRES-TTS) 0.1 mg/24 hr Transdermal Patch Weekly    . ferrous sulfate (FERATAB) 324 mg (65 mg iron) Oral Tablet, Delayed Release (E.C.) Take 324 mg by mouth Every morning before breakfast   . furosemide (LASIX) 20 mg Oral Tablet 20 mg Once a day    . INSULIN GLARGINE,HUM.REC.ANLOG (LANTUS SUBQ) 50 Units by Subcutaneous route Every night    . INSULIN LISPRO (HUMALOG SUBQ) by Subcutaneous route Twice daily On a sliding scale  Takes 22 to 28 units on sliding scale   . KLOR-CON M20 20 mEq Oral Tab Sust.Rel. Particle/Crystal 20 mEq Every 48 hours    . levothyroxine (SYNTHROID) 25 mcg Oral Tablet Take 25 mcg by mouth Once a day    . metoclopramide HCl (REGLAN) 5 mg Oral Tablet 5 mg Three times daily before meals    . NIACIN, INOSITOL NIACINATE, ORAL take 500 mg by mouth Once a day.   Marland Kitchen omeprazole (PRILOSEC) 20 mg Oral Capsule, Delayed Release(E.C.) Take 20 mg by mouth Twice daily    . promethazine (PHENERGAN) 25 mg Oral Tablet Take 25 mg by mouth Three times a day as needed   . sennosides-docusate sodium (SENOKOT-S) 8.6-50 mg Oral Tablet take 1 Tab by mouth QPM.    . simvastatin (ZOCOR) 40 mg Oral Tablet 40 mg Once a day    . traMADol (ULTRAM) 50 mg Oral Tablet Three times a day as needed One tab at bedtime   . [DISCONTINUED] prednisoLONE acetate (PRED FORTE) 1 % Ophthalmic Drops, Suspension Instill 1 Drop into both eyes Every 4 hours   . [DISCONTINUED] prednisoLONE acetate (PRED FORTE) 1 % Ophthalmic Drops, Suspension Instill 1 Drop into left eye Four times a day     No current facility-administered medications for this visit.     No Known Allergies  Past Medical History   Diagnosis Date   . Hypertension    . Ulcer disease    . Lung cancer      left lung, unknown type   . Arthritis    . Diabetes mellitus    . Hyperlipidemia    . History of transfusion      no reaction   . GERD (gastroesophageal reflux disease)      hx of, no longer takes meds   . Kidney disease      s/p nephrectomy due to cancer   . cancer      s/p  renal cancer, lung cancer, skin cancer, and now with bladder cancer   . Shortness of breath      with humidity   . Seizures      x 3 2012 none since then   . Muscle weakness    . Urinary tract infection    . Type 2 diabetes mellitus      dx 20 + yrs ago, fasting up to 311 low during night   . Morbid obesity    . H/O carcinoma of bladder      Past Surgical History   Procedure Laterality Date   . Hx hysterectomy     . Hx hand surgery       bilateral   . Hx foot surgery       right   . Hx other       left arm surgery   . Hx nephrectomy       with ureterectomy (left) left nephrectomy   . Hx other       left lung cancer removal   . Hx laser eye surgery       laser OU for DM   . Hx cataract removal  06/10/2012     CE IOL OD w/avastin   . Hx cataract removal Left 07/13/12     CE IOL OS   . Hx cystectomy  12/31/2010     radical cystectomy      Family History  Family History   Problem Relation Age of Onset   . Diabetes Mother    . Diabetes Father    . Hypertension Mother    . Hypertension Father    . Coronary Artery Disease Mother    . Lung Cancer Sister      smoker      Social History  History   Substance Use Topics   . Smoking status: Former Smoker -- 3.00 packs/day for 15 years     Types: Cigarettes     Quit date: 10/20/1981   . Smokeless tobacco: Never Used   . Alcohol Use: Yes      Comment: wine rarely     Review of Systems  EYES:  (poor vision, eye pain, tearing, redness, etc): Positive  Comments: post op  CONSTITUTIONAL:  (fever, weight loss/gain, fatigue, etc.): Negative  ENT:  ( hearing loss, earaches, stuffy nose, sinuses, teeth, gums, throat, etc.): Negative  CARDIOVASCULAR :  ( high blood pressure, abdnormal heart rate, heart attack, chest pain, high cholesterol, heart murmur or defect, etc.): Negative  RESPIRATORY:  (asthma, bronchitis, emphysema, shortness of breath, wheezing, cough, etc.): Negative  GASTROINTESTINAL:  ( stomach ulcers, heartburn, constipation, liver disease, diarrhea, jaundice, etc): Negative  UROGENITAL:   ( problems w/ urination, kidneys, prostate, kidney stones, dialysis, bladder,  etc.): Negative  HEM/LYMPH:  ( bleeding, anemia, bruising, cancer, sickle cell, swollen nodes, etc.): Negative  MUSCULO: ( muscle aches, joint pain, arthritis, fractures, etc.): Negative  INTEGUMENTARY: ( rashes, psoriasis, eczema, lumps,  etc.): Negative  NEUROLOGICAL: ( stroke, seizures, numbness, weakness, paralysis, delays, learning, speech, etc.): Negative  PSYCHIATRIC: ( anxiety, depression, ADD, etc.): Negative  ENDOCRINE: (diabetes, thyroid, etc.): Positive  Comments: DM  ALL/IMM: (foods, pollens, infectious disease, etc.): Negative  All others negative: yes    Nolon Bussing, COA 08/30/2012, 2:56 PM      Assessment: Satisfactory result of phacoemulsification of cataract with lens implant bilateral eye.    She understand diabetic retinopathy limits [facility], feels cataract surgery was beneficial.    Plan: No  manifest refraction dispensed since it doesn't really help. Patient to return to [facility] in 6 months or prn with Dr. Hyacinth Meeker.       I reviewed and made necessary changes to the technician, resident or fellow note regarding CC/HPI/ROS/Past Family, Medical and Social Hx/Surg Hx.  Kathleen Argue, MD 08/30/2012, 3:13 PM          Kathleen Argue, MD 08/30/2012, 3:13 PM

## 2012-09-09 DIAGNOSIS — Z23 Encounter for immunization: Secondary | ICD-10-CM | POA: Diagnosis not present

## 2012-09-14 DIAGNOSIS — S73129A Ischiocapsular ligament sprain of unspecified hip, initial encounter: Secondary | ICD-10-CM | POA: Diagnosis not present

## 2012-09-20 DIAGNOSIS — S73129A Ischiocapsular ligament sprain of unspecified hip, initial encounter: Secondary | ICD-10-CM | POA: Diagnosis not present

## 2012-10-19 ENCOUNTER — Inpatient Hospital Stay (HOSPITAL_BASED_OUTPATIENT_CLINIC_OR_DEPARTMENT_OTHER)
Admission: EM | Admit: 2012-10-19 | Discharge: 2012-10-19 | Disposition: A | Discharge status: Home / Self Care | Payer: Medicare Other

## 2012-10-25 DIAGNOSIS — Z961 Presence of intraocular lens: Secondary | ICD-10-CM | POA: Diagnosis not present

## 2012-10-25 DIAGNOSIS — H524 Presbyopia: Secondary | ICD-10-CM | POA: Diagnosis not present

## 2012-10-25 DIAGNOSIS — H43399 Other vitreous opacities, unspecified eye: Secondary | ICD-10-CM | POA: Diagnosis not present

## 2012-10-26 DIAGNOSIS — H52229 Regular astigmatism, unspecified eye: Secondary | ICD-10-CM | POA: Diagnosis not present

## 2012-11-22 ENCOUNTER — Encounter: Payer: Self-pay | Admitting: Internal Medicine

## 2012-11-29 ENCOUNTER — Ambulatory Visit (AMBULATORY_SURGERY_CENTER): Payer: Medicare Other | Admitting: *Deleted

## 2012-11-29 VITALS — Ht 66.0 in | Wt 189.2 lb

## 2012-11-29 DIAGNOSIS — Z860100 Personal history of colon polyps, unspecified: Secondary | ICD-10-CM

## 2012-11-29 DIAGNOSIS — Z1211 Encounter for screening for malignant neoplasm of colon: Secondary | ICD-10-CM

## 2012-11-29 DIAGNOSIS — Z800 Family history of malignant neoplasm of digestive organs: Secondary | ICD-10-CM

## 2012-11-29 MED ORDER — MOVIPREP 100 G PO SOLR: ORAL | Status: DC

## 2012-12-09 ENCOUNTER — Encounter: Payer: Self-pay | Admitting: Internal Medicine

## 2012-12-09 ENCOUNTER — Ambulatory Visit (AMBULATORY_SURGERY_CENTER): Payer: Medicare Other | Admitting: Internal Medicine

## 2012-12-09 VITALS — BP 127/68 | HR 80 | Temp 97.3°F | Resp 16 | Ht 66.0 in | Wt 189.0 lb

## 2012-12-09 DIAGNOSIS — Z1211 Encounter for screening for malignant neoplasm of colon: Secondary | ICD-10-CM | POA: Diagnosis not present

## 2012-12-09 DIAGNOSIS — Z86010 Personal history of colonic polyps: Secondary | ICD-10-CM | POA: Diagnosis not present

## 2012-12-09 DIAGNOSIS — D126 Benign neoplasm of colon, unspecified: Secondary | ICD-10-CM

## 2012-12-09 DIAGNOSIS — Z800 Family history of malignant neoplasm of digestive organs: Secondary | ICD-10-CM | POA: Diagnosis not present

## 2012-12-09 MED ORDER — SODIUM CHLORIDE 0.9 % IV SOLN: 500.0000 mL | INTRAVENOUS | Status: DC

## 2012-12-09 NOTE — Patient Instructions (Addendum)

## 2012-12-09 NOTE — Op Note (Signed)
[facility] [address] [place], [address]   COLONOSCOPY PROCEDURE REPORT  PATIENT: Melinda Lucas, Melinda Lucas  MR#: [id number] BIRTHDATE: [birth year] , 71  yrs. old GENDER: Female ENDOSCOPIST: Roxy Cedar, MD REFERRED XB:[id number] Program Recall PROCEDURE DATE:  12/09/2012 PROCEDURE:   Colonoscopy with snare polypectomy    x 3 ASA CLASS:   Class II INDICATIONS:Patient's personal history of adenomatous colon polyps (multiple colonoscopies [place]. Last exam here July 2010 with advanced adenomatas)and Patient's immediate family history of colon cancer (mother 34). MEDICATIONS: MAC sedation, administered by CRNA and propofol (Diprivan) 350mg  IV  DESCRIPTION OF PROCEDURE:   After the risks benefits and alternatives of the procedure were thoroughly explained, informed consent was obtained.  A digital rectal exam revealed no abnormalities of the rectum.   The LB [id number]-[id number]  endoscope was introduced through the anus and advanced to the cecum, which was identified by both the appendix and ileocecal valve. No adverse events experienced.   The quality of the prep was good, using MoviPrep  The instrument was then slowly withdrawn as the colon was fully examined.      COLON FINDINGS: Three polyps were found in the ascending colon(5 mm), transverse colon(sessile 10 mm), and rectum(4 mm).  A polypectomy was performed with a cold snare.  The resection was complete and the polyp tissue was completely retrieved.   Moderate diverticulosis was noted The finding was in the left colon.   The colon mucosa was otherwise normal.  Retroflexed views revealed no abnormalities. The time to cecum=4 minutes 15 seconds.  Withdrawal time=15 minutes 27 seconds.  The scope was withdrawn and the procedure completed. COMPLICATIONS: There were no complications.  ENDOSCOPIC IMPRESSION: 1.   Three polyps were found in the colon and removed with a cold snare 2.   Moderate  diverticulosis was noted in the left colon 3.   The colon mucosa was otherwise normal  RECOMMENDATIONS: 1. Repeat Colonoscopy in 3 years.   eSigned:  Roxy Cedar, MD 12/09/2012 10:57 AM  cc: Kari Baars, MD and The Patient   PATIENT NAME:  Melinda Lucas MR#: [id number]

## 2012-12-09 NOTE — Progress Notes (Signed)
Patient c/o of IV site to right inner forearm sore. Small amount of discoloration at site. IV was not successful in admitting so discontinued. Instructed to place warm compresses at site and to call in needed.

## 2012-12-09 NOTE — Progress Notes (Signed)
Patient did not experience any of the following events: a burn prior to discharge; a fall within the facility; wrong site/side/patient/procedure/implant event; or a hospital transfer or hospital admission upon discharge from the facility. (G8907) Patient did not have preoperative order for IV antibiotic SSI prophylaxis. (G8918)  

## 2012-12-10 ENCOUNTER — Telehealth: Payer: Self-pay

## 2012-12-10 ENCOUNTER — Encounter (INDEPENDENT_AMBULATORY_CARE_PROVIDER_SITE_OTHER): Payer: Medicare Other | Admitting: Urology

## 2012-12-10 ENCOUNTER — Ambulatory Visit
Admission: RE | Admit: 2012-12-10 | Discharge: 2012-12-10 | Disposition: A | Discharge status: Home / Self Care | Payer: Medicare Other | Source: Ambulatory Visit | Attending: Urology | Admitting: Urology

## 2012-12-10 NOTE — Telephone Encounter (Signed)
  Follow up Call-  Call back number 12/09/2012  Post procedure Call Back phone  # [phone]  Permission to leave phone message Yes     Patient questions:  Do you have a fever, pain , or abdominal swelling? no Pain Score  0 *  Have you tolerated food without any problems? yes  Have you been able to return to your normal activities? yes  Do you have any questions about your discharge instructions: Diet   no Medications  no Follow up visit  no  Do you have questions or concerns about your Care? no  Actions: * If pain score is 4 or above: No action needed, pain <4.

## 2012-12-14 ENCOUNTER — Encounter: Payer: Self-pay | Admitting: Internal Medicine

## 2012-12-15 DIAGNOSIS — Z85828 Personal history of other malignant neoplasm of skin: Secondary | ICD-10-CM | POA: Diagnosis not present

## 2012-12-15 DIAGNOSIS — D692 Other nonthrombocytopenic purpura: Secondary | ICD-10-CM | POA: Diagnosis not present

## 2012-12-15 DIAGNOSIS — Z85820 Personal history of malignant melanoma of skin: Secondary | ICD-10-CM | POA: Diagnosis not present

## 2012-12-15 DIAGNOSIS — L821 Other seborrheic keratosis: Secondary | ICD-10-CM | POA: Diagnosis not present

## 2013-01-11 DIAGNOSIS — E559 Vitamin D deficiency, unspecified: Secondary | ICD-10-CM | POA: Diagnosis not present

## 2013-01-11 DIAGNOSIS — E785 Hyperlipidemia, unspecified: Secondary | ICD-10-CM | POA: Diagnosis not present

## 2013-01-11 DIAGNOSIS — Z0000 Encounter for general adult medical examination without abnormal findings: Secondary | ICD-10-CM | POA: Diagnosis not present

## 2013-01-11 DIAGNOSIS — M899 Disorder of bone, unspecified: Secondary | ICD-10-CM | POA: Diagnosis not present

## 2013-01-18 DIAGNOSIS — Z0000 Encounter for general adult medical examination without abnormal findings: Secondary | ICD-10-CM | POA: Diagnosis not present

## 2013-01-18 DIAGNOSIS — E781 Pure hyperglyceridemia: Secondary | ICD-10-CM | POA: Diagnosis not present

## 2013-01-18 DIAGNOSIS — D126 Benign neoplasm of colon, unspecified: Secondary | ICD-10-CM | POA: Diagnosis not present

## 2013-01-18 DIAGNOSIS — R7301 Impaired fasting glucose: Secondary | ICD-10-CM | POA: Diagnosis not present

## 2013-01-18 DIAGNOSIS — E669 Obesity, unspecified: Secondary | ICD-10-CM | POA: Diagnosis not present

## 2013-01-18 DIAGNOSIS — M949 Disorder of cartilage, unspecified: Secondary | ICD-10-CM | POA: Diagnosis not present

## 2013-01-18 DIAGNOSIS — H8110 Benign paroxysmal vertigo, unspecified ear: Secondary | ICD-10-CM | POA: Diagnosis not present

## 2013-01-18 DIAGNOSIS — Z23 Encounter for immunization: Secondary | ICD-10-CM | POA: Diagnosis not present

## 2013-01-18 DIAGNOSIS — E785 Hyperlipidemia, unspecified: Secondary | ICD-10-CM | POA: Diagnosis not present

## 2013-01-18 DIAGNOSIS — E559 Vitamin D deficiency, unspecified: Secondary | ICD-10-CM | POA: Diagnosis not present

## 2013-01-18 DIAGNOSIS — M899 Disorder of bone, unspecified: Secondary | ICD-10-CM | POA: Diagnosis not present

## 2013-01-18 DIAGNOSIS — Z1331 Encounter for screening for depression: Secondary | ICD-10-CM | POA: Diagnosis not present

## 2013-01-25 DIAGNOSIS — Z1212 Encounter for screening for malignant neoplasm of rectum: Secondary | ICD-10-CM | POA: Diagnosis not present

## 2013-02-08 ENCOUNTER — Other Ambulatory Visit: Payer: Self-pay

## 2013-02-08 DIAGNOSIS — Z85828 Personal history of other malignant neoplasm of skin: Secondary | ICD-10-CM | POA: Diagnosis not present

## 2013-02-08 DIAGNOSIS — D485 Neoplasm of uncertain behavior of skin: Secondary | ICD-10-CM | POA: Diagnosis not present

## 2013-02-08 DIAGNOSIS — Z85820 Personal history of malignant melanoma of skin: Secondary | ICD-10-CM | POA: Diagnosis not present

## 2013-02-08 DIAGNOSIS — L570 Actinic keratosis: Secondary | ICD-10-CM | POA: Diagnosis not present

## 2013-02-08 DIAGNOSIS — L259 Unspecified contact dermatitis, unspecified cause: Secondary | ICD-10-CM | POA: Diagnosis not present

## 2013-02-22 ENCOUNTER — Encounter (INDEPENDENT_AMBULATORY_CARE_PROVIDER_SITE_OTHER): Payer: Medicare Other

## 2013-03-02 ENCOUNTER — Inpatient Hospital Stay (HOSPITAL_BASED_OUTPATIENT_CLINIC_OR_DEPARTMENT_OTHER)
Admission: EM | Admit: 2013-03-02 | Discharge: 2013-03-02 | Disposition: A | Discharge status: Home / Self Care | Payer: Medicare Other

## 2013-04-11 DIAGNOSIS — M949 Disorder of cartilage, unspecified: Secondary | ICD-10-CM | POA: Diagnosis not present

## 2013-04-11 DIAGNOSIS — Z13820 Encounter for screening for osteoporosis: Secondary | ICD-10-CM | POA: Diagnosis not present

## 2013-04-11 DIAGNOSIS — Z1231 Encounter for screening mammogram for malignant neoplasm of breast: Secondary | ICD-10-CM | POA: Diagnosis not present

## 2013-04-11 DIAGNOSIS — Z124 Encounter for screening for malignant neoplasm of cervix: Secondary | ICD-10-CM | POA: Diagnosis not present

## 2013-06-21 ENCOUNTER — Other Ambulatory Visit: Payer: Self-pay

## 2013-06-21 DIAGNOSIS — L820 Inflamed seborrheic keratosis: Secondary | ICD-10-CM | POA: Diagnosis not present

## 2013-06-21 DIAGNOSIS — D485 Neoplasm of uncertain behavior of skin: Secondary | ICD-10-CM | POA: Diagnosis not present

## 2013-06-21 DIAGNOSIS — Z85820 Personal history of malignant melanoma of skin: Secondary | ICD-10-CM | POA: Diagnosis not present

## 2013-06-21 DIAGNOSIS — Z85828 Personal history of other malignant neoplasm of skin: Secondary | ICD-10-CM | POA: Diagnosis not present

## 2013-06-21 DIAGNOSIS — L821 Other seborrheic keratosis: Secondary | ICD-10-CM | POA: Diagnosis not present

## 2013-07-14 DIAGNOSIS — Z23 Encounter for immunization: Secondary | ICD-10-CM | POA: Diagnosis not present

## 2013-11-10 ENCOUNTER — Inpatient Hospital Stay (HOSPITAL_BASED_OUTPATIENT_CLINIC_OR_DEPARTMENT_OTHER)
Admission: EM | Admit: 2013-11-10 | Discharge: 2013-11-10 | Disposition: A | Discharge status: Home / Self Care | Payer: Commercial Managed Care - PPO

## 2013-11-10 DIAGNOSIS — R030 Elevated blood-pressure reading, without diagnosis of hypertension: Secondary | ICD-10-CM

## 2013-11-10 DIAGNOSIS — N289 Disorder of kidney and ureter, unspecified: Secondary | ICD-10-CM

## 2013-11-10 DIAGNOSIS — R112 Nausea with vomiting, unspecified: Secondary | ICD-10-CM

## 2013-11-10 DIAGNOSIS — R7309 Other abnormal glucose: Secondary | ICD-10-CM

## 2013-11-10 DIAGNOSIS — K5641 Fecal impaction: Secondary | ICD-10-CM

## 2013-11-18 NOTE — Care Management Notes (Signed)
[facility] INTAKE    Insurance Information:  Referring Provider and Contact Number: Morene Antu [phone]  Transfer Source and Contact Number: Gillis Santa [phone]  Transfer Emergent:      Date Admitted:11/18/13  Diagnosis: GI bleed  Type of bed pt currently in at sending facility:ED  EHO:[id number], left nephrectomy, DM, HTN, GERD, CHF    Dialysis:     Cancer:     Isolation:     Is patient established with family practice/attending?  Reason for transfer: GI bleed    Patient story/clinical presentation: presents with abdominal pain and GI bleed    Current vital signs:  HR:  77    BP: 161/63     Resp:  20  Temp: 36.8      Sats: 97%     O2: RA  Labs:     WBC (3.6-11)    HGB (13.1-17.3) 10.9   Hct (39.8-50.2) 33.9   PLC (140-400)    ABG ph    ABG PCO2    ABG PO2    ABG HCO3    Base def/exc    LP Glucose    LP Neutrophil    LP Protein    LP WBC    LP Other    PT    PTT    INR    CPK    Trop I    AST    ALT    Na (135-    K+ (3.5)    CL (96-1)    CO2 (23-35)    BUN (8-26) 36   Cr (0.62-1.27) 1.8   Glucose 307     Radiology (pelase place images on image grid)CT abdomen/pelvis completed    EKG: none  IV Access: PIV  Medications, IVF, Drips: to receive a dose of Rocephin prior to transfer    Oxygen/Bipap/Vent settings:RA    Pt class and accommodation:inpatient stepdown  Service and accepting MD: medicine 2, Dr. Juluis Rainier

## 2013-11-19 ENCOUNTER — Inpatient Hospital Stay (HOSPITAL_COMMUNITY): Payer: Commercial Managed Care - PPO | Admitting: Internal Medicine

## 2013-11-19 ENCOUNTER — Inpatient Hospital Stay (HOSPITAL_COMMUNITY): Payer: Commercial Managed Care - PPO

## 2013-11-19 ENCOUNTER — Inpatient Hospital Stay
Admission: AD | Admit: 2013-11-19 | Discharge: 2013-11-24 | DRG: 357 | Disposition: A | Discharge status: Home / Self Care | Payer: Commercial Managed Care - PPO | Source: Other Acute Inpatient Hospital | Attending: Specialist | Admitting: Specialist

## 2013-11-19 DIAGNOSIS — R918 Other nonspecific abnormal finding of lung field: Secondary | ICD-10-CM

## 2013-11-19 DIAGNOSIS — R911 Solitary pulmonary nodule: Secondary | ICD-10-CM | POA: Diagnosis present

## 2013-11-19 DIAGNOSIS — Z8551 Personal history of malignant neoplasm of bladder: Secondary | ICD-10-CM

## 2013-11-19 DIAGNOSIS — B9689 Other specified bacterial agents as the cause of diseases classified elsewhere: Secondary | ICD-10-CM | POA: Diagnosis present

## 2013-11-19 DIAGNOSIS — T83511A Infection and inflammatory reaction due to indwelling urethral catheter, initial encounter: Secondary | ICD-10-CM | POA: Diagnosis present

## 2013-11-19 DIAGNOSIS — R1903 Right lower quadrant abdominal swelling, mass and lump: Secondary | ICD-10-CM | POA: Diagnosis present

## 2013-11-19 DIAGNOSIS — Z794 Long term (current) use of insulin: Secondary | ICD-10-CM

## 2013-11-19 DIAGNOSIS — K219 Gastro-esophageal reflux disease without esophagitis: Secondary | ICD-10-CM | POA: Diagnosis present

## 2013-11-19 DIAGNOSIS — R1900 Intra-abdominal and pelvic swelling, mass and lump, unspecified site: Secondary | ICD-10-CM | POA: Diagnosis present

## 2013-11-19 DIAGNOSIS — E119 Type 2 diabetes mellitus without complications: Secondary | ICD-10-CM | POA: Diagnosis present

## 2013-11-19 DIAGNOSIS — D649 Anemia, unspecified: Secondary | ICD-10-CM | POA: Diagnosis present

## 2013-11-19 DIAGNOSIS — E785 Hyperlipidemia, unspecified: Secondary | ICD-10-CM | POA: Diagnosis present

## 2013-11-19 DIAGNOSIS — N179 Acute kidney failure, unspecified: Secondary | ICD-10-CM

## 2013-11-19 DIAGNOSIS — Z905 Acquired absence of kidney: Secondary | ICD-10-CM

## 2013-11-19 DIAGNOSIS — F3289 Other specified depressive episodes: Secondary | ICD-10-CM | POA: Diagnosis present

## 2013-11-19 DIAGNOSIS — I10 Essential (primary) hypertension: Secondary | ICD-10-CM | POA: Diagnosis present

## 2013-11-19 DIAGNOSIS — F329 Major depressive disorder, single episode, unspecified: Secondary | ICD-10-CM | POA: Diagnosis present

## 2013-11-19 DIAGNOSIS — Z85118 Personal history of other malignant neoplasm of bronchus and lung: Secondary | ICD-10-CM

## 2013-11-19 DIAGNOSIS — R259 Unspecified abnormal involuntary movements: Secondary | ICD-10-CM | POA: Diagnosis present

## 2013-11-19 DIAGNOSIS — Z87891 Personal history of nicotine dependence: Secondary | ICD-10-CM

## 2013-11-19 DIAGNOSIS — N390 Urinary tract infection, site not specified: Secondary | ICD-10-CM

## 2013-11-19 DIAGNOSIS — E860 Dehydration: Secondary | ICD-10-CM | POA: Diagnosis present

## 2013-11-19 DIAGNOSIS — K921 Melena: Secondary | ICD-10-CM

## 2013-11-19 DIAGNOSIS — R109 Unspecified abdominal pain: Secondary | ICD-10-CM

## 2013-11-19 DIAGNOSIS — E039 Hypothyroidism, unspecified: Secondary | ICD-10-CM | POA: Diagnosis present

## 2013-11-19 DIAGNOSIS — Z6838 Body mass index (BMI) 38.0-38.9, adult: Secondary | ICD-10-CM

## 2013-11-19 DIAGNOSIS — Z8542 Personal history of malignant neoplasm of other parts of uterus: Secondary | ICD-10-CM

## 2013-11-19 DIAGNOSIS — K625 Hemorrhage of anus and rectum: Principal | ICD-10-CM | POA: Diagnosis present

## 2013-11-19 DIAGNOSIS — I509 Heart failure, unspecified: Secondary | ICD-10-CM | POA: Diagnosis present

## 2013-11-19 LAB — CBC/DIFF
BASOPHILS: 1 %
BASOS ABS: 0.104 10*3/uL (ref 0.000–0.200)
EOS ABS: 0.382 10*3/uL (ref 0.000–0.500)
EOSINOPHIL: 4 %
HCT: 32.7 % — ABNORMAL LOW (ref 33.5–45.2)
HGB: 11.1 g/dL — ABNORMAL LOW (ref 11.2–15.2)
LYMPHOCYTES: 26 %
LYMPHS ABS: 2.611 10*3/uL (ref 1.000–4.800)
MCH: 28.3 pg (ref 27.4–33.0)
MCHC: 33.8 g/dL (ref 32.5–35.8)
MCV: 83.8 fL (ref 78–100)
MONOCYTES: 10 %
MONOS ABS: 1.013 THOU/uL — ABNORMAL HIGH (ref 0.300–1.000)
MPV: 8.3 fL (ref 7.5–11.5)
PLATELET COUNT: 294 10*3/uL (ref 140–450)
PMN ABS: 5.849 THOU/uL (ref 1.500–7.700)
PMN'S: 59 %
RBC: 3.91 MIL/uL (ref 3.63–4.92)
RDW: 14.4 % (ref 12.0–15.0)
WBC: 10 THOU/uL (ref 3.5–11.0)

## 2013-11-19 LAB — PT/INR
INR: 0.97 (ref 0.80–1.20)
PROTHROMBIN TIME: 10.6 s (ref 8.7–13.1)
PROTHROMBIN TIME: 10.6 s (ref 8.7–13.1)

## 2013-11-19 LAB — UREA NITROGEN, RANDOM URINE: UREA NITROGEN, RANDOM: 269 mg/dL — AB

## 2013-11-19 LAB — BASIC METABOLIC PANEL
ANION GAP: 11 mmol/L (ref 4–13)
BUN/CREAT RATIO: 21 (ref 6–22)
BUN: 33 mg/dL — ABNORMAL HIGH (ref 8–25)
CALCIUM: 9.2 mg/dL (ref 8.5–10.4)
CARBON DIOXIDE: 19 mmol/L — ABNORMAL LOW (ref 22–32)
CHLORIDE: 110 mmol/L (ref 96–111)
CREATININE: 1.55 mg/dL — ABNORMAL HIGH (ref 0.49–1.10)
ESTIMATED GLOMERULAR FILTRATION RATE: 35 mL/min/{1.73_m2} — ABNORMAL LOW (ref >59)
GLUCOSE,NONFAST: 202 mg/dL — ABNORMAL HIGH (ref 65–139)
GLUCOSE,NONFAST: 202 mg/dL — ABNORMAL HIGH (ref 65–139)
POTASSIUM: 4.3 mmol/L (ref 3.5–5.1)
SODIUM: 140 mmol/L (ref 136–145)

## 2013-11-19 LAB — HEPATIC FUNCTION PANEL
ALBUMIN: 2.7 g/dL — ABNORMAL LOW (ref 3.4–4.8)
ALKALINE PHOSPHATASE: 133 U/L (ref <150)
ALT (SGPT): 16 U/L (ref <55)
AST (SGOT): 13 U/L (ref 8–41)
BILIRUBIN, TOTAL: 0.4 mg/dL (ref 0.3–1.3)
BILIRUBIN, TOTAL: 0.4 mg/dL (ref 0.3–1.3)
BILIRUBIN,CONJUGATED: <0.2 mg/dL (ref <0.3)
TOTAL PROTEIN: 7.4 g/dL (ref 6.0–8.0)

## 2013-11-19 LAB — URINALYSIS WITH MICROSCOPIC REFLEX IF INDICATED
KETONES: NEGATIVE mg/dL
KETONES: NEGATIVE mg/dL
PH URINE: 7 (ref 5.0–8.0)
SPECIFIC GRAVITY, URINE: 1.014 (ref 1.005–1.030)
UROBILINOGEN: NORMAL mg/dL

## 2013-11-19 LAB — PHOSPHORUS
PHOSPHORUS: 3.8 mg/dL (ref 2.3–4.0)
PHOSPHORUS: 3.8 mg/dL (ref 2.3–4.0)

## 2013-11-19 LAB — TYPE AND SCREEN
ABO/RH(D): A POS
ANTIBODY SCREEN: NEGATIVE

## 2013-11-19 LAB — URINALYSIS, MICROSCOPIC
RBC'S: 23 /HPF — ABNORMAL HIGH (ref 0–4)
WBC'S: >182 /HPF — ABNORMAL HIGH (ref 0–6)

## 2013-11-19 LAB — URINALYSIS MACROSCOPIC WITH REFLEX TO MICROSCOPIC URINALYSIS (CULTURE NOT PERFORMED)

## 2013-11-19 LAB — PERFORM POC WHOLE BLOOD GLUCOSE
GLUCOSE, POINT OF CARE: 190 mg/dL — ABNORMAL HIGH (ref 70–105)
GLUCOSE, POINT OF CARE: 201 mg/dL — ABNORMAL HIGH (ref 70–105)
GLUCOSE, POINT OF CARE: 161 mg/dL — ABNORMAL HIGH (ref 70–105)
GLUCOSE, POINT OF CARE: 224 mg/dL — ABNORMAL HIGH (ref 70–105)
GLUCOSE, POINT OF CARE: 127 mg/dL — ABNORMAL HIGH (ref 70–105)

## 2013-11-19 LAB — PTT (PARTIAL THROMBOPLASTIN TIME)
APTT: 32.2 Sec (ref 25.1–36.5)
APTT: 32.2 s (ref 25.1–36.5)

## 2013-11-19 LAB — CREATININE URINE, RANDOM: CREATININE, UR RAND: 40 mg/dL — AB

## 2013-11-19 LAB — MAGNESIUM: MAGNESIUM: 1.4 mg/dL — ABNORMAL LOW (ref 1.6–2.5)

## 2013-11-19 LAB — SODIUM, RANDOM URINE: SODIUM, URINE RAND: 80 mmol/L — AB

## 2013-11-19 LAB — C-REACTIVE PROTEIN(CRP),INFLAMMATION: C-REACTIVE PROTEIN (CRP),INFLAMMATION: 2.84 mg/dL — ABNORMAL HIGH (ref <0.8)

## 2013-11-19 MED ORDER — PROMETHAZINE 25 MG TABLET
25.00 mg | ORAL_TABLET | Freq: Three times a day (TID) | ORAL | Status: DC | PRN
Start: 2013-11-19 — End: 2013-11-24
  Administered 2013-11-21: 25 mg via ORAL
  Filled 2013-11-19: qty 1

## 2013-11-19 MED ORDER — CLONIDINE 0.1 MG/24 HR WEEKLY TRANSDERMAL PATCH
0.10 mg | MEDICATED_PATCH | TRANSDERMAL | Status: DC
Start: 2013-11-20 — End: 2013-11-24
  Administered 2013-11-20: 0.1 mg via TRANSDERMAL
  Filled 2013-11-19: qty 1

## 2013-11-19 MED ORDER — SODIUM CHLORIDE 0.9 % (FLUSH) INJECTION SYRINGE
2.0000 mL | INJECTION | Freq: Three times a day (TID) | INTRAMUSCULAR | Status: DC
Start: 2013-11-19 — End: 2013-11-24
  Administered 2013-11-19: 2 mL
  Administered 2013-11-19: 0 mL
  Administered 2013-11-19 – 2013-11-20 (×3): 2 mL
  Administered 2013-11-21: 0 mL
  Administered 2013-11-21: 2 mL
  Administered 2013-11-22: 0 mL
  Administered 2013-11-22 (×2): 2 mL
  Administered 2013-11-23 – 2013-11-24 (×5): 0 mL

## 2013-11-19 MED ORDER — MORPHINE 2 MG/ML INTRAVENOUS CARTRIDGE
2.0000 mg | CARTRIDGE | INTRAVENOUS | Status: DC | PRN
Start: 2013-11-19 — End: 2013-11-24
  Administered 2013-11-22: 2 mg via INTRAVENOUS
  Filled 2013-11-19: qty 1

## 2013-11-19 MED ORDER — INSULIN LISPRO 100 UNIT/ML SUB-Q SSIP
3.00 [IU] | INJECTION | Freq: Four times a day (QID) | SUBCUTANEOUS | Status: DC | PRN
Start: 2013-11-19 — End: 2013-11-24
  Administered 2013-11-19 (×2): 6 [IU] via SUBCUTANEOUS
  Administered 2013-11-20: 9 [IU] via SUBCUTANEOUS
  Administered 2013-11-20: 3 [IU] via SUBCUTANEOUS
  Administered 2013-11-20: 6 [IU] via SUBCUTANEOUS
  Administered 2013-11-21 (×2): 9 [IU] via SUBCUTANEOUS
  Administered 2013-11-23: 6 [IU] via SUBCUTANEOUS
  Administered 2013-11-24: 3 [IU] via SUBCUTANEOUS
  Filled 2013-11-19: qty 3

## 2013-11-19 MED ORDER — SENNOSIDES 8.6 MG-DOCUSATE SODIUM 50 MG TABLET
1.0000 | ORAL_TABLET | Freq: Two times a day (BID) | ORAL | Status: DC | PRN
Start: 2013-11-19 — End: 2013-11-19
  Filled 2013-11-19: qty 1

## 2013-11-19 MED ORDER — CEFTRIAXONE 1 GRAM/50 ML IN DEXTROSE (ISO-OSMOT) INTRAVENOUS PIGGYBACK
1.0000 g | INJECTION | INTRAVENOUS | Status: DC
Start: 2013-11-19 — End: 2013-11-21
  Administered 2013-11-19: 0 g via INTRAVENOUS
  Administered 2013-11-19 – 2013-11-21 (×3): 1 g via INTRAVENOUS
  Filled 2013-11-19 (×3): qty 50

## 2013-11-19 MED ORDER — PANTOPRAZOLE 20 MG TABLET,DELAYED RELEASE
20.00 mg | DELAYED_RELEASE_TABLET | Freq: Two times a day (BID) | ORAL | Status: DC
Start: 2013-11-19 — End: 2013-11-24
  Administered 2013-11-19 – 2013-11-24 (×11): 20 mg via ORAL
  Filled 2013-11-19 (×12): qty 1

## 2013-11-19 MED ORDER — SENNOSIDES 8.6 MG-DOCUSATE SODIUM 50 MG TABLET
1.0000 | ORAL_TABLET | Freq: Two times a day (BID) | ORAL | Status: DC
Start: 2013-11-19 — End: 2013-11-24
  Administered 2013-11-19 – 2013-11-21 (×6): 1 via ORAL
  Administered 2013-11-22: 0 via ORAL
  Administered 2013-11-22 – 2013-11-24 (×4): 1 via ORAL
  Filled 2013-11-19 (×12): qty 1

## 2013-11-19 MED ORDER — TRAMADOL 50 MG TABLET
50.00 mg | ORAL_TABLET | Freq: Three times a day (TID) | ORAL | Status: DC | PRN
Start: 2013-11-19 — End: 2013-11-24

## 2013-11-19 MED ORDER — CITALOPRAM 40 MG TABLET
40.0000 mg | ORAL_TABLET | Freq: Every day | ORAL | Status: DC
Start: 2013-11-19 — End: 2013-11-24
  Administered 2013-11-19 – 2013-11-24 (×6): 40 mg via ORAL
  Filled 2013-11-19 (×6): qty 1

## 2013-11-19 MED ORDER — LEVOTHYROXINE 25 MCG TABLET
25.00 ug | ORAL_TABLET | Freq: Every morning | ORAL | Status: DC
Start: 2013-11-19 — End: 2013-11-24
  Administered 2013-11-19 – 2013-11-24 (×6): 25 ug via ORAL
  Filled 2013-11-19 (×7): qty 1

## 2013-11-19 MED ORDER — METOCLOPRAMIDE 5 MG TABLET
5.00 mg | ORAL_TABLET | Freq: Three times a day (TID) | ORAL | Status: DC
Start: 2013-11-19 — End: 2013-11-24
  Administered 2013-11-19 (×3): 5 mg via ORAL
  Administered 2013-11-20: 0 mg via ORAL
  Administered 2013-11-20 – 2013-11-22 (×7): 5 mg via ORAL
  Administered 2013-11-22: 0 mg via ORAL
  Administered 2013-11-23 – 2013-11-24 (×5): 5 mg via ORAL
  Filled 2013-11-19 (×18): qty 1

## 2013-11-19 MED ORDER — SODIUM CHLORIDE 0.9 % (FLUSH) INJECTION SYRINGE
2.0000 mL | INJECTION | INTRAMUSCULAR | Status: DC | PRN
Start: 2013-11-19 — End: 2013-11-24

## 2013-11-19 MED ORDER — SODIUM CHLORIDE 0.9 % INTRAVENOUS SOLUTION
INTRAVENOUS | Status: DC
Start: 2013-11-19 — End: 2013-11-20
  Administered 2013-11-20: 0 via INTRAVENOUS

## 2013-11-19 MED ORDER — POLYETHYLENE GLYCOL 3350 17 GRAM ORAL POWDER PACKET
17.0000 g | Freq: Every day | ORAL | Status: DC | PRN
Start: 2013-11-19 — End: 2013-11-24
  Administered 2013-11-19 – 2013-11-23 (×2): 17 g via ORAL
  Filled 2013-11-19 (×8): qty 1

## 2013-11-19 MED ORDER — INSULIN GLARGINE (U-100) 100 UNIT/ML SUBCUTANEOUS SOLUTION
50.00 [IU] | Freq: Every evening | SUBCUTANEOUS | Status: DC
Start: 2013-11-19 — End: 2013-11-19

## 2013-11-19 MED ORDER — AMLODIPINE 5 MG TABLET
5.0000 mg | ORAL_TABLET | Freq: Every day | ORAL | Status: DC
Start: 2013-11-19 — End: 2013-11-21
  Administered 2013-11-19 – 2013-11-21 (×3): 5 mg via ORAL
  Filled 2013-11-19 (×3): qty 1

## 2013-11-19 MED ORDER — ACETAMINOPHEN 325 MG TABLET
650.0000 mg | ORAL_TABLET | ORAL | Status: DC | PRN
Start: 2013-11-19 — End: 2013-11-24

## 2013-11-19 MED ORDER — SIMVASTATIN 20 MG TABLET
40.00 mg | ORAL_TABLET | Freq: Every evening | ORAL | Status: DC
Start: 2013-11-19 — End: 2013-11-24
  Administered 2013-11-19 – 2013-11-23 (×7): 40 mg via ORAL
  Filled 2013-11-19 (×7): qty 2

## 2013-11-19 MED ORDER — INSULIN GLARGINE (U-100) 100 UNIT/ML SUBCUTANEOUS SOLUTION
30.00 [IU] | Freq: Two times a day (BID) | SUBCUTANEOUS | Status: DC
Start: 2013-11-19 — End: 2013-11-24
  Administered 2013-11-19 – 2013-11-24 (×11): 30 [IU] via SUBCUTANEOUS
  Filled 2013-11-19 (×12): qty 30

## 2013-11-19 MED ADMIN — albumin, human 5 % intravenous solution: ORAL | @ 09:00:00

## 2013-11-19 NOTE — Care Plan (Signed)
Problem: General Plan of Care(Adult,OB)  Goal: Plan of Care Review(Adult,OB)  The patient and/or their representative will communicate an understanding of their plan of care   Outcome: Ongoing (see interventions/notes)  No acute events this shift.  GI consulted and discussed possibility of scope with pt.  IV fluids continued and pt advanced to clear liquid diet.  PRN stool softeners ordered for constipation.  Pt and family updated on plan of care.  Will continue to monitor.

## 2013-11-19 NOTE — Consults (Addendum)
[facility]  Digestive Diseases Consult      Christy Hale, Christy Hale, 73 y.o. female  Date of Admission:  11/19/2013  Date of service: 11/19/2013  Date of Birth:  August 06, 1941    Hospital Day:  LOS: 0 days     Service: Medicine 2    Requesting MD: Dr.Sofka    Information Obtained from: patient, daughter and history reviewed via medical record    Chief Complaint:  Rectal bleeding, Anemia    Assessment/Recommendations: Rectal bleeding, Anemia  73 y/o F with  medical history significant for CHF, HTN, DM, seizures, TCC s/p left nephrectomy, cystectomy, hysterectomy, s/p ileal conduit and ?possible lung cancer s/p LLL resection now with constipation and 1 bowel movement which was BRB.   CT scan in outside hospital showed right abdominal wall mass.     - Will plan for Colonscopy on Tuesday 11/22/13  - Please consult Anesthesia.  - Continue monitoring H & H closely. Transfuse as needed( Keep Hgb >7 ).   - Start Golytely ( 1 gallon) on 11/21/13 for bowel prep .   - Clear liquids staring from 11/21/13 until MN on that day  - NPO after MN on 11/21/13  - Please keep INR <1.5 and Plts >50,000  - Avoid antiplatelets/anticoagulants if possible.  - Please check stool cultures- C Diff given recent use of abx.  - Please call GI fellow on call if patient has an acute GIB bleed or develops hemodynamic instability.    This patient was seen and discussed with Dr.Annitta Fifield who agrees with the above assessment and plan.   Thank you for this consult. Please do not hesitate to call with any questions or concerns.         HPI/Discussion:  Christy Hale is a 74 y.o., White female with medical history significant for CHF, HTN, DM, seizures, TCC s/p left nephrectomy, cystectomy, hysterectomy, s/p ileal conduit and ?possible lung cancer s/p LLL resection now presented after having 1 episode of rectal bleeding.   For the past 3 weeks , she has noticed constipation . She has been using suppositories/enemas as well as laxatives. The day  prior to presenting to the hospital she had abdominal discomfort and had a BM following that. No stool seen but had BRBPR which was in the toilet as well as on the toilet wipe.  Never had rectal bleeding in the past.  No other GI symptoms.   She has been on Abx recently -Amoxicillin .   No NSAID's use noted.   No diarrhea or melena.   DRE here was hemoccut negative as well as no gross blood.   Never had a Colonsocopy done .   No FH of colon cancer .        Past Medical History   Diagnosis Date    Hypertension     Ulcer disease     Lung cancer      left lung, unknown type    Arthritis     Diabetes mellitus     Hyperlipidemia     History of transfusion      no reaction    GERD (gastroesophageal reflux disease)      hx of, no longer takes meds    Kidney disease      s/p nephrectomy due to cancer    cancer      s/p renal cancer, lung cancer, skin cancer, and now with bladder cancer    Shortness of breath      with humidity  Seizures      x 3 2012 none since then    Muscle weakness     Urinary tract infection     Type 2 diabetes mellitus      dx 20 + yrs ago, fasting up to 311 low during night    Morbid obesity     H/O carcinoma of bladder      Past Surgical History   Procedure Laterality Date    Hx hysterectomy      Hx hand surgery       bilateral    Hx foot surgery       right    Hx other       left arm surgery    Hx nephrectomy       with ureterectomy (left) left nephrectomy    Hx other       left lung cancer removal    Hx laser eye surgery       laser OU for DM    Hx cataract removal  06/10/2012     CE IOL OD w/avastin    Hx cataract removal Left 07/13/12     CE IOL OS    Hx cystectomy  12/31/2010     radical cystectomy      Medications Prior to Admission    Outpatient Medications    amoxicillin (AMOXIL) 500 mg Oral Capsule    Take 500 mg by mouth Twice daily    Cholecalciferol, Vitamin D3, (VITAMIN D-3) 400 unit Oral Capsule    Take 1 Tab by mouth Once a day    citalopram (CELEXA) 40 mg  Oral Tablet    Take 40 mg by mouth Once a day    cloNIDine (CATAPRES-TTS) 0.1 mg/24 hr Transdermal Patch Weekly    ferrous sulfate (FERATAB) 324 mg (65 mg iron) Oral Tablet, Delayed Release (E.C.)    Take 324 mg by mouth Every morning before breakfast    furosemide (LASIX) 20 mg Oral Tablet    20 mg Every other day     INSULIN GLARGINE,HUM.REC.ANLOG (LANTUS SUBQ)    50 Units by Subcutaneous route Every night     INSULIN LISPRO (HUMALOG SUBQ)    by Subcutaneous route Twice daily On a sliding scale  Takes 22 to 28 units on sliding scale    KLOR-CON M20 20 mEq Oral Tab Sust.Rel. Particle/Crystal    20 mEq Every 48 hours     levothyroxine (SYNTHROID) 25 mcg Oral Tablet    Take 25 mcg by mouth Once a day     metoclopramide HCl (REGLAN) 5 mg Oral Tablet    5 mg Three times daily before meals     NIACIN, INOSITOL NIACINATE, ORAL    take 500 mg by mouth Once a day.    omeprazole (PRILOSEC) 20 mg Oral Capsule, Delayed Release(E.C.)    Take 20 mg by mouth Twice daily     promethazine (PHENERGAN) 25 mg Oral Tablet    Take 25 mg by mouth Three times a day as needed    sennosides-docusate sodium (SENOKOT-S) 8.6-50 mg Oral Tablet    take 1 Tab by mouth QPM.    simvastatin (ZOCOR) 40 mg Oral Tablet    40 mg Once a day     traMADol (ULTRAM) 50 mg Oral Tablet    Three times a day as needed One tab at bedtime          Current Facility-Administered Medications:  acetaminophen (TYLENOL) tablet 650 mg Oral Q4H PRN  amLODIPine (NORVASC) tablet 5 mg Oral Daily   cefTRIAXone (ROCEPHIN) 1 g in iso-osmotic 50 mL premix IVPB 1 g Intravenous Q24H   citalopram (CELEXA) tablet 40 mg Oral Daily   [START ON 11/20/2013] cloNIDine (CATAPRES-TTS) transdermal patch (mg/24 hr) 0.1 mg Transdermal Q7 Days   insulin glargine (LANTUS) 100 units/mL injection 30 Units Subcutaneous 2x/day   levothyroxine (SYNTHROID) tablet 25 mcg Oral QAM   metoclopramide (REGLAN) tablet 5 mg Oral 3x/day AC   morphine 2 mg/mL injection 2 mg Intravenous Q4H PRN   NS flush  syringe 2 mL Intracatheter Q8HRS   And      NS flush syringe 2-6 mL Intracatheter Q1 MIN PRN   NS premix infusion  Intravenous Continuous   pantoprazole (PROTONIX) delayed release tablet 20 mg Oral 2x/day AC   polyethylene glycol (MIRALAX) oral packet 17 g Oral Daily PRN   promethazine (PHENERGAN) tablet 25 mg Oral 3x/day PRN   sennosides-docusate sodium (SENOKOT-S) 8.6-50mg per tablet 1 Tab Oral 2x/day   sennosides-docusate sodium (SENOKOT-S) 8.6-50mg per tablet 1 Tab Oral 2x/day PRN   simvastatin (ZOCOR) tablet 40 mg Oral QPM   SSIP insulin lispro (HUMALOG) 100 units/mL injection 3-9 Units Subcutaneous 4x/day PRN   traMADol (ULTRAM) tablet 50 mg Oral 3x/day PRN     Allergies   Allergen Reactions    Glucophage [Metformin]        Family History  Family History   Problem Relation Age of Onset    Diabetes Mother     Diabetes Father     Hypertension Mother     Hypertension Father     Coronary Artery Disease Mother     Lung Cancer Sister      smoker       Social History  History   Substance Use Topics    Smoking status: Former Smoker -- 3.00 packs/day for 15 years     Types: Cigarettes     Quit date: 10/20/1981    Smokeless tobacco: Never Used    Alcohol Use: Yes      Comment: wine rarely        ROS:   Other than ROS in the HPI, all other systems were negative.    EXAM:  Temperature: 36.5 C (97.7 F)  Heart Rate: 77  BP (Non-Invasive): 168/65 mmHg  Respiratory Rate: 20  SpO2-1: 97 %  Pain Score (Numeric, Faces): 0  General : in no acute distress.   HEENT: No scleral icterus , no pallor . No neck masses noted.   Lungs: Clear to auscultation bilaterally . No rhonchi or rales noted.   Cardiovascular: S1,S2. Regular rate and rhythm. No murmurs.   Abdomen: Soft, non tender , non distended. Normoactive bowel sounds noted . Scar on LUQ as well as ileal conduit in place. DRE showed yellow stool.  Extremities: No cyanosis, clubbing or edema.   Neurological : Alert, oriented x3. No gross focal neurological deficits  noted.  Skin: No rashes or skin lesions noted.   Psychiatric: Normal affect ,mood and good insight.   Hematological :  No bruising or petechiae.      Labs:    BMP:     Recent Labs      11/19/13   0445   SODIUM  140   POTASSIUM  4.3   CHLORIDE  110   CO2  19*   BUN  33*   CREATININE  1.55*   GLUCOSENF  202*   ANIONGAP  11   BUNCRRATIO  21  GFR  35*   CALCIUM  9.2     CBC Results Differential Results   Recent Labs      11/19/13   0445   WBC  10.0   HGB  11.1*   HCT  32.7*   PLTCNT  294    Recent Results (from the past 30 hour(s))   CBC/DIFF    Collection Time     11/19/13  4:45 AM       Result Value    WBC 10.0     PMN'S 59     LYMPHOCYTES 26     MONOCYTES 10     EOSINOPHIL 4     BASOPHILS 1         Hepatic Function:    Recent Labs      11/19/13   0445  11/19/13   0446   TOTALPROTEIN  7.4   --    ALBUMIN  2.7*   --    TOTBILIRUBIN  0.4   --    BILIRUBINCON  <0.2   --    UROBILINOGEN   --   NORMAL   AST  13   --    ALT  16   --    ALKPHOS  133   --      ESR(automated)/C-protein:   Recent Labs      11/19/13   0445   CREAPROINFLA  2.84*     PT:    Recent Labs      11/19/13   0445   PROTHROMTME  10.6     INR:     Recent Labs      11/19/13   0445   INR  0.97     Last 3 Cardiac Markers:  No results found for this encounter  Lactic Acid:  No results found for this encounter    Imaging Studies:  N/A    Bernadene Bell, MD 11/19/2013, 13:18      11/20/2013    Late entry for 11/19/13    I saw and examined the patient.  I reviewed the fellow's note.  I agree with the findings and plan of care as documented in the fellow's note.  Any exceptions/additions are edited/noted.    Damita Dunnings, MD 11/20/2013, 08:12

## 2013-11-19 NOTE — Nurses Notes (Signed)
Patient is a 73 year old female admitted from [facility] via stretcher. See flowsheet for VSs and assessment. Patient oriented to bed and room, instructed to call for any needs or concerns. Call bell within reach.

## 2013-11-19 NOTE — Nurses Notes (Signed)
FS 161.  Pt refused insulin at this time.

## 2013-11-19 NOTE — H&P (Addendum)
[name]   General Medicine  Admission H&P    Date of Service:  11/19/2013  Christy, Hale, 73 y.o. female  Date of Admission:  11/19/2013  Date of Birth:  January 20, 1941  PCP: Timmie Foerster, DO    Information Obtained from: Patient, Daughter Christy Hale  Chief Complaint:  BRBPR    HPI:   Christy Hale is a 73 y.o., White female with history of CHF, HTN, DM, seizures, TCC s/p left nephrectomy, cystectomy, hysterectomy, and ?possible lung cancer s/p LLL resection who was transferred from [facility] for [facility]. Patient had been having constipation for the past two weeks and had been taking laxatives. She has some chronic nausea, including an episode of emesis 2 days ago, but she otherwise felt well. Yesterday, her roommate noticed some blood on the back of her nightgown that was apparently also there the night before. Patient went to the bathroom to wipe her rectum and found bright red blood. She did not have a bowel movement. Her undergarments also had blood, and her family brought her to the [facility] ED. CT abdomen/pelvis there showed right renal collecting system and ureter distension as well as right abdominal wall mass and RLL nodule. Labs showed Hgb 10.9, Creatinine 1.8. UA showed leuks, bacteria, protein, and blood in the urine. Occult stool test was positive. She received 1x rocephin. She was then transferred to [facility] for further management. At present, patient notes a mild RLQ pain that radiates to the back. She denies any nausea, abdominal pain, lightheadedness, fatigue, fevers, chills, night sweats, shortness of breath, chest pain, or other symptoms. She has not had a bowel movement since getting to the outside hospital. They had placed a pad in her underwear, and it currently has a very small amount of dried blood.     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 She had a left nephroureterectomy done by Dr. Cathrine Muster in 2008. A cystoscopy at  [facility] in Jan 2012 found another tumor requiring cystectomy and ileal conduit; that was done here in March 2012. She had some later complications with AKI secondary to obstruction, bleeding stoma, and UTIs; they were each resolved.     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 No documentation of this and unclear if this was metastatic from TCC or different malignancy.     Past Medical & Surgical History:    Past Medical History   Diagnosis Date    Hypertension     Ulcer disease     Lung cancer      left lung, unknown type    Arthritis     Diabetes mellitus     Hyperlipidemia     History of transfusion      no reaction    GERD (gastroesophageal reflux disease)      hx of, no longer takes meds    Kidney disease      s/p nephrectomy due to cancer    cancer      s/p renal cancer, lung cancer, skin cancer, and now with bladder cancer    Shortness of breath      with humidity    Seizures      x 3 2012 none since then    Muscle weakness     Urinary tract infection     Type 2 diabetes mellitus      dx  20 + yrs ago, fasting up to 311 low during night    Morbid obesity     H/O carcinoma of bladder     Past Surgical History   Procedure Laterality Date    Hx hysterectomy      Hx hand surgery       bilateral    Hx foot surgery       right    Hx other       left arm surgery    Hx nephrectomy       with ureterectomy (left) left nephrectomy    Hx other       left lung cancer removal    Hx laser eye surgery       laser OU for DM    Hx cataract removal  06/10/2012     CE IOL OD w/avastin    Hx cataract removal Left 07/13/12     CE IOL OS    Hx cystectomy  12/31/2010     radical cystectomy         Medications:  No Facility-Administered Medications for the 11/19/13 encounter [facility] Encounter).     Outpatient Prescriptions Marked as Taking for the 11/19/13 encounter [facility] Encounter)   Medication Sig Dispense  Refill    Cholecalciferol, Vitamin D3, (VITAMIN D-3) 400 unit Oral Capsule Take 1 Tab by mouth Once a day        citalopram (CELEXA) 40 mg Oral Tablet Take 40 mg by mouth Once a day        cloNIDine (CATAPRES-TTS) 0.1 mg/24 hr Transdermal Patch Weekly         ferrous sulfate (FERATAB) 324 mg (65 mg iron) Oral Tablet, Delayed Release (E.C.) Take 324 mg by mouth Every morning before breakfast        furosemide (LASIX) 20 mg Oral Tablet 20 mg Every other day         INSULIN GLARGINE,HUM.REC.ANLOG (LANTUS SUBQ) 50 Units by Subcutaneous route Every night         levothyroxine (SYNTHROID) 25 mcg Oral Tablet Take 25 mcg by mouth Once a day         metoclopramide HCl (REGLAN) 5 mg Oral Tablet 5 mg Three times daily before meals         omeprazole (PRILOSEC) 20 mg Oral Capsule, Delayed Release(E.C.) Take 20 mg by mouth Twice daily         promethazine (PHENERGAN) 25 mg Oral Tablet Take 25 mg by mouth Three times a day as needed        sennosides-docusate sodium (SENOKOT-S) 8.6-50 mg Oral Tablet take 1 Tab by mouth QPM.  30 Tab  0    simvastatin (ZOCOR) 40 mg Oral Tablet 40 mg Once a day         traMADol (ULTRAM) 50 mg Oral Tablet Three times a day as needed One tab at bedtime           Allergies:  Allergies   Allergen Reactions    Glucophage [Metformin]        Family History:  Family History   Problem Relation Age of Onset    Diabetes Mother     Diabetes Father     Hypertension Mother     Hypertension Father     Coronary Artery Disease Mother     Lung Cancer Sister      smoker       Social History:  History   Substance Use Topics    Smoking status: Former  Smoker -- 3.00 packs/day for 15 years     Types: Cigarettes     Quit date: 10/20/1981    Smokeless tobacco: Never Used    Alcohol Use: Yes      Comment: wine rarely        ROS:    Review of Systems   Constitutional: Negative for fever, chills, weight loss and malaise/fatigue.   HENT: Negative for congestion and sore throat.    Eyes: Negative for  blurred vision.   Respiratory: Negative for cough, hemoptysis, sputum production, shortness of breath and wheezing.    Cardiovascular: Negative for chest pain, palpitations and leg swelling.   Gastrointestinal: Positive for nausea, vomiting, abdominal pain, constipation and blood in stool. Negative for heartburn, diarrhea and melena.   Genitourinary: Negative for dysuria, urgency, frequency, hematuria and flank pain.   Musculoskeletal: Negative for falls and myalgias.   Skin: Negative for itching and rash.   Neurological: Negative for dizziness, tingling, tremors, loss of consciousness and headaches.   Endo/Heme/Allergies: Does not bruise/bleed easily.          Examination:  Temperature: 36.7 C (98.1 F) Heart Rate: 68 BP (Non-Invasive): 172/71 mmHg   Respiratory Rate: 18 SpO2-1: 98 % Pain Score (Numeric, Faces): 3   General: appears chronically ill and no distress  Eyes: Conjunctiva clear., Pupils equal and round. , Sclera non-icteric.   HENT:Head atraumatic and normocephalic  Neck: No JVD or thyromegaly  Lungs: Clear to auscultation bilaterally.   Cardiovascular: regular rate and rhythm  Abdomen: Soft, non-distended, very mildly TTP in RLQ  GI: Hemoccult negative. No external hemorrhoids noted.   Extremities: No cyanosis or edema  Skin: Skin warm and dry  Neurologic: Grossly normal, Alert and oriented x3  Lymphatics: No lymphadenopathy  Psychiatric: Normal    Labs:    Lab Results for Last 24 Hours:    Results for orders placed during the hospital encounter of 11/19/13 (from the past 24 hour(s))   TYPE AND SCREEN       Result Value Range    UNITS ORDERED NOT STATED          ABO/RH(D) A POSITIVE      ANTIBODY SCREEN NEGATIVE      SPECIMEN EXPIRATION DATE 11/22/2013     CBC/DIFF       Result Value Range    WBC 10.0  3.5 - 11.0 THOU/uL    RBC 3.91  3.63 - 4.92 MIL/uL    HGB 11.1 (*) 11.2 - 15.2 g/dL    HCT 32.7 (*) 33.5 - 45.2 %    MCV 83.8  78 - 100 fL    MCH 28.3  27.4 - 33.0 pg    MCHC 33.8  32.5 - 35.8 g/dL     RDW 14.4  12.0 - 15.0 %    PLATELET COUNT 294  140 - 450 THOU/uL    MPV 8.3  7.5 - 11.5 fL    PMN'S 59      PMN ABS 5.849  1.500 - 7.700 THOU/uL    LYMPHOCYTES 26      LYMPHS ABS 2.611  1.000 - 4.800 THOU/uL    MONOCYTES 10      MONOS ABS 1.013 (*) 0.300 - 1.000 THOU/uL    EOSINOPHIL 4      EOS ABS 0.382  0.000 - 0.500 THOU/uL    BASOPHILS 1      BASOS ABS 0.104  0.000 - 0.200 THOU/uL   BASIC METABOLIC PANEL, NON-FASTING  Result Value Range    SODIUM 140  136 - 145 mmol/L    POTASSIUM 4.3  3.5 - 5.1 mmol/L    CHLORIDE 110  96 - 111 mmol/L    CARBON DIOXIDE 19 (*) 22 - 32 mmol/L    ANION GAP 11  4 - 13 mmol/L    CREATININE 1.55 (*) 0.49 - 1.10 mg/dL    ESTIMATED GLOMERULAR FILTRATION RATE 35 (*) >59 ml/min/1.36m2    GLUCOSE,NONFAST 202 (*) 65 - 139 mg/dL    BUN 33 (*) 8 - 25 mg/dL    BUN/CREAT RATIO 21  6 - 22    CALCIUM 9.2  8.5 - 10.4 mg/dL   MAGNESIUM       Result Value Range    MAGNESIUM 1.4 (*) 1.6 - 2.5 mg/dL   PHOSPHORUS       Result Value Range    PHOSPHORUS 3.8  2.3 - 4.0 mg/dL   PT/INR       Result Value Range    PROTHROMBIN TIME 10.6  8.7 - 13.1 Sec    INR 0.97  0.80 - 1.20   PTT (PARTIAL THROMBOPLASTIN TIME)       Result Value Range    APTT 32.2  25.1 - 36.5 Sec   HEPATIC FUNCTION PANEL       Result Value Range    ALBUMIN 2.7 (*) 3.4 - 4.8 g/dL    BILIRUBIN, TOTAL 0.4  0.3 - 1.3 mg/dL    ALKALINE PHOSPHATASE 133  <150 U/L    AST (SGOT) 13  8 - 41 U/L    ALT (SGPT) 16  <55 U/L    BILIRUBIN,CONJUGATED <0.2  <0.3 mg/dL    TOTAL PROTEIN 7.4  6.0 - 8.0 g/dL   C-REACTIVE PROTEIN(CRP),INFLAMMATION       Result Value Range    C-REACTIVE PROTEIN HIGH SENSITIVITY (INFLAMMATION) 2.84 (*) <0.8 mg/dL   URINALYSIS WITH CULTURE REFLEX       Result Value Range    CHARACTER TURBID      COLOR YELLOW      SPECIFIC GRAVITY, URINE 1.014  1.005 - 1.030    GLUCOSE NEGATIVE  NEGATIVE mg/dL    BILIRUBIN NEGATIVE  NEGATIVE    KETONES NEGATIVE  NEGATIVE mg/dL    BLOOD SMALL (*) NEGATIVE    PH URINE 7.0  5.0 - 8.0    PROTEIN 30  (*) NEGATIVE mg/dL    UROBILINOGEN NORMAL  0.2 mg/dL    NITRITE POSITIVE (*) NEGATIVE    LEUKOCYTES LARGE (*) NEGATIVE   URINALYSIS, MICROSCOPIC       Result Value Range    RBC'S   (*) 0 - 4 /HPF    Value: 23  MICROSCOPIC ANALYSIS PERFORMED BY AUTOMATED METHOD    WBC'S >182 (*) 0 - 6 /HPF    BACTERIA MANY (*) OCL^OCCASIONAL OR LESS    WHITE BLOOD CELL CLUMP MANY      MUCOUS LIGHT     CREATININE URINE, RANDOM       Result Value Range    CREATININE, UR RAND 40 (*) NO REFERENCE RANGES ESTABLISHED mg/dL   SODIUM, RANDOM URINE       Result Value Range    SODIUM, URINE RAND 80 (*) NO REFERENCE RANGES ESTABLISHED mmol/L   UREA NITROGEN, RANDOM URINE       Result Value Range    UREA NITROGEN, RANDOM 269 (*) NO REFERENCE RANGES ESTABLISHED mg/dL   POCT WHOLE BLOOD GLUCOSE  Result Value Range    GLUCOSE, POINT OF CARE 190 (*) 70 - 105 mg/dL         Assessment/Plan:   Rickell Wiehe is a 73 y.o., White female with history of CHF, HTN, DM, seizures, TCC s/p left nephrectomy, cystectomy, hysterectomy, and ?possible lung cancer s/p LLL resection who was transferred from [facility] for [facility].    BRBPR, Abdominal Pain  - Two-day history of presumed BRBPR; no BM with blood. Hemoccult negative currently and pad shows only small amount of dried blood.  - CT abd/pelvis at outside facility shows RLQ abdominal wall mass  - No leukocytosis. Hgb 11.1; stable. CRP 2.84.  - Nausea control with phenergan and reglan (chronic; home regimen)  - Pain control with tylenol and morphine  - Fluids at 50cc/h  - NPO  - GI consult in the AM    Right Abdominal Wall Mass  - Mass noted on CT scan.   - Patient with mild RLQ pain  - Concerning for malignancy in setting of known hx of TCC and possible other cancers  - Consider IR biopsy    Acute Kidney Injury in the Setting of Hx of Transitional Cell Cancer s/p Cystectomy and L Nephrectomy with Nephrostomy in Place  - Creatinine 1.8 at outside facility. Now 1.55  - Holding lasix  - Start fluids at  50cc/h (hx of CHF)  - FeNa labs ordered    Complicated UTI s/p nephrostomy and left nephrectomy  - UA positive for leuks, nitrites  - Patient has nephrostomy and only right kidney.   - Rocephin started. Rec'd 1x at outside facility.    HTN  - Continue clonidine transdermal 0.1mg  weekly every Sunday  - Starting Norvasc 5 mg daily    Diabetes  - Continue Lantus at 30U BID  - SSI Protocol     Hypothyroidism  - Continue synthroid    CHF  - Previous LVEF unknown. Secondary to leaky valves, per patient/family.  - Will be conservative with fluids  - Holding lasix for AKI    DVT/PE Prophylaxis: SCDs/ Venodynes    Carney Bern, MD 11/19/2013, 03:53  PGY-1, Internal Medicine  Pager# [phone]      I saw and examined the patient.  I reviewed the resident's note.  I agree with the findings and plan of care as documented in the resident's note.  Any exceptions/additions are edited/noted.    Julious Payer, MD 11/19/2013, 08:31

## 2013-11-19 NOTE — Care Management Notes (Signed)
Care Coordinator/Social Work Plan  [place]  Patient Name: Christy Hale   MRN: [id number]   [place] Number: [id number]  DOB: Dec 26, 1940 Age: 73  **Admission Information**  Patient Type: INPATIENT  Admit Date: 11/19/2013 Admit Time: 02:27  Admit Reason: GI bleed  Admitting Phys: [place]  Attending Phys: Oval  Unit: CSDW Bed: 1007-A  160. LOC Notification, CMS Important Message / Detailed Notice  Created by : [employer] Date/Time 2013-11-19 10:38:52.000  Medicare Important Message/Detailed Notice  Admission- CMS-Important Message from Medicare About Your Rights (CMS-1405) IMM was delivered to the patient. A copy was  provided to the patient. (Date / Time) pt 11/19/2013

## 2013-11-19 NOTE — Care Plan (Signed)
Problem: General Plan of Care(Adult,OB)  Goal: Plan of Care Review(Adult,OB)  The patient and/or their representative will communicate an understanding of their plan of care   Outcome: Ongoing (see interventions/notes)  No acute episode. Pt is NPO. Pt has abd pain at 3-4.  And heme test was negative.

## 2013-11-20 ENCOUNTER — Encounter (HOSPITAL_COMMUNITY): Payer: Self-pay

## 2013-11-20 DIAGNOSIS — I10 Essential (primary) hypertension: Secondary | ICD-10-CM | POA: Diagnosis present

## 2013-11-20 DIAGNOSIS — N390 Urinary tract infection, site not specified: Secondary | ICD-10-CM | POA: Diagnosis present

## 2013-11-20 DIAGNOSIS — R1900 Intra-abdominal and pelvic swelling, mass and lump, unspecified site: Secondary | ICD-10-CM | POA: Diagnosis present

## 2013-11-20 DIAGNOSIS — K625 Hemorrhage of anus and rectum: Secondary | ICD-10-CM

## 2013-11-20 LAB — CBC/DIFF
BASOPHILS: 1 %
BASOPHILS: 1 %
BASOS ABS: 0.066 10*3/uL (ref 0.000–0.200)
EOS ABS: 0.4 THOU/uL (ref 0.000–0.500)
EOSINOPHIL: 4 %
HCT: 33.8 % (ref 33.5–45.2)
HGB: 11.3 g/dL (ref 11.2–15.2)
LYMPHOCYTES: 31 %
LYMPHS ABS: 2.87 THOU/uL (ref 1.000–4.800)
LYMPHS ABS: 2.87 THOU/uL (ref 1.000–4.800)
MCH: 28.5 pg (ref 27.4–33.0)
MCHC: 33.5 g/dL (ref 32.5–35.8)
MCV: 85.1 fL (ref 78–100)
MONOCYTES: 11 %
MONOS ABS: 1.002 THOU/uL — ABNORMAL HIGH (ref 0.300–1.000)
MPV: 8.1 fL (ref 7.5–11.5)
PLATELET COUNT: 273 THOU/uL (ref 140–450)
PMN ABS: 5.045 THOU/uL (ref 1.500–7.700)
PMN'S: 53 %
RBC: 3.97 MIL/uL (ref 3.63–4.92)
RDW: 14.3 % (ref 12.0–15.0)
WBC: 9.4 10*3/uL (ref 3.5–11.0)

## 2013-11-20 LAB — BASIC METABOLIC PANEL
ANION GAP: 10 mmol/L (ref 4–13)
ANION GAP: 10 mmol/L (ref 4–13)
BUN/CREAT RATIO: 15 (ref 6–22)
BUN/CREAT RATIO: 15 (ref 6–22)
BUN: 19 mg/dL (ref 8–25)
CALCIUM: 8.9 mg/dL (ref 8.5–10.4)
CARBON DIOXIDE: 19 mmol/L — ABNORMAL LOW (ref 22–32)
CHLORIDE: 112 mmol/L — ABNORMAL HIGH (ref 96–111)
CREATININE: 1.23 mg/dL — ABNORMAL HIGH (ref 0.49–1.10)
ESTIMATED GLOMERULAR FILTRATION RATE: 45 mL/min/{1.73_m2} — ABNORMAL LOW (ref >59)
GLUCOSE,NONFAST: 131 mg/dL (ref 65–139)
GLUCOSE,NONFAST: 131 mg/dL (ref 65–139)
POTASSIUM: 4.1 mmol/L (ref 3.5–5.1)
SODIUM: 141 mmol/L (ref 136–145)
SODIUM: 141 mmol/L (ref 136–145)

## 2013-11-20 LAB — PHOSPHORUS: PHOSPHORUS: 3 mg/dL (ref 2.3–4.0)

## 2013-11-20 LAB — PERFORM POC WHOLE BLOOD GLUCOSE
GLUCOSE, POINT OF CARE: 132 mg/dL — ABNORMAL HIGH (ref 70–105)
GLUCOSE, POINT OF CARE: 203 mg/dL — ABNORMAL HIGH (ref 70–105)
GLUCOSE, POINT OF CARE: 199 mg/dL — ABNORMAL HIGH (ref 70–105)

## 2013-11-20 LAB — MAGNESIUM: MAGNESIUM: 1.4 mg/dL — ABNORMAL LOW (ref 1.6–2.5)

## 2013-11-20 MED ORDER — PEG 3350-ELECTROLYTES 236 GRAM-22.74 GRAM-6.74 GRAM-5.86 GRAM SOLUTION
4.0000 L | Freq: Once | ORAL | Status: AC
Start: 2013-11-21 — End: 2013-11-21
  Administered 2013-11-21: 4000 mL via ORAL
  Filled 2013-11-20: qty 4000

## 2013-11-20 MED ADMIN — ONDANSETRON/ DEXAMETHASONE IVPB: INTRAVENOUS | @ 09:00:00

## 2013-11-20 MED ADMIN — POTASSIUM CHLORIDE 10 MEQ WITH LIDOCAINE 1% IN NS 100ML IVPB: INTRAVENOUS | @ 08:00:00

## 2013-11-20 NOTE — Care Plan (Signed)
Problem: General Plan of Care(Adult,OB)  Goal: Plan of Care Review(Adult,OB)  The patient and/or their representative will communicate an understanding of their plan of care   Outcome: Ongoing (see interventions/notes)  Patient has rested quietly through the night. No complaints of pain noted. MIVF continued. Patient maintained on a clear liquid diet. VSS.

## 2013-11-20 NOTE — Progress Notes (Addendum)
[facility]  Medicine Progress Note    Christy Hale  Date of service: 11/20/2013  Date of Admission:  11/19/2013    Hospital Day:  LOS: 1 day   Hospital Summary: 73 yo female w/ PMH of CHF, HTN, DM, seizures, transitional cell carcinoma s/p L. Nephrectomy, cystectomy, hysterectomy and LLL resection who was transferred from [facility] for [facility]. Patient had been having constipation x2 weeks and has been taking laxatives. Roommate noticed blood on nightgown and BRB when wiping. CT Abd/pelvis at outside facility showed right renal collecting system and ureter distension, as well as a right abdominal wall mass and RLL nodule. Hemoccult positive. Received Ceftriaxone x1. CC is RLQ pain radiating to back.   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   Subjective: [name] feels much better today. She denies any acute events. She had a BM yesterday evening which she describes as "muddy" with no overt blood. Her abdominal pain has improved but she continues to have some right flank tenderness. She denies fever, chills, nausea, or vomiting.     Vital Signs:  Temp  Avg: 36.7 C (98 F)  Min: 36.5 C (97.7 F)  Max: 36.8 C (98.3 F)    Pulse  Avg: 72.2  Min: 67  Max: 77 BP  Min: 161/56  Max: 175/80   Resp  Avg: 18.3  Min: 18  Max: 20 SpO2  Avg: 96.3 %  Min: 94 %  Max: 98 %   Pain Score (Numeric, Faces): 0      Input/Output    Intake/Output Summary (Last 24 hours) at 11/20/13 0642  Last data filed at 11/20/13 0600   Gross per 24 hour   Intake   1300 ml   Output   2600 ml   Net  -1300 ml    I/O last shift:  02/01 0000 - 02/01 0759  In: 450 [P.O.:200; I.V.:250]  Out: 650 [Urine:650]     Current Facility-Administered Medications:  acetaminophen (TYLENOL) tablet 650 mg Oral Q4H PRN   amLODIPine (NORVASC) tablet 5 mg Oral Daily   cefTRIAXone (ROCEPHIN) 1 g in iso-osmotic 50 mL premix IVPB 1 g Intravenous  Q24H   citalopram (CELEXA) tablet 40 mg Oral Daily   cloNIDine (CATAPRES-TTS) transdermal patch (mg/24 hr) 0.1 mg Transdermal Q7 Days   insulin glargine (LANTUS) 100 units/mL injection 30 Units Subcutaneous 2x/day   levothyroxine (SYNTHROID) tablet 25 mcg Oral QAM   metoclopramide (REGLAN) tablet 5 mg Oral 3x/day AC   morphine 2 mg/mL injection 2 mg Intravenous Q4H PRN   NS flush syringe 2 mL Intracatheter Q8HRS   And      NS flush syringe 2-6 mL Intracatheter Q1 MIN PRN   NS premix infusion  Intravenous Continuous   pantoprazole (PROTONIX) delayed release tablet 20 mg Oral 2x/day AC   polyethylene glycol (MIRALAX) oral packet 17 g Oral Daily PRN   promethazine (PHENERGAN) tablet 25 mg Oral 3x/day PRN   sennosides-docusate sodium (SENOKOT-S) 8.6-50mg  per tablet 1 Tab Oral 2x/day   simvastatin (ZOCOR) tablet 40 mg Oral QPM   SSIP insulin lispro (HUMALOG) 100 units/mL injection 3-9 Units Subcutaneous 4x/day PRN   traMADol (ULTRAM) tablet 50 mg Oral 3x/day PRN       Physical Exam:  General: acutely ill and no distress  Eyes: Conjunctiva clear.  HENT:Head atraumatic and normocephalic, Mouth mucous membranes moist.   Neck: no adenopathy  Lungs: Clear  to auscultation bilaterally.   Cardiovascular: regular rate and rhythm, S1, S2 normal, no murmur, click, rub or gallop  Abdomen: Bowel sounds normal, non-distended, soft, tender on right flank. No palpable mass  Extremities: No cyanosis or edema  Skin: Skin warm and dry  Neurologic: Grossly normal, CN II - XII grossly intact   Psychiatric: Affect Normal    Labs:      CBC:  Recent Labs      11/19/13   0445  11/20/13   0402   WBC  10.0  9.4   HGB  11.1*  11.3   HCT  32.7*  33.8   PLTCNT  294  273     Differential:   Recent Labs      11/19/13   0445  11/20/13   0402   PMNS  59  53   LYMPHOCYTES  26  31   MONOCYTES  10  11   EOSINOPHIL  4  4   BASOPHILS  1  1   PMNABS  5.849  5.045   LYMPHSABS  2.611  2.870   MONOSABS  1.013*  1.002*   EOSABS  0.382  0.400   BASOSABS  0.104   0.066     BMP:  Recent Labs      11/19/13   0445  11/20/13   0402   SODIUM  140  141   POTASSIUM  4.3  4.1   CHLORIDE  110  112*   CO2  19*  19*   BUN  33*  19   CREATININE  1.55*  1.23*   GLUCOSENF  202*  131   ANIONGAP  11  10   GFR  35*  45*     Recent Labs      11/19/13   0445  11/20/13   0402   CALCIUM  9.2  8.9   MAGNESIUM  1.4*  1.4*   PHOSPHORUS  3.8  3.0     RBC Indices:  Recent Labs      11/19/13   0445  11/20/13   0402   MCV  83.8  85.1   MCH  28.3  28.5   MCHC  33.8  33.5   RDW  14.4  14.3   MPV  8.3  8.1     Coagulation Studies:  Recent Labs      11/19/13   0445   INR  0.97   PROTHROMTME  10.6   APTT  32.2     Liver/Pancreas:  Recent Labs      11/19/13   0445   TOTALPROTEIN  7.4   ALBUMIN  2.7*   AST  13   ALT  16   ALKPHOS  133     Radiology:    Results for orders placed during the hospital encounter of 11/19/13 (from the past 72 hour(s))   XR AP MOBILE CHEST     Status: Normal    Narrative:     Christy Hale  XR AP MOBILE CHEST performed on Nov 19, 2013  5:55 AM.    CLINICAL HISTORY: 73 y.o. female with RLL Nodule seen on abdominal CT at   outside hospital.    Portable AP view of the chest is obtained with comparison to a chest   radiograph of January 03, 2011.    This examination shows heart size is at the upper range of normal.  Void   vasculature is unremarkable.  A left subclavian line and port are  present.    I do not see any focal infiltrates, pleural effusions, nor focal nodules   within the chest, with attention to the right lower lung.  As this was   detected on an outside CT study, a further evaluation with a CT study of   the chest should be considered.  Old fracture of the left sixth rib is   noted        Impression:      No focal nodules nor infiltrates are identified.  For further   evaluation of possible pulmonary nodules, CT of the chest would be   suggested.         PT/OT: No    Consults: GI     Hardware (lines, foley's, tubes): Implantable port on left, PIV L. Cephalic, Urostomy on  right     Assessment/ Plan:   Active Hospital Problems    Diagnosis    Primary Problem: BRBPR (bright red blood per rectum)    Mass in the abdomen    UTI (urinary tract infection)    HTN (hypertension)    AKI (acute kidney injury)    Type 2 diabetes mellitus     Summary: 73 yo female w/PMH of CHF, HTN, DM, seizures, TCC s/p left nephrectomy, cystectomy, hysterectomy, and lung cancer s/p LLL resection transferred from outside facility for BRBPR and abdominal mass on CT.    BRBPR, Abdominal pain  - Hemoccult negative at [facility]; CT abdomen/pelvis at outside facility demonstrating RLQ abdominal wall mass  - Hgb 11.3, Hct 33.8 stable from yesterday, and 1/4   - Monitoring H/H daily   - GI consulted; appreciate recommendations   - Type and hold 2 units PRBCs   - Obtain EGD and colonoscopy reports from [facility]   - Planning for colonoscopy on 11/22/13   - Start GoLytely on 11/21/13, NPO that day as well   - Order stool cultures   - Continuing Protonix 20 mg bid     R. 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  - Stop fluids today     UTI  - UA on presentation with positive nitrite, large leuks, 182 WBC  - Nephrostomy culture showing gram+ cocci in pairs, gram- rods  - Ceftriaxone 1 G Q24H (End date: 2/4 for 5 days therapy)   - Will switch based on sensitivities    HTN  - Continuing Clonidine transdermal patch 0.1 mg changed every Sunday  - Started Norvasc 5 mg daily     DM I  - Basal dosing of Lantus 30 units bid  - SSI protocol     Hypothyroidism  - Continuing synthroid 25 mcg qam     CHF  - Conservative fluid management  - Holding Lasix at time for AKI     Depression  - Celexa 40 mg daily     HLD  - Zocor 40 mg qpm     DVT/PE Prophylaxis: Not  indicated    Disposition Planning: Home discharge      Masih Janace Aris, MD 11/20/2013, 06:42      I saw and examined the patient.  I reviewed the resident's note.  I agree with the findings and  plan of care as documented in the resident's note.  Any exceptions/additions are edited/noted.    Loistine Simas, MD 11/20/2013, 19:42

## 2013-11-20 NOTE — Pharmacy (Addendum)
Discharge Pharmacy Service  11/20/2013    Kasiah, Manka  [birth year]  807/A/MEDICINE 2    Date of service: 11/20/2013    Allergies   Allergen Reactions    Glucophage [Metformin]          Met with patient to :Initiate Discharge Pharmacy Services:  Accept  Freedom of Choice Offerings: Accept  Review of Patient's Medication List: [place], PHARM STUDEN 11/20/2013, 15:19

## 2013-11-20 NOTE — Nurses Notes (Signed)
Received patient from 10W. Assessment per flowsheet. Vital signs stable. Patient having no pain at present time. Will continue to monitor.

## 2013-11-20 NOTE — Care Plan (Signed)
Problem: General Plan of Care(Adult,OB)  Goal: Plan of Care Review(Adult,OB)  The patient and/or their representative will communicate an understanding of their plan of care   Outcome: Ongoing (see interventions/notes)  Patient has consult for GI, possible scope 2/2. VSS, patient made floor status today and being transferred to 807. Family called and left a message on daughters cell phone.

## 2013-11-21 DIAGNOSIS — R9431 Abnormal electrocardiogram [ECG] [EKG]: Secondary | ICD-10-CM

## 2013-11-21 LAB — CBC/DIFF
BASOPHILS: 1 %
BASOS ABS: 0.074 10*3/uL (ref 0.000–0.200)
EOS ABS: 0.348 THOU/uL (ref 0.000–0.500)
EOSINOPHIL: 4 %
HCT: 34.1 % (ref 33.5–45.2)
HGB: 11.4 g/dL (ref 11.2–15.2)
LYMPHOCYTES: 25 %
LYMPHS ABS: 2.522 THOU/uL (ref 1.000–4.800)
MCH: 28.1 pg (ref 27.4–33.0)
MCHC: 33.3 g/dL (ref 32.5–35.8)
MCV: 84.5 fL (ref 78–100)
MCV: 84.5 fL (ref 78–100)
MONOCYTES: 10 %
MONOS ABS: 0.981 THOU/uL (ref 0.300–1.000)
MPV: 8 fL (ref 7.5–11.5)
PLATELET COUNT: 277 10*3/uL (ref 140–450)
PLATELET COUNT: 277 THOU/uL (ref 140–450)
PMN ABS: 6.009 THOU/uL (ref 1.500–7.700)
PMN'S: 60 %
RBC: 4.04 MIL/uL (ref 3.63–4.92)
RDW: 14 % (ref 12.0–15.0)
WBC: 9.9 10*3/uL (ref 3.5–11.0)
WBC: 9.9 10*3/uL (ref 3.5–11.0)

## 2013-11-21 LAB — BASIC METABOLIC PANEL
ANION GAP: 11 mmol/L (ref 4–13)
BUN/CREAT RATIO: 13 (ref 6–22)
BUN: 16 mg/dL (ref 8–25)
CALCIUM: 9.6 mg/dL (ref 8.5–10.4)
CARBON DIOXIDE: 21 mmol/L — ABNORMAL LOW (ref 22–32)
CHLORIDE: 108 mmol/L (ref 96–111)
CREATININE: 1.25 mg/dL — ABNORMAL HIGH (ref 0.49–1.10)
ESTIMATED GLOMERULAR FILTRATION RATE: 45 mL/min/{1.73_m2} — ABNORMAL LOW (ref >59)
GLUCOSE,NONFAST: 136 mg/dL (ref 65–139)
POTASSIUM: 4 mmol/L (ref 3.5–5.1)
SODIUM: 140 mmol/L (ref 136–145)
SODIUM: 140 mmol/L (ref 136–145)

## 2013-11-21 LAB — PERFORM POC WHOLE BLOOD GLUCOSE
GLUCOSE, POINT OF CARE: 140 mg/dL — ABNORMAL HIGH (ref 70–105)
GLUCOSE, POINT OF CARE: 303 mg/dL — ABNORMAL HIGH (ref 70–105)
GLUCOSE, POINT OF CARE: 266 mg/dL — ABNORMAL HIGH (ref 70–105)

## 2013-11-21 LAB — PT/INR
INR: 1.05 (ref 0.80–1.20)
PROTHROMBIN TIME: 11.4 s (ref 8.7–13.1)

## 2013-11-21 LAB — HGA1C (HEMOGLOBIN A1C WITH EST AVG GLUCOSE)
ESTIMATED AVERAGE GLUCOSE: 192 mg/dL
HEMOGLOBIN A1C: 8.3 % — ABNORMAL HIGH (ref 4.0–6.0)

## 2013-11-21 LAB — PTT (PARTIAL THROMBOPLASTIN TIME): APTT: 33 s (ref 25.1–36.5)

## 2013-11-21 MED ORDER — AMLODIPINE 10 MG TABLET
10.0000 mg | ORAL_TABLET | Freq: Every day | ORAL | Status: DC
Start: 2013-11-22 — End: 2013-11-24
  Administered 2013-11-22 – 2013-11-24 (×3): 10 mg via ORAL
  Filled 2013-11-21 (×3): qty 1

## 2013-11-21 MED ORDER — SULFAMETHOXAZOLE 800 MG-TRIMETHOPRIM 160 MG TABLET
1.0000 | ORAL_TABLET | Freq: Two times a day (BID) | ORAL | Status: DC
Start: 2013-11-21 — End: 2013-11-24
  Administered 2013-11-21 – 2013-11-24 (×7): 160 mg via ORAL
  Filled 2013-11-21 (×8): qty 1

## 2013-11-21 MED ORDER — SODIUM CHLORIDE 0.9 % INTRAVENOUS SOLUTION
INTRAVENOUS | Status: DC
Start: 2013-11-21 — End: 2013-11-24

## 2013-11-21 MED ORDER — ONDANSETRON HCL (PF) 4 MG/2 ML INJECTION SOLUTION
4.00 mg | Freq: Three times a day (TID) | INTRAMUSCULAR | Status: DC | PRN
Start: 2013-11-21 — End: 2013-11-24
  Administered 2013-11-22 (×2): 4 mg via INTRAVENOUS
  Filled 2013-11-21 (×2): qty 2

## 2013-11-21 MED ORDER — PROCHLORPERAZINE EDISYLATE 10 MG/2 ML (5 MG/ML) INJECTION SOLUTION
10.0000 mg | INTRAMUSCULAR | Status: AC
Start: 2013-11-21 — End: 2013-11-21
  Administered 2013-11-21: 10 mg via INTRAVENOUS
  Filled 2013-11-21: qty 2

## 2013-11-21 MED ADMIN — pantoprazole 20 mg tablet,delayed release: ORAL | @ 17:00:00

## 2013-11-21 NOTE — Care Plan (Signed)
Problem: General Plan of Care(Adult,OB)  Goal: Plan of Care Review(Adult,OB)  The patient and/or their representative will communicate an understanding of their plan of care   Outcome: Ongoing (see interventions/notes)  Patient rested throughout shift without complaint.  Nurse and CA rounding performed to ensure patient needs are being met. Blood glucose monitored.  Plan to start Golytely tonight and have scope done on 2/3.

## 2013-11-21 NOTE — Anesthesia Preprocedure Evaluation (Addendum)
Physical Exam:     Airway       Mallampati: II    TM distance: >3 FB    Neck ROM: full  Mouth Opening: good.            Dental           (+) edentulous           Pulmonary    Comment: Clear at apex bilateral, difficult to auscultate at bases         Cardiovascular    Rhythm: regular  Rate: Normal       Other findings            Anesthesia Plan:  Planned anesthesia type: general and MAC  ASA 3     Intravenous induction   Patient's NPO status is appropriate for Anesthesia.    Anesthetic plan and risks discussed with patient.    Anesthesia issues/risks discussed are: Stroke, Cardiac Events/MI, Aspiration, Blood Loss, Difficult Airway and Post-op Pain Management.        Plan discussed with CRNA.                    Reports able to walk one flight of stairs, prior to hospitalization  History of bladder tumor and kidney resection 3 years ago with no difficulty  Abdominal mass on CT    Denies reflux  Current mild AKI improving    Occasional shortness of breath, denies chest pain  EKG ordered and pending    CXR 11/18/13  No focal nodules nor infiltrates are identified.    Courtney Heys, MD 11/22/2013, 09:52

## 2013-11-21 NOTE — Nurses Notes (Signed)
Patient received Compazine for nausea/vomiting. Vitals will be monitored every 15 minutes x 2.

## 2013-11-21 NOTE — Care Plan (Signed)
Problem: General Plan of Care(Adult,OB)  Goal: Plan of Care Review(Adult,OB)  The patient and/or their representative will communicate an understanding of their plan of care   Outcome: Ongoing (see interventions/notes)  Patient to have golytely prep for colonoscopy tomorrow; noted bright red bleeding per rectum when patient stood to walk to the bathroom.  Denies any pain or distress at this time; will monitor.

## 2013-11-21 NOTE — Care Plan (Signed)
Problem: General Plan of Care(Adult,OB)  Goal: Plan of Care Review(Adult,OB)  The patient and/or their representative will communicate an understanding of their plan of care   Outcome: Ongoing (see interventions/notes)  pt comes in with GI bleed.  Has abdominal mass.  GI consulted.  Is NPO.  Receiving pain control.  For colonoscopy tomorrow.  Met with pt today.  She is a/ox3.  Lives at home with a friend.  Says she is independent at home and able to meet her own care needs.  Has DME at home including, RW, cane, and wheelchair.  Pt will return to home and does not feel she will require any assist with d/c planning.  Will follow.

## 2013-11-21 NOTE — Care Management Notes (Signed)
[facility] Management Initial Evaluation    Patient Name: Christy Hale  Date of Birth: 1941/06/16  Sex: female  Date/Time of Admission: 11/19/2013  2:27 AM  Room/Bed: 807/A  Payor: HU[employer] MEDICARE / Plan: Mcarthur Rossetti CHOICE PPO / Product Type: PPO /   PCP: Timmie Foerster, DO    Pharmacy Info:   Preferred Pharmacy    [facility] [address], [place]    [place] [address]    Phone: [phone] Fax: [phone]    Open 24 Hours?: No        Emergency Contact Info:   Extended Emergency Contact Information  Primary Emergency Contact: [facility]  Address: [address], [place] [address] [place] Phone: [phone]  Work Phone: [phone]  Mobile Phone: [phone]  Relation: Daughter  Secondary Emergency Contact: GOFF,Janaiah  Address: [address]           [place], [place] [address] [place] Phone: [phone]  Work Phone: [phone]  Mobile Phone: [phone]  Relation: Daughter    History:   Christy Hale is a 73 y.o., female, admitted with GI bleed.    Height/Weight: 157.5 cm (_0 ) / 96.4 kg (212 lb 8.4 oz)     LOS: 2 days   Admitting Diagnosis: GI bleed  Rectal bleeding    Assessment:    11/21/13 1143   Assessment Details   Assessment Type Admission   Date of Care Management Update 11/21/13   Date of Next DCP Update 11/24/13   Care Management Plan   Discharge Planning Status initial meeting   Projected Discharge Date 11/25/13   CM will evaluate for rehabilitation potential yes   Patient choice offered to patient/family no   Form for patient choice reviewed/signed and on chart no   Patient aware of possible cost for ambulance transport?  No   Discharge Needs Assessment   Outpatient/Agency/Support Group Needs none   Equipment Currently Used at Home cane, straight;walker, rolling;wheelchair   Equipment Needed After Discharge none   Discharge  Facility/Level of Care Needs Home (Patient/Family Member/other)(code 1)   Transportation Available car;family or friend will provide   Referral Information   Admission Type inpatient   Address Verified verified-no changes   Arrived From acute care facility   Insurance Verified verified-no change   ADVANCE DIRECTIVES   !! Does the Patient have an Advance Directive? Yes, Patient Does Have Advance Directive for Healthcare Treatment   Type of Advance Directive Completed Medical Living Will;Medical Power of Attorney   Copy of Advance Directives in Chart? 0   Employment/Financial   Patient has Prescription Coverage?  Yes   Living Environment   Lives With friend(s)   Living Arrangements house   Able to Return to Prior Living Arrangements yes   Home Safety   Home Assessment: No Problems Identified   Home Accessibility no concerns   Per TBR, pt comes in with GI bleed.  Has abdominal mass.  GI consulted.  Is NPO.  Receiving pain control.  For colonoscopy tomorrow.  Met with pt today.  She is a/ox3.  Lives at home with a friend.  Says she is independent at home and able to meet her own care needs.  Has DME at home including, RW, cane, and wheelchair.  Pt will return to home and does not feel she will  require any assist with d/c planning.  Will follow.    Discharge Plan:  Home(Patient/Family Member/other) (code 1)      The patient will continue to be evaluated for developing discharge needs.     Case Manager: [place], SW 11/21/2013, 11:45  Phone: [phone]

## 2013-11-21 NOTE — Progress Notes (Addendum)
[facility]  Medicine Progress Note    Christy Hale  Date of service: 11/21/2013  Date of Admission:  11/19/2013    Hospital Day:  LOS: 2 days   Hospital Summary: 74 yo female w/ PMH of CHF, HTN, DM, seizures, transitional cell carcinoma s/p L. Nephrectomy, cystectomy, hysterectomy and LLL resection who was transferred from [facility] for [facility]. Patient had been having constipation x2 weeks and has been taking laxatives. Roommate noticed blood on nightgown and BRB when wiping. CT Abd/pelvis at outside facility showed right renal collecting system and ureter distension, as well as a right abdominal wall mass and RLL nodule. Hemoccult positive. Received Ceftriaxone x1. CC is RLQ pain radiating to back.   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   Subjective:  - Christy Hale ambulated up out of bed to the chair today. She reports decreased pain relative to yesterday.  - She endorsed nausea last evening, controlled with phenergan  - Reports 1 small BM, brown in color with no blood.   - Required 18 units of SSI insulin yesterday.   - Has a pin-rolling tremor noticed today. States it comes and goes periodically. No history of family members with similar tremor.     Vital Signs:  Temp  Avg: 36.9 C (98.4 F)  Min: 36.6 C (97.9 F)  Max: 37.1 C (98.8 F)    Pulse  Avg: 70.3  Min: 66  Max: 72 BP  Min: 154/69  Max: 193/83   Resp  Avg: 17.7  Min: 16  Max: 18 SpO2  Avg: 95.7 %  Min: 95 %  Max: 96 %   Pain Score (Numeric, Faces): 0      Input/Output    Intake/Output Summary (Last 24 hours) at 11/21/13 0723  Last data filed at 11/21/13 0606   Gross per 24 hour   Intake   1120 ml   Output   2100 ml   Net   -980 ml    I/O last shift:  02/02 0000 - 02/02 0759  In: 0   Out: 1600 [Urine:1600]       Current Facility-Administered Medications:  acetaminophen (TYLENOL) tablet 650 mg Oral Q4H PRN   amLODIPine  (NORVASC) tablet 5 mg Oral Daily   cefTRIAXone (ROCEPHIN) 1 g in iso-osmotic 50 mL premix IVPB 1 g Intravenous Q24H   citalopram (CELEXA) tablet 40 mg Oral Daily   cloNIDine (CATAPRES-TTS) transdermal patch (mg/24 hr) 0.1 mg Transdermal Q7 Days   insulin glargine (LANTUS) 100 units/mL injection 30 Units Subcutaneous 2x/day   levothyroxine (SYNTHROID) tablet 25 mcg Oral QAM   metoclopramide (REGLAN) tablet 5 mg Oral 3x/day AC   morphine 2 mg/mL injection 2 mg Intravenous Q4H PRN   NS flush syringe 2 mL Intracatheter Q8HRS   And      NS flush syringe 2-6 mL Intracatheter Q1 MIN PRN   pantoprazole (PROTONIX) delayed release tablet 20 mg Oral 2x/day AC   polyethylene glycol (MIRALAX) oral packet 17 g Oral Daily PRN   polyethylene glycol electrolyte (goLYTELY) oral liquid 4 L Oral Once   promethazine (PHENERGAN) tablet 25 mg Oral 3x/day PRN   sennosides-docusate sodium (SENOKOT-S) 8.6-50mg  per tablet 1 Tab Oral 2x/day   simvastatin (ZOCOR) tablet 40 mg Oral QPM   SSIP insulin lispro (HUMALOG) 100 units/mL injection 3-9 Units Subcutaneous 4x/day PRN   traMADol (ULTRAM) tablet 50 mg Oral 3x/day PRN  Physical Exam:  General: acutely ill and no distress. Clinically dehydrated appearing with skin tenting, also looks tired today.   Eyes: Conjunctiva clear.  HENT:Head atraumatic and normocephalic, Mouth mucous membranes dry  Neck: no adenopathy  Lungs: Clear to auscultation bilaterally.   Cardiovascular: regular rate and rhythm, S1, S2 normal, no murmur, click, rub or gallop  Abdomen: Bowel sounds normal, non-distended, soft, tender on right flank. No palpable mass  Extremities: No cyanosis or edema  Skin: Skin warm and dry  Neurologic: Grossly normal, pin-rolling tremor b/l, more pronounced in right hand. Indiscriminate cogwheel rigidity noted.   Psychiatric: Affect Normal    Labs:      CBC:  Recent Labs      11/19/13   0445  11/20/13   0402  11/21/13   0536   WBC  10.0  9.4  9.9   HGB  11.1*  11.3  11.4   HCT  32.7*   33.8  34.1   PLTCNT  294  273  277     Differential:   Recent Labs      11/19/13   0445  11/20/13   0402  11/21/13   0536   PMNS  59  53  60   LYMPHOCYTES  26  31  25    MONOCYTES  10  11  10    EOSINOPHIL  4  4  4    BASOPHILS  1  1  1    PMNABS  5.849  5.045  6.009   LYMPHSABS  2.611  2.870  2.522   MONOSABS  1.013*  1.002*  0.981   EOSABS  0.382  0.400  0.348   BASOSABS  0.104  0.066  0.074     BMP:  Recent Labs      11/19/13   0445  11/20/13   0402  11/21/13   0536   SODIUM  140  141  140   POTASSIUM  4.3  4.1  4.0   CHLORIDE  110  112*  108   CO2  19*  19*  21*   BUN  33*  19  16   CREATININE  1.55*  1.23*  1.25*   GLUCOSENF  202*  131  136   ANIONGAP  11  10  11    GFR  35*  45*  45*     Recent Labs      11/19/13   0445  11/20/13   0402  11/21/13   0536   CALCIUM  9.2  8.9  9.6   MAGNESIUM  1.4*  1.4*   --    PHOSPHORUS  3.8  3.0   --      RBC Indices:  Recent Labs      11/19/13   0445  11/20/13   0402  11/21/13   0536   MCV  83.8  85.1  84.5   MCH  28.3  28.5  28.1   MCHC  33.8  33.5  33.3   RDW  14.4  14.3  14.0   MPV  8.3  8.1  8.0     Coagulation Studies:  Recent Labs      11/19/13   0445   INR  0.97   PROTHROMTME  10.6   APTT  32.2     Liver/Pancreas:  Recent Labs      11/19/13   0445   TOTALPROTEIN  7.4   ALBUMIN  2.7*   AST  13   ALT  16  ZGYFVCB  449     Radiology:    Results for orders placed during the hospital encounter of 11/19/13 (from the past 72 hour(s))   XR AP MOBILE CHEST     Status: Normal    Narrative:     Christy Hale  XR AP MOBILE CHEST performed on Nov 19, 2013  5:55 AM.    CLINICAL HISTORY: 73 y.o. female with RLL Nodule seen on abdominal CT at   outside hospital.    Portable AP view of the chest is obtained with comparison to a chest   radiograph of January 03, 2011.    This examination shows heart size is at the upper range of normal.  Void   vasculature is unremarkable.  A left subclavian line and port are present.    I do not see any focal infiltrates, pleural effusions, nor focal  nodules   within the chest, with attention to the right lower lung.  As this was   detected on an outside CT study, a further evaluation with a CT study of   the chest should be considered.  Old fracture of the left sixth rib is   noted        Impression:      No focal nodules nor infiltrates are identified.  For further   evaluation of possible pulmonary nodules, CT of the chest would be   suggested.         PT/OT: No    Consults: GI     Hardware (lines, foley's, tubes): Implantable port on left, PIV L. Cephalic, Urostomy on right     Assessment/ Plan:   Active Hospital Problems    Diagnosis    Primary Problem: BRBPR (bright red blood per rectum)    Mass in the abdomen    UTI (urinary tract infection)    HTN (hypertension)    AKI (acute kidney injury)    Type 2 diabetes mellitus     Summary: 73 yo female w/PMH of CHF, HTN, DM, seizures, TCC s/p left nephrectomy, cystectomy, hysterectomy, and lung cancer s/p LLL resection transferred from outside facility for BRBPR and abdominal mass on CT.    BRBPR, Abdominal pain  - Hemoccult negative at [facility]; CT abdomen/pelvis at outside facility demonstrating RLQ abdominal wall mass. CT on Image Grid  - Hgb 11.4, Hct 34.1 have remained stable   - Monitoring H/H daily   - GI consulted; appreciate recommendations   - Type and hold 2 units PRBCs   - Obtain EGD and colonoscopy reports from [facility]   - Planning for colonoscopy on 11/22/13 at 1 pm   - Start GoLytely on 11/21/13 at 6 pm   - Clear liquids today    - NPO at midnight    - Consulted Anesthesia    - Order stool cultures   - Continuing Protonix 20 mg bid   - Started MIVF of NS 100 cc/hr due to clinical dehydration and in anticipation of bowel clean out.       R. Abdominal Wall mass  - Demonstrated on CT scan at outside facility   - History of transitional cell carcinoma, recurrence/new primaries   - Scheduled biopsy with IR; tentatively scheduled for tomorrow morning however, with colonoscopy will reschedule for Wednesday.          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   - Sensitive to Bactrim. Will switch to 1 double strength tablet bid   - UTI due to A.xylosoxidans rare, but usually found in patients with urological abnormalities and Bactrim shown to be most useful treatment in community acquired infections (Tena 2008, "Urinary tract infection due to Achromobacter xylosoxidans: report of 9 cases", [employer] Journal of Infectious Disease)    HTN  - Continuing Clonidine transdermal patch 0.1 mg changed every Sunday  - Increase Norvasc to 10 mg daily due to continuing elevated blood pressures    DM I  - Basal dosing of Lantus 30 units bid; Monitoring to see if basal rate needs increased   - SSI protocol     Tremor  - Pin-rolling tremor with cogwheel rigidity elicited on exam today.   - Patient has not had this evaluated before. Will scheduled outpatient Neurology follow up for evaluation.     Hypothyroidism  - Continuing synthroid 25 mcg qam     CHF  - Conservative fluid management  - Holding Lasix at time for AKI     Depression  - Celexa 40 mg daily     HLD  - Zocor 40 mg qpm     DVT/PE Prophylaxis: Not indicated    Disposition Planning: Home discharge      Masih Janace Aris, MD 11/21/2013, 07:23        I saw and examined the patient.  I reviewed the resident's note.  I agree with the findings and plan of care as documented in the resident's note.  Any exceptions/additions are edited/noted.    Loistine Simas, MD 11/21/2013, 19:57

## 2013-11-22 ENCOUNTER — Encounter (HOSPITAL_COMMUNITY): Payer: Self-pay | Admitting: ANESTHESIOLOGY

## 2013-11-22 ENCOUNTER — Ambulatory Visit (HOSPITAL_COMMUNITY): Payer: Self-pay | Admitting: Gastroenterology

## 2013-11-22 ENCOUNTER — Inpatient Hospital Stay (HOSPITAL_COMMUNITY): Payer: Commercial Managed Care - PPO | Admitting: ANESTHESIOLOGY

## 2013-11-22 ENCOUNTER — Encounter (HOSPITAL_COMMUNITY)
Admission: AD | Disposition: A | Discharge status: Home / Self Care | Payer: Self-pay | Source: Other Acute Inpatient Hospital | Attending: Specialist

## 2013-11-22 DIAGNOSIS — E119 Type 2 diabetes mellitus without complications: Secondary | ICD-10-CM

## 2013-11-22 DIAGNOSIS — E039 Hypothyroidism, unspecified: Secondary | ICD-10-CM

## 2013-11-22 DIAGNOSIS — K625 Hemorrhage of anus and rectum: Secondary | ICD-10-CM

## 2013-11-22 LAB — PERFORM POC WHOLE BLOOD GLUCOSE
GLUCOSE, POINT OF CARE: 109 mg/dL — ABNORMAL HIGH (ref 70–105)
GLUCOSE, POINT OF CARE: 84 mg/dL (ref 70–105)
GLUCOSE, POINT OF CARE: 126 mg/dL — ABNORMAL HIGH (ref 70–105)
GLUCOSE, POINT OF CARE: 134 mg/dL — ABNORMAL HIGH (ref 70–105)
GLUCOSE, POINT OF CARE: 134 mg/dL — ABNORMAL HIGH (ref 70–105)

## 2013-11-22 LAB — BASIC METABOLIC PANEL
ANION GAP: 12 mmol/L (ref 4–13)
BUN/CREAT RATIO: 11 (ref 6–22)
BUN: 15 mg/dL (ref 8–25)
CALCIUM: 9.2 mg/dL (ref 8.5–10.4)
CARBON DIOXIDE: 23 mmol/L (ref 22–32)
CHLORIDE: 105 mmol/L (ref 96–111)
CREATININE: 1.32 mg/dL — ABNORMAL HIGH (ref 0.49–1.10)
ESTIMATED GLOMERULAR FILTRATION RATE: 42 mL/min/{1.73_m2} — ABNORMAL LOW (ref >59)
GLUCOSE,NONFAST: 143 mg/dL (ref 65–139)
GLUCOSE,NONFAST: 143 mg/dL — ABNORMAL HIGH (ref 65–139)
POTASSIUM: 3.9 mmol/L (ref 3.5–5.1)
SODIUM: 140 mmol/L (ref 136–145)

## 2013-11-22 LAB — CBC/DIFF
BASOPHILS: 1 %
BASOS ABS: 0.122 10*3/uL (ref 0.000–0.200)
EOS ABS: 0.124 THOU/uL (ref 0.000–0.500)
EOSINOPHIL: 1 %
HCT: 32.1 % — ABNORMAL LOW (ref 33.5–45.2)
HGB: 10.9 g/dL — ABNORMAL LOW (ref 11.2–15.2)
LYMPHOCYTES: 23 %
LYMPHS ABS: 2.803 10*3/uL (ref 1.000–4.800)
MCH: 28.7 pg (ref 27.4–33.0)
MCHC: 34 g/dL (ref 32.5–35.8)
MCV: 84.2 fL (ref 78–100)
MONOCYTES: 7 %
MONOCYTES: 7 %
MONOS ABS: 0.87 THOU/uL (ref 0.300–1.000)
MPV: 8.1 fL (ref 7.5–11.5)
PLATELET COUNT: 285 THOU/uL (ref 140–450)
PMN ABS: 8.428 10*3/uL — ABNORMAL HIGH (ref 1.500–7.700)
PMN'S: 68 %
RBC: 3.82 MIL/uL (ref 3.63–4.92)
RDW: 14.1 % (ref 12.0–15.0)
WBC: 12.3 THOU/uL — ABNORMAL HIGH (ref 3.5–11.0)

## 2013-11-22 SURGERY — COLONOSCOPY
Anesthesia: General | Wound class: Clean Contaminated Wounds-The respiratory, GI, Genital, or urinary

## 2013-11-22 MED ORDER — EPHEDRINE SULFATE 50 MG/ML INJECTION SOLUTION
Freq: Once | INTRAMUSCULAR | Status: DC | PRN
Start: 2013-11-22 — End: 2013-11-22
  Administered 2013-11-22: 5 mg via INTRAVENOUS

## 2013-11-22 MED ORDER — PHENYLEPHRINE 0.5 MG/5 ML (100 MCG/ML)IN 0.9 % SOD.CHLORIDE IV SYRINGE
INJECTION | Freq: Once | INTRAVENOUS | Status: DC | PRN
Start: 2013-11-22 — End: 2013-11-22
  Administered 2013-11-22 (×3): 100 ug via INTRAVENOUS

## 2013-11-22 MED ORDER — LACTATED RINGERS INTRAVENOUS SOLUTION
INTRAVENOUS | Status: DC | PRN
Start: 2013-11-22 — End: 2013-11-22

## 2013-11-22 MED ORDER — PROPOFOL 10 MG/ML INTRAVENOUS EMULSION
INTRAVENOUS | Status: DC | PRN
Start: 2013-11-22 — End: 2013-11-22
  Administered 2013-11-22: 300 ug/kg/min via INTRAVENOUS
  Administered 2013-11-22: 150 ug/kg/min via INTRAVENOUS
  Administered 2013-11-22: 0 ug/kg/min via INTRAVENOUS
  Administered 2013-11-22: 500 ug/kg/min via INTRAVENOUS
  Administered 2013-11-22: 200 ug/kg/min via INTRAVENOUS

## 2013-11-22 MED ADMIN — metoclopramide 5 mg tablet: ORAL | @ 13:00:00

## 2013-11-22 MED ADMIN — metoclopramide 5 mg tablet: ORAL | @ 18:00:00

## 2013-11-22 MED ADMIN — phenylephrine 0.5 mg/5 mL (100 mcg/mL)in 0.9 % sod.chloride IV syringe: INTRAVENOUS | @ 11:00:00

## 2013-11-22 MED ADMIN — potassium chloride 20 mEq/L in dextrose 5 %-0.45 % sodium chloride IV: INTRAVENOUS | @ 11:00:00 | NDC 00338067104

## 2013-11-22 MED ADMIN — sodium chloride 0.9 % (flush) injection syringe: INTRAVENOUS | @ 11:00:00

## 2013-11-22 MED ADMIN — potassium chloride 20 mEq/L in 0.9 % sodium chloride intravenous: ORAL | @ 13:00:00

## 2013-11-22 SURGICAL SUPPLY — 69 items
CANNULA INJ 17GA NDLS SYRG BLUNT STRL LF  10ML BD INTRLNK PLASTIC BXTR INTLNK ABT LS MCGAW SAFELINE (IV TUBING & ACCESSORIES) ×1 IMPLANT
CANNULA NASAL 14FT ANGL FLXB LIP PLATE CRSH RS LUM TUBE NFLR TP ADULT ARLF UCIT STD CURVE LF  DISP (CANNULA) IMPLANT
CANNULA NASAL 14FT ANGL FLXB L_IP PLATE CRSH RS LUM TUBE NFLR (CANNULA)
CANNULA NASAL 7FT ANGL FLXB LIP PLATE CRSH RS LUM TUBE FLR TIP ADULT ARLF UCIT STD CURVE LF  DISP (CANNULA) IMPLANT
CANNULA NASAL 7FT ANGL FLXB LI_P PLATE CRSH RS LUM TUBE FLR (CANNULA)
CANNULA PLASTIC NEEDLELESS IV_303345 100/BX (IV TUBING & ACCESSORIES) ×1
CATH ELHMST GLD PRB 10FR 300CM_BIPO RND DIST TIP STD CONN (DIAGNOSTIC)
CATH ELHMST GLD PROBE 10FR 300CM BIPOLAR RND DIST TIP STD CONN FIRM SHAFT HMGLD STRL DISP 3.7MM MN (DIAGNOSTIC) IMPLANT
CATH ELHMST GLD PROBE 7FR 300CM BIPOLAR RND DIST TIP STD CONN FIRM SHAFT HMGLD STRL DISP 2.8MM MN (DIAGNOSTIC) IMPLANT
CATH ELHMST GLD PROBE 7FR 300C_M BIPOLAR RND DIST TIP STD (DIAGNOSTIC)
CLIP HMST RADOPQ PRELD STRL DISP RSL 235CM 2.8MM 11MM OPN (SURGICAL INSTRUMENTS) IMPLANT
CONV USE ITEM 343591 - SOLIDIFY FLUID 1500ML DSPNSR L_Q TX SOLIDIFY SFTP LTS+ DISP (STER) ×1 IMPLANT
DEVICE RESOLUTION CLIP 235CML_M00522611 11MMW 10EA/BX (INSTRUMENTS)
DEVICE SPEC RETR TLN 2.5MM 160CM 4 PRONG GRASPER INWRD HOOK SHEATH SS DISP (ENDOSCOPIC SUPPLIES) IMPLANT
DEVICE SPEC RETR TLN 2.5MM 160_CM 4 PRONG GRSP INWRD FACE (INSTRUMENTS ENDOMECHANICAL)
DILATOR ENDOS CRE 240CM 5.5CM 11-13.5-15MM 7.5FR ESOPH PYL BIL BAL LOW PROF GW PEBAX STRL LF  DISP (BALLOON) IMPLANT
DILATOR ENDOS CRE 240CM 5.5CM 15-16.5-18MM 7.5FR ESOPH PYL BIL BAL LOW PROF GW PEBAX STRL LF  DISP (BALLOON) IMPLANT
DILATOR ENDOS CRE 240CM 5.5CM_11-13.5-15MM 7.5FR ESOPH PYL (BALLOON)
DILATOR ENDOS CRE 240CM 5.5CM_15-16.5-18MM 7.5FR ESOPH PYL (BALLOON)
DISC USE ITEM 309153 - TRAP SPECI ARGYLE 40CC GRAD SC_REW ON CAP REM MALE CONN (Cautery Accessories)
DISCONTINUED NO SUB - JELLY LUB DYNALUBE BCTRST WATER SOL NGRS PKT STRL 5GM LF (WOUND CARE SUPPLY) ×2 IMPLANT
DISCONTINUED USE ITEM 309153 - TRAP SPECI ARGYLE 40CC GRAD SC_REW ON CAP REM MALE CONN (Cautery Accessories) IMPLANT
DISCONTINUED USE ITEM 339015 - CONTAINR STRL 10% NEUT BF FRMLN POLYPROP GRAD LEAK RST ORNG PREFL SCREW CAP FSHR HLTHCR PRTCL GRN (CHEM) IMPLANT
DISCONTINUED USE ITEM 82101 - TUBING OXYGEN 50/CS 001302 (TUBE/TUBING & SUCTION SUPPLIES) IMPLANT
ELECTRODE PATIENT RTN 9FT VLAB C30- LB RM PHSV ACRL FOAM CORD NONIRRITATE NONSENSITIZE ADH STRP (CAUTERY SUPPLIES) IMPLANT
ELECTRODE PATIENT RTN 9FT VLAB_REM C30- LB PLHSV ACRL FOAM (CAUTERY SUPPLIES)
FILTER PROBE SIDE FIRE 2.3MM 20132-217 INTEGRATED BX/10 (Dilators) IMPLANT
FORCEPS BIOPSY 240CM 2.4MM RJ_4 2.8MM LRG CPC DISP ORNG (GUIDING)
FORCEPS BIOPSY HOT 240CM 2.2MM RJ 4 +2.8MM DISP (INSTRUMENTS)
FORCEPS BIOPSY HOT 240CM 2.2MM RJ 4 +2.8MM DISPO (SURGICAL INSTRUMENTS) IMPLANT
FORCEPS BIOPSY HOT 240CM 2.2MM_RJ 4 +2.8MM DISP (INSTRUMENTS)
FORCEPS BIOPSY MICROMESH TTH STREAMLINE CATH 240CM 2.4MM RJ 4 SS LRG CPC STRL DISP ORNG 2.8MM WRK (GUIDING) IMPLANT
FORCEPS BIOPSY NEEDLE 160CM 1.8MM RJ 4 DISP YW 2MM WRK CHNL GSPED (SURGICAL INSTRUMENTS) IMPLANT
FORCEPS BIOPSY NEEDLE 160CM 1._8MM RJ 4 PED 2+ MM DISP (INSTRUMENTS)
FORCEPS BIOPSY NEEDLE 240CM 2.2MM RJ 4 2.8MM STD CPC STRL DISP ORNG (SURGICAL INSTRUMENTS) IMPLANT
FORCEPS BIOPSY NEEDLE 240CM 2._2MM RJ 4 2.8MM STD CPC STRL (INSTRUMENTS)
FORMALIN 10% BUFF 8ML_23032059 100EA/PK (CHEM)
INK ENDOSCOPIC MARKER SPOT 5ML GIS44 STERILE 10EA/BX (MISCELLANEOUS PT CARE ITEMS) IMPLANT
ISNARE SYSTEM HEX SNARE 00711088 W/25GA NDL 5EA/BX (BASKET) IMPLANT
JELLY LUB EZ BCTRST H2O SOL NG_RS PKT STRL 5GM LF (WOUND CARE/ENTEROSTOMAL SUPPLY) ×2
LINER SUCT RD CRD MEDIVAC TW LOCK LID SHTOF VALVE CAN FILTER 1500CC LF  DISP (Suction) ×1 IMPLANT
LINER SUCT RD CRD MEDIVAC TW L_OCK LID SHTOF VALVE CAN FLTR (Suction) ×1
LOOP AUTOCLAV HLDR DTCH 30MM S_URG NYL PLPCTM NONST DISP (INSTRUMENTS ENDOMECHANICAL)
LOOP ENDO DETACH 20MM MAJ340 (GU) IMPLANT
LOOP HLDR DTCH AUTOCLAV 30MM SURG NYL PLPCTM NONST LF  DISP (ENDOSCOPIC SUPPLIES) IMPLANT
NEEDLE SCLRTX 25GA 2.3MM OPTC TIP SHTH STRL LF DISP YW (NEEDLES & SYRINGE SUPPLIES) IMPLANT
NEEDLE SCLRTX 25GA 2.5MM INJ LL SPRG LD HNDL SHEATH LF  CRLK 230CM 5MM SS TFLN (Other Miscellaneous) IMPLANT
NEEDLE SCLRTX 25GA 2.5MM INJ L_L SPRG LD HNDL SHTH STRL DISP (Other Miscellaneous)
PROBE COAG 7.2FT 6.9FR FIAPC CRCMF PLUG PLAY FUNCTIONALITY FILTER (ENDOSCOPIC SUPPLIES) IMPLANT
PROBE ESURG 220CM 2.3MM FIAPC FLXB STR FIRE STRL DISP (CAUTERY SUPPLIES) IMPLANT
PROBE ESURG 220CM 2.3MM FIAPC_FLXB STR FIRE ARGON PLAS COAG (CAUTERY SUPPLIES)
PROBE ESURG 7.2FT 6.9FR FIAPC_CRCMF PLUG PLAY FUNCTIONALITY (INSTRUMENTS ENDOMECHANICAL)
SET CAP ENDO PUMPTUBE 2032522_EA (GENE)
SET IV UNIV EXT DUAL Y W/SLIDE CLMP NEEDLELESS 2C6612 (IV TUBING & ACCESSORIES) IMPLANT
SET TUBING ERBEFLOW CAP 24 HR ENDOSCP PUMP STRL DISP ORDER 10EA (GENE) IMPLANT
SNARE 9MM 230CM 2.4MM EXACTO COLD BRD WRE CLEAN CUT ENDOS PLC RESCT STRL LF  DISP (ENDOSCOPIC SUPPLIES) IMPLANT
SNARE 9MM 230CM 2.4MM EXACTO C_OLD BRD WRE CLEAN CUT ENDOS (INSTRUMENTS ENDOMECHANICAL)
SNARE SM OVAL 240CM 2.4MM SNS LOOP SHRTHRW FLXB ENDOS PLPCTM 13MM STRL LF  DISP (DIAGNOSTIC) IMPLANT
SNARE SM OVL 240CM 2.4MM SNS L_OOP SHRTHRW FLXB ENDOS PLPCTM (DIAGNOSTIC)
SOLIDIFY FLUID 1500ML DSPNSR L_Q TX SOLIDIFY SFTP LTS+ DISP (STER) ×2
SOLUTION IRRG H2O 500CC 2F7113 (SOLUTIONS) ×1
SYRINGE INFLAT ALN II GA STRL DISP 60ML (NEEDLES & SYRINGE SUPPLIES) IMPLANT
SYRINGE INFLAT ALN II GA STRL_DISP 60ML (NEEDLES & SYRINGE SUPPLIES)
SYRINGE LL 50ML LF  STRL GRAD N-PYRG DEHP-FR PVC FREE MED DISP CLR (NEEDLES & SYRINGE SUPPLIES) IMPLANT
SYRINGE LL 50ML LF STRL GRAD_N-PYRG DEHP-FR PVC FREE MED (NEEDLES & SYRINGE SUPPLIES)
TUBING OXYGEN 50/CS 001302 (TUBE/TUBING & SUCTION SUPPLIES)
TUBING SUCT CLR 20FT 9/32IN MEDIVAC NCDTV M/M CONN STRL LF (Suction) ×1 IMPLANT
TUBING SUCT CONN 20FT LONG_STRL N720A (Suction) ×1
WATER STRL 500ML PLASTIC PR BTL LF (SOLUTIONS) ×1 IMPLANT

## 2013-11-22 NOTE — Nurses Notes (Signed)
Abdomen soft to palpate, Report called to area F nurse. Findings to be discussed with pt by Dr Girard Cooter.

## 2013-11-22 NOTE — Anesthesia Postprocedure Evaluation (Signed)
ANESTHESIA POSTOP EVALUATION NOTE        Anesthesia Service      [facility]     11/22/2013     Last Vitals: Temperature: 36.3 C (97.3 F) (11/22/13 1111)  Heart Rate: 91 (11/22/13 1111)  BP (Non-Invasive): 157/76 mmHg (11/22/13 0948)  Respiratory Rate: 19 (11/22/13 1111)  SpO2-1: 94 % (11/22/13 1111)  Pain Score (Numeric, Faces): 0 (11/22/13 4967)    Procedure(s):  COLONOSCOPY    Patient is sufficiently recovered from the effects of anesthesia to participate in the evaluation and has returned to their pre-procedure level.  I have reviewed and evaluated the following:  Respiratory Function: Consistent with pre anesthetic level  Cardiovascular Function: Consistent with pre anesthetic level  Mental Status: Return to pre anesthetic baseline level  Pain: Sufficiently controlled with medication  Nausea and Vomiting: Absent or sufficiently controlled with medication  Post-op Anesthetic Complications: None    Comment/ re-evaluation for any variations: None

## 2013-11-22 NOTE — H&P (Addendum)
[facility]   GI Admission History and Physical      Christy Hale, Christy Hale   MRN:  [id number]  Date of Birth:  [birth year]    Date of Procedure:  11/22/2013    Chief Complaint: Rectal bleeding    HPI: Christy, Hale is a 73 y.o. year old female who presents today for Colonoscopy.   This is the patient's 1st colonoscopy..    This procedure is being done to evaluate Change in bowel habits and Hematochezia.    The patient denies abdominal pain, melena, any known history of anemia or unintentional weight loss.    Past Medical History   Diagnosis Date    Hypertension     Ulcer disease     Lung cancer      left lung, unknown type    Arthritis     Diabetes mellitus     Hyperlipidemia     History of transfusion      no reaction    GERD (gastroesophageal reflux disease)      hx of, no longer takes meds    Kidney disease      s/p nephrectomy due to cancer    cancer      s/p renal cancer, lung cancer, skin cancer, and now with bladder cancer    Shortness of breath      with humidity    Seizures      x 3 2012 none since then    Muscle weakness     Urinary tract infection     Type 2 diabetes mellitus      dx 20 + yrs ago, fasting up to 311 low during night    Morbid obesity     H/O carcinoma of bladder        Allergies   Allergen Reactions    Glucophage [Metformin]        Medications Prior to Admission    Outpatient Medications    amoxicillin (AMOXIL) 500 mg Oral Capsule    Take 500 mg by mouth Twice daily    Cholecalciferol, Vitamin D3, (VITAMIN D-3) 400 unit Oral Capsule    Take 1 Tab by mouth Once a day    citalopram (CELEXA) 40 mg Oral Tablet    Take 40 mg by mouth Once a day    cloNIDine (CATAPRES-TTS) 0.1 mg/24 hr Transdermal Patch Weekly    ferrous sulfate (FERATAB) 324 mg (65 mg iron) Oral Tablet, Delayed Release (E.C.)    Take 324 mg by mouth Every morning before breakfast    furosemide (LASIX) 20 mg Oral Tablet    20 mg Every other day     INSULIN GLARGINE,HUM.REC.ANLOG (LANTUS SUBQ)     50 Units by Subcutaneous route Every night     INSULIN LISPRO (HUMALOG SUBQ)    by Subcutaneous route Twice daily On a sliding scale  Takes 22 to 28 units on sliding scale    KLOR-CON M20 20 mEq Oral Tab Sust.Rel. Particle/Crystal    20 mEq Every 48 hours     levothyroxine (SYNTHROID) 25 mcg Oral Tablet    Take 25 mcg by mouth Once a day     metoclopramide HCl (REGLAN) 5 mg Oral Tablet    5 mg Three times daily before meals     NIACIN, INOSITOL NIACINATE, ORAL    take 500 mg by mouth Once a day.    omeprazole (PRILOSEC) 20 mg Oral Capsule, Delayed Release(E.C.)    Take 20 mg by mouth Twice daily  promethazine (PHENERGAN) 25 mg Oral Tablet    Take 25 mg by mouth Three times a day as needed    sennosides-docusate sodium (SENOKOT-S) 8.6-50 mg Oral Tablet    take 1 Tab by mouth QPM.    simvastatin (ZOCOR) 40 mg Oral Tablet    40 mg Once a day     traMADol (ULTRAM) 50 mg Oral Tablet    Three times a day as needed One tab at bedtime           Past Surgical History   Procedure Laterality Date    Hx hysterectomy      Hx hand surgery       bilateral    Hx foot surgery       right    Hx other       left arm surgery    Hx nephrectomy       with ureterectomy (left) left nephrectomy    Hx other       left lung cancer removal    Hx laser eye surgery       laser OU for DM    Hx cataract removal  06/10/2012     CE IOL OD w/avastin    Hx cataract removal Left 07/13/12     CE IOL OS    Hx cystectomy  12/31/2010     radical cystectomy        Physical Exam:  HEENT: Airway patent, Trachea midline, No large goiters or neck lymphadenopathy  Heart: Regular rate and rhythm, no murmurs  Lungs: Clear to auscultation bilaterally without decreased breath sounds, rhonchi, or wheezes  Abdomen: Soft, nondistended, positive bowel sounds, nontender      Assessment:  Hematochezia    Plan:  Proceed with Colonoscopy.     Orders Placed This Encounter    CANCELED: URINE CULTURE,ROUTINE    CANCELED: OVA AND PARASITE SCREEN    CANCELED:  FECAL WHITE BLOOD CELLS    CANCELED: CLOSTRIDIUM DIFFICILE TOXIN DETECTION    NEPHROSTOMY CULTURE    ROUTINE STOOL CULTURE (INCLUDING E. COLI SHIGA TOXIN)    XR AP MOBILE CHEST    URINALYSIS WITH CULTURE REFLEX    URINALYSIS, MICROSCOPIC    CBC/DIFF    BASIC METABOLIC PANEL, NON-FASTING    MAGNESIUM    PHOSPHORUS    PT/INR    PTT (PARTIAL THROMBOPLASTIN TIME)    HEPATIC FUNCTION PANEL    CANCELED: CALPROTECTIN, FECES    C-REACTIVE PROTEIN(CRP),INFLAMMATION    BLOOD/SPECIMEN IN LAB    CREATININE URINE, RANDOM    SODIUM, RANDOM URINE    UREA NITROGEN, RANDOM URINE    PHOSPHORUS    MAGNESIUM    CBC/DIFF    BASIC METABOLIC PANEL, NON-FASTING    CBC/DIFF    BASIC METABOLIC PANEL, NON-FASTING    CANCELED: HGA1C (HEMOGLOBIN A1C WITH EST AVG GLUCOSE)    HGA1C (HEMOGLOBIN A1C WITH EST AVG GLUCOSE)    PTT (PARTIAL THROMBOPLASTIN TIME)    PT/INR    CBC/DIFF    BASIC METABOLIC PANEL, NON-FASTING    ECG 12-LEAD    POCT WHOLE BLOOD GLUCOSE    POCT WHOLE BLOOD GLUCOSE    POCT WHOLE BLOOD GLUCOSE    POCT WHOLE BLOOD GLUCOSE    POCT WHOLE BLOOD GLUCOSE    POCT WHOLE BLOOD GLUCOSE    POCT WHOLE BLOOD GLUCOSE    POCT WHOLE BLOOD GLUCOSE    POCT WHOLE BLOOD GLUCOSE    POCT WHOLE BLOOD GLUCOSE    POCT  WHOLE BLOOD GLUCOSE    POCT WHOLE BLOOD GLUCOSE    POCT WHOLE BLOOD GLUCOSE    POCT WHOLE BLOOD GLUCOSE    POCT WHOLE BLOOD GLUCOSE    POCT WHOLE BLOOD GLUCOSE    TYPE AND SCREEN    INSERT & MAINTAIN PERIPHERAL IV ACCESS    PATIENT CLASS/LEVEL OF CARE DESIGNATION    IR BIOPSY OF    cloNIDine (CATAPRES-TTS) transdermal patch (mg/24 hr)    levothyroxine (SYNTHROID) tablet    citalopram (CELEXA) tablet    metoclopramide (REGLAN) tablet    pantoprazole (PROTONIX) delayed release tablet    promethazine (PHENERGAN) tablet    simvastatin (ZOCOR) tablet    traMADol (ULTRAM) tablet    SSIP insulin lispro (HUMALOG) 100 units/mL injection    NS flush syringe    And    NS flush syringe     insulin glargine (LANTUS) 100 units/mL injection    morphine 2 mg/mL injection    acetaminophen (TYLENOL) tablet    sennosides-docusate sodium (SENOKOT-S) 8.6-50mg  per tablet    polyethylene glycol (MIRALAX) oral packet    polyethylene glycol electrolyte (goLYTELY) oral liquid    NS premix infusion    trimethoprim-sulfamethoxazole (BACTRIM DS) 160-800mg  per tablet    amLODIPine (NORVASC) tablet    prochlorperazine (COMPAZINE) 5 mg/mL injection    ondansetron (ZOFRAN) 2 mg/mL injection           =====================================================  11/22/2013    I have seen and examined the patient.  I reviewed the fellow's note.  I agree with the findings and plan of care as documented in the fellow's note.  Any exceptions/additions are edited/noted.    Rectal bleeding    Sharyn Creamer, MD, [facility]   11/22/2013, 10:21  Assistant Professor of Medicine  Section of [place]

## 2013-11-22 NOTE — CDI WORKSHEET (Addendum)
DRG NLV      Working DRG 1: 161 W.I. hemorrhage w CC  3/3   Principle Diagnosis:  Rectal bleeding        Working DRG 2: 960 Other digestive system O.R. procedures w CC  3/3   Principle Diagnosis:  Rectal bleeding       Principle Procedure: Procedure Date:   Secondary Dx:  UTI - with nephrostomy - cultures from tube grew achromobacter xylosoxidans  AKI  CHF  Dehydration  Hypothyroid  DM 2  RLQ mass  Tremor  S/p nephrectomy  HTN  Hx lung cancer  Hx bladder cancer  Anemia     Secondary Procedures:    2/4 -  Stoltzfus  Perc biopsy - abdominal wall mass     Past Medical Hx:     Comments:     Home Medications:        Final MS-DRG:  454 Other digestive system O.R. procedures w CC     Reason Code: 1-Final/Concurrent DRG Match - Optimal Selection    Comments:     Final Coder:  Eilleen Kempf

## 2013-11-22 NOTE — Progress Notes (Addendum)
[facility]  Medicine Progress Note    Christy Hale  Date of service: 11/22/2013  Date of Admission:  11/19/2013    Hospital Day:  LOS: 3 days     Subjective:  - NPO since midnight, Finished GoLytely prep  - Colonoscopy scheduled for 1 pm  - Had nausea and vomiting overnight, received IV compazine and IV Zofran.  - Had light brown stool, no blood. Only 1 small bowel movement with Go-Lytely.   - Not complaining of abdominal pain.   - Insistent that bleeding is coming from rectum     Vital Signs:  Temp  Avg: 36.7 C (98 F)  Min: 36.4 C (97.5 F)  Max: 36.9 C (98.4 F)    Pulse  Avg: 77.7  Min: 68  Max: 88 BP  Min: 132/61  Max: 180/80   Resp  Avg: 18.6  Min: 18  Max: 20 SpO2  Avg: 96 %  Min: 96 %  Max: 96 %   Pain Score (Numeric, Faces): 0      Input/Output    Intake/Output Summary (Last 24 hours) at 11/22/13 0800  Last data filed at 11/22/13 0700   Gross per 24 hour   Intake   1940 ml   Output    700 ml   Net   1240 ml    I/O last shift:          Current Facility-Administered Medications:  acetaminophen (TYLENOL) tablet 650 mg Oral Q4H PRN   amLODIPine (NORVASC) tablet 10 mg Oral Daily   citalopram (CELEXA) tablet 40 mg Oral Daily   cloNIDine (CATAPRES-TTS) transdermal patch (mg/24 hr) 0.1 mg Transdermal Q7 Days   insulin glargine (LANTUS) 100 units/mL injection 30 Units Subcutaneous 2x/day   levothyroxine (SYNTHROID) tablet 25 mcg Oral QAM   metoclopramide (REGLAN) tablet 5 mg Oral 3x/day AC   morphine 2 mg/mL injection 2 mg Intravenous Q4H PRN   NS flush syringe 2 mL Intracatheter Q8HRS   And      NS flush syringe 2-6 mL Intracatheter Q1 MIN PRN   NS premix infusion  Intravenous Continuous   ondansetron (ZOFRAN) 2 mg/mL injection 4 mg Intravenous Q8H PRN   pantoprazole (PROTONIX) delayed release tablet 20 mg Oral 2x/day AC   polyethylene glycol (MIRALAX) oral packet 17 g Oral Daily PRN   promethazine (PHENERGAN) tablet 25 mg Oral 3x/day PRN   sennosides-docusate sodium (SENOKOT-S)  8.6-50mg  per tablet 1 Tab Oral 2x/day   simvastatin (ZOCOR) tablet 40 mg Oral QPM   SSIP insulin lispro (HUMALOG) 100 units/mL injection 3-9 Units Subcutaneous 4x/day PRN   traMADol (ULTRAM) tablet 50 mg Oral 3x/day PRN   trimethoprim-sulfamethoxazole (BACTRIM DS) 160-800mg  per tablet 1 Tab Oral Q12H       Physical Exam:  General: acutely ill and no distress. Eating with no difficulty   Eyes: Conjunctiva clear.  HENT:Head atraumatic and normocephalic, Mouth mucous membranes moist  Neck: no adenopathy  Lungs: Clear to auscultation bilaterally.   Cardiovascular: regular rate and rhythm, S1, S2 normal, no murmur, click, rub or gallop  Abdomen: Bowel sounds normal, non-distended, soft, tender on right flank. No palpable mass  Extremities: No cyanosis or edema  Skin: Skin warm and dry  Neurologic: Grossly normal, pin-rolling tremor b/l, more pronounced in right hand. Indiscriminate cogwheel rigidity noted.   Psychiatric: Affect Normal  Genital: Speculum exam shows normal appearing vaginal mucosa in vault, no blood or lesions noted. Cervix with no friable tissue. External exam shows punctate white  lesions, no bleeding ulcers.   Rectal: No hemorrhoids, fissures, or fistulas. No occult blood.     Labs:      CBC:  Recent Labs      11/20/13   0402  11/21/13   0536  11/22/13   0442   WBC  9.4  9.9  12.3*   HGB  11.3  11.4  10.9*   HCT  33.8  34.1  32.1*   PLTCNT  273  277  285     Differential:   Recent Labs      11/20/13   0402  11/21/13   0536  11/22/13   0442   PMNS  53  60  68   LYMPHOCYTES  31  25  23    MONOCYTES  11  10  7    EOSINOPHIL  4  4  1    BASOPHILS  1  1  1    PMNABS  5.045  6.009  8.428*   LYMPHSABS  2.870  2.522  2.803   MONOSABS  1.002*  0.981  0.870   EOSABS  0.400  0.348  0.124   BASOSABS  0.066  0.074  0.122     BMP:  Recent Labs      11/20/13   0402  11/21/13   0536  11/22/13   0442   SODIUM  141  140  140   POTASSIUM  4.1  4.0  3.9   CHLORIDE  112*  108  105   CO2  19*  21*  23   BUN  19  16  15       CREATININE  1.23*  1.25*  1.32*   GLUCOSENF  131  136  143*   ANIONGAP  10  11  12    GFR  45*  45*  42*     Recent Labs      11/20/13   0402  11/21/13   0536  11/22/13   0442   CALCIUM  8.9  9.6  9.2   MAGNESIUM  1.4*   --    --    PHOSPHORUS  3.0   --    --      RBC Indices:  Recent Labs      11/20/13   0402  11/21/13   0536  11/22/13   0442   MCV  85.1  84.5  84.2   MCH  28.5  28.1  28.7   MCHC  33.5  33.3  34.0   RDW  14.3  14.0  14.1   MPV  8.1  8.0  8.1     Coagulation Studies:  Recent Labs      11/21/13   0832   INR  1.05   PROTHROMTME  11.4   APTT  33.0     Radiology:           PT/OT: No    Consults: GI     Hardware (lines, foley's, tubes): Implantable port on left, PIV L. Cephalic, Urostomy on right     Assessment/ Plan:   Active Hospital Problems    Diagnosis    Primary Problem: BRBPR (bright red blood per rectum)    Mass in the abdomen    UTI (urinary tract infection)    HTN (hypertension)    AKI (acute kidney injury)    Type 2 diabetes mellitus     Summary: 73 yo female w/PMH of CHF, HTN, DM, seizures, TCC s/p left nephrectomy, cystectomy, hysterectomy, and lung  cancer s/p LLL resection transferred from outside facility for BRBPR and abdominal mass on CT.    BRBPR, Abdominal pain  - Hemoccult negative at [facility]; CT abdomen/pelvis at outside facility demonstrating RLQ abdominal wall mass. CT on Image Grid  - Hgb 10.9, Hct 32.1 have remained stable   - Monitoring H/H daily   - GI consulted; appreciate recommendations   - Colonoscopy was normal, no signs of bleeding  - Rectal and vaginal exams with no signs of physical deformtity, bleeding.       R. Abdominal Wall mass  - Demonstrated on CT scan at outside facility   - History of transitional cell carcinoma, recurrence/new primaries   - Scheduled biopsy with IR for tomorrow. NPO after midnight.     AKI  - Creatinine today is 1.32; rise likely secondary to bowel clean out. Will monitor   - FeNa 2.2%--> intrinsic renal disease, likely related to  UTI      UTI  - Urine culture from outside facility grew Achromobacter Xylosoxidans.   - Bactrim double strength tablet bid     HTN  - Continuing Clonidine transdermal patch 0.1 mg changed every Sunday  - Norvasc to 10 mg daily     DM I  - Basal dosing of Lantus 30 units bid; Monitoring to see if basal rate needs increased   - SSI protocol     Tremor  - Pin-rolling tremor with cogwheel rigidity elicited on exam today.   - Patient has not had this evaluated before. Will scheduled outpatient Neurology follow up for evaluation.     Hypothyroidism  - Continuing synthroid 25 mcg qam     CHF  - Conservative fluid management  - Holding Lasix at time for AKI     Depression  - Celexa 40 mg daily     HLD  - Zocor 40 mg qpm     DVT/PE Prophylaxis: Not indicated    Disposition Planning: Home discharge      Masih Janace Aris, MD 11/22/2013, 08:00          I saw and examined the patient.  I reviewed the resident's note.  I agree with the findings and plan of care as documented in the resident's note.  Any exceptions/additions are edited/noted.    Loistine Simas, MD 11/22/2013, 19:59

## 2013-11-22 NOTE — Care Plan (Signed)
Problem: General Plan of Care(Adult,OB)  Goal: Plan of Care Review(Adult,OB)  The patient and/or their representative will communicate an understanding of their plan of care   Outcome: Ongoing (see interventions/notes)  Plan of care reviewed with patient. Patient NPO since midnight for colonoscopy today. Experienced bouts of vomiting, received IV Compazine and IV Zofran. Stool was light brown and semi-formed with no active signs of bleeding. Only had 1 small BM while drinking go-lightly. No complaints of pain. Has remained free of falls and injuries.

## 2013-11-22 NOTE — Nurses Notes (Signed)
Abdomen tender to touch, pre-op instructions given and pt acknowledges understanding.

## 2013-11-22 NOTE — CDI REVIEW (Signed)
Admissions, Findings, and Consultations:  MEDICINE  1/31 - 73 y.o., White female with history of CHF, HTN, DM, seizures, TCC s/p left nephrectomy, cystectomy, hysterectomy, and ?possible lung cancer s/p LLL resection who was transferred from [facility] for BRBPR  BRBPR, Abdominal Pain - Two-day history of presumed BRBPR; no BM with blood.   Hemoccult negative currently and pad shows only small amount of dried blood.   - CT abd/pelvis at outside facility shows RLQ abdominal wall mass  Right Abdominal Wall Mass - Mass noted on CT scan. - Patient with mild RLQ pain   - Concerning for malignancy in setting of known hx of TCC and possible other cancers  Acute Kidney Injury in the Setting of Hx of Transitional Cell Cancer s/p Cystectomy and L Nephrectomy with Nephrostomy in Place   - Creatinine 1.8 at outside facility. Now 8.36  Complicated UTI s/p nephrostomy and left nephrectomy - UA positive for leuks, nitrites   - Patient has nephrostomy and only right kidney.   CHF - Previous LVEF unknown. Secondary to leaky valves, per patient/family.  2/1 - UTI - UA on presentation with positive nitrite, large leuks, 182 WBC   - Nephrostomy culture showing gram+ cocci in pairs, gram- rods  2/2 - Clinically dehydrated appearing with skin tenting, also looks tired today  UTI  - Urine culture from outside facility grew Achromobacter Xylosoxidans.   Tremor - Pin-rolling tremor with cogwheel rigidity elicited on exam today.     DIGESTIVE  1/31 - Chief Complaint: Rectal bleeding, Anemia     Diagnostics and Results:   Procedures and Treatments:   Meds, IV's, Rx Blood:   Comments:

## 2013-11-22 NOTE — Anesthesia Transfer of Care (Signed)
ANESTHESIA TRANSFER OF CARE NOTE        Anesthesia Service      [facility]         Last Vitals: Temperature: 36.3 C (97.3 F) (11/22/13 1111)  Heart Rate: 91 (11/22/13 1111)  BP (Non-Invasive): 157/76 mmHg (11/22/13 0948)  Respiratory Rate: 19 (11/22/13 1111)  SpO2-1: 94 % (11/22/13 1111)  Pain Score (Numeric, Faces): 0 (11/22/13 4259)    Patient transferred to area F in stable condition. Report given to RN.    2/3/2015at 11:11.

## 2013-11-22 NOTE — Care Plan (Signed)
Problem: General Plan of Care(Adult,OB)  Goal: Plan of Care Review(Adult,OB)  The patient and/or their representative will communicate an understanding of their plan of care   Outcome: Ongoing (see interventions/notes)  Pt had scope this Am  Awaiting on results Pt had mass on abdominal wall will get biopsy tomorrow in CT scan unsure of time  Pt given instructions with all contacts and medications

## 2013-11-23 ENCOUNTER — Inpatient Hospital Stay (HOSPITAL_COMMUNITY): Payer: Commercial Managed Care - PPO

## 2013-11-23 DIAGNOSIS — R1900 Intra-abdominal and pelvic swelling, mass and lump, unspecified site: Secondary | ICD-10-CM

## 2013-11-23 LAB — BASIC METABOLIC PANEL
ANION GAP: 12 mmol/L (ref 4–13)
BUN/CREAT RATIO: 9 (ref 6–22)
BUN: 13 mg/dL (ref 8–25)
CALCIUM: 9 mg/dL (ref 8.5–10.4)
CARBON DIOXIDE: 22 mmol/L (ref 22–32)
CHLORIDE: 108 mmol/L (ref 96–111)
CREATININE: 1.39 mg/dL — ABNORMAL HIGH (ref 0.49–1.10)
ESTIMATED GLOMERULAR FILTRATION RATE: 39 mL/min/{1.73_m2} — ABNORMAL LOW (ref >59)
GLUCOSE,NONFAST: 82 mg/dL (ref 65–139)
POTASSIUM: 4 mmol/L (ref 3.5–5.1)
SODIUM: 142 mmol/L (ref 136–145)

## 2013-11-23 LAB — PERFORM POC WHOLE BLOOD GLUCOSE
GLUCOSE, POINT OF CARE: 97 mg/dL (ref 70–105)
GLUCOSE, POINT OF CARE: 97 mg/dL (ref 70–105)
GLUCOSE, POINT OF CARE: 243 mg/dL — ABNORMAL HIGH (ref 70–105)
GLUCOSE, POINT OF CARE: 56 mg/dL — ABNORMAL LOW (ref 70–105)
GLUCOSE, POINT OF CARE: 121 mg/dL — ABNORMAL HIGH (ref 70–105)
GLUCOSE, POINT OF CARE: 129 mg/dL — ABNORMAL HIGH (ref 70–105)

## 2013-11-23 LAB — CBC/DIFF
BASOPHILS: 1 %
BASOS ABS: 0.062 10*3/uL (ref 0.000–0.200)
BASOS ABS: 0.062 THOU/uL (ref 0.000–0.200)
EOS ABS: 0.097 10*3/uL (ref 0.000–0.500)
EOSINOPHIL: 1 %
HCT: 34.2 % (ref 33.5–45.2)
HGB: 11.6 g/dL (ref 11.2–15.2)
LYMPHOCYTES: 18 %
LYMPHS ABS: 2.018 10*3/uL (ref 1.000–4.800)
MCH: 28.7 pg (ref 27.4–33.0)
MCHC: 33.8 g/dL (ref 32.5–35.8)
MCV: 84.8 fL (ref 78–100)
MONOCYTES: 7 %
MONOS ABS: 0.717 10*3/uL (ref 0.300–1.000)
MPV: 7.9 fL (ref 7.5–11.5)
PLATELET COUNT: 274 10*3/uL (ref 140–450)
PMN ABS: 8.104 10*3/uL — ABNORMAL HIGH (ref 1.500–7.700)
PMN ABS: 8.104 THOU/uL — ABNORMAL HIGH (ref 1.500–7.700)
PMN'S: 73 %
RBC: 4.03 MIL/uL (ref 3.63–4.92)
RDW: 14.4 % (ref 12.0–15.0)
WBC: 11 10*3/uL (ref 3.5–11.0)

## 2013-11-23 LAB — OSMOLALITY, RANDOM URINE
OSMOLALITY, URINE: 307 mOsm/kg (ref 50–1400)
OSMOLALITY, URINE: 307 mosm/kg (ref 50–1400)

## 2013-11-23 LAB — CREATININE URINE, RANDOM: CREATININE, UR RAND: 45 mg/dL — AB

## 2013-11-23 LAB — NEPHROSTOMY CULTURE

## 2013-11-23 LAB — SODIUM, RANDOM URINE: SODIUM, URINE RAND: 103 mmol/L — AB

## 2013-11-23 LAB — MAGNESIUM: MAGNESIUM: 1.5 mg/dL — ABNORMAL LOW (ref 1.6–2.5)

## 2013-11-23 LAB — CHLORIDE,RANDOM URINE: CHLORIDE, URINE RAND: 86 mmol/L — AB

## 2013-11-23 LAB — PHOSPHORUS
PHOSPHORUS: 3.8 mg/dL (ref 2.3–4.0)
PHOSPHORUS: 3.8 mg/dL (ref 2.3–4.0)

## 2013-11-23 MED ORDER — MIDAZOLAM 1 MG/ML INJECTION SOLUTION
0.50 mg | INTRAMUSCULAR | Status: DC | PRN
Start: 2013-11-23 — End: 2013-11-23
  Administered 2013-11-23 (×3): 1 mg via INTRAVENOUS

## 2013-11-23 MED ORDER — FENTANYL (PF) 50 MCG/ML INJECTION SOLUTION
INTRAMUSCULAR | Status: AC
Start: 2013-11-23 — End: 2013-11-23
  Filled 2013-11-23: qty 4

## 2013-11-23 MED ORDER — MIDAZOLAM 1 MG/ML INJECTION SOLUTION
INTRAMUSCULAR | Status: AC
Start: 2013-11-23 — End: 2013-11-23
  Filled 2013-11-23: qty 4

## 2013-11-23 MED ORDER — FENTANYL (PF) 50 MCG/ML INJECTION SOLUTION
25.00 ug | INTRAMUSCULAR | Status: DC | PRN
Start: 2013-11-23 — End: 2013-11-23
  Administered 2013-11-23 (×3): 50 ug via INTRAVENOUS

## 2013-11-23 MED ADMIN — sodium chloride 0.9 % (flush) injection syringe: INTRAVENOUS | @ 08:00:00

## 2013-11-23 MED ADMIN — sodium chloride 0.9 % (flush) injection syringe: @ 22:00:00

## 2013-11-23 MED ADMIN — lactated Ringers intravenous solution: ORAL | @ 17:00:00

## 2013-11-23 NOTE — OR Surgeon (Signed)
[facility]   Brief Post-Interventional Radiology Procedure Note    Patient Name: Christy Hale [facility] Number: [id number]  Date of Birth: 1941/09/29  Date of Service: 11/23/2013    PROCEDURE: Core biopsy of abdominal mass  Attending: Leslie Dales, MD (staff)   Assistant: none  Preop Diagnosis: abdominal mass  Postop Diagnosis: same  Anesthesia: Moderate sedation  Estimated Blood Loss: None  Specimens Removed: None    FINDINGS/INTERVENTIONS/COMPLICATIONS: 18 g core biopsy of abdominal mass completed.  No complications. Full dictation to follow.    RECOMMENDATIONS & FOLLOW-UP: referring physician    Sheela Stack, PA-C 11/23/2013, 08:22

## 2013-11-23 NOTE — PT Evaluation (Signed)
[facility]  Physical Therapy Initial Evaluation    Patient Name: Christy Hale  Date of Birth: June 01, 1941  Height:  157.5 cm (5\' 2" )  Weight: 96.4 kg (212 lb 8.4 oz)  Room/Bed: 807/A  Payor: [employer] MEDICARE / Plan: [employer] CHOICE PPO / Product Type: PPO /      Date/Time of Admission: 11/19/2013  2:27 AM  Admitting Diagnosis:  GI bleed  Rectal bleeding      HPI:     Christy Hale is a 73 y.o. female admitted from [facility] with BRBPR. +Abdominal mass. She underwent a colonoscopy 11/22/13 and an IR guided Bx this morning. She has h/o Lung CA, s/p LLL resection, Nephrectomy '08 with ileal conduit, Cystectomy 3/12.  Precautions: fall risk     Past Medical History   Diagnosis Date    Hypertension     Ulcer disease     Lung cancer      left lung, unknown type    Arthritis     Diabetes mellitus     Hyperlipidemia     History of transfusion      no reaction    GERD (gastroesophageal reflux disease)      hx of, no longer takes meds    Kidney disease      s/p nephrectomy due to cancer    cancer      s/p renal cancer, lung cancer, skin cancer, and now with bladder cancer    Shortness of breath      with humidity    Seizures      x 3 2012 none since then    Muscle weakness     Urinary tract infection     Type 2 diabetes mellitus      dx 20 + yrs ago, fasting up to 311 low during night    Morbid obesity     H/O carcinoma of bladder      Past Surgical History   Procedure Laterality Date    Hx hysterectomy      Hx hand surgery       bilateral    Hx foot surgery       right    Hx other       left arm surgery    Hx nephrectomy       with ureterectomy (left) left nephrectomy    Hx other       left lung cancer removal    Hx laser eye surgery       laser OU for DM    Hx cataract removal  06/10/2012     CE IOL OD w/avastin    Hx cataract removal Left 07/13/12     CE IOL OS    Hx cystectomy  12/31/2010     radical cystectomy       reports that she quit smoking about  32 years ago. Her smoking use included Cigarettes. She has a 45 pack-year smoking history. She has never used smokeless tobacco. She reports that she drinks alcohol. She reports that she does not use illicit drugs.  History   Smoking status    Former Smoker -- 3.00 packs/day for 15 years    Types: Cigarettes    Quit date: 10/20/1981   Smokeless tobacco    Never Used         Subjective:   The pt stated "I'm good to walk 50 ft, then I sit down for a bit."  She walks frequently  in her home, often to let her little dog out.  She feels better than when she came here. "I was scared, then."  She would like to take a walk. She has been walking to the [place].  When she goes to the store, she uses one of the power w/c shopping carts.   "I have to stand up all the time, though. Everything I need is on the top shelf."    -Patient Goals: none stated    -Pain: 0/10        Objective:   -seen in 807 in pm.  -the pt was in bed initially.  -Equipment: IV  -Cognition: Alert, Awake, Cooperative and Oriented Person, Place and Time  -Follows Directions: Complex      -ROM:     -UE's: WFL   -LE's: WFL    -Strength:    -UE's: WFL     -LE's: WFL    -Bed Mobility: supine to sit on EOB = supervision    -Transfers: sit to stand = supervision    -Ambulation: 150 ft once, 70 ft once, with standby assistance     -slow pace/pt pushed the IV pole     -seated rest between walks    -Balance:    -Sitting:  good               -Standing: fair plus    -Today's Treatment: ambulation        Assessment:   [name] is ambulatory, and appears safe to d/c to home, when she is medically ready for d/c. At this point, she doesn't appear to need PT after d/c. We will monitor her progress, and will only see her again if her medical status changes.    Rehab Potential: Good    Problem List: Decreased Ambulation  Barriers to Discharge to Home: none from a PT perspective    Goals:   -Patient will transfer independently by D/C to home.    -Patient will ambulate  independently 120 feet with an appropriate AD by D/C to home.        Discharge Needs:     Equipment Recommendations: none  Disposition Recommendations: home        Plan:   Current Intervention:the pt is to ambulate on her own, we will monitor her progress   To provide physical therapy services  1 times/day for 2 to 3 days/week until discontinued.      Therapist:   Roney Mans, PT 11/23/2013 15:51  Pager #:[phone]  Evaluation Time: 40 minutes  Time may include review of medical chart, obtaining patient's functional history from patient/family/medical staff/case management/ancillary personnel, collaboration on findings and treatment options (with the above mentioned individuals), re-assessment, and acute care rehabilitation.

## 2013-11-23 NOTE — Care Plan (Signed)
Problem: General Plan of Care(Adult,OB)  Goal: Plan of Care Review(Adult,OB)  The patient and/or their representative will communicate an understanding of their plan of care   Outcome: Ongoing (see interventions/notes)  Patient had biopsy of abdominal wall mass today. Dressing clean dry and intact with no drainage. No complaints of pain during my shift. Maintain IV fluids as ordered. Monitor labs vs and i&o. Ensure patient needs are met via hourly rounding per RN/CA.

## 2013-11-23 NOTE — Care Plan (Signed)
Problem: General Plan of Care(Adult,OB)  Goal: Plan of Care Review(Adult,OB)  The patient and/or their representative will communicate an understanding of their plan of care   Outcome: Ongoing (see interventions/notes)  Plan of care reviewed with patient. Patient NPO since midnight for abdominal mass biopsy today. No complaints of pain. No signs/symptoms of GI bleeding. NS 100/hour. Has remained free of falls and injuries.

## 2013-11-23 NOTE — Progress Notes (Addendum)
[facility]  Medicine Progress Note    Christy Hale  Date of service: 11/23/2013  Date of Admission:  11/19/2013    Hospital Day:  LOS: 4 days     Subjective:  - Had IR biopsy this morning, not complaining of pain post procedure  - Tolerating diet  - Has not had a BM since night before colonoscopy; no bleeding events either    Vital Signs:  Temp  Avg: 36.8 C (98.2 F)  Min: 36.3 C (97.4 F)  Max: 37.2 C (99 F)    Pulse  Avg: 76.7  Min: 66  Max: 153 BP  Min: 121/57  Max: 177/88   Resp  Avg: 14.9  Min: 11  Max: 18 SpO2  Avg: 97.9 %  Min: 94 %  Max: 100 %   Pain Score (Numeric, Faces): 0      Input/Output    Intake/Output Summary (Last 24 hours) at 11/23/13 1140  Last data filed at 11/23/13 0955   Gross per 24 hour   Intake   2420 ml   Output   1750 ml   Net    670 ml    I/O last shift:  02/04 0800 - 02/04 1559  In: 200 [I.V.:200]  Out: 375 [Urine:375]       Current Facility-Administered Medications:  acetaminophen (TYLENOL) tablet 650 mg Oral Q4H PRN   amLODIPine (NORVASC) tablet 10 mg Oral Daily   citalopram (CELEXA) tablet 40 mg Oral Daily   cloNIDine (CATAPRES-TTS) transdermal patch (mg/24 hr) 0.1 mg Transdermal Q7 Days   insulin glargine (LANTUS) 100 units/mL injection 30 Units Subcutaneous 2x/day   levothyroxine (SYNTHROID) tablet 25 mcg Oral QAM   metoclopramide (REGLAN) tablet 5 mg Oral 3x/day AC   morphine 2 mg/mL injection 2 mg Intravenous Q4H PRN   NS flush syringe 2 mL Intracatheter Q8HRS   And      NS flush syringe 2-6 mL Intracatheter Q1 MIN PRN   NS premix infusion  Intravenous Continuous   ondansetron (ZOFRAN) 2 mg/mL injection 4 mg Intravenous Q8H PRN   pantoprazole (PROTONIX) delayed release tablet 20 mg Oral 2x/day AC   polyethylene glycol (MIRALAX) oral packet 17 g Oral Daily PRN   promethazine (PHENERGAN) tablet 25 mg Oral 3x/day PRN   sennosides-docusate sodium (SENOKOT-S) 8.6-50mg  per tablet 1 Tab Oral 2x/day   simvastatin (ZOCOR) tablet 40 mg Oral QPM   SSIP insulin  lispro (HUMALOG) 100 units/mL injection 3-9 Units Subcutaneous 4x/day PRN   traMADol (ULTRAM) tablet 50 mg Oral 3x/day PRN   trimethoprim-sulfamethoxazole (BACTRIM DS) 160-800mg  per tablet 1 Tab Oral Q12H       Physical Exam:  General: NAD  Eyes: Conjunctiva clear.  HENT:Head atraumatic and normocephalic, Mouth mucous membranes moist  Neck: no adenopathy  Lungs: Clear to auscultation bilaterally.   Cardiovascular: regular rate and rhythm, S1, S2 normal, no murmur, click, rub or gallop  Abdomen: Bowel sounds normal, non-distended, soft, tender on right flank. Incision site C/D/I. Orange sterile prep apparent   Extremities: No cyanosis or edema  Skin: Skin warm and dry  Neurologic: Grossly normal, continues to demonstrate tremor with intention and at rest  Psychiatric: Affect Normal      Labs:  Date 11/23/13 0800 - 11/24/13 0759   Shift 0800-0759 24 Hour Total   I  N  T  A  K  E   I.V. 200 200      Volume (NS premix infusion) 200 200    Shift  Total 200 200   O  U  T  P  U  T   Urine 375 375      Urine (Voided) 250 250      Urostomy Output (Urostomy Right) 125 125    Stool        Stool Occurrence 1 x 1 x    Shift Total 375 375   NET -175 -175      CBC:  Recent Labs      11/21/13   0536  11/22/13   0442  11/23/13   0450   WBC  9.9  12.3*  11.0   HGB  11.4  10.9*  11.6   HCT  34.1  32.1*  34.2   PLTCNT  277  285  274     Differential:   Recent Labs      11/21/13   0536  11/22/13   0442  11/23/13   0450   PMNS  60  68  73   LYMPHOCYTES  25  23  18    MONOCYTES  10  7  7    EOSINOPHIL  4  1  1    BASOPHILS  1  1  1    PMNABS  6.009  8.428*  8.104*   LYMPHSABS  2.522  2.803  2.018   MONOSABS  0.981  0.870  0.717   EOSABS  0.348  0.124  0.097   BASOSABS  0.074  0.122  0.062     BMP:  Recent Labs      11/21/13   0536  11/22/13   0442  11/23/13   0450   SODIUM  140  140  142   POTASSIUM  4.0  3.9  4.0   CHLORIDE  108  105  108   CO2  21*  23  22   BUN  16  15  13    CREATININE  1.25*  1.32*  1.39*   GLUCOSENF  136  143*  82      ANIONGAP  11  12  12    GFR  45*  42*  39*     Recent Labs      11/21/13   0536  11/22/13   0442  11/23/13   0450   CALCIUM  9.6  9.2  9.0   MAGNESIUM   --    --   1.5*   PHOSPHORUS   --    --   3.8     RBC Indices:  Recent Labs      11/21/13   0536  11/22/13   0442  11/23/13   0450   MCV  84.5  84.2  84.8   MCH  28.1  28.7  28.7   MCHC  33.3  34.0  33.8   RDW  14.0  14.1  14.4   MPV  8.0  8.1  7.9     Coagulation Studies:  Recent Labs      11/21/13   0832   INR  1.05   PROTHROMTME  11.4   APTT  33.0     Radiology:           PT/OT: No    Consults: GI     Hardware (lines, foley's, tubes): Implantable port on left, PIV L. Cephalic, Urostomy on right     Assessment/ Plan:   Active Hospital Problems    Diagnosis    Primary Problem: BRBPR (bright red blood per rectum)    Mass in  the abdomen    UTI (urinary tract infection)    HTN (hypertension)    AKI (acute kidney injury)    Type 2 diabetes mellitus     Summary: 73 yo female w/PMH of CHF, HTN, DM, seizures, TCC s/p left nephrectomy, cystectomy, hysterectomy, and lung cancer s/p LLL resection transferred from outside facility for BRBPR and abdominal mass on CT.    BRBPR, Abdominal pain  - Hemoccult negative at [facility]; CT abdomen/pelvis at outside facility demonstrating RLQ abdominal wall mass. CT on Image Grid  - Hgb 11.6, Hct 34.2 have remained stable   - Monitoring H/H daily   - GI consulted; appreciate recommendations   - Colonoscopy was normal, no signs of bleeding  - If patient experiences another bleed, will consider tagged RBC scan or CTA immediately for evaluation      R. Abdominal Wall mass  - IR biopsy completed today. Will f/u pathology results     AKI  - Creatinine today is 1.39; has been fluctuating throughout stay  - Ordered urine lytes for further discrimination of etiology       UTI  - Urine culture from outside facility grew Achromobacter Xylosoxidans.   - Bactrim double strength tablet bid (11/28/13)    HTN  - Continuing Clonidine transdermal patch  0.1 mg changed every Sunday  - Norvasc to 10 mg daily     DM I  - Basal dosing of Lantus 30 units bid; Monitoring to see if basal rate needs increased   - SSI protocol     Deconditioning  - Will get PT/OT evaluation     Tremor  - Pin-rolling tremor with cogwheel rigidity elicited on exam today.   - Patient has not had this evaluated before. Will scheduled outpatient Neurology follow up for evaluation.     Hypothyroidism  - Continuing synthroid 25 mcg qam     CHF  - Conservative fluid management  - Holding Lasix at time for AKI     Depression  - Celexa 40 mg daily     HLD  - Zocor 40 mg qpm     DVT/PE Prophylaxis: Not indicated    Disposition Planning: Home discharge      Masih Janace Aris, MD 11/23/2013, 11:40            I saw and examined the patient.  I reviewed the resident's note.  I agree with the findings and plan of care as documented in the resident's note.  Any exceptions/additions are edited/noted.    Loistine Simas, MD 11/23/2013, 20:41

## 2013-11-23 NOTE — Nurses Notes (Signed)
Patient to CT 14.  Meds, allergies, history, and labs reviewed. Denies pain.

## 2013-11-23 NOTE — Ancillary Notes (Signed)
Stopped to offer diabetes education. Christy Hale had questions about whole grain versus white bread.  We discussed the benefits of whole grains in better glucose control.  We discussed the amount of carbohydrate in bread and the importance of portion control regardless of white or wheat bread.  She has no further questions at this time.

## 2013-11-23 NOTE — Nurses Notes (Signed)
Procedure complete, patient tolerated well.  When awake 98% RA, sleeping desats to 86%.  Sent back to 8W on 2L NC, report called to [employer], aware.  Dressing to right umbilicus c/d/i, no bleeding or drainage, surrounding skin WNL, abdomen soft.  Patient denies pain.  Returned to unit in bed by two transport staff.

## 2013-11-24 LAB — BASIC METABOLIC PANEL
ANION GAP: 9 mmol/L (ref 4–13)
BUN/CREAT RATIO: 8 (ref 6–22)
BUN: 11 mg/dL (ref 8–25)
CALCIUM: 8.6 mg/dL (ref 8.5–10.4)
CARBON DIOXIDE: 21 mmol/L — ABNORMAL LOW (ref 22–32)
CHLORIDE: 110 mmol/L (ref 96–111)
CREATININE: 1.34 mg/dL — ABNORMAL HIGH (ref 0.49–1.10)
ESTIMATED GLOMERULAR FILTRATION RATE: 41 mL/min/{1.73_m2} — ABNORMAL LOW (ref >59)
GLUCOSE,NONFAST: 59 mg/dL — ABNORMAL LOW (ref 65–139)
POTASSIUM: 3.5 mmol/L (ref 3.5–5.1)
SODIUM: 140 mmol/L (ref 136–145)

## 2013-11-24 LAB — H & H
HCT: 31.9 % — ABNORMAL LOW (ref 33.5–45.2)
HGB: 10.4 g/dL — ABNORMAL LOW (ref 11.2–15.2)

## 2013-11-24 LAB — PERFORM POC WHOLE BLOOD GLUCOSE: GLUCOSE, POINT OF CARE: 71 mg/dL (ref 70–105)

## 2013-11-24 MED ORDER — SULFAMETHOXAZOLE 800 MG-TRIMETHOPRIM 160 MG TABLET
1.00 | ORAL_TABLET | Freq: Two times a day (BID) | ORAL | Status: DC
Start: 2013-11-24 — End: 2013-11-26

## 2013-11-24 MED ORDER — HEPARIN LOCK FLUSH (PORCINE) 100 UNIT/ML INTRAVENOUS SOLUTION
INTRAVENOUS | Status: AC
Start: 2013-11-24 — End: 2013-11-24
  Administered 2013-11-24: 5 mL
  Filled 2013-11-24: qty 5

## 2013-11-24 MED ORDER — POLYETHYLENE GLYCOL 3350 17 GRAM ORAL POWDER PACKET
17.00 g | Freq: Every day | ORAL | Status: AC | PRN
Start: 2013-11-24 — End: 2013-12-22

## 2013-11-24 MED ORDER — AMLODIPINE 10 MG TABLET
10.0000 mg | ORAL_TABLET | Freq: Every day | ORAL | Status: AC
Start: 2013-11-24 — End: 2013-12-22

## 2013-11-24 MED ADMIN — lanolin-oxyquin-pet, hydrophil topical ointment: ORAL | @ 08:00:00

## 2013-11-24 NOTE — CDI REVIEW (Signed)
Admissions, Findings, and Consultations:  MEDICINE  2/3 - BRBPR, Abdominal pain   - Hemoccult negative at [facility]; CT abdomen/pelvis at outside facility demonstrating RLQ abdominal wall mass. CT on Image Grid  UTI - Urine culture from outside facility grew Achromobacter Xylosoxidans.   - Bactrim double strength tablet bid   CHF - Conservative fluid management       Diagnostics and Results:   Procedures and Treatments:  2/4 -  Stoltz[place]us  -   PROCEDURE: Core biopsy of abdominal mass  PROCEDURE: CT-guided core biopsy of abdominal wall mass     Meds, IV's, Rx Blood:   Comments:

## 2013-11-24 NOTE — Nurses Notes (Signed)
Patient awakened and given morning med.  Patient denies needs at this time.

## 2013-11-24 NOTE — Discharge Summary (Signed)
DISCHARGE SUMMARY      PATIENT NAME:  Christy Hale, Christy Hale  MRN:  [id number]  DOB:  May 18, 1941    ADMISSION DATE:  11/19/2013  DISCHARGE DATE:  11/24/13    ATTENDING PHYSICIAN: Loistine Simas MD  PRIMARY CARE PHYSICIAN: Timmie Foerster, DO     DISCHARGE DIAGNOSIS:   Principle Problem: BRBPR (bright red blood per rectum)  Active Hospital Problems    Diagnosis Date Noted    Principle Problem: BRBPR (bright red blood per rectum) 11/20/2013    Mass in the abdomen 11/20/2013    UTI (urinary tract infection) 11/20/2013    HTN (hypertension) 11/20/2013    AKI (acute kidney injury) 12/31/2010    Type 2 diabetes mellitus       Resolved Hospital Problems    Diagnosis    No resolved problems to display.     Active Non-Hospital Problems    Diagnosis Date Noted    Hypocalcemia 28/31/5176    Metabolic acidosis 16/04/3709    Hypomagnesemia 12/31/2010    Hx of total cystectomy 12/31/2010    Hx of unilateral nephrectomy 12/31/2010    Respiratory failure following trauma and surgery 12/31/2010    Bladder cancer 12/20/2010    Hyperlipidemia       DISCHARGE MEDICATIONS:  Discharge Medication List as of 11/24/2013  2:59 PM      START taking these medications    Details   amLODIPine (NORVASC) 10 mg Oral Tablet Take 1 Tab (10 mg total) by mouth Once a day for 28 days, Disp-28 Tab, R-0, Print      polyethylene glycol (MIRALAX) 17 gram Oral Powder in Packet Take 1 Packet (17 g total) by mouth Once per day as needed for up to 28 days, Disp-1 Packet, R-5, Print      trimethoprim-sulfamethoxazole (BACTRIM DS) 800-160 mg Oral Tablet Take 1 Tab (160 mg total) by mouth Every 12 hours for 4 days, Disp-8 Tab, R-0, Print         CONTINUE these medications which have NOT CHANGED    Details   citalopram (CELEXA) 40 mg Oral Tablet Take 40 mg by mouth Once a day, Historical Med      cloNIDine (CATAPRES-TTS) 0.1 mg/24 hr Transdermal Patch Weekly Historical Med      INSULIN GLARGINE,HUM.REC.ANLOG (LANTUS SUBQ) 50 Units by Subcutaneous route Every night  , Historical Med      INSULIN LISPRO (HUMALOG SUBQ) by Subcutaneous route Twice daily On a sliding scale  Takes 22 to 28 units on sliding scale, Historical Med      levothyroxine (SYNTHROID) 25 mcg Oral Tablet Take 25 mcg by mouth Once a day , Historical Med      metoclopramide HCl (REGLAN) 5 mg Oral Tablet 5 mg Three times daily before meals , Historical Med      omeprazole (PRILOSEC) 20 mg Oral Capsule, Delayed Release(E.C.) Take 20 mg by mouth Twice daily , Historical Med      promethazine (PHENERGAN) 25 mg Oral Tablet Take 25 mg by mouth Three times a day as needed, Historical Med      sennosides-docusate sodium (SENOKOT-S) 8.6-50 mg Oral Tablet take 1 Tab by mouth QPM., Disp-30 Tab, R-0, Print      simvastatin (ZOCOR) 40 mg Oral Tablet 40 mg Once a day , Historical Med      traMADol (ULTRAM) 50 mg Oral Tablet Three times a day as needed One tab at bedtime, Historical Med      Cholecalciferol, Vitamin D3, (VITAMIN D-3) 400 unit  Oral Capsule Take 1 Tab by mouth Once a day, Historical Med      ferrous sulfate (FERATAB) 324 mg (65 mg iron) Oral Tablet, Delayed Release (E.C.) Take 324 mg by mouth Every morning before breakfast, Historical Med      furosemide (LASIX) 20 mg Oral Tablet 20 mg Every other day , Historical Med      KLOR-CON M20 20 mEq Oral Tab Sust.Rel. Particle/Crystal 20 mEq Every 48 hours , Historical Med      NIACIN, INOSITOL NIACINATE, ORAL take 500 mg by mouth Once a day., Historical Med         STOP taking these medications       amoxicillin (AMOXIL) 500 mg Oral Capsule Comments:   Reason for Stopping:             DISCHARGE INSTRUCTIONS:  Follow-up Information    Follow up with Timmie Foerster, DO. Schedule an appointment as soon as possible for a visit in 1 week.    Specialty:  EXTERNAL    Contact information:    [address]  [place] [address]  [phone]          Follow up with Neurology Clinic, [facility] .    Specialty:  Neurology    Contact information:    [place] [address]  [phone]    Additional information:    For driving directions to the Neurology Clinic, located within the [facility], [place],  please call [phone] or you may visit our website at [email or web address]*Valet parking is  available to patients at [facility] outpatient clinics for free and tipping is not required.*Visitors to our main campus will Location manager as we are expanding to better serve you. We  apologize for any inconvenience this may cause and appreciate your patience.          DISCHARGE INSTRUCTION - MISC   - Please schedule an appointment with your PCP for next week  - We will call you with the pathology results of your biopsy  - If you experience any significant bleeding, get short of breath, or dizzy, please go to the nearest emergency room     [place]   Follow-up appointment clinic: NEUROLOGY    Follow-up in: [place]    Reason for visit: HOSPITAL DISCHARGE    Followup reason: Tremor         [place]:  This is a 73 y.o., female with PMH of CHF, HTN, TCC s/p left nephrectomy, cystectomy, hysterectomy who presented from [facility] for [employer]. She had been having constipation for the previous month and treating with constipation. She also had a right abdominal wall mass seen on CT at outside facility. This is uploaded on image grid. She was diagnosed with a UTI as well and started on Rocephin. She eventually grew out Achromobacter Xylosoxidans which was sensitive to bactrim and she was switched with an end date of 11/28/13. During her hospital stay, she had 2 episodes of BRBPR but no discernable drop in Hgb. GI took her for a colonoscopy on 11/22/13 which was normal with no signs of bleeding. Rectal and vaginal exams showed no overt bleeding as well. Her hemoglobin stayed stable throughout her stay. She had an IR biopsy of the right sided abdominal wall mass.  Biopsy results are still pending. She had an AKI during her stay, but on further evaluation, this was intrinsic  and along the baseline for her previous lab values. Her HTN was controlled with Norvasc 10 mg daily. She was seen to have a tremor during her stay that was present with intention and rest. She was scheduled a f/u with Neurology for this. She was stable for discharge at this time. She was told to follow up with her PCP within a week for evaluation of her Hgb. We told her we would call with biopsy results and schedule her appointment appropriately at that time.     CONDITION ON DISCHARGE:  A. Ambulation: Full ambulation  B. Self-care Ability: Complete  C. Cognitive Status Alert and Oriented x 3    DISCHARGE DISPOSITION:  Home discharge         Tarrie Mcmichen Janace Aris, MD 11/26/2013, 22:18    Malory Spurr Janace Aris, MD        Copies sent to Care Team      Relationship Specialty Notifications Start End    Timmie Foerster, DO PCP - General EXTERNAL  04/08/12     Phone: [phone] Fax: [phone]         [address] [place] [address]          Referring providers can utilize [email or web address] to access their referred [employer] patient's information.

## 2013-11-24 NOTE — Nurses Notes (Signed)
Patient given AVS and reviewed.She verbalized all instructions including follow-up with her PCP in one week,she will make this appointment.She also has a follow-up with neurology in 3 weeks.She verbalized understanding of medications,restrictions and reasons she would go to the nearest ED.Her port was de- accessed by her nurse.She will be D/C by wheelchair when her family gets here.

## 2013-11-24 NOTE — Nurses Notes (Signed)
Patient slept through night without complaints. No distress noted.  Patient acknowledged some abd tenderness but declined need for medications.  IVF (NSS) cont to infuse at 100cc/hr via port as ordered.  Urostomy intact and draining clear yellow urine.

## 2013-11-24 NOTE — Care Management Notes (Signed)
[facility]  Care Management Note    Patient Name: Christy Hale  Date of Birth: Oct 23, 1940  Sex: female  Date/Time of Admission: 11/19/2013  2:27 AM  Room/Bed: 807/A  Payor: HUMANA MEDICARE / Plan: HU[employer]NA CHOICE PPO / Product Type: PPO /    LOS: 5 days   PCP: Timmie Foerster, DO    Admitting Diagnosis:  GI bleed  Rectal bleeding    Assessment:    11/24/13 1507   Assessment Details   Assessment Type Continued Assessment   Date of Care Management Update 11/24/13   Date of Next DCP Update 11/25/13   Care Management Plan   Discharge Planning Status plan in progress   Discharge Needs Assessment   Discharge Facility/Level of Care Needs Home (Patient/Family Member/other)(code 1)   Pt seen and evaluated by PT yesterday.  She ambulated well and PT recommends d/c to home at time of d/c.  They do not feel she needs ongoing PT post d/c.  Anticipate d/c to home at time of d/c.  Will follow.    Discharge Plan:  Home(Patient/Family Member/other) (code 1)      The patient will continue to be evaluated for developing discharge needs.     Case Manager: [place], SW 11/24/2013, 15:08  Phone: [phone]

## 2013-11-24 NOTE — Progress Notes (Addendum)
[facility]  Medicine Progress Note    Christy Hale  Date of service: 11/24/2013  Date of Admission:  11/19/2013    Hospital Day:  LOS: 5 days     Subjective:  - No bleeding episodes  - Tolerating diet without difficulty  - No pain at incision site  - Remarks that tremor is present with movement and at rest. She has learned to ignore it.     Vital Signs:  Temp  Avg: 36.6 C (97.8 F)  Min: 36.3 C (97.3 F)  Max: 37 C (98.6 F)    Pulse  Avg: 81.3  Min: 66  Max: 153 BP  Min: 129/72  Max: 171/72   Resp  Avg: 17.1  Min: 14  Max: 18 SpO2  Avg: 94.8 %  Min: 91 %  Max: 97 %   Pain Score (Numeric, Faces): 0      Input/Output    Intake/Output Summary (Last 24 hours) at 11/24/13 0833  Last data filed at 11/24/13 0700   Gross per 24 hour   Intake   3018 ml   Output   3275 ml   Net   -257 ml    I/O last shift:          Current Facility-Administered Medications:  acetaminophen (TYLENOL) tablet 650 mg Oral Q4H PRN   amLODIPine (NORVASC) tablet 10 mg Oral Daily   citalopram (CELEXA) tablet 40 mg Oral Daily   cloNIDine (CATAPRES-TTS) transdermal patch (mg/24 hr) 0.1 mg Transdermal Q7 Days   insulin glargine (LANTUS) 100 units/mL injection 30 Units Subcutaneous 2x/day   levothyroxine (SYNTHROID) tablet 25 mcg Oral QAM   metoclopramide (REGLAN) tablet 5 mg Oral 3x/day AC   morphine 2 mg/mL injection 2 mg Intravenous Q4H PRN   NS flush syringe 2 mL Intracatheter Q8HRS   And      NS flush syringe 2-6 mL Intracatheter Q1 MIN PRN   NS premix infusion  Intravenous Continuous   ondansetron (ZOFRAN) 2 mg/mL injection 4 mg Intravenous Q8H PRN   pantoprazole (PROTONIX) delayed release tablet 20 mg Oral 2x/day AC   polyethylene glycol (MIRALAX) oral packet 17 g Oral Daily PRN   promethazine (PHENERGAN) tablet 25 mg Oral 3x/day PRN   sennosides-docusate sodium (SENOKOT-S) 8.6-50mg  per tablet 1 Tab Oral 2x/day   simvastatin (ZOCOR) tablet 40 mg Oral QPM   SSIP insulin lispro (HUMALOG) 100 units/mL injection 3-9 Units  Subcutaneous 4x/day PRN   traMADol (ULTRAM) tablet 50 mg Oral 3x/day PRN   trimethoprim-sulfamethoxazole (BACTRIM DS) 160-800mg  per tablet 1 Tab Oral Q12H       Physical Exam:  General: very pleasant, no discomfort   Eyes: Conjunctiva clear.  HENT:Head atraumatic and normocephalic, Mouth mucous membranes moist  Neck: no adenopathy  Lungs: Clear to auscultation bilaterally.   Cardiovascular: regular rate and rhythm, S1, S2 normal, no murmur, click, rub or gallop  Abdomen: Abdomen soft, non-distended, non-tender.   Extremities: No cyanosis or edema  Skin: Skin warm and dry  Neurologic: Tremor with finger to nose testing and at rest.   Psychiatric: Affect Normal      Labs:      CBC:  Recent Labs      11/22/13   0442  11/23/13   0450  11/24/13   0513   WBC  12.3*  11.0   --    HGB  10.9*  11.6  10.4*   HCT  32.1*  34.2  31.9*   PLTCNT  285  274   --      Differential:   Recent Labs      11/22/13   0442  11/23/13   0450   PMNS  68  73   LYMPHOCYTES  23  18   MONOCYTES  7  7   EOSINOPHIL  1  1   BASOPHILS  1  1   PMNABS  8.428*  8.104*   LYMPHSABS  2.803  2.018   MONOSABS  0.870  0.717   EOSABS  0.124  0.097   BASOSABS  0.122  0.062     BMP:  Recent Labs      11/22/13   0442  11/23/13   0450  11/24/13   0513   SODIUM  140  142  140   POTASSIUM  3.9  4.0  3.5   CHLORIDE  105  108  110   CO2  23  22  21*   BUN  15  13  11    CREATININE  1.32*  1.39*  1.34*   GLUCOSENF  143*  82  59*   ANIONGAP  12  12  9    GFR  42*  39*  41*     Recent Labs      11/22/13   0442  11/23/13   0450  11/24/13   0513   CALCIUM  9.2  9.0  8.6   MAGNESIUM   --   1.5*   --    PHOSPHORUS   --   3.8   --      RBC Indices:  Recent Labs      11/22/13   0442  11/23/13   0450   MCV  84.2  84.8   MCH  28.7  28.7   MCHC  34.0  33.8   RDW  14.1  14.4   MPV  8.1  7.9     Coagulation Studies:  No results found for this basename: PTT, PT, INR, PROTHROMTME, APTT, CREACTPROTIE,  in the last 72 hours  Radiology:           PT/OT: No    Consults: GI     Hardware  (lines, foley's, tubes): Implantable port on left, PIV L. Cephalic, Urostomy on right     Assessment/ Plan:   Active Hospital Problems    Diagnosis    Primary Problem: BRBPR (bright red blood per rectum)    Mass in the abdomen    UTI (urinary tract infection)    HTN (hypertension)    AKI (acute kidney injury)    Type 2 diabetes mellitus     Summary: 73 yo female w/PMH of CHF, HTN, DM, seizures, TCC s/p left nephrectomy, cystectomy, hysterectomy, and lung cancer s/p LLL resection transferred from outside facility for BRBPR and abdominal mass on CT.    BRBPR, Abdominal pain  - Hemoccult negative at [facility]; CT abdomen/pelvis at outside facility demonstrating RLQ abdominal wall mass. CT on Image Grid  - Hgb 10.4, Hct 31.9, stable  - Will have patient f/u with PCP for further hemoglobin monitoring     R. 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   - Bactrim double strength tablet bid (11/28/13)  HTN  - Continuing Clonidine transdermal patch 0.1 mg changed every Sunday  - Norvasc to 10 mg daily     DM I  - Basal dosing of Lantus 30 units bid; Monitoring to see if basal rate needs increased   - SSI protocol     Deconditioning  - PT with no recommendations for discharge    Tremor  - Pin-rolling tremor with cogwheel rigidity elicited on exam today.   - Patient has not had this evaluated before. Will scheduled outpatient Neurology follow up for evaluation.     Hypothyroidism  - Continuing synthroid 25 mcg qam     CHF  - Conservative fluid management  - Holding Lasix at time for AKI     Depression  - Celexa 40 mg daily     HLD  - Zocor 40 mg qpm     DVT/PE Prophylaxis: Not indicated    Disposition Planning: Home discharge      Masih Janace Aris, MD 11/24/2013,  08:33              I saw and examined the patient.  I reviewed the resident's note.  I agree with the findings and plan of care as documented in the resident's note.  Any exceptions/additions are edited/noted.    Loistine Simas, MD 11/24/2013, 20:53

## 2013-11-25 LAB — ROUTINE STOOL CULTURE (INCLUDING E. COLI SHIGA TOXIN)

## 2013-11-26 ENCOUNTER — Inpatient Hospital Stay (HOSPITAL_COMMUNITY): Payer: Commercial Managed Care - PPO

## 2013-11-26 ENCOUNTER — Encounter (HOSPITAL_COMMUNITY): Payer: Self-pay | Admitting: NURSE PRACTITIONER

## 2013-11-26 ENCOUNTER — Inpatient Hospital Stay (HOSPITAL_COMMUNITY): Payer: Commercial Managed Care - PPO | Admitting: Specialist

## 2013-11-26 ENCOUNTER — Inpatient Hospital Stay (HOSPITAL_BASED_OUTPATIENT_CLINIC_OR_DEPARTMENT_OTHER)
Admission: EM | Admit: 2013-11-26 | Discharge: 2013-11-26 | Disposition: A | Discharge status: Home / Self Care | Payer: Commercial Managed Care - PPO

## 2013-11-26 ENCOUNTER — Inpatient Hospital Stay (HOSPITAL_COMMUNITY)
Admission: AD | Admit: 2013-11-26 | Discharge: 2013-11-28 | Disposition: A | Discharge status: Home / Self Care | Payer: Commercial Managed Care - PPO | Source: Other Acute Inpatient Hospital | Attending: Specialist | Admitting: Specialist

## 2013-11-26 DIAGNOSIS — Z85118 Personal history of other malignant neoplasm of bronchus and lung: Secondary | ICD-10-CM

## 2013-11-26 DIAGNOSIS — K529 Noninfective gastroenteritis and colitis, unspecified: Secondary | ICD-10-CM | POA: Diagnosis present

## 2013-11-26 DIAGNOSIS — Z794 Long term (current) use of insulin: Secondary | ICD-10-CM

## 2013-11-26 DIAGNOSIS — R109 Unspecified abdominal pain: Secondary | ICD-10-CM

## 2013-11-26 DIAGNOSIS — N179 Acute kidney failure, unspecified: Secondary | ICD-10-CM | POA: Diagnosis present

## 2013-11-26 DIAGNOSIS — F3289 Other specified depressive episodes: Secondary | ICD-10-CM | POA: Diagnosis present

## 2013-11-26 DIAGNOSIS — E46 Unspecified protein-calorie malnutrition: Secondary | ICD-10-CM | POA: Diagnosis present

## 2013-11-26 DIAGNOSIS — Z902 Acquired absence of lung [part of]: Secondary | ICD-10-CM

## 2013-11-26 DIAGNOSIS — R1900 Intra-abdominal and pelvic swelling, mass and lump, unspecified site: Secondary | ICD-10-CM | POA: Diagnosis present

## 2013-11-26 DIAGNOSIS — Z8551 Personal history of malignant neoplasm of bladder: Secondary | ICD-10-CM

## 2013-11-26 DIAGNOSIS — R259 Unspecified abnormal involuntary movements: Secondary | ICD-10-CM | POA: Diagnosis present

## 2013-11-26 DIAGNOSIS — Z905 Acquired absence of kidney: Secondary | ICD-10-CM

## 2013-11-26 DIAGNOSIS — E119 Type 2 diabetes mellitus without complications: Secondary | ICD-10-CM | POA: Diagnosis present

## 2013-11-26 DIAGNOSIS — E039 Hypothyroidism, unspecified: Secondary | ICD-10-CM | POA: Diagnosis present

## 2013-11-26 DIAGNOSIS — F329 Major depressive disorder, single episode, unspecified: Secondary | ICD-10-CM | POA: Diagnosis present

## 2013-11-26 DIAGNOSIS — Z87891 Personal history of nicotine dependence: Secondary | ICD-10-CM

## 2013-11-26 DIAGNOSIS — E860 Dehydration: Secondary | ICD-10-CM

## 2013-11-26 DIAGNOSIS — Z8553 Personal history of malignant neoplasm of renal pelvis: Secondary | ICD-10-CM

## 2013-11-26 DIAGNOSIS — Z906 Acquired absence of other parts of urinary tract: Secondary | ICD-10-CM

## 2013-11-26 DIAGNOSIS — E785 Hyperlipidemia, unspecified: Secondary | ICD-10-CM | POA: Diagnosis present

## 2013-11-26 DIAGNOSIS — Z6838 Body mass index (BMI) 38.0-38.9, adult: Secondary | ICD-10-CM

## 2013-11-26 DIAGNOSIS — I509 Heart failure, unspecified: Secondary | ICD-10-CM | POA: Diagnosis present

## 2013-11-26 DIAGNOSIS — N390 Urinary tract infection, site not specified: Secondary | ICD-10-CM | POA: Diagnosis present

## 2013-11-26 DIAGNOSIS — R0989 Other specified symptoms and signs involving the circulatory and respiratory systems: Secondary | ICD-10-CM

## 2013-11-26 DIAGNOSIS — K219 Gastro-esophageal reflux disease without esophagitis: Secondary | ICD-10-CM | POA: Diagnosis present

## 2013-11-26 DIAGNOSIS — I10 Essential (primary) hypertension: Secondary | ICD-10-CM | POA: Diagnosis present

## 2013-11-26 DIAGNOSIS — R1110 Vomiting, unspecified: Secondary | ICD-10-CM

## 2013-11-26 DIAGNOSIS — R0609 Other forms of dyspnea: Secondary | ICD-10-CM

## 2013-11-26 HISTORY — DX: Heart failure, unspecified (CMS HCC): I50.9

## 2013-11-26 HISTORY — DX: Disorder of thyroid, unspecified: E07.9

## 2013-11-26 LAB — CBC/DIFF
BASOPHILS: 0 %
BASOS ABS: 0.031 THOU/uL (ref 0.000–0.200)
EOS ABS: 0.013 THOU/uL (ref 0.000–0.500)
EOSINOPHIL: 0 %
HCT: 37 % (ref 33.5–45.2)
HGB: 12.4 g/dL (ref 11.2–15.2)
LYMPHOCYTES: 3 %
LYMPHS ABS: 0.467 10*3/uL — ABNORMAL LOW (ref 1.000–4.800)
MCH: 28.5 pg (ref 27.4–33.0)
MCHC: 33.5 g/dL (ref 32.5–35.8)
MCV: 85.2 fL (ref 78–100)
MONOCYTES: 1 %
MONOS ABS: 0.227 THOU/uL — ABNORMAL LOW (ref 0.300–1.000)
MPV: 8.2 fL (ref 7.5–11.5)
PLATELET COUNT: 284 THOU/uL (ref 140–450)
PMN ABS: 15.132 10*3/uL — ABNORMAL HIGH (ref 1.500–7.700)
PMN'S: 96 %
RBC: 4.34 MIL/uL (ref 3.63–4.92)
RDW: 15.2 % — ABNORMAL HIGH (ref 12.0–15.0)
RDW: 15.2 % — ABNORMAL HIGH (ref 12.0–15.0)
WBC: 15.9 10*3/uL — ABNORMAL HIGH (ref 3.5–11.0)

## 2013-11-26 LAB — URINALYSIS WITH MICROSCOPIC REFLEX IF INDICATED
BILIRUBIN: NEGATIVE
KETONES: 10 mg/dL — AB
LEUKOCYTES: NEGATIVE
PROTEIN: 30 mg/dL — AB
UROBILINOGEN: NORMAL mg/dL

## 2013-11-26 LAB — VENOUS BLOOD GAS
%FIO2: 21 %
%FIO2: 21 %
BASE DEFICIT: 3.4 mmol/L — ABNORMAL HIGH (ref 0.0–2.0)
BICARBONATE: 22 mmol/L (ref 22.0–26.0)
PCO2: 32 mmHg — ABNORMAL LOW (ref 41.0–51.0)
PO2: 52 mmHg — ABNORMAL HIGH (ref 35–50)

## 2013-11-26 LAB — URINALYSIS, MICROSCOPIC
RBC'S: 1 /HPF (ref 0–4)
WBC'S: 3 /HPF (ref 0–6)

## 2013-11-26 LAB — BASIC METABOLIC PANEL
ANION GAP: 16 mmol/L — ABNORMAL HIGH (ref 4–13)
BUN/CREAT RATIO: 11 (ref 6–22)
BUN: 19 mg/dL (ref 8–25)
CALCIUM: 9.6 mg/dL (ref 8.5–10.4)
CARBON DIOXIDE: 17 mmol/L — ABNORMAL LOW (ref 22–32)
CHLORIDE: 106 mmol/L (ref 96–111)
CREATININE: 1.75 mg/dL — ABNORMAL HIGH (ref 0.49–1.10)
ESTIMATED GLOMERULAR FILTRATION RATE: 30 ml/min/1.73m2 — ABNORMAL LOW (ref >59)
GLUCOSE,NONFAST: 334 mg/dL — ABNORMAL HIGH (ref 65–139)
POTASSIUM: 5 mmol/L (ref 3.5–5.1)
SODIUM: 139 mmol/L (ref 136–145)

## 2013-11-26 LAB — HEPATIC FUNCTION PANEL
ALBUMIN: 3.2 g/dL — ABNORMAL LOW (ref 3.4–4.8)
ALKALINE PHOSPHATASE: 126 U/L (ref <150)
ALT (SGPT): 22 U/L (ref <55)
AST (SGOT): 23 U/L (ref 8–41)
BILIRUBIN, TOTAL: 0.3 mg/dL (ref 0.3–1.3)
BILIRUBIN,CONJUGATED: <0.2 mg/dL (ref <0.3)
TOTAL PROTEIN: 8.1 g/dL — ABNORMAL HIGH (ref 6.0–8.0)

## 2013-11-26 LAB — URINALYSIS MACROSCOPIC WITH REFLEX TO MICROSCOPIC URINALYSIS (CULTURE NOT PERFORMED)
GLUCOSE: 300 mg/dL — AB
SPECIFIC GRAVITY, URINE: 1.009 (ref 1.005–1.030)

## 2013-11-26 LAB — PERFORM POC WHOLE BLOOD GLUCOSE: GLUCOSE, POINT OF CARE: 322 mg/dL — ABNORMAL HIGH (ref 70–105)

## 2013-11-26 LAB — LACTIC ACID LEVEL: LACTIC ACID: 1.2 mmol/L (ref 0.5–2.2)

## 2013-11-26 LAB — LIPASE: LIPASE: <5 U/L — ABNORMAL LOW (ref 10–80)

## 2013-11-26 LAB — CREATININE URINE, RANDOM: CREATININE, UR RAND: 53 mg/dL — AB

## 2013-11-26 LAB — PHOSPHORUS: PHOSPHORUS: 2.8 mg/dL (ref 2.3–4.0)

## 2013-11-26 LAB — MAGNESIUM: MAGNESIUM: 1.4 mg/dL — ABNORMAL LOW (ref 1.6–2.5)

## 2013-11-26 LAB — SODIUM, RANDOM URINE: SODIUM, URINE RAND: 88 mmol/L — AB

## 2013-11-26 MED ORDER — HEPARIN (PORCINE) 5,000 UNIT/ML INJECTION SOLUTION
5000.0000 [IU] | Freq: Three times a day (TID) | INTRAMUSCULAR | Status: DC
Start: 2013-11-27 — End: 2013-11-28
  Administered 2013-11-27 – 2013-11-28 (×4): 5000 [IU] via SUBCUTANEOUS
  Filled 2013-11-26 (×7): qty 1

## 2013-11-26 MED ORDER — HEPARIN LOCK FLUSH (PORCINE) 100 UNIT/ML INTRAVENOUS SOLUTION
5.0000 mL | INTRAVENOUS | Status: DC | PRN
Start: 2013-11-26 — End: 2013-11-28

## 2013-11-26 MED ORDER — TRAMADOL 50 MG TABLET
50.00 mg | ORAL_TABLET | Freq: Every day | ORAL | Status: DC | PRN
Start: 2013-11-26 — End: 2013-11-28
  Administered 2013-11-26: 50 mg via ORAL
  Filled 2013-11-26: qty 1

## 2013-11-26 MED ORDER — AMLODIPINE 10 MG TABLET
10.0000 mg | ORAL_TABLET | Freq: Every day | ORAL | Status: DC
Start: 2013-11-27 — End: 2013-11-28
  Administered 2013-11-27 – 2013-11-28 (×2): 10 mg via ORAL
  Filled 2013-11-26 (×2): qty 1

## 2013-11-26 MED ORDER — CITALOPRAM 40 MG TABLET
40.0000 mg | ORAL_TABLET | Freq: Every day | ORAL | Status: DC
Start: 2013-11-27 — End: 2013-11-28
  Administered 2013-11-27 – 2013-11-28 (×2): 40 mg via ORAL
  Filled 2013-11-26 (×2): qty 1

## 2013-11-26 MED ORDER — PANTOPRAZOLE 20 MG TABLET,DELAYED RELEASE
20.00 mg | DELAYED_RELEASE_TABLET | Freq: Two times a day (BID) | ORAL | Status: DC
Start: 2013-11-27 — End: 2013-11-27
  Administered 2013-11-27: 20 mg via ORAL
  Filled 2013-11-26 (×2): qty 1

## 2013-11-26 MED ORDER — SIMVASTATIN 20 MG TABLET
40.00 mg | ORAL_TABLET | Freq: Every day | ORAL | Status: DC
Start: 2013-11-27 — End: 2013-11-28
  Administered 2013-11-27 – 2013-11-28 (×2): 40 mg via ORAL
  Filled 2013-11-26 (×3): qty 2

## 2013-11-26 MED ORDER — SODIUM CHLORIDE 0.9 % INTRAVENOUS SOLUTION
INTRAVENOUS | Status: DC
Start: 2013-11-26 — End: 2013-11-27

## 2013-11-26 MED ORDER — INSULIN GLARGINE (U-100) 100 UNIT/ML SUBCUTANEOUS SOLUTION
10.0000 [IU] | Freq: Once | SUBCUTANEOUS | Status: AC
Start: 2013-11-27 — End: 2013-11-27
  Administered 2013-11-27: 10 [IU] via SUBCUTANEOUS
  Filled 2013-11-26: qty 10

## 2013-11-26 MED ORDER — ONDANSETRON HCL (PF) 4 MG/2 ML INJECTION SOLUTION
INTRAMUSCULAR | Status: AC
Start: 2013-11-26 — End: 2013-11-26
  Filled 2013-11-26: qty 2

## 2013-11-26 MED ORDER — CEFTAZIDIME 1 GRAM SOLUTION FOR INJECTION
1.00 g | INTRAMUSCULAR | Status: DC
Start: 2013-11-28 — End: 2013-11-28
  Administered 2013-11-27: 1 g via INTRAVENOUS
  Filled 2013-11-26 (×2): qty 10

## 2013-11-26 MED ORDER — CEFTAZIDIME 1 GRAM SOLUTION FOR INJECTION
1.0000 g | Freq: Once | INTRAMUSCULAR | Status: AC
Start: 2013-11-27 — End: 2013-11-27
  Administered 2013-11-27 (×2): 1 g via INTRAVENOUS
  Filled 2013-11-26: qty 10

## 2013-11-26 MED ORDER — VANCOMYCIN IV - PHARMACIST TO DOSE PER PROTOCOL
Freq: Every day | Status: DC | PRN
Start: 2013-11-26 — End: 2013-11-28

## 2013-11-26 MED ORDER — CLONIDINE 0.1 MG/24 HR WEEKLY TRANSDERMAL PATCH
0.10 mg | MEDICATED_PATCH | TRANSDERMAL | Status: DC
Start: 2013-11-27 — End: 2013-11-28
  Administered 2013-11-27 (×2): 0.1 mg via TRANSDERMAL
  Filled 2013-11-26: qty 1

## 2013-11-26 MED ORDER — PROMETHAZINE 25 MG TABLET
25.00 mg | ORAL_TABLET | Freq: Four times a day (QID) | ORAL | Status: DC | PRN
Start: 2013-11-26 — End: 2013-11-27

## 2013-11-26 MED ORDER — LEVOTHYROXINE 25 MCG TABLET
25.00 ug | ORAL_TABLET | Freq: Every morning | ORAL | Status: DC
Start: 2013-11-27 — End: 2013-11-28
  Administered 2013-11-27 – 2013-11-28 (×2): 25 ug via ORAL
  Filled 2013-11-26 (×3): qty 1

## 2013-11-26 MED ORDER — INSULIN LISPRO 100 UNIT/ML SUB-Q SSIP
2.00 [IU] | INJECTION | Freq: Four times a day (QID) | SUBCUTANEOUS | Status: DC | PRN
Start: 2013-11-26 — End: 2013-11-28
  Administered 2013-11-26: 6 [IU] via SUBCUTANEOUS
  Administered 2013-11-27: 2 [IU] via SUBCUTANEOUS
  Administered 2013-11-27: 6 [IU] via SUBCUTANEOUS
  Administered 2013-11-27 – 2013-11-28 (×2): 4 [IU] via SUBCUTANEOUS
  Filled 2013-11-26: qty 3

## 2013-11-26 MED ORDER — PROMETHAZINE 25 MG/ML INJECTION SOLUTION
25.0000 mg | Freq: Four times a day (QID) | INTRAMUSCULAR | Status: DC | PRN
Start: 2013-11-26 — End: 2013-11-27
  Administered 2013-11-26: 25 mg via INTRAMUSCULAR
  Filled 2013-11-26 (×4): qty 1

## 2013-11-26 MED ORDER — SODIUM CHLORIDE 0.9 % (FLUSH) INJECTION SYRINGE
10.0000 mL | INJECTION | Freq: Three times a day (TID) | INTRAMUSCULAR | Status: DC
Start: 2013-11-26 — End: 2013-11-28
  Administered 2013-11-26: 10 mL via INTRAVENOUS
  Administered 2013-11-27 – 2013-11-28 (×5): 0 mL via INTRAVENOUS

## 2013-11-26 MED ORDER — DEXTROSE 5 % IN WATER (D5W) INTRAVENOUS SOLUTION
1.0000 g | INTRAVENOUS | Status: DC
Start: 2013-11-28 — End: 2013-11-26

## 2013-11-26 MED ORDER — HEPARIN LOCK FLUSH (PORCINE) 100 UNIT/ML INTRAVENOUS SOLUTION
5.0000 mL | Freq: Three times a day (TID) | INTRAVENOUS | Status: DC
Start: 2013-11-27 — End: 2013-11-28
  Administered 2013-11-27 – 2013-11-28 (×5): 0

## 2013-11-26 MED ORDER — HEPARIN LOCK FLUSH (PORCINE) 100 UNIT/ML INTRAVENOUS SOLUTION
5.0000 mL | Freq: Every day | INTRAVENOUS | Status: DC | PRN
Start: 2013-11-26 — End: 2013-11-28

## 2013-11-26 MED ORDER — VANCOMYCIN 1 GRAM/200 ML IN DEXTROSE 5 % INTRAVENOUS PIGGYBACK
15.0000 mg/kg | INJECTION | Freq: Once | INTRAVENOUS | Status: DC
Start: 2013-11-27 — End: 2013-11-26

## 2013-11-26 MED ORDER — SODIUM CHLORIDE 0.9 % (FLUSH) INJECTION SYRINGE
20.0000 mL | INJECTION | INTRAMUSCULAR | Status: DC | PRN
Start: 2013-11-26 — End: 2013-11-28

## 2013-11-26 MED ORDER — ONDANSETRON HCL (PF) 4 MG/2 ML INJECTION SOLUTION
4.00 mg | Freq: Three times a day (TID) | INTRAMUSCULAR | Status: DC | PRN
Start: 2013-11-26 — End: 2013-11-28
  Administered 2013-11-26 – 2013-11-27 (×2): 4 mg via INTRAVENOUS
  Filled 2013-11-26: qty 2

## 2013-11-26 MED ORDER — VANCOMYCIN 1 GRAM/200 ML IN DEXTROSE 5 % INTRAVENOUS PIGGYBACK
15.00 mg/kg | INJECTION | INTRAVENOUS | Status: DC
Start: 2013-11-27 — End: 2013-11-28
  Administered 2013-11-27 – 2013-11-28 (×2): 1000 mg via INTRAVENOUS
  Filled 2013-11-26 (×3): qty 200

## 2013-11-26 MED ADMIN — ciprofloxacin 500 mg tablet: INTRAVENOUS | @ 23:00:00

## 2013-11-26 NOTE — Nurses Notes (Signed)
Patient arrived to [facility] via EMS. Patient continues to have abdominal pain and N/V. Hospital and unit orientation packets provided. Bed and call light system explained, pt verbalized understanding. Service in to see patient. Will continue to monitor.

## 2013-11-26 NOTE — Care Management Notes (Signed)
MARS INTAKE      Referring Provider and Contact Number: Dr Chilton Greathouse [phone]  Transfer Source and Contact Number: [facility] [phone]  Transfer Emergent:  No    Date Admitted: today  Diagnosis: nausea vomiting abdominal pain  Type of bed pt currently in at sending facility: emergency room   PMH: diabetes, HTN, hx bladder ca, AKI, abdominal malignancy, ileostomy     Dialysis: no    Cancer: yes      Isolation: no     Is patient established with family practice/attending? no  Reason for transfer: tertiary care, patient request    Patient story/clinical presentation:Patient presented to [facility] ED today with nausea vomiting and abdominal pain.  Patient was recently discharged from [facility] and is requesting transfer back here. Last hospital stay it is reported that patient had a biopsy performed on the mass in her abdomen.  Patient is reported stable for transfer.    Current vital signs:  HR:  67      BP: 126/63      Resp:  10  Temp:    Afebrile    Sats:   97   O2: RA  Labs:    WBC (3.6-11) 17.5   HGB (13.1-17.3)    Hct (39.8-50.2)    PLC (140-400)    ABG ph    ABG PCO2    ABG PO2    ABG HCO3    Base def/exc    LP Glucose    LP Neutrophil    LP Protein    LP WBC    LP Other    PT    PTT    INR    CPK    Trop I    AST    ALT    Na (135-    K+ (3.5)    CL (96-1)    CO2 (23-35) 18   BUN (8-26)    Cr (0.62-1.27) 1.9   Glucose 289     Radiology (pelase place images on image grid) CT scan no bowel obstruction    EKG: NSR    Medications, IVF, Drips: IVF    Oxygen/Bipap/Vent settings: RA    Pt class and accommodation: inpatient floor status   Service and accepting MD: medicine Dr Ivonne Andrew

## 2013-11-26 NOTE — Nurses Notes (Signed)
Fingerstick of 56 noted. MD notified. Orange juice and grahm crackers given. Dinner tray in room. Patient is asymptomatic at this time. Will monitor.

## 2013-11-26 NOTE — H&P (Addendum)
[facility]   General Medicine  Admission H&P    Date of Service:  11/26/2013  Christy Hale, Christy Hale, 73 y.o. 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 She stated that a family member of hers has also been having some similar Sxs. Of note she was hospitalized here recently and is currently on Abx. Denies associated fever, chills, night sweats, constipation, rash, weight changes. She also reports LUE swelling that started today but no associated tenderness or redness. She denies having any recent IV placed in that arm.     ROS + for general weakness and DOE. ROS neg for chest pain, orthopnea, PND, frequency, urgency, dysuria, hematuria    Note: During last admission, presented with BRBPR, constipation, N/V. She initially went to [facility] where they obtained a CT abdomen/pelvis that showed right ureter & renal collecting system distension, right abdominal wall mass, and RLL nodule. She was also found to have mild anemia, hematuria, UTI, AKI, and occult stool test heme positive. She was then transferred to [facility] for further management where she p/w mild RLQ pain that radiates to the back.     Past medical History    Past Medical History   Diagnosis Date    Hypertension     Ulcer disease     Lung cancer      left lung, unknown type    Arthritis     Diabetes mellitus     Hyperlipidemia     History of transfusion      no reaction    GERD  (gastroesophageal reflux disease)      hx of, no longer takes meds    Kidney disease      s/p nephrectomy due to cancer    cancer      s/p renal cancer, lung cancer, skin cancer, and now with bladder cancer    Shortness of breath      with humidity    Seizures      x 3 2012 none since then    Muscle weakness     Urinary tract infection     Type 2 diabetes mellitus      dx 20 + yrs ago, fasting up to 311 low during night    Morbid obesity     H/O carcinoma of bladder     Past Surgical History   Procedure Laterality Date    Hx hysterectomy      Hx hand surgery       bilateral    Hx foot surgery       right    Hx other       left arm surgery    Hx nephrectomy       with ureterectomy (left) left nephrectomy    Hx other       left lung cancer removal    Hx laser eye surgery       laser OU  for DM    Hx cataract removal  06/10/2012     CE IOL OD w/avastin    Hx cataract removal Left 07/13/12     CE IOL OS    Hx cystectomy  12/31/2010     radical cystectomy         Home Medications  Medications Prior to Admission    Outpatient Medications    amLODIPine (NORVASC) 10 mg Oral Tablet    Take 1 Tab (10 mg total) by mouth Once a day for 28 days    Cholecalciferol, Vitamin D3, (VITAMIN D-3) 400 unit Oral Capsule    Take 1 Tab by mouth Once a day    citalopram (CELEXA) 40 mg Oral Tablet    Take 40 mg by mouth Once a day    cloNIDine (CATAPRES-TTS) 0.1 mg/24 hr Transdermal Patch Weekly    ferrous sulfate (FERATAB) 324 mg (65 mg iron) Oral Tablet, Delayed Release (E.C.)    Take 324 mg by mouth Every morning before breakfast    furosemide (LASIX) 20 mg Oral Tablet    20 mg Every other day     INSULIN GLARGINE,HUM.REC.ANLOG (LANTUS SUBQ)    50 Units by Subcutaneous route Every night     INSULIN LISPRO (HUMALOG SUBQ)    by Subcutaneous route Twice daily On a sliding scale  Takes 22 to 28 units on sliding scale    KLOR-CON M20 20 mEq Oral Tab Sust.Rel. Particle/Crystal    20 mEq Every 48 hours     levothyroxine  (SYNTHROID) 25 mcg Oral Tablet    Take 25 mcg by mouth Once a day     metoclopramide HCl (REGLAN) 5 mg Oral Tablet    5 mg Three times daily before meals     NIACIN, INOSITOL NIACINATE, ORAL    take 500 mg by mouth Once a day.    omeprazole (PRILOSEC) 20 mg Oral Capsule, Delayed Release(E.C.)    Take 20 mg by mouth Twice daily     polyethylene glycol (MIRALAX) 17 gram Oral Powder in Packet    Take 1 Packet (17 g total) by mouth Once per day as needed for up to 28 days    promethazine (PHENERGAN) 25 mg Oral Tablet    Take 25 mg by mouth Three times a day as needed    sennosides-docusate sodium (SENOKOT-S) 8.6-50 mg Oral Tablet    take 1 Tab by mouth QPM.    simvastatin (ZOCOR) 40 mg Oral Tablet    40 mg Once a day     traMADol (ULTRAM) 50 mg Oral Tablet    Three times a day as needed One tab at bedtime    trimethoprim-sulfamethoxazole (BACTRIM DS) 800-160 mg Oral Tablet    Take 1 Tab (160 mg total) by mouth Every 12 hours for 4 days           Allergies   Allergies   Allergen Reactions    Glucophage [Metformin]        Social History   History     Social History    Marital Status: Widowed     Spouse Name: N/A     Number of Children: N/A    Years of Education: N/A     Occupational History    Homemaker      Social History Main Topics    Smoking status: Former Smoker -- 3.00 packs/day for 15 years     Types: Cigarettes     Quit date: 10/20/1981  Smokeless tobacco: Never Used    Alcohol Use: Yes      Comment: wine rarely    Drug Use: No    Sexual Activity: Not on file     Other Topics Concern    Ability To Walk 1 Flight Of Steps Without Sob/Cp No     Gets SOB with stairs no cp    Routine Exercise No    Ability To Do Own Adl's Yes     has help with household chores, minimal daily activity, denies cp/sob    Other Activity Level Yes     PT 2 times per week     Social History Narrative    No narrative on file       Family History  Family History   Problem Relation Age of Onset    Diabetes Mother     Diabetes  Father     Hypertension Mother     Hypertension Father     Coronary Artery Disease Mother     Lung Cancer Sister      smoker       ROS: Other than ROS in the HPI, all other systems were negative     Physical Examination:   Temperature: 36.8 C (98.2 F) Heart Rate: 80 BP (Non-Invasive): 139/73 mmHg   Respiratory Rate: 18 SpO2-1: 97 % Pain Score (Numeric, Faces): 8   General: NAD, obese, chronically ill appearing, vitals noted to be WNL    HEENT: Head NCAT, B/L scleral anicterus, no conjunctival injection, PERRLA, EOMI, intact gross hearing acuity, dry mucous membranes, no oropharyngeal injection or exudates    Neck: supple, no LAD   Resp: normal WOB, CTAB    CV: RRR, nlS1S2, no m/r/g, non-displaced PMI   GI: +BS, soft, ND, TTP in RLQ with voluntary guarding but no rebound or Rovsing signs, R. CVA tenderness   Ext: dry, warm, well perfused, palpable distal pulses, no c/c/e   Neuro: A&Ox3, grossly normal    Labs:    Lab Results for Last 24 Hours:    Results for orders placed during the hospital encounter of 11/26/13 (from the past 24 hour(s))   CBC/DIFF       Result Value Range    WBC 15.9 (*) 3.5 - 11.0 THOU/uL    RBC 4.34  3.63 - 4.92 MIL/uL    HGB 12.4  11.2 - 15.2 g/dL    HCT 37.0  33.5 - 45.2 %    MCV 85.2  78 - 100 fL    MCH 28.5  27.4 - 33.0 pg    MCHC 33.5  32.5 - 35.8 g/dL    RDW 15.2 (*) 12.0 - 15.0 %    PLATELET COUNT 284  140 - 450 THOU/uL    MPV 8.2  7.5 - 11.5 fL    PMN'S 96      PMN ABS 15.132 (*) 1.500 - 7.700 THOU/uL    LYMPHOCYTES 3      LYMPHS ABS 0.467 (*) 1.000 - 4.800 THOU/uL    MONOCYTES 1      MONOS ABS 0.227 (*) 0.300 - 1.000 THOU/uL    EOSINOPHIL 0      EOS ABS 0.013  0.000 - 0.500 THOU/uL    BASOPHILS 0      BASOS ABS 0.031  0.000 - 0.200 THOU/uL   BASIC METABOLIC PANEL, NON-FASTING       Result Value Range    SODIUM 139  136 - 145 mmol/L    POTASSIUM 5.0  3.5 - 5.1 mmol/L    CHLORIDE 106  96 - 111 mmol/L    CARBON DIOXIDE 17 (*) 22 - 32 mmol/L    ANION GAP 16 (*) 4 - 13 mmol/L     CREATININE 1.75 (*) 0.49 - 1.10 mg/dL    ESTIMATED GLOMERULAR FILTRATION RATE 30 (*) >59 ml/min/1.33m2    GLUCOSE,NONFAST 334 (*) 65 - 139 mg/dL    BUN 19  8 - 25 mg/dL    BUN/CREAT RATIO 11  6 - 22    CALCIUM 9.6  8.5 - 10.4 mg/dL   MAGNESIUM       Result Value Range    MAGNESIUM 1.4 (*) 1.6 - 2.5 mg/dL   PHOSPHORUS       Result Value Range    PHOSPHORUS 2.8  2.3 - 4.0 mg/dL   LACTIC ACID       Result Value Range    LACTIC ACID 1.2  0.5 - 2.2 mmol/L   HEPATIC FUNCTION PANEL       Result Value Range    ALBUMIN 3.2 (*) 3.4 - 4.8 g/dL    BILIRUBIN, TOTAL 0.3  0.3 - 1.3 mg/dL    ALKALINE PHOSPHATASE 126  <150 U/L    AST (SGOT) 23  8 - 41 U/L    ALT (SGPT) 22  <55 U/L    BILIRUBIN,CONJUGATED <0.2  <0.3 mg/dL    TOTAL PROTEIN 8.1 (*) 6.0 - 8.0 g/dL   LIPASE       Result Value Range    LIPASE <5 (*) 10 - 80 U/L   ADULT ROUTINE BLOOD CULTURE, SET OF 2 BOTTLES (BACTERIA AND YEAST)       Result Value Range    SPECIMEN DESCRIPTION        Value: BLOOD      LEFT HAND    SPECIAL REQUESTS        Value: 1 BacT/ALERT FA and 1 BacT/ALERT SN bottle received    POSITIVE CULTURE NOT REPORTED      CULTURE OBSERVATION NO GROWTH 1 DAY      REPORT STATUS PENDING     ADULT ROUTINE BLOOD CULTURE, SET OF 2 BOTTLES (BACTERIA AND YEAST)       Result Value Range    SPECIMEN DESCRIPTION        Value: BLOOD      SITE NOT SPECIFIED    SPECIAL REQUESTS        Value: 1 BacT/ALERT FA and 1 BacT/ALERT SN bottle received    POSITIVE CULTURE NOT REPORTED      CULTURE OBSERVATION NO GROWTH 1 DAY      REPORT STATUS PENDING     VENOUS BLOOD GAS       Result Value Range    %FIO2 21      PH 7.410  7.310 - 7.410    PCO2 32.0 (*) 41.0 - 51.0 mm Hg    PO2 52 (*) 35 - 50 mm Hg    BICARBONATE 22.0  22.0 - 26.0 mmol/L    BASE EXCESS Test Not Performed  0.0 - 2.0 mmol/L    BASE DEFICIT 3.4 (*) 0.0 - 2.0 mmol/L    Specimen Type VENOUS     URINALYSIS WITH CULTURE REFLEX       Result Value Range    CHARACTER CLEAR      COLOR YELLOW      SPECIFIC GRAVITY, URINE 1.009   1.005 - 1.030    GLUCOSE 300 (*) NEGATIVE  mg/dL    BILIRUBIN NEGATIVE  NEGATIVE    KETONES 10 (*) NEGATIVE mg/dL    BLOOD NEGATIVE  NEGATIVE    PH URINE 6.5  5.0 - 8.0    PROTEIN 30 (*) NEGATIVE mg/dL    UROBILINOGEN NORMAL  0.2 mg/dL    NITRITE POSITIVE (*) NEGATIVE    LEUKOCYTES NEGATIVE  NEGATIVE   URINALYSIS, MICROSCOPIC       Result Value Range    RBC'S    0 - 4 /HPF    Value: 1  MICROSCOPIC ANALYSIS PERFORMED BY AUTOMATED METHOD    WBC'S 3  0 - 6 /HPF    BACTERIA OCCASIONAL OR LESS  OCL^OCCASIONAL OR LESS   CREATININE URINE, RANDOM       Result Value Range    CREATININE, UR RAND 53 (*) NO REFERENCE RANGES ESTABLISHED mg/dL   SODIUM, RANDOM URINE       Result Value Range    SODIUM, URINE RAND 88 (*) NO REFERENCE RANGES ESTABLISHED mmol/L   UREA NITROGEN, RANDOM URINE       Result Value Range    UREA NITROGEN, RANDOM 202 (*) NO REFERENCE RANGES ESTABLISHED mg/dL   POCT WHOLE BLOOD GLUCOSE       Result Value Range    GLUCOSE, POINT OF CARE 322 (*) 70 - 105 mg/dL       Imaging Studies:           Assessment/Plan:   Christy Hale is a 73 y.o. 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     Nausea, Vomiting, Diarrhea, Abd Pain    DDx: malignancy, acute gastroenteritis, C. Diff colitis,     Lab & imaging significant for leukocytosis (15.9) thus far (VBG, lactate, lipase, HFP, & lytes all unremarkable). C. Diff pending      Ceftazidime, PRN Zofran, PRN Phenergan   R. Abd Wall mass, BRBPR, Constipation now Diarrhea    DDx: malignancy,     Significant improvement with the BRBPR with improved H&H since discharged    Colonoscopy on 11/22/13 showed no abnormalities or active bleeding site, IR guided biopsy results of the mass still pending at this time   LUE Swelling    DDx: DVT, superficial clot. Not likely  cellulitis (no calor, dolor or rubor)     Peripheral venous duplex ordered   R. CVA tenderness, Recent UTI   DDx: pyelonephritis, nephrolithiasis, malignancy, cystitis    Good urine output via ileal conduit,    UCx from [facility] grew Achromobacter Xylosoxidans sensitive to Bactrim during last adm    UA tonight was + for nitrite but no leukocytes or bacteria. 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     Cr 1.75 with baseline 1.3, BUN/Cr ratio 11, calc FeNa during recent hosp >2% suggestive of intrinsic kidney disease   FEN/GI   Hypomagnesemia: mild with no other electrolyte abnl, conservative management for now    Protein malnutrition & deconditioning: likely 2/2 chronic disease and malignancies, PT/OT, MNT protocol ordered      Chronic Medical Conditions:    CHF: Conservative fluid management, holding Lasix given AKI    HTN:  Clonidine transdermal patch 0.1 mg (weekly changes), Norvasc to 10 mg daily    HLD: Zocor 40 mg qpm    DM I: SSI protocol, Lantus 10 one time dose given dec PO intake (normally on 30 units BID)    GERD: Protonix 20 mg BID    Hypothyroidism: synthroid 25 mcg    Depression: Celexa 40 mg daily    Tremor: Pin-rolling tremor with cogwheel rigidity observed during previous admission; patient is scheduled to f/u with Neurology outpatient on 01/12/14       DVT/PE Prophylaxis: Heparin  Disposition Planning: Home discharge      DNR Status: Full Code      Tia Alert, MD   11/26/2013 20:27   Internal Medicine, PGY1   [facility]     I saw and examined the patient.  I reviewed the resident's note.  I agree with the findings and plan of care as documented in the resident's note.  Any exceptions/additions are edited/noted.    Loistine Simas, MD 11/27/2013, 11:33

## 2013-11-26 NOTE — Pharmacy Vancomycin Dosing (Signed)
[facility] / Department of Pharmaceutical Services  Therapeutic Drug Monitoring: Vancomycin  11/26/2013      Patient name: Christy Hale, Christy Hale  Date of Birth:  1941-05-25    Actual Weight:  Weight: 96.5 kg (212 lb 11.9 oz) (11/26/13 2132)     BMI:  BMI (Calculated): 38.99 (11/26/13 2132)    Indication (type of therapy/organism if known/site of infection):  bacteremia    Desired Levels: 10-15 mcg/mL         Pharmacist to dose and monitor levels per protocol: Yes    Date RPh Current regimen (including mg/kg) SCr  mg/dl CrCl*  ml/min Measured level mcg/ml Plan (including when levels are due) Comments   2/7 AG Vanc 1000mg  (15mg /kg) every 24 hrs 1.75 30.6  Check level with third dose due to patient's age, BMI and SCr.                                                                     *Creatinine clearance is estimated by using the Cockcroft-Gault equation for adult patients and the Carol Ada for pediatric patients.    The decision to discontinue vancomycin therapy will be determined by the primary service.  Please contact the pharmacist with any questions regarding this patient's medication regimen.

## 2013-11-27 ENCOUNTER — Inpatient Hospital Stay (HOSPITAL_COMMUNITY): Payer: Commercial Managed Care - PPO

## 2013-11-27 DIAGNOSIS — R112 Nausea with vomiting, unspecified: Secondary | ICD-10-CM

## 2013-11-27 DIAGNOSIS — R1903 Right lower quadrant abdominal swelling, mass and lump: Secondary | ICD-10-CM

## 2013-11-27 DIAGNOSIS — K625 Hemorrhage of anus and rectum: Secondary | ICD-10-CM

## 2013-11-27 DIAGNOSIS — N179 Acute kidney failure, unspecified: Secondary | ICD-10-CM

## 2013-11-27 DIAGNOSIS — R109 Unspecified abdominal pain: Secondary | ICD-10-CM

## 2013-11-27 LAB — CBC/DIFF
BASOPHILS: 0 %
BASOS ABS: 0.038 THOU/uL (ref 0.000–0.200)
EOS ABS: 0.013 10*3/uL (ref 0.000–0.500)
EOSINOPHIL: 0 %
HCT: 34.5 % (ref 33.5–45.2)
HGB: 11.5 g/dL (ref 11.2–15.2)
LYMPHOCYTES: 8 %
LYMPHS ABS: 1.091 THOU/uL (ref 1.000–4.800)
MCH: 28.6 pg (ref 27.4–33.0)
MCHC: 33.5 g/dL (ref 32.5–35.8)
MCV: 85.4 fL (ref 78–100)
MCV: 85.4 fL (ref 78–100)
MONOCYTES: 6 %
MONOS ABS: 0.797 THOU/uL (ref 0.300–1.000)
MPV: 8 fL (ref 7.5–11.5)
PLATELET COUNT: 284 THOU/uL (ref 140–450)
PMN ABS: 10.998 10*3/uL — ABNORMAL HIGH (ref 1.500–7.700)
PMN'S: 86 %
RBC: 4.04 MIL/uL (ref 3.63–4.92)
RDW: 14.8 % (ref 12.0–15.0)
WBC: 12.9 THOU/uL — ABNORMAL HIGH (ref 3.5–11.0)

## 2013-11-27 LAB — BASIC METABOLIC PANEL
ANION GAP: 12 mmol/L (ref 4–13)
BUN/CREAT RATIO: 12 (ref 6–22)
BUN: 22 mg/dL (ref 8–25)
CALCIUM: 9.2 mg/dL (ref 8.5–10.4)
CARBON DIOXIDE: 20 mmol/L — ABNORMAL LOW (ref 22–32)
CHLORIDE: 105 mmol/L (ref 96–111)
CREATININE: 1.8 mg/dL — ABNORMAL HIGH (ref 0.49–1.10)
CREATININE: 1.8 mg/dL — ABNORMAL HIGH (ref 0.49–1.10)
ESTIMATED GLOMERULAR FILTRATION RATE: 29 mL/min/{1.73_m2} — ABNORMAL LOW (ref >59)
GLUCOSE,NONFAST: 229 mg/dL — ABNORMAL HIGH (ref 65–139)
POTASSIUM: 4.1 mmol/L (ref 3.5–5.1)
SODIUM: 137 mmol/L (ref 136–145)

## 2013-11-27 LAB — PERFORM POC WHOLE BLOOD GLUCOSE
GLUCOSE, POINT OF CARE: 206 mg/dL — ABNORMAL HIGH (ref 70–105)
GLUCOSE, POINT OF CARE: 215 mg/dL — ABNORMAL HIGH (ref 70–105)
GLUCOSE, POINT OF CARE: 274 mg/dL — ABNORMAL HIGH (ref 70–105)
GLUCOSE, POINT OF CARE: 265 mg/dL — ABNORMAL HIGH (ref 70–105)
GLUCOSE, POINT OF CARE: 126 mg/dL — ABNORMAL HIGH (ref 70–105)

## 2013-11-27 MED ORDER — PROMETHAZINE 25 MG/ML INJECTION SOLUTION
25.00 mg | Freq: Four times a day (QID) | INTRAMUSCULAR | Status: DC | PRN
Start: 2013-11-27 — End: 2013-11-28
  Administered 2013-11-27: 25 mg via INTRAMUSCULAR
  Filled 2013-11-27 (×3): qty 1

## 2013-11-27 MED ORDER — HYDROMORPHONE 1 MG/ML INJECTION WRAPPER
0.2000 mg | INJECTION | Freq: Once | INTRAMUSCULAR | Status: AC
Start: 2013-11-27 — End: 2013-11-27
  Administered 2013-11-27: 0.2 mg via INTRAVENOUS
  Filled 2013-11-27: qty 1

## 2013-11-27 MED ORDER — PANTOPRAZOLE 40 MG INTRAVENOUS SOLUTION
40.0000 mg | Freq: Every day | INTRAVENOUS | Status: DC
Start: 2013-11-27 — End: 2013-11-28
  Administered 2013-11-27 – 2013-11-28 (×2): 40 mg via INTRAVENOUS
  Filled 2013-11-27 (×2): qty 10

## 2013-11-27 MED ORDER — SODIUM CHLORIDE 0.9 % INTRAVENOUS SOLUTION
INTRAVENOUS | Status: DC
Start: 2013-11-27 — End: 2013-11-28

## 2013-11-27 MED ADMIN — lactated Ringers intravenous solution: INTRAVENOUS | @ 08:00:00

## 2013-11-27 MED ADMIN — sodium chloride 0.9 % intravenous solution: INTRAMUSCULAR | @ 11:00:00 | NDC 00338004904

## 2013-11-27 MED ADMIN — lactated Ringers intravenous solution: INTRAVENOUS | NDC 00338011704

## 2013-11-27 NOTE — Nurses Notes (Addendum)
Patient c/o pain in RLQ 5/10.  Refused ultram due to nausea. Medicated with zofran. And service notified for possible IV pain medication.

## 2013-11-27 NOTE — Progress Notes (Addendum)
I was called by Ms. Sebastiani's nurse at 3:00 PM due to concern that she was unable to tolerate PO due to recurrent vomiting. Her nurse requested that her Protonix be made IV instead of PO. I made this change.     Ileana Ladd, MD 11/27/2013, 15:05

## 2013-11-27 NOTE — Care Plan (Signed)
Problem: General Plan of Care(Adult,OB)  Goal: Plan of Care Review(Adult,OB)  The patient and/or their representative will communicate an understanding of their plan of care   Outcome: Ongoing (see interventions/notes)  Patient transferred from [facility] hospital with complaints of N/V and abdominal pain. IV hydration and antibiotics started. N/V has been controlled with prn zofran and phenergan. Chest xray completed and orders in for an ultrasound of the kidney. Fall and skin precautions maintained. Plan of care reviewed with patient and daughter.

## 2013-11-27 NOTE — Nurses Notes (Signed)
Fingerstick 276. Spoke with Dr. [name] and orders to give Lispro 2 units now and recheck fingerstick in 2 hours.

## 2013-11-27 NOTE — Care Plan (Signed)
Problem: General Plan of Care(Adult,OB)  Goal: Plan of Care Review(Adult,OB)  The patient and/or their representative will communicate an understanding of their plan of care   Outcome: Ongoing (see interventions/notes)  Admitted for N/V/D. Several emesis this morning small amount of green liquid and service was aware. zofran was given with no relief. Notified service and orders were changed to phenergan 25 mg IM. Patient was unable to tolerate clear liquid diet. MIVF increased to 100 HR. Phenergan was given and patient slept for most of the afternoon . Finger stick 274 and Dr. [name] notified and orders to give only 2 units of sliding scale. And repeat finger stick in 2 hours..[facility] re accessed. Not blood return but flushes well. According to patient infusaport does not draw. Patient was able to tolerate evening meal of clear liquid without nausea or vomiting. Social service notified today. Patient will need assist with transportation home possibly tomorrow.

## 2013-11-27 NOTE — Nurses Notes (Signed)
Fingerstick 215. Patient is NPO for now due to vomiting. No insulin coverage given.

## 2013-11-27 NOTE — Nurses Notes (Signed)
Vomiting green liquid x3. Service notified and orders received. Phenergan 25 mg given IM and increased MIVF to 100/hr. Requested to patient to be NPO until after KUB.

## 2013-11-27 NOTE — Nurses Notes (Signed)
Repeat finger stick 265. Dr. [name] notified. See [facility]

## 2013-11-28 ENCOUNTER — Inpatient Hospital Stay (HOSPITAL_COMMUNITY): Payer: Commercial Managed Care - PPO

## 2013-11-28 DIAGNOSIS — N1330 Unspecified hydronephrosis: Secondary | ICD-10-CM

## 2013-11-28 DIAGNOSIS — K529 Noninfective gastroenteritis and colitis, unspecified: Secondary | ICD-10-CM | POA: Diagnosis present

## 2013-11-28 DIAGNOSIS — M7989 Other specified soft tissue disorders: Secondary | ICD-10-CM

## 2013-11-28 DIAGNOSIS — Q649 Congenital malformation of urinary system, unspecified: Secondary | ICD-10-CM

## 2013-11-28 DIAGNOSIS — M79609 Pain in unspecified limb: Secondary | ICD-10-CM

## 2013-11-28 LAB — BASIC METABOLIC PANEL
ANION GAP: 8 mmol/L (ref 4–13)
BUN/CREAT RATIO: 14 (ref 6–22)
BUN: 20 mg/dL (ref 8–25)
CALCIUM: 8.4 mg/dL — ABNORMAL LOW (ref 8.5–10.4)
CARBON DIOXIDE: 21 mmol/L — ABNORMAL LOW (ref 22–32)
CHLORIDE: 108 mmol/L (ref 96–111)
CREATININE: 1.48 mg/dL — ABNORMAL HIGH (ref 0.49–1.10)
ESTIMATED GLOMERULAR FILTRATION RATE: 37 mL/min/{1.73_m2} — ABNORMAL LOW (ref >59)
ESTIMATED GLOMERULAR FILTRATION RATE: 37 ml/min/1.73m2 — ABNORMAL LOW (ref >59)
GLUCOSE,NONFAST: 210 mg/dL — ABNORMAL HIGH (ref 65–139)
POTASSIUM: 4.6 mmol/L (ref 3.5–5.1)
SODIUM: 137 mmol/L (ref 136–145)

## 2013-11-28 LAB — CBC/DIFF
BASOPHILS: 1 %
BASOS ABS: 0.045 10*3/uL (ref 0.000–0.200)
EOS ABS: 0.287 10*3/uL (ref 0.000–0.500)
EOSINOPHIL: 4 %
HCT: 30.7 % — ABNORMAL LOW (ref 33.5–45.2)
HGB: 10.2 g/dL — ABNORMAL LOW (ref 11.2–15.2)
LYMPHOCYTES: 26 %
LYMPHS ABS: 1.852 10*3/uL (ref 1.000–4.800)
MCH: 28.6 pg (ref 27.4–33.0)
MCHC: 33.1 g/dL (ref 32.5–35.8)
MCV: 86.3 fL (ref 78–100)
MONOCYTES: 14 %
MONOS ABS: 0.95 THOU/uL (ref 0.300–1.000)
MPV: 8.2 fL (ref 7.5–11.5)
PLATELET COUNT: 240 THOU/uL (ref 140–450)
PMN ABS: 3.917 10*3/uL (ref 1.500–7.700)
PMN'S: 55 %
RBC: 3.55 MIL/uL — ABNORMAL LOW (ref 3.63–4.92)
RDW: 15 % (ref 12.0–15.0)
WBC: 7.1 10*3/uL (ref 3.5–11.0)

## 2013-11-28 LAB — MAGNESIUM: MAGNESIUM: 1.4 mg/dL — ABNORMAL LOW (ref 1.6–2.5)

## 2013-11-28 LAB — PERFORM POC WHOLE BLOOD GLUCOSE
GLUCOSE, POINT OF CARE: 202 mg/dL — ABNORMAL HIGH (ref 70–105)
GLUCOSE, POINT OF CARE: 311 mg/dL — ABNORMAL HIGH (ref 70–105)

## 2013-11-28 LAB — URINE CULTURE,ROUTINE: CULTURE OBSERVATION: 50000

## 2013-11-28 LAB — PHOSPHORUS: PHOSPHORUS: 2.7 mg/dL (ref 2.3–4.0)

## 2013-11-28 MED ORDER — MAGNESIUM OXIDE 400 MG (241.3 MG MAGNESIUM) TABLET
400.0000 mg | ORAL_TABLET | Freq: Two times a day (BID) | ORAL | Status: DC
Start: 2013-11-28 — End: 2013-11-28
  Administered 2013-11-28: 400 mg via ORAL
  Filled 2013-11-28 (×2): qty 1

## 2013-11-28 MED ADMIN — pantoprazole 40 mg intravenous solution: INTRAVENOUS | @ 10:00:00

## 2013-11-28 MED ADMIN — amLODIPine 10 mg tablet: ORAL | @ 10:00:00

## 2013-11-28 NOTE — Procedures (Addendum)
[facility]  Cardiovascular and Interventional Services  Right Lower Extremity Venous Duplex    Name: Christy Hale, 73 y.o., female    MRN: [id number]   DOB: 19-Jul-1941  Indication: Venous embolism/ thrombosis  Requesting Physician: Loistine Simas, MD  Performing Tech:  Herschel Senegal, RVT    Date of Service: 11/28/2013  Marland KitchenNV=Not Visualized)                            Past History:  Malignancy: yes  Heart Disease: yes   Pregnancy: no  Obese: yes    Pulm. Emboli: no  Anticoagulant: no  Previous Clot: no     Trauma: no  Phlebitis: no  Hormones: no  Oral Contraceptives: No    LEFT COMMON FEMORAL VEIN  Left CFV Visualized: yes    CFV Compressibility Left: good  CFV Phasic Left: good  CFV Spontaneous Left: good  CFV Augmentation Left: good  CFV Filling Defect Left: no  CFV Reflux Left: no    RIGHT COMMON FEMORAL VEIN  Right CFV Visualized: yes    CFV Compressibility Right: good  CFV Phasic Right: good  CFV Spontaneous Right: good  CFV Augmentation Right: good  CFV Filling Defect Right: no  CFV Reflux Right: no    RIGHT SUPERFICIAL FEMORAL VEIN  Right SFV Visualized: yes    SFV Compressibility Right: good  SFV Phasic Right: good  SFV Spontaneous Right: good  SFV Augmentation Right: good  SFV Filling Defect Right: no  SFV Reflux Right: no    RIGHT POPLITEAL  Right Popliteal Visualized: yes    Popliteal Compressibility Right: good  Popliteal Phasic Right: good  Popliteal Spontaneous Right: good  Popliteal Augmentation Right: good  Popliteal Filling Defect Right: no  Popliteal Reflux Right: no    RIGHT PTV and PERONEAL  PTV Right: Normal  Peroneal Right: Normal     RIGHT GSV  GSV Right: Normal    RIGHT DVT  DVT Rt Leg: Negative  GSV Rt Leg: Normal    Performing Tech:  Herschel Senegal, RVT 11/28/2013, 11:38    Reviewing Sonographer Impression:     Right Leg: Deep Vein Thrombosis: Negative  Greater Saphenous Vein: Normal  Venous reflux noted: No    Right lower extremity venous duplex negative for DVT.  Negative for superficial clots within limits of the exam.    Richardson Chiquito, RVS 11/28/2013, 14:15    This is a preliminary report until verified by a physician. I have reviewed this non invasive vascular examination.    Conclusion:  No evidence of  right lower extremity DVT or venous insufficiency within the limits of the examination.        [name], MD 11/29/2013, 82:70

## 2013-11-28 NOTE — Progress Notes (Addendum)
[facility]  Medicine Progress Note    Christy Hale  Date of service: 11/28/2013  Date of Admission:  11/26/2013    Hospital Day:  LOS: 2 days   Subjective: No acute events overnight. Patient reports no nausea, vomiting, or diarrhea. States she is feeling much better.     Vital Signs:  Temp  Avg: 36.8 C (98.3 F)  Min: 36.8 C (98.2 F)  Max: 37 C (98.6 F)    Pulse  Avg: 73.8  Min: 69  Max: 83 BP  Min: 127/79  Max: 174/73   Resp  Avg: 18.4  Min: 18  Max: 20 SpO2  Avg: 91 %  Min: 88 %  Max: 93 %   Pain Score (Numeric, Faces): 5      Input/Output    Intake/Output Summary (Last 24 hours) at 11/28/13 0726  Last data filed at 11/28/13 0649   Gross per 24 hour   Intake   3025 ml   Output   1800 ml   Net   1225 ml    I/O last shift:  02/09 0000 - 02/09 0759  In: 990 [P.O.:240; I.V.:750]  Out: 1250 [Urine:1250]     Current Facility-Administered Medications:  amLODIPine (NORVASC) tablet 10 mg Oral Daily   cefTAZidime (FORTAZ) 1 g in D5W 50 mL IVPB 1 g Intravenous Q24H   citalopram (CELEXA) tablet 40 mg Oral Daily   cloNIDine (CATAPRES-TTS) transdermal patch (mg/24 hr) 0.1 mg Transdermal Q7 Days   heparin 5,000 unit/mL injection 5,000 Units Subcutaneous Q8HRS   heparin flush PF (HEPLOCK) 100 units/mL injection 5-10 mL Intracatheter Q8HRS   heparin flush PF (HEPLOCK) 100 units/mL injection 5 mL Intracatheter Q1 MIN PRN   heparin flush PF (HEPLOCK) 100 units/mL injection 5-10 mL Intracatheter Daily PRN   levothyroxine (SYNTHROID) tablet 25 mcg Oral QAM   NS flush syringe 10-20 mL Intravenous Q8HRS   NS flush syringe 20-30 mL Intravenous Q1 MIN PRN   NS premix infusion  Intravenous Continuous   ondansetron (ZOFRAN) 2 mg/mL injection 4 mg Intravenous Q8H PRN   pantoprazole (PROTONIX) injection 40 mg Intravenous Daily   promethazine (PHENERGAN) 25 mg/mL IM injection 25 mg Intramuscular Q6H PRN   simvastatin (ZOCOR) tablet 40 mg Oral Daily   SSIP insulin lispro (HUMALOG) 100 units/mL injection 2-6 Units  Subcutaneous 4x/day PRN   traMADol (ULTRAM) tablet 50 mg Oral Daily PRN   vancomycin (VANCOCIN) 1 g in iso-osmotic 200 mL premix IVPB 15 mg/kg (Adjusted) Intravenous Q24H   Vancomycin - Pharmacist to Dose per Protocol  Does not apply Daily PRN       Physical Exam:  General: appears chronically ill and no distress  Eyes: Conjunctiva clear.  HENT:Head atraumatic and normocephalic, Mouth mucous membranes moist.   Neck: no adenopathy  Lungs: Clear to auscultation bilaterally.   Cardiovascular: regular rate and rhythm, S1, S2 normal, no murmur, click, rub or gallop  Abdomen: Soft, non-tender, Bowel sounds normal, non-distended. Palpable mass 10 oclock from umbilicus  Extremities: Positive Hohman sign in right leg  Skin: Skin warm and dry and No rashes  Neurologic: Alert and oriented x3  Lymphatics: No lymphadenopathy    Labs:      CBC:  Recent Labs      11/26/13   2056  11/27/13   0528  11/28/13   0458   WBC  15.9*  12.9*  7.1   HGB  12.4  11.5  10.2*   HCT  37.0  34.5  30.7*   PLTCNT  284  284  240     Differential:   Recent Labs      11/26/13   2056  11/27/13   0528  11/28/13   0458   PMNS  96  86  55   LYMPHOCYTES  3  8  26    MONOCYTES  1  6  14    EOSINOPHIL  0  0  4   BASOPHILS  0  0  1   PMNABS  15.132*  10.998*  3.917   LYMPHSABS  0.467*  1.091  1.852   MONOSABS  0.227*  0.797  0.950   EOSABS  0.013  0.013  0.287   BASOSABS  0.031  0.038  0.045     BMP:  Recent Labs      11/26/13   2056  11/27/13   0528  11/28/13   0458   SODIUM  139  137  137   POTASSIUM  5.0  4.1  4.6   CHLORIDE  106  105  108   CO2  17*  20*  21*   BUN  19  22  20    CREATININE  1.75*  1.80*  1.48*   GLUCOSENF  334*  229*  210*   ANIONGAP  16*  12  8   GFR  30*  29*  37*     Recent Labs      11/26/13   2056  11/27/13   0528  11/28/13   0458   CALCIUM  9.6  9.2  8.4*   MAGNESIUM  1.4*   --   1.4*   PHOSPHORUS  2.8   --   2.7     RBC Indices:  Recent Labs      11/26/13   2056  11/27/13   0528  11/28/13   0458   MCV  85.2  85.4  86.3   MCH  28.5   28.6  28.6   MCHC  33.5  33.5  33.1   RDW  15.2*  14.8  15.0   MPV  8.2  8.0  8.2     Coagulation Studies:  No results found for this basename: PTT, PT, INR, PROTHROMTME, APTT, CREACTPROTIE,  in the last 72 hours  Liver/Pancreas:  Recent Labs      11/26/13   2056   TOTALPROTEIN  8.1*   ALBUMIN  3.2*   AST  23   ALT  22   ALKPHOS  126   LIPASE  <5*       Radiology:    Results for orders placed during the hospital encounter of 11/26/13 (from the past 72 hour(s))   XR AP MOBILE CHEST     Status: Normal    Narrative:     Christy Hale is a 73 y.o.female.    HISTORY:SOB, cough.     TECHNIQUE:  XR AP MOBILE CHEST performed Nov 26, 2013 11:13 PM.      Impression:      A single AP view shows cardiac enlargement without   interstitial edema.  No airspace consolidation seen.               PT/OT: No    Consults: none     Hardware (lines, foley's, tubes): Implantable port, Urostomy 9 days     Assessment/ Plan:   Active Hospital Problems    Diagnosis    Primary Problem: Gastroenteritis, acute    Mass in the abdomen  UTI (urinary tract infection)    AKI (acute kidney injury)    Type 2 diabetes mellitus     Summary: Christy Hale is a 73 y.o. 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     Acute Gastroenteritis   Leukocytosis improved to 7.1   Continue NS 100 cc/hr    Nausea control: Phenergan 25 mg IV prn, Zofran prn   Advance to regular diet today     R. 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    Cr 1.48 today;  continue IVF    FEN/GI   Hypomagnesemia: 1.4 today; will supplement with MgOx 400 mg bid     Chronic Medical Conditions:   CHF: Conservative fluid management, holding Lasix given AKI   HTN: Clonidine transdermal patch 0.1 mg (weekly changes), Norvasc to 10 mg daily   HLD: Zocor 40 mg qpm   DM I: SSI protocol, Lantus 10 one time dose given dec PO intake (normally on 30 units BID)   GERD: Protonix 20 mg BID   Hypothyroidism: synthroid 25 mcg   Depression: Celexa 40 mg daily   Tremor: Pin-rolling tremor with cogwheel rigidity observed during previous admission; patient is scheduled to f/u with Neurology outpatient on 01/12/14         DVT/PE Prophylaxis: Heparin    Disposition Planning: Home discharge      Masih Janace Aris, MD 11/28/2013, 07:26      I saw and examined the patient.  I reviewed the resident's note.  I agree with the findings and plan of care as documented in the resident's note.  Any exceptions/additions are edited/noted.    Loistine Simas, MD 11/28/2013, 20:52

## 2013-11-28 NOTE — Care Management Notes (Signed)
[facility]  Care Management Note    Patient Name: Christy Hale  Date of Birth: 05-28-1941  Sex: female  Date/Time of Admission: 11/26/2013  8:18 PM  Room/Bed: 880/B  Payor: HU[employer]NA MEDICARE / Plan: HU[employer]NA CHOICE PPO / Product Type: PPO /    LOS: 2 days   PCP: Timmie Foerster, DO    Admitting Diagnosis:  nausea/vomiting/ abdominal pain    Assessment:   Pt is being d/c today to home with family.   Pt was just d/c less than a week ago.  She returned to the hospital with what appears to be a stomach virus.  Several members of her family have been sick with the same symptoms.  I met with pt and a family member.  They inquired about ambulance transport to home and I made them aware that pt really is not a candidate for an ambulance.  She can sit and ambulate independently.  Family will transport pt.  No d/c planning needs.     Discharge Plan:  Home(Patient/Family Member/other) (code 1)      The patient will continue to be evaluated for developing discharge needs.     Case Manager: Ted Mcalpine Leonore, SW 11/28/2013, 15:39  Phone: [phone]

## 2013-11-28 NOTE — Discharge Summary (Addendum)
DISCHARGE SUMMARY      PATIENT NAME:  Christy Hale, Christy Hale  MRN:  [id number]  DOB:  [birth year]    ADMISSION DATE:  11/26/2013  DISCHARGE DATE:  11/27/13    ATTENDING PHYSICIAN: Loistine Simas MD  PRIMARY CARE PHYSICIAN: Timmie Foerster, DO     DISCHARGE DIAGNOSIS:   Principle Problem: Gastroenteritis, acute  Active Hospital Problems    Diagnosis Date Noted    Mass in the abdomen 11/20/2013    UTI (urinary tract infection) 11/20/2013    AKI (acute kidney injury) 12/31/2010    Type 2 diabetes mellitus       Resolved Hospital Problems    Diagnosis     Principle Problem: Gastroenteritis, acute      Active Non-Hospital Problems    Diagnosis Date Noted    Hypocalcemia 58/52/7782    Metabolic acidosis 42/35/3614    Hypomagnesemia 12/31/2010    Hx of total cystectomy 12/31/2010    Hx of unilateral nephrectomy 12/31/2010    Respiratory failure following trauma and surgery 12/31/2010    Bladder cancer 12/20/2010    Hyperlipidemia       DISCHARGE MEDICATIONS:  Discharge Medication List as of 11/28/2013  4:00 PM      CONTINUE these medications which have NOT CHANGED    Details   amLODIPine (NORVASC) 10 mg Oral Tablet Take 1 Tab (10 mg total) by mouth Once a day for 28 days, Disp-28 Tab, R-0, Print      citalopram (CELEXA) 40 mg Oral Tablet Take 40 mg by mouth Once a day, Historical Med      cloNIDine (CATAPRES-TTS) 0.1 mg/24 hr Transdermal Patch Weekly Historical Med      INSULIN GLARGINE,HUM.REC.ANLOG (LANTUS SUBQ) 30 Units by Subcutaneous route Every night , Historical Med      INSULIN LISPRO (HUMALOG SUBQ) by Subcutaneous route Twice daily On a sliding scale  Takes 22 to 28 units on sliding scale, Historical Med      levothyroxine (SYNTHROID) 25 mcg Oral Tablet Take 25 mcg by mouth Once a day , Historical Med      metoclopramide HCl (REGLAN) 5 mg Oral Tablet 5 mg Three times daily before meals , Historical Med      omeprazole (PRILOSEC) 20 mg Oral Capsule, Delayed Release(E.C.) Take 20 mg by mouth Twice daily , Historical  Med      polyethylene glycol (MIRALAX) 17 gram Oral Powder in Packet Take 1 Packet (17 g total) by mouth Once per day as needed for up to 28 days, Disp-1 Packet, R-5, Print      promethazine (PHENERGAN) 25 mg Oral Tablet Take 25 mg by mouth Three times a day as needed, Historical Med      sennosides-docusate sodium (SENOKOT-S) 8.6-50 mg Oral Tablet take 1 Tab by mouth QPM., Disp-30 Tab, R-0, Print      simvastatin (ZOCOR) 40 mg Oral Tablet 40 mg Once a day , Historical Med      traMADol (ULTRAM) 50 mg Oral Tablet Three times a day as needed One tab at bedtime, Historical Med         STOP taking these medications       Cholecalciferol, Vitamin D3, (VITAMIN D-3) 400 unit Oral Capsule Comments:   Reason for Stopping:         ferrous sulfate (FERATAB) 324 mg (65 mg iron) Oral Tablet, Delayed Release (E.C.) Comments:   Reason for Stopping:         furosemide (LASIX) 20 mg Oral Tablet Comments:  Reason for Stopping:         KLOR-CON M20 20 mEq Oral Tab Sust.Rel. Particle/Crystal Comments:   Reason for Stopping:         NIACIN, INOSITOL NIACINATE, ORAL Comments:   Reason for Stopping:         trimethoprim-sulfamethoxazole (BACTRIM DS) 800-160 mg Oral Tablet Comments:   Reason for Stopping:             DISCHARGE INSTRUCTIONS:    No discharge procedures on file.     REASON FOR HOSPITALIZATION AND HOSPITAL COURSE:  This is a 73 y.o., female with PMH of CHF, HTN, DM, seizures, and TCC s/p nephrectomy who was admitted for intractable nausea and vomiting, diarrhea, and poor oral intake. She had recently been admitted at [facility] for [facility] and right sided abdominal wall mass. She was started on Ceftazidime as there was initial concern her previous UTI had not been treated effectively, however this was discontinued after one day as she did not have any urinary symptoms. She was started on IVF and given phenergan, Zofran for nausea control. She had LUE and RLE swelling so duplex studies were done which were negative. She had AKI on  admission which resolved to her baseline on fluids. She was able to tolerate a regular diet on second day of admission. Diarrhea and nausea stopped. She had several family members with similar gastroenteritis symptoms so she was diagnosed with acute viral gastroenteritis. She was stable for discharge at this time. Of note, preliminary biopsy reports on her abdominal wall mass from her previous admission were negative for TCC but were being worked up for any other type of malignancy. This information was passed on to the patient with instructions that we would call with further information.     CONDITION ON DISCHARGE:  A. Ambulation: Full ambulation  B. Self-care Ability: Complete  C. Cognitive Status Alert and Oriented x 3    DISCHARGE DISPOSITION:  Home discharge         [place], MD 11/29/2013, 23:39    Masih Janace Aris, MD        Copies sent to Care Team      Relationship Specialty Notifications Start End    Timmie Foerster, DO PCP - General EXTERNAL  04/08/12     Phone: [phone] Fax: [phone]         [address] [place] [address]          Referring providers can utilize [email or web address] to access their referred [employer] [facility] [employer] patient's information.

## 2013-11-28 NOTE — Procedures (Addendum)
[facility]  Cardiovascular and Interventional Services  Left Upper Extremity Venous Duplex    Name: Christy Hale, 73 y.o., female    MRN: [id number]   DOB: 04-29-1941  Indication: Venous embolism/ thrombosis  Requesting Physician: Loistine Simas, MD  Performing Tech:  Herschel Senegal, RVT    Date of Service: 11/28/2013         Marland KitchenNV=Not Visualized)    Past History:  Previous Clots: no   Pulmonary Emboli:  no  Malignancy:    yes  Anticoagulants: no  Heart Disease:  yes    Phlebitis: no  Trauma: no  Pregnancy: no  Hormones: no  Oral Contraceptives:  No    LEFT VENOUS FLOW PATENCY  Jugular Vein-  yes  Subclavian Vein-  yes (only distal seen due to picc line bandage)  Axillary Vein-  yes  Brachial Vein-  yes  Basilic Vein-  yes  Cephalic Vein-  yes    Right Subclavian Vein Patent: yes (only distal seen)    DVT Left Upper Extremity: Negative  Superficial Filling Defect Left Upper Extremity: Negative    Performing Tech:  Herschel Senegal, RVT 11/28/2013, 11:41    Reviewing Sonographer Impression:     Left Deep Vein Thrombosis: Negative  CEPHALIC: Normal  BASILIC: Normal    Left upper extremity venous duplex negative for DVT. Negative for superficial clots within limits of the exam.    Richardson Chiquito, RVS 11/28/2013, 14:10    This is a preliminary report until verified by a physician.       I have reviewed this non invasive vascular examination.    Conclusion:  No evidence of  left upper extremity venous thrombosis within the limits of the examination.        [name], MD 11/29/2013, 11:18

## 2013-11-28 NOTE — Nurses Notes (Signed)
[address]20-[address]ort deaccessed. No bleeding at site. Verbalizes discharge instructions. Discharged per w/c to lobby accompanied by family.

## 2013-11-29 LAB — HISTORICAL SURGICAL PATHOLOGY SPECIMEN

## 2013-11-29 NOTE — Care Management Notes (Signed)
Care Coordinator/Social Work Plan  [place]  Patient Name: Christy Hale   MRN: [id number]   [place] Number: [id number]  DOB: 1940/10/30 Age: 73  **Admission Information**  Patient Type: INPATIENT  Admit Date: 11/26/2013 Admit Time: 20:18  Admit Reason: nausea/vomiting/ abdominal pain  Admitting Phys: Loistine Simas A  Attending Phys: Loistine Simas A  Unit: 8E Bed: 880-B  Discharge Information  Actual D/C Date: 11/28/2013  Actual LOS: 2  Discharge Disposition: HOME/SELF CARE (Code 01)  160. LOC Notification, CMS Important Message / Detailed Notice  Created by : Dionne Milo Date/Time 2013-11-29 10:19:59.000  Medicare Important Message/Detailed Notice  Admission- CMS-Important Message from Medicare About Your Rights (CMS-1405) IMM was discussed via phone with pt's  representative. A copy was mailed.(Date/Time/Name/Ph#/Address) MAILED ON 11/28/2013

## 2013-11-30 ENCOUNTER — Telehealth (HOSPITAL_COMMUNITY): Payer: Self-pay | Admitting: Ophthalmology

## 2013-11-30 DIAGNOSIS — C679 Malignant neoplasm of bladder, unspecified: Secondary | ICD-10-CM

## 2013-11-30 DIAGNOSIS — R1900 Intra-abdominal and pelvic swelling, mass and lump, unspecified site: Secondary | ICD-10-CM

## 2013-11-30 NOTE — Telephone Encounter (Signed)
Called and spoke with Ms. Christy Hale. Told her about her pathology results and that we will schedule her an appointment with Surgical Oncology. She would like to be seen up here in [place]. We will schedule this for her.     Joon Pohle Janace Aris, MD 11/30/2013, 16:55  PGY-1 Transitional Year  Pager [phone]

## 2013-12-01 LAB — ADULT ROUTINE BLOOD CULTURE, SET OF 2 BOTTLES (BACTERIA AND YEAST)
CULTURE OBSERVATION: NO GROWTH
CULTURE OBSERVATION: NO GROWTH
SPECIAL REQUESTS: 1

## 2013-12-02 ENCOUNTER — Emergency Department (EMERGENCY_DEPARTMENT_HOSPITAL): Payer: Commercial Managed Care - PPO

## 2013-12-02 ENCOUNTER — Emergency Department (EMERGENCY_DEPARTMENT_HOSPITAL)
Admission: EM | Admit: 2013-12-02 | Discharge: 2013-12-02 | Payer: Commercial Managed Care - PPO | Attending: Emergency Medicine | Admitting: Emergency Medicine

## 2013-12-02 DIAGNOSIS — R1900 Intra-abdominal and pelvic swelling, mass and lump, unspecified site: Secondary | ICD-10-CM

## 2013-12-02 DIAGNOSIS — R1115 Cyclical vomiting syndrome unrelated to migraine: Secondary | ICD-10-CM

## 2013-12-04 MED ADMIN — dextrose 5 % and 0.45 % sodium chloride intravenous solution: @ 06:00:00 | NDC 00338008504

## 2013-12-06 ENCOUNTER — Ambulatory Visit (INDEPENDENT_AMBULATORY_CARE_PROVIDER_SITE_OTHER): Payer: Self-pay | Admitting: Surgery

## 2013-12-14 ENCOUNTER — Other Ambulatory Visit: Payer: Self-pay

## 2013-12-14 DIAGNOSIS — C44319 Basal cell carcinoma of skin of other parts of face: Secondary | ICD-10-CM | POA: Diagnosis not present

## 2013-12-14 DIAGNOSIS — L570 Actinic keratosis: Secondary | ICD-10-CM | POA: Diagnosis not present

## 2013-12-14 DIAGNOSIS — Z85828 Personal history of other malignant neoplasm of skin: Secondary | ICD-10-CM | POA: Diagnosis not present

## 2013-12-14 DIAGNOSIS — L821 Other seborrheic keratosis: Secondary | ICD-10-CM | POA: Diagnosis not present

## 2013-12-14 DIAGNOSIS — D239 Other benign neoplasm of skin, unspecified: Secondary | ICD-10-CM | POA: Diagnosis not present

## 2013-12-14 DIAGNOSIS — L739 Follicular disorder, unspecified: Secondary | ICD-10-CM | POA: Diagnosis not present

## 2013-12-14 DIAGNOSIS — Z85820 Personal history of malignant melanoma of skin: Secondary | ICD-10-CM | POA: Diagnosis not present

## 2013-12-19 ENCOUNTER — Ambulatory Visit (INDEPENDENT_AMBULATORY_CARE_PROVIDER_SITE_OTHER): Payer: Self-pay | Admitting: Surgery

## 2013-12-21 DIAGNOSIS — M2040 Other hammer toe(s) (acquired), unspecified foot: Secondary | ICD-10-CM | POA: Diagnosis not present

## 2013-12-21 DIAGNOSIS — M722 Plantar fascial fibromatosis: Secondary | ICD-10-CM | POA: Diagnosis not present

## 2013-12-22 DIAGNOSIS — Z85820 Personal history of malignant melanoma of skin: Secondary | ICD-10-CM | POA: Diagnosis not present

## 2013-12-22 DIAGNOSIS — C44319 Basal cell carcinoma of skin of other parts of face: Secondary | ICD-10-CM | POA: Diagnosis not present

## 2013-12-22 DIAGNOSIS — Z85828 Personal history of other malignant neoplasm of skin: Secondary | ICD-10-CM | POA: Diagnosis not present

## 2013-12-26 ENCOUNTER — Emergency Department (EMERGENCY_DEPARTMENT_HOSPITAL): Payer: Commercial Managed Care - PPO

## 2013-12-26 ENCOUNTER — Emergency Department (EMERGENCY_DEPARTMENT_HOSPITAL): Admission: EM | Admit: 2013-12-26 | Discharge: 2013-12-26 | Payer: Commercial Managed Care - PPO

## 2013-12-26 DIAGNOSIS — D481 Neoplasm of uncertain behavior of connective and other soft tissue: Secondary | ICD-10-CM

## 2013-12-26 DIAGNOSIS — K922 Gastrointestinal hemorrhage, unspecified: Secondary | ICD-10-CM

## 2013-12-26 DIAGNOSIS — K648 Other hemorrhoids: Secondary | ICD-10-CM

## 2013-12-26 DIAGNOSIS — K5730 Diverticulosis of large intestine without perforation or abscess without bleeding: Secondary | ICD-10-CM

## 2013-12-27 ENCOUNTER — Telehealth (INDEPENDENT_AMBULATORY_CARE_PROVIDER_SITE_OTHER): Payer: Self-pay | Admitting: Surgery

## 2013-12-27 ENCOUNTER — Ambulatory Visit (INDEPENDENT_AMBULATORY_CARE_PROVIDER_SITE_OTHER): Payer: Self-pay | Admitting: Surgery

## 2013-12-27 NOTE — Telephone Encounter (Signed)
Late entry- continued to f/u with patient and family to reschedule appointment with Dr. Rogue Bussing or verify care of patient at outside facility.  Messages left with both daughter Jovita Gamma as well as Marquisa to contact Dr. Mertie Moores office @ [phone] directly for any assistance with their mother's care.  Per E Chart pt is currently hospitalized, provided in message that if patient or family required any assistance from Dr. Burke Keels office to please contact our office.    Unice Bailey, RN

## 2014-01-04 DIAGNOSIS — M722 Plantar fascial fibromatosis: Secondary | ICD-10-CM | POA: Diagnosis not present

## 2014-01-06 ENCOUNTER — Inpatient Hospital Stay (HOSPITAL_BASED_OUTPATIENT_CLINIC_OR_DEPARTMENT_OTHER)
Admission: EM | Admit: 2014-01-06 | Discharge: 2014-01-06 | Disposition: A | Discharge status: Home / Self Care | Payer: Commercial Managed Care - PPO

## 2014-01-06 DIAGNOSIS — D72829 Elevated white blood cell count, unspecified: Secondary | ICD-10-CM

## 2014-01-06 DIAGNOSIS — R7309 Other abnormal glucose: Secondary | ICD-10-CM

## 2014-01-06 DIAGNOSIS — R1110 Vomiting, unspecified: Secondary | ICD-10-CM

## 2014-01-10 ENCOUNTER — Encounter (INDEPENDENT_AMBULATORY_CARE_PROVIDER_SITE_OTHER): Payer: Commercial Managed Care - PPO | Admitting: Surgery

## 2014-01-10 DIAGNOSIS — Z09 Encounter for follow-up examination after completed treatment for conditions other than malignant neoplasm: Secondary | ICD-10-CM

## 2014-01-12 ENCOUNTER — Ambulatory Visit (INDEPENDENT_AMBULATORY_CARE_PROVIDER_SITE_OTHER): Payer: Self-pay | Admitting: Neurology

## 2014-01-14 ENCOUNTER — Inpatient Hospital Stay (HOSPITAL_BASED_OUTPATIENT_CLINIC_OR_DEPARTMENT_OTHER)
Admission: EM | Admit: 2014-01-14 | Discharge: 2014-01-14 | Disposition: A | Discharge status: Home / Self Care | Payer: Commercial Managed Care - PPO

## 2014-01-14 DIAGNOSIS — R112 Nausea with vomiting, unspecified: Secondary | ICD-10-CM

## 2014-01-15 DIAGNOSIS — K56609 Unspecified intestinal obstruction, unspecified as to partial versus complete obstruction: Secondary | ICD-10-CM

## 2014-01-15 DIAGNOSIS — R112 Nausea with vomiting, unspecified: Secondary | ICD-10-CM

## 2014-01-16 DIAGNOSIS — Z85528 Personal history of other malignant neoplasm of kidney: Secondary | ICD-10-CM

## 2014-01-16 DIAGNOSIS — N1330 Unspecified hydronephrosis: Secondary | ICD-10-CM

## 2014-01-16 DIAGNOSIS — Z905 Acquired absence of kidney: Secondary | ICD-10-CM

## 2014-01-18 DIAGNOSIS — R112 Nausea with vomiting, unspecified: Secondary | ICD-10-CM

## 2014-01-18 DIAGNOSIS — K56609 Unspecified intestinal obstruction, unspecified as to partial versus complete obstruction: Secondary | ICD-10-CM

## 2014-01-25 ENCOUNTER — Emergency Department (EMERGENCY_DEPARTMENT_HOSPITAL)
Admission: EM | Admit: 2014-01-25 | Discharge: 2014-01-26 | Payer: Commercial Managed Care - PPO | Attending: EMERGENCY MEDICINE | Admitting: EMERGENCY MEDICINE

## 2014-01-25 ENCOUNTER — Emergency Department (EMERGENCY_DEPARTMENT_HOSPITAL): Payer: Commercial Managed Care - PPO

## 2014-01-25 DIAGNOSIS — R1110 Vomiting, unspecified: Secondary | ICD-10-CM

## 2014-01-25 DIAGNOSIS — E871 Hypo-osmolality and hyponatremia: Secondary | ICD-10-CM

## 2014-01-31 ENCOUNTER — Emergency Department (EMERGENCY_DEPARTMENT_HOSPITAL)
Admission: EM | Admit: 2014-01-31 | Discharge: 2014-02-01 | Disposition: A | Discharge status: Home / Self Care | Payer: Commercial Managed Care - PPO | Attending: Emergency Medicine | Admitting: Emergency Medicine

## 2014-01-31 ENCOUNTER — Emergency Department (EMERGENCY_DEPARTMENT_HOSPITAL): Payer: Commercial Managed Care - PPO

## 2014-01-31 DIAGNOSIS — K922 Gastrointestinal hemorrhage, unspecified: Secondary | ICD-10-CM

## 2014-01-31 DIAGNOSIS — R1084 Generalized abdominal pain: Secondary | ICD-10-CM

## 2014-01-31 DIAGNOSIS — R112 Nausea with vomiting, unspecified: Secondary | ICD-10-CM

## 2014-01-31 DIAGNOSIS — R55 Syncope and collapse: Secondary | ICD-10-CM

## 2014-02-01 DIAGNOSIS — C7800 Secondary malignant neoplasm of unspecified lung: Secondary | ICD-10-CM

## 2014-02-01 DIAGNOSIS — K3184 Gastroparesis: Secondary | ICD-10-CM

## 2014-02-01 DIAGNOSIS — C787 Secondary malignant neoplasm of liver and intrahepatic bile duct: Secondary | ICD-10-CM

## 2014-02-04 DIAGNOSIS — R112 Nausea with vomiting, unspecified: Secondary | ICD-10-CM

## 2014-02-04 DIAGNOSIS — C679 Malignant neoplasm of bladder, unspecified: Secondary | ICD-10-CM

## 2014-02-07 ENCOUNTER — Inpatient Hospital Stay (HOSPITAL_BASED_OUTPATIENT_CLINIC_OR_DEPARTMENT_OTHER)
Admission: EM | Admit: 2014-02-07 | Discharge: 2014-02-07 | Disposition: A | Discharge status: Home / Self Care | Payer: Commercial Managed Care - PPO

## 2014-02-07 DIAGNOSIS — R748 Abnormal levels of other serum enzymes: Secondary | ICD-10-CM

## 2014-02-07 DIAGNOSIS — E876 Hypokalemia: Secondary | ICD-10-CM

## 2014-02-07 DIAGNOSIS — E871 Hypo-osmolality and hyponatremia: Secondary | ICD-10-CM

## 2014-02-07 DIAGNOSIS — R1110 Vomiting, unspecified: Secondary | ICD-10-CM

## 2014-02-07 DIAGNOSIS — E785 Hyperlipidemia, unspecified: Secondary | ICD-10-CM | POA: Diagnosis not present

## 2014-02-07 DIAGNOSIS — E781 Pure hyperglyceridemia: Secondary | ICD-10-CM | POA: Diagnosis not present

## 2014-02-07 DIAGNOSIS — R82998 Other abnormal findings in urine: Secondary | ICD-10-CM | POA: Diagnosis not present

## 2014-02-07 DIAGNOSIS — M949 Disorder of cartilage, unspecified: Secondary | ICD-10-CM | POA: Diagnosis not present

## 2014-02-07 DIAGNOSIS — E559 Vitamin D deficiency, unspecified: Secondary | ICD-10-CM | POA: Diagnosis not present

## 2014-02-07 DIAGNOSIS — R7301 Impaired fasting glucose: Secondary | ICD-10-CM | POA: Diagnosis not present

## 2014-02-07 DIAGNOSIS — R809 Proteinuria, unspecified: Secondary | ICD-10-CM | POA: Diagnosis not present

## 2014-02-07 DIAGNOSIS — M899 Disorder of bone, unspecified: Secondary | ICD-10-CM | POA: Diagnosis not present

## 2014-02-08 ENCOUNTER — Other Ambulatory Visit (HOSPITAL_COMMUNITY): Payer: Self-pay | Admitting: Gastroenterology

## 2014-02-08 ENCOUNTER — Encounter (INDEPENDENT_AMBULATORY_CARE_PROVIDER_SITE_OTHER): Payer: Self-pay | Admitting: Gastroenterology

## 2014-02-08 DIAGNOSIS — K209 Esophagitis, unspecified without bleeding: Secondary | ICD-10-CM

## 2014-02-08 DIAGNOSIS — K920 Hematemesis: Secondary | ICD-10-CM

## 2014-02-08 DIAGNOSIS — K449 Diaphragmatic hernia without obstruction or gangrene: Secondary | ICD-10-CM

## 2014-02-08 DIAGNOSIS — D131 Benign neoplasm of stomach: Secondary | ICD-10-CM

## 2014-02-08 NOTE — Progress Notes (Signed)
Gastroenterology Consultation    [facility]  Department of Gastroenterology  [place], [place] [address]    Primary Care Physician:  Timmie Foerster, DO    Referring Physician:  Lewayne Bunting    Date pt seen: 02/08/2014 time: 945 am  Name: Christy Hale  Age: 73 y.o.    Chief Complaint: hematemesis    History of Present Illness  Location of complaint: generalized  Quality of discomfort/pain: no pain, just hemetemesis  Severity: none now  Duration: episode lasts < 3 days  Timing: intermittent  Context:  Started approximately 12 hours after prior d/c from hospital.   Modifying: nothing  Associated: No change in stools      Past Medical History  Past Medical History   Diagnosis Date    Hypertension     Ulcer disease     Lung cancer      left lung, unknown type    Arthritis     Diabetes mellitus     Hyperlipidemia     History of transfusion      no reaction    GERD (gastroesophageal reflux disease)      hx of, no longer takes meds    Kidney disease      s/p nephrectomy due to cancer    cancer      s/p renal cancer, lung cancer, skin cancer, and now with bladder cancer    Shortness of breath      with humidity    Seizures      x 3 2012 none since then    Muscle weakness     Urinary tract infection     Type 2 diabetes mellitus      dx 20 + yrs ago, fasting up to 311 low during night    Morbid obesity     H/O carcinoma of bladder     Congestive heart failure, unspecified     Thyroid disease     Past Surgical History   Procedure Laterality Date    Hx hysterectomy      Hx hand surgery       bilateral    Hx foot surgery       right    Hx other       left arm surgery    Hx nephrectomy       with ureterectomy (left) left nephrectomy    Hx other       left lung cancer removal    Hx laser eye surgery       laser OU for DM    Hx cataract removal  06/10/2012     CE IOL OD w/avastin    Hx cataract removal Left 07/13/12     CE IOL OS    Hx cystectomy  12/31/2010     radical  cystectomy     Hx tonsillectomy      Hx appendectomy      Hx gall bladder surgery/chole          Current Outpatient Prescriptions   Medication Sig    citalopram (CELEXA) 40 mg Oral Tablet Take 40 mg by mouth Once a day    cloNIDine (CATAPRES-TTS) 0.1 mg/24 hr Transdermal Patch Weekly     INSULIN GLARGINE,HUM.REC.ANLOG (LANTUS SUBQ) 30 Units by Subcutaneous route Every night     INSULIN LISPRO (HUMALOG SUBQ) by Subcutaneous route Twice daily On a sliding scale  Takes 22 to 28 units on sliding scale    levothyroxine (  SYNTHROID) 25 mcg Oral Tablet Take 25 mcg by mouth Once a day     metoclopramide HCl (REGLAN) 5 mg Oral Tablet 5 mg Three times daily before meals     omeprazole (PRILOSEC) 20 mg Oral Capsule, Delayed Release(E.C.) Take 20 mg by mouth Twice daily     promethazine (PHENERGAN) 25 mg Oral Tablet Take 25 mg by mouth Three times a day as needed    sennosides-docusate sodium (SENOKOT-S) 8.6-50 mg Oral Tablet take 1 Tab by mouth QPM.    simvastatin (ZOCOR) 40 mg Oral Tablet 40 mg Once a day     traMADol (ULTRAM) 50 mg Oral Tablet Three times a day as needed One tab at bedtime     Allergies   Allergen Reactions    Albuterol      Pt she had muscle spasms and a rash    Glucophage [Metformin]        Family History  Family History   Problem Relation Age of Onset    Diabetes Mother     Diabetes Father     Hypertension Mother     Hypertension Father     Coronary Artery Disease Mother     Lung Cancer Sister      smoker       Social History  History     Social History    Marital Status: Married     Spouse Name: N/A     Number of Children: N/A    Years of Education: N/A     Occupational History    Homemaker      Social History Main Topics    Smoking status: Former Smoker -- 3.00 packs/day for 15 years     Types: Cigarettes     Quit date: 10/20/1981    Smokeless tobacco: Never Used    Alcohol Use: No      Comment: wine rarely    Drug Use: No    Sexual Activity: Not on file     Other Topics  Concern    Ability To Walk 1 Flight Of Steps Without Sob/Cp No     Gets SOB with stairs no cp    Routine Exercise No    Ability To Do Own Adl's Yes     has help with household chores, minimal daily activity, denies cp/sob    Other Activity Level Yes     PT 2 times per week     Social History Narrative    No narrative on file         Review of Systems  Constitutional: negative for fevers, chills, sweats, fatigue, malaise and anorexia  Eyes: negative for visual disturbance and icterus  Respiratory: negative for cough or dyspnea on exertion  Cardiovascular: negative for chest pain, palpitations and syncope  Gastrointestinal: negative except for that listed above  Genitourinary:negative for frequency, dysuria, nocturia and urinary incontinence  Hematologic/lymphatic: negative for easy bruising, bleeding, lymphadenopathy and petechiae  Musculoskeletal:negative except for myalgias and arthralgias  Neurological: negative for seizures, tremor and weakness  Behavioral/Psych: negative for anxiety and depression    Examination:  Vitals:  100% O2, Afebrile, 75P, 18R, 189/86  General: appears chronically ill  Eyes: Conjunctiva clear., Pupils equal and round. , Sclera non-icteric.   HENT:Head atraumatic and normocephalic  Neck: No JVD  Lungs: Clear to auscultation bilaterally.   Cardiovascular: regular rate and rhythm     systolic murmur  Abdomen: Soft, non-tender, Bowel sounds normal, non-distended  Extremities: No cyanosis or edema  Skin:  Skin warm and dry  Neurologic: Grossly normal, Alert and oriented x3  Psychiatric: Normal affect, behavior, memory, thought content, judgement, and speech.    Data/Chart reviewed:  Previous labs reviewed?  yes , anemia. PLT ok  Previous imaging reviewed?  yes - KUB, NAP  Previous biopsy and pathology/EKG/outside records?  yes - OSH ER records  Images personally reviewed?  no  My interpretation:  n/a    Assessment and Plan  Christy Hale is a 73 y.o. year old female who presents with  hematemesis.     Continue PPI.  EGD now.    Risks/Benefits of procedure discussed with patient?  yes  The risks are as follows: The risks/benefits of upper endoscopy were discussed with the patient.  Alternative to endoscopy were also discussed.  The benefits include detection and removal of pre-cancerous lesions, control of bleeding, and diagnosis of upper digestive pathology.  The risks include perforation (creating a tear in the wall of the lumen).  The risk of perforation in uncomplicated patients is approximately 1:10,000.  Higher risk patients include those on dialysis, >21 years old, and critically ill inpatients.  Sometimes this can be treated conservatively, but, other times this requires surgery to fix.  Other risks can include bleeding, especially with the removal of lesions.  There is a small risk of infection post procedure.  In addition, there is a risk of missing lesions.  There is also a risk of adverse reaction to the anesthetic, including cardiopulmonary compromise. The risks were discussed in plain [place].       Ted Mcalpine, DO, [facility] - Gastroenterology  [place], [place]  [address]

## 2014-02-09 ENCOUNTER — Other Ambulatory Visit (INDEPENDENT_AMBULATORY_CARE_PROVIDER_SITE_OTHER): Payer: Self-pay | Admitting: Gastroenterology

## 2014-02-13 ENCOUNTER — Other Ambulatory Visit (INDEPENDENT_AMBULATORY_CARE_PROVIDER_SITE_OTHER): Payer: Self-pay | Admitting: Gastroenterology

## 2014-02-13 DIAGNOSIS — K209 Esophagitis, unspecified without bleeding: Secondary | ICD-10-CM

## 2014-02-13 DIAGNOSIS — K317 Polyp of stomach and duodenum: Secondary | ICD-10-CM

## 2014-02-14 DIAGNOSIS — E785 Hyperlipidemia, unspecified: Secondary | ICD-10-CM | POA: Diagnosis not present

## 2014-02-14 DIAGNOSIS — E781 Pure hyperglyceridemia: Secondary | ICD-10-CM | POA: Diagnosis not present

## 2014-02-14 DIAGNOSIS — M722 Plantar fascial fibromatosis: Secondary | ICD-10-CM | POA: Diagnosis not present

## 2014-02-14 DIAGNOSIS — Z1212 Encounter for screening for malignant neoplasm of rectum: Secondary | ICD-10-CM | POA: Diagnosis not present

## 2014-02-14 DIAGNOSIS — E559 Vitamin D deficiency, unspecified: Secondary | ICD-10-CM | POA: Diagnosis not present

## 2014-02-14 DIAGNOSIS — M659 Synovitis and tenosynovitis, unspecified: Secondary | ICD-10-CM | POA: Diagnosis not present

## 2014-02-14 DIAGNOSIS — M949 Disorder of cartilage, unspecified: Secondary | ICD-10-CM | POA: Diagnosis not present

## 2014-02-14 DIAGNOSIS — E669 Obesity, unspecified: Secondary | ICD-10-CM | POA: Diagnosis not present

## 2014-02-14 DIAGNOSIS — R7301 Impaired fasting glucose: Secondary | ICD-10-CM | POA: Diagnosis not present

## 2014-02-14 DIAGNOSIS — M25579 Pain in unspecified ankle and joints of unspecified foot: Secondary | ICD-10-CM | POA: Diagnosis not present

## 2014-02-14 DIAGNOSIS — Z0000 Encounter for general adult medical examination without abnormal findings: Secondary | ICD-10-CM | POA: Diagnosis not present

## 2014-02-14 DIAGNOSIS — Z23 Encounter for immunization: Secondary | ICD-10-CM | POA: Diagnosis not present

## 2014-02-14 DIAGNOSIS — M899 Disorder of bone, unspecified: Secondary | ICD-10-CM | POA: Diagnosis not present

## 2014-02-14 DIAGNOSIS — M76829 Posterior tibial tendinitis, unspecified leg: Secondary | ICD-10-CM | POA: Diagnosis not present

## 2014-02-15 ENCOUNTER — Other Ambulatory Visit (INDEPENDENT_AMBULATORY_CARE_PROVIDER_SITE_OTHER): Payer: Self-pay

## 2014-02-17 ENCOUNTER — Telehealth (INDEPENDENT_AMBULATORY_CARE_PROVIDER_SITE_OTHER): Payer: Self-pay | Admitting: Gastroenterology

## 2014-02-17 NOTE — Telephone Encounter (Signed)
LM--  Dr. Laurey Arrow sched a f/u EGD on 04/11/14 @ 1230.  Pt is to be NPO after midnight and arrive 1 hr and 15 mins early.

## 2014-02-17 NOTE — Telephone Encounter (Signed)
LM-    Dr. Laurey Arrow wants pt to have a 8 wk f/u EGD which is sched for 04/11/14 @ 12 pm.   Pt needs to arrive 1 hr and 15 mins early and NPO after midnight.  Info is being mailed to her address in chart.  LM for pt to return my phone call to discuss this with her.  She does not need a f/u office appt at this time per Dr. Laurey Arrow.

## 2014-03-01 DIAGNOSIS — M76829 Posterior tibial tendinitis, unspecified leg: Secondary | ICD-10-CM | POA: Diagnosis not present

## 2014-03-01 DIAGNOSIS — M659 Synovitis and tenosynovitis, unspecified: Secondary | ICD-10-CM | POA: Diagnosis not present

## 2014-03-01 DIAGNOSIS — M25579 Pain in unspecified ankle and joints of unspecified foot: Secondary | ICD-10-CM | POA: Diagnosis not present

## 2014-03-01 DIAGNOSIS — M722 Plantar fascial fibromatosis: Secondary | ICD-10-CM | POA: Diagnosis not present

## 2014-03-04 ENCOUNTER — Inpatient Hospital Stay (HOSPITAL_BASED_OUTPATIENT_CLINIC_OR_DEPARTMENT_OTHER)
Admission: EM | Admit: 2014-03-04 | Discharge: 2014-03-04 | Disposition: A | Discharge status: Home / Self Care | Payer: Commercial Managed Care - PPO

## 2014-03-04 DIAGNOSIS — I4891 Unspecified atrial fibrillation: Secondary | ICD-10-CM

## 2014-03-04 DIAGNOSIS — N390 Urinary tract infection, site not specified: Secondary | ICD-10-CM

## 2014-03-04 DIAGNOSIS — R1110 Vomiting, unspecified: Secondary | ICD-10-CM

## 2014-03-10 ENCOUNTER — Inpatient Hospital Stay (HOSPITAL_BASED_OUTPATIENT_CLINIC_OR_DEPARTMENT_OTHER)
Admission: EM | Admit: 2014-03-10 | Discharge: 2014-03-10 | Disposition: A | Discharge status: Home / Self Care | Payer: Commercial Managed Care - PPO

## 2014-03-10 DIAGNOSIS — R748 Abnormal levels of other serum enzymes: Secondary | ICD-10-CM

## 2014-03-10 DIAGNOSIS — B373 Candidiasis of vulva and vagina: Secondary | ICD-10-CM

## 2014-03-10 DIAGNOSIS — B3731 Acute candidiasis of vulva and vagina: Secondary | ICD-10-CM

## 2014-03-10 DIAGNOSIS — R1110 Vomiting, unspecified: Secondary | ICD-10-CM

## 2014-03-10 DIAGNOSIS — E876 Hypokalemia: Secondary | ICD-10-CM

## 2014-03-19 ENCOUNTER — Inpatient Hospital Stay (HOSPITAL_BASED_OUTPATIENT_CLINIC_OR_DEPARTMENT_OTHER)
Admission: EM | Admit: 2014-03-19 | Discharge: 2014-03-19 | Disposition: A | Discharge status: Home / Self Care | Payer: Commercial Managed Care - PPO

## 2014-03-19 DIAGNOSIS — D696 Thrombocytopenia, unspecified: Secondary | ICD-10-CM

## 2014-03-19 DIAGNOSIS — E161 Other hypoglycemia: Secondary | ICD-10-CM

## 2014-03-19 DIAGNOSIS — I491 Atrial premature depolarization: Secondary | ICD-10-CM

## 2014-03-19 DIAGNOSIS — N390 Urinary tract infection, site not specified: Secondary | ICD-10-CM

## 2014-03-21 ENCOUNTER — Inpatient Hospital Stay (HOSPITAL_BASED_OUTPATIENT_CLINIC_OR_DEPARTMENT_OTHER)
Admission: EM | Admit: 2014-03-21 | Discharge: 2014-03-21 | Disposition: A | Discharge status: Home / Self Care | Payer: Commercial Managed Care - PPO

## 2014-03-21 DIAGNOSIS — E871 Hypo-osmolality and hyponatremia: Secondary | ICD-10-CM

## 2014-03-21 DIAGNOSIS — R4182 Altered mental status, unspecified: Secondary | ICD-10-CM

## 2014-03-21 DIAGNOSIS — N390 Urinary tract infection, site not specified: Secondary | ICD-10-CM

## 2014-03-21 DIAGNOSIS — R1110 Vomiting, unspecified: Secondary | ICD-10-CM

## 2014-03-21 DIAGNOSIS — N289 Disorder of kidney and ureter, unspecified: Secondary | ICD-10-CM

## 2014-03-21 DIAGNOSIS — D696 Thrombocytopenia, unspecified: Secondary | ICD-10-CM

## 2014-04-06 ENCOUNTER — Telehealth (INDEPENDENT_AMBULATORY_CARE_PROVIDER_SITE_OTHER): Payer: Self-pay | Admitting: Gastroenterology

## 2014-04-06 NOTE — Telephone Encounter (Signed)
Labs drawn  03/30/14, abnormals were Platelet count 11, Hgb 7.5

## 2014-04-10 ENCOUNTER — Telehealth (INDEPENDENT_AMBULATORY_CARE_PROVIDER_SITE_OTHER): Payer: Self-pay | Admitting: Gastroenterology

## 2014-04-10 NOTE — Telephone Encounter (Signed)
Spoke to pt to confirm procedure tomorrow at 12 noon. I informed her she needs to arrive 1 hr and 15 mins early. There was a pause on the phone. I asked her if she was keeping her appt and she replied "as far as i know".  I instructed pt to call to r/s if she feels she cannot keep the appt. She states she would.

## 2014-04-11 ENCOUNTER — Telehealth (INDEPENDENT_AMBULATORY_CARE_PROVIDER_SITE_OTHER): Payer: Self-pay | Admitting: Gastroenterology

## 2014-04-11 DIAGNOSIS — R82998 Other abnormal findings in urine: Secondary | ICD-10-CM | POA: Diagnosis not present

## 2014-04-11 DIAGNOSIS — M76829 Posterior tibial tendinitis, unspecified leg: Secondary | ICD-10-CM | POA: Diagnosis not present

## 2014-04-11 NOTE — Telephone Encounter (Signed)
The patient was to have labs drawn at [facility] today. She refused to let the out patient nurse draw the blood, the lab order is at the lab but the patient has not shown up to have it drawn.

## 2014-04-14 NOTE — Telephone Encounter (Signed)
Repeat Plt count is 167.  The 11 was an error.  Pt ate on her scheduled day of endoscopy.  It is ok to reschedule her for upper endoscopy at this time.

## 2014-04-17 NOTE — Telephone Encounter (Signed)
pts EGD is r/s to 05/09/14 at 8:30 am.  Spoke to pts daughter and she is aware. She is also aware pt is not to eat after midnight.

## 2014-04-28 ENCOUNTER — Inpatient Hospital Stay (HOSPITAL_BASED_OUTPATIENT_CLINIC_OR_DEPARTMENT_OTHER)
Admission: EM | Admit: 2014-04-28 | Discharge: 2014-04-28 | Disposition: A | Discharge status: Home / Self Care | Payer: Commercial Managed Care - PPO

## 2014-04-28 DIAGNOSIS — E869 Volume depletion, unspecified: Secondary | ICD-10-CM

## 2014-04-28 DIAGNOSIS — R9431 Abnormal electrocardiogram [ECG] [EKG]: Secondary | ICD-10-CM

## 2014-04-28 DIAGNOSIS — E860 Dehydration: Secondary | ICD-10-CM

## 2014-05-05 ENCOUNTER — Telehealth (INDEPENDENT_AMBULATORY_CARE_PROVIDER_SITE_OTHER): Payer: Self-pay | Admitting: Gastroenterology

## 2014-05-05 NOTE — Telephone Encounter (Signed)
Spoke to [place], pts daughter.  Confirmed EGD for Tuesday.  Reminded pt to arrive 1 hr and 15 mins early, NPO after midnight, and a driver is needed.  If pt is unable to keep the appt, pt needs to cb to r/s.

## 2014-05-16 DIAGNOSIS — C679 Malignant neoplasm of bladder, unspecified: Secondary | ICD-10-CM

## 2014-05-16 DIAGNOSIS — K3184 Gastroparesis: Secondary | ICD-10-CM

## 2014-05-16 DIAGNOSIS — C787 Secondary malignant neoplasm of liver and intrahepatic bile duct: Secondary | ICD-10-CM

## 2014-05-16 DIAGNOSIS — K2970 Gastritis, unspecified, without bleeding: Secondary | ICD-10-CM

## 2014-05-16 DIAGNOSIS — K449 Diaphragmatic hernia without obstruction or gangrene: Secondary | ICD-10-CM

## 2014-05-16 DIAGNOSIS — K2100 Gastro-esophageal reflux disease with esophagitis, without bleeding: Secondary | ICD-10-CM

## 2014-05-16 DIAGNOSIS — Z01419 Encounter for gynecological examination (general) (routine) without abnormal findings: Secondary | ICD-10-CM | POA: Diagnosis not present

## 2014-05-16 DIAGNOSIS — Z1231 Encounter for screening mammogram for malignant neoplasm of breast: Secondary | ICD-10-CM | POA: Diagnosis not present

## 2014-05-23 ENCOUNTER — Other Ambulatory Visit (INDEPENDENT_AMBULATORY_CARE_PROVIDER_SITE_OTHER): Payer: Self-pay

## 2014-05-23 DIAGNOSIS — K317 Polyp of stomach and duodenum: Secondary | ICD-10-CM

## 2014-05-23 DIAGNOSIS — K209 Esophagitis, unspecified without bleeding: Secondary | ICD-10-CM

## 2014-05-26 ENCOUNTER — Encounter (INDEPENDENT_AMBULATORY_CARE_PROVIDER_SITE_OTHER): Payer: Self-pay | Admitting: Hematology & Oncology

## 2014-06-15 DIAGNOSIS — Z85828 Personal history of other malignant neoplasm of skin: Secondary | ICD-10-CM | POA: Diagnosis not present

## 2014-06-15 DIAGNOSIS — Z85820 Personal history of malignant melanoma of skin: Secondary | ICD-10-CM | POA: Diagnosis not present

## 2014-06-15 DIAGNOSIS — L819 Disorder of pigmentation, unspecified: Secondary | ICD-10-CM | POA: Diagnosis not present

## 2014-06-15 DIAGNOSIS — L821 Other seborrheic keratosis: Secondary | ICD-10-CM | POA: Diagnosis not present

## 2014-06-15 DIAGNOSIS — L739 Follicular disorder, unspecified: Secondary | ICD-10-CM | POA: Diagnosis not present

## 2014-08-23 DIAGNOSIS — Z23 Encounter for immunization: Secondary | ICD-10-CM | POA: Diagnosis not present

## 2014-09-02 ENCOUNTER — Inpatient Hospital Stay (HOSPITAL_BASED_OUTPATIENT_CLINIC_OR_DEPARTMENT_OTHER)
Admission: EM | Admit: 2014-09-02 | Discharge: 2014-09-02 | Disposition: A | Discharge status: Home / Self Care | Payer: Commercial Managed Care - PPO

## 2014-09-02 DIAGNOSIS — N390 Urinary tract infection, site not specified: Secondary | ICD-10-CM

## 2014-09-19 DEATH — deceased

## 2014-09-28 DIAGNOSIS — R002 Palpitations: Secondary | ICD-10-CM | POA: Diagnosis not present

## 2014-09-28 DIAGNOSIS — R42 Dizziness and giddiness: Secondary | ICD-10-CM | POA: Diagnosis not present

## 2014-09-29 DIAGNOSIS — R42 Dizziness and giddiness: Secondary | ICD-10-CM | POA: Diagnosis not present

## 2014-09-29 DIAGNOSIS — E668 Other obesity: Secondary | ICD-10-CM | POA: Diagnosis not present

## 2014-09-29 DIAGNOSIS — R002 Palpitations: Secondary | ICD-10-CM | POA: Diagnosis not present

## 2014-10-26 DIAGNOSIS — H6121 Impacted cerumen, right ear: Secondary | ICD-10-CM | POA: Diagnosis not present

## 2014-11-13 DIAGNOSIS — E668 Other obesity: Secondary | ICD-10-CM | POA: Diagnosis not present

## 2014-11-13 DIAGNOSIS — R002 Palpitations: Secondary | ICD-10-CM | POA: Diagnosis not present

## 2014-11-13 DIAGNOSIS — Z7901 Long term (current) use of anticoagulants: Secondary | ICD-10-CM | POA: Diagnosis not present

## 2014-11-13 DIAGNOSIS — R42 Dizziness and giddiness: Secondary | ICD-10-CM | POA: Diagnosis not present

## 2014-11-13 DIAGNOSIS — I480 Paroxysmal atrial fibrillation: Secondary | ICD-10-CM | POA: Diagnosis not present

## 2014-11-13 DIAGNOSIS — H8110 Benign paroxysmal vertigo, unspecified ear: Secondary | ICD-10-CM | POA: Diagnosis not present

## 2015-01-16 DIAGNOSIS — E668 Other obesity: Secondary | ICD-10-CM | POA: Diagnosis not present

## 2015-01-16 DIAGNOSIS — R42 Dizziness and giddiness: Secondary | ICD-10-CM | POA: Diagnosis not present

## 2015-01-16 DIAGNOSIS — I480 Paroxysmal atrial fibrillation: Secondary | ICD-10-CM | POA: Diagnosis not present

## 2015-01-16 DIAGNOSIS — Z7901 Long term (current) use of anticoagulants: Secondary | ICD-10-CM | POA: Diagnosis not present

## 2015-01-17 DIAGNOSIS — I480 Paroxysmal atrial fibrillation: Secondary | ICD-10-CM | POA: Diagnosis not present

## 2015-01-17 DIAGNOSIS — R55 Syncope and collapse: Secondary | ICD-10-CM | POA: Diagnosis not present

## 2015-01-17 DIAGNOSIS — R42 Dizziness and giddiness: Secondary | ICD-10-CM | POA: Diagnosis not present

## 2015-01-17 DIAGNOSIS — Z7901 Long term (current) use of anticoagulants: Secondary | ICD-10-CM | POA: Diagnosis not present

## 2015-01-17 DIAGNOSIS — E668 Other obesity: Secondary | ICD-10-CM | POA: Diagnosis not present

## 2015-01-22 DIAGNOSIS — E781 Pure hyperglyceridemia: Secondary | ICD-10-CM | POA: Diagnosis not present

## 2015-01-22 DIAGNOSIS — Z0000 Encounter for general adult medical examination without abnormal findings: Secondary | ICD-10-CM | POA: Diagnosis not present

## 2015-01-22 DIAGNOSIS — E559 Vitamin D deficiency, unspecified: Secondary | ICD-10-CM | POA: Diagnosis not present

## 2015-01-22 DIAGNOSIS — R7301 Impaired fasting glucose: Secondary | ICD-10-CM | POA: Diagnosis not present

## 2015-01-22 DIAGNOSIS — E785 Hyperlipidemia, unspecified: Secondary | ICD-10-CM | POA: Diagnosis not present

## 2015-02-20 DIAGNOSIS — Z0000 Encounter for general adult medical examination without abnormal findings: Secondary | ICD-10-CM | POA: Diagnosis not present

## 2015-02-20 DIAGNOSIS — M8580 Other specified disorders of bone density and structure, unspecified site: Secondary | ICD-10-CM | POA: Diagnosis not present

## 2015-02-20 DIAGNOSIS — Z7901 Long term (current) use of anticoagulants: Secondary | ICD-10-CM | POA: Diagnosis not present

## 2015-02-20 DIAGNOSIS — I480 Paroxysmal atrial fibrillation: Secondary | ICD-10-CM | POA: Diagnosis not present

## 2015-02-20 DIAGNOSIS — E785 Hyperlipidemia, unspecified: Secondary | ICD-10-CM | POA: Diagnosis not present

## 2015-02-20 DIAGNOSIS — Z1389 Encounter for screening for other disorder: Secondary | ICD-10-CM | POA: Diagnosis not present

## 2015-02-20 DIAGNOSIS — Z1212 Encounter for screening for malignant neoplasm of rectum: Secondary | ICD-10-CM | POA: Diagnosis not present

## 2015-02-20 DIAGNOSIS — E559 Vitamin D deficiency, unspecified: Secondary | ICD-10-CM | POA: Diagnosis not present

## 2015-02-20 DIAGNOSIS — E669 Obesity, unspecified: Secondary | ICD-10-CM | POA: Diagnosis not present

## 2015-02-20 DIAGNOSIS — R7301 Impaired fasting glucose: Secondary | ICD-10-CM | POA: Diagnosis not present

## 2015-02-20 DIAGNOSIS — E781 Pure hyperglyceridemia: Secondary | ICD-10-CM | POA: Diagnosis not present

## 2015-02-20 DIAGNOSIS — R42 Dizziness and giddiness: Secondary | ICD-10-CM | POA: Diagnosis not present

## 2015-02-20 DIAGNOSIS — Z6830 Body mass index (BMI) 30.0-30.9, adult: Secondary | ICD-10-CM | POA: Diagnosis not present

## 2015-02-20 DIAGNOSIS — E668 Other obesity: Secondary | ICD-10-CM | POA: Diagnosis not present

## 2015-03-13 DIAGNOSIS — Z85828 Personal history of other malignant neoplasm of skin: Secondary | ICD-10-CM | POA: Diagnosis not present

## 2015-03-13 DIAGNOSIS — L720 Epidermal cyst: Secondary | ICD-10-CM | POA: Diagnosis not present

## 2015-03-13 DIAGNOSIS — D2261 Melanocytic nevi of right upper limb, including shoulder: Secondary | ICD-10-CM | POA: Diagnosis not present

## 2015-03-13 DIAGNOSIS — L821 Other seborrheic keratosis: Secondary | ICD-10-CM | POA: Diagnosis not present

## 2015-03-13 DIAGNOSIS — Z85820 Personal history of malignant melanoma of skin: Secondary | ICD-10-CM | POA: Diagnosis not present

## 2015-05-12 ENCOUNTER — Telehealth: Payer: Self-pay | Admitting: Physician Assistant

## 2015-05-12 NOTE — Telephone Encounter (Signed)
74 yo female with hx of AFib followed by Dr. Viona Gilmore. Tollie Eth.  I do not have access to her office chart. She describes what sounds like paroxysmal AFib.   She is on Eliquis. No hx of CAD, CVA, HTN. She had nightmares on Metoprolol Succinate 25 Twice daily. She called the office yesterday and this was changed to Diltiazem ER 240 mg Once daily.  She took her first dose last night. Today, she feels dizzy, lightheaded, nauseated.  BP 126/75.  HR 62. She has significant symptoms when she goes into AFib.  She has not had any symptoms today. She denies recent fever, cough, melena, hematochezia, vomiting, diarrhea. I advised her to stop the Diltiazem, push oral fluids today and rest. If she feels worse, go to urgent care or the ED. Otherwise, she can take one dose of Metoprolol Succinate 25 mg tomorrow. I will have Dr. Thurman Coyer office call her Monday to sort out what what medication or dose to try next. She agrees with this plan. Signed,  Richardson Dopp, PA-C   05/12/2015 8:31 AM

## 2015-05-22 DIAGNOSIS — E785 Hyperlipidemia, unspecified: Secondary | ICD-10-CM | POA: Diagnosis not present

## 2015-05-22 DIAGNOSIS — E668 Other obesity: Secondary | ICD-10-CM | POA: Diagnosis not present

## 2015-05-22 DIAGNOSIS — Z7901 Long term (current) use of anticoagulants: Secondary | ICD-10-CM | POA: Diagnosis not present

## 2015-05-22 DIAGNOSIS — I480 Paroxysmal atrial fibrillation: Secondary | ICD-10-CM | POA: Diagnosis not present

## 2015-05-22 DIAGNOSIS — R42 Dizziness and giddiness: Secondary | ICD-10-CM | POA: Diagnosis not present

## 2015-05-24 DIAGNOSIS — N958 Other specified menopausal and perimenopausal disorders: Secondary | ICD-10-CM | POA: Diagnosis not present

## 2015-05-24 DIAGNOSIS — Z1231 Encounter for screening mammogram for malignant neoplasm of breast: Secondary | ICD-10-CM | POA: Diagnosis not present

## 2015-05-24 DIAGNOSIS — M8588 Other specified disorders of bone density and structure, other site: Secondary | ICD-10-CM | POA: Diagnosis not present

## 2015-05-29 DIAGNOSIS — M76822 Posterior tibial tendinitis, left leg: Secondary | ICD-10-CM | POA: Diagnosis not present

## 2015-05-29 DIAGNOSIS — M216X2 Other acquired deformities of left foot: Secondary | ICD-10-CM | POA: Diagnosis not present

## 2015-06-07 ENCOUNTER — Other Ambulatory Visit: Payer: Self-pay | Admitting: Obstetrics and Gynecology

## 2015-06-07 DIAGNOSIS — Z6831 Body mass index (BMI) 31.0-31.9, adult: Secondary | ICD-10-CM | POA: Diagnosis not present

## 2015-06-07 DIAGNOSIS — Z124 Encounter for screening for malignant neoplasm of cervix: Secondary | ICD-10-CM | POA: Diagnosis not present

## 2015-06-08 LAB — CYTOLOGY - PAP

## 2015-06-22 DIAGNOSIS — R42 Dizziness and giddiness: Secondary | ICD-10-CM | POA: Diagnosis not present

## 2015-06-22 DIAGNOSIS — E668 Other obesity: Secondary | ICD-10-CM | POA: Diagnosis not present

## 2015-06-22 DIAGNOSIS — I480 Paroxysmal atrial fibrillation: Secondary | ICD-10-CM | POA: Diagnosis not present

## 2015-06-22 DIAGNOSIS — Z7901 Long term (current) use of anticoagulants: Secondary | ICD-10-CM | POA: Diagnosis not present

## 2015-07-14 ENCOUNTER — Emergency Department (HOSPITAL_COMMUNITY): Payer: Medicare Other

## 2015-07-14 ENCOUNTER — Inpatient Hospital Stay (HOSPITAL_COMMUNITY)
Admission: EM | Admit: 2015-07-14 | Discharge: 2015-07-17 | DRG: 310 | Disposition: A | Discharge status: Home / Self Care | Payer: Medicare Other | Attending: Cardiology | Admitting: Cardiology

## 2015-07-14 ENCOUNTER — Telehealth: Payer: Self-pay | Admitting: Nurse Practitioner

## 2015-07-14 ENCOUNTER — Encounter (HOSPITAL_COMMUNITY): Payer: Self-pay | Admitting: Emergency Medicine

## 2015-07-14 DIAGNOSIS — Z87891 Personal history of nicotine dependence: Secondary | ICD-10-CM | POA: Diagnosis not present

## 2015-07-14 DIAGNOSIS — Z79899 Other long term (current) drug therapy: Secondary | ICD-10-CM | POA: Diagnosis not present

## 2015-07-14 DIAGNOSIS — Z23 Encounter for immunization: Secondary | ICD-10-CM | POA: Diagnosis not present

## 2015-07-14 DIAGNOSIS — E669 Obesity, unspecified: Secondary | ICD-10-CM | POA: Diagnosis present

## 2015-07-14 DIAGNOSIS — G4730 Sleep apnea, unspecified: Secondary | ICD-10-CM | POA: Diagnosis present

## 2015-07-14 DIAGNOSIS — E785 Hyperlipidemia, unspecified: Secondary | ICD-10-CM | POA: Diagnosis present

## 2015-07-14 DIAGNOSIS — R42 Dizziness and giddiness: Secondary | ICD-10-CM | POA: Diagnosis present

## 2015-07-14 DIAGNOSIS — R55 Syncope and collapse: Secondary | ICD-10-CM | POA: Diagnosis not present

## 2015-07-14 DIAGNOSIS — Z888 Allergy status to other drugs, medicaments and biological substances status: Secondary | ICD-10-CM | POA: Diagnosis not present

## 2015-07-14 DIAGNOSIS — Z85820 Personal history of malignant melanoma of skin: Secondary | ICD-10-CM | POA: Diagnosis not present

## 2015-07-14 DIAGNOSIS — Z7901 Long term (current) use of anticoagulants: Secondary | ICD-10-CM

## 2015-07-14 DIAGNOSIS — I480 Paroxysmal atrial fibrillation: Secondary | ICD-10-CM | POA: Diagnosis present

## 2015-07-14 DIAGNOSIS — I700 Atherosclerosis of aorta: Secondary | ICD-10-CM | POA: Diagnosis present

## 2015-07-14 DIAGNOSIS — Z6830 Body mass index (BMI) 30.0-30.9, adult: Secondary | ICD-10-CM

## 2015-07-14 DIAGNOSIS — R0602 Shortness of breath: Secondary | ICD-10-CM | POA: Diagnosis not present

## 2015-07-14 DIAGNOSIS — I4891 Unspecified atrial fibrillation: Secondary | ICD-10-CM | POA: Diagnosis not present

## 2015-07-14 HISTORY — DX: Obesity, unspecified: E66.9

## 2015-07-14 LAB — BASIC METABOLIC PANEL
Anion gap: 8 (ref 5–15)
BUN: 11 mg/dL (ref 6–20)
CHLORIDE: 105 mmol/L (ref 101–111)
CO2: 25 mmol/L (ref 22–32)
Calcium: 8.7 mg/dL — ABNORMAL LOW (ref 8.9–10.3)
Creatinine, Ser: 0.77 mg/dL (ref 0.44–1.00)
GFR calc non Af Amer: >60 mL/min (ref >60)
Glucose, Bld: 110 mg/dL — ABNORMAL HIGH (ref 65–99)
POTASSIUM: 4.2 mmol/L (ref 3.5–5.1)
SODIUM: 138 mmol/L (ref 135–145)

## 2015-07-14 LAB — CBC
HEMATOCRIT: 45.2 % (ref 36.0–46.0)
Hemoglobin: 15 g/dL (ref 12.0–15.0)
MCH: 31.8 pg (ref 26.0–34.0)
MCHC: 33.2 g/dL (ref 30.0–36.0)
MCV: 95.8 fL (ref 78.0–100.0)
PLATELETS: 245 10*3/uL (ref 150–400)
RBC: 4.72 MIL/uL (ref 3.87–5.11)
RDW: 13 % (ref 11.5–15.5)
WBC: 5.3 10*3/uL (ref 4.0–10.5)

## 2015-07-14 LAB — TROPONIN I: Troponin I: <0.03 ng/mL (ref <0.031)

## 2015-07-14 LAB — MAGNESIUM: MAGNESIUM: 1.9 mg/dL (ref 1.7–2.4)

## 2015-07-14 LAB — TSH: TSH: 2.491 u[IU]/mL (ref 0.350–4.500)

## 2015-07-14 MED ORDER — APIXABAN 5 MG PO TABS: 5.0000 mg | ORAL_TABLET | Freq: Two times a day (BID) | ORAL | Status: DC

## 2015-07-14 MED ORDER — NADOLOL 20 MG PO TABS
20.0000 mg | ORAL_TABLET | Freq: Every day | ORAL | Status: DC
  Administered 2015-07-14: 20 mg via ORAL
  Filled 2015-07-14 (×2): qty 1

## 2015-07-14 MED ORDER — ONDANSETRON HCL 4 MG/2ML IJ SOLN: 4.0000 mg | Freq: Four times a day (QID) | INTRAMUSCULAR | Status: DC | PRN

## 2015-07-14 MED ORDER — ACETAMINOPHEN 325 MG PO TABS: 650.0000 mg | ORAL_TABLET | ORAL | Status: DC | PRN

## 2015-07-14 MED ORDER — SODIUM CHLORIDE 0.9 % IJ SOLN
3.0000 mL | Freq: Two times a day (BID) | INTRAMUSCULAR | Status: DC
  Administered 2015-07-14 – 2015-07-16 (×6): 3 mL via INTRAVENOUS

## 2015-07-14 MED ORDER — APIXABAN 5 MG PO TABS
5.0000 mg | ORAL_TABLET | Freq: Two times a day (BID) | ORAL | Status: DC
  Administered 2015-07-14 – 2015-07-17 (×6): 5 mg via ORAL
  Filled 2015-07-14 (×6): qty 1

## 2015-07-14 MED ORDER — DOFETILIDE 500 MCG PO CAPS
500.0000 ug | ORAL_CAPSULE | Freq: Two times a day (BID) | ORAL | Status: DC
  Administered 2015-07-14 – 2015-07-17 (×6): 500 ug via ORAL
  Filled 2015-07-14 (×6): qty 1

## 2015-07-14 MED ORDER — ATORVASTATIN CALCIUM 20 MG PO TABS
20.0000 mg | ORAL_TABLET | Freq: Every day | ORAL | Status: DC
  Administered 2015-07-14: 20 mg via ORAL
  Filled 2015-07-14 (×2): qty 1

## 2015-07-14 MED ORDER — SODIUM CHLORIDE 0.9 % IV SOLN: 250.0000 mL | INTRAVENOUS | Status: DC | PRN

## 2015-07-14 MED ORDER — SODIUM CHLORIDE 0.9 % IJ SOLN: 3.0000 mL | INTRAMUSCULAR | Status: DC | PRN

## 2015-07-14 MED ORDER — VITAMIN D (ERGOCALCIFEROL) 1.25 MG (50000 UNIT) PO CAPS
50000.0000 [IU] | ORAL_CAPSULE | ORAL | Status: DC
  Filled 2015-07-14: qty 1

## 2015-07-14 NOTE — ED Notes (Signed)
Patient returned from X-ray 

## 2015-07-14 NOTE — H&P (Signed)
History and Physical   Admit date: 07/14/2015 Name:  Melinda Lucas Medical record number: [id number] DOB/Age:  06-19-1941  74 y.o. female  Referring Physician:   [facility]  Primary Cardiologist:  Dr. Wynonia Lawman  Primary Physician:   Dr. Reynaldo Minium  Chief complaint/reason for admission: Dizziness, atrial fibrillation  HPI:  This very nice 74 year old female started seeing me in December at which point in time she had a monitor for episodic palpitations and dizziness.  She was noted to have paroxysmal atrial fibrillation and was started on metoprolol and Eliquis a the time.  She did not have any significant bradycardia.  She began to complain of severe nightmares on the metoprolol and was changed to diltiazem but quit taking it after she developed numbness and tingling after one dose.  She later was changed to nadolol that helped with the nightmares.  She wore a monitor again in April showing continued episodes of paroxysmal atrial fibrillation.  Nadolol seems to help and she had been doing fine but would have some occasional episodes were she become weak and fall to her knees.  One episode occurred while she was at the dog training class and another episode occurred where she would become weak and describes tingling in her hands.  This morning she was coming back into the garage and felt tingling and numbness in grasped the banister and briefly lost consciousness and felt like her head was hitting the banister back and forth.  She was advised to come to the emergency room.  She currently is in atrial fibrillation in the emergency room.  She denies anginal type chest pains.  She denies PND, orthopnea or edema.  She has not missed any doses of anticoagulation.  She has not been tested for sleep apnea.  An echocardiogram in January showed mild to moderate LVH with an atrial size of 4.4 cm and mild mitral annular calcification.  EF was 55%.   Past Medical History  Diagnosis Date  . Melanoma  in situ of lower leg   . Obesity (BMI 30-39.9)      Past Surgical History  Procedure Laterality Date  . Melanoma excision  2010    left leg  . Wrist fracture surgery  2011   Allergies: metoprolol causes nightmares, diltiazem caused tingling    Medications: Prior to Admission medications   Medication Sig Start Date End Date Taking? Authorizing Provider  atorvastatin (LIPITOR) 20 MG tablet Take 20 mg by mouth daily. 07/07/15  Yes Historical Provider, MD  ELIQUIS 5 MG TABS tablet Take 5 mg by mouth 2 (two) times daily. 06/24/15  Yes Historical Provider, MD  nadolol (CORGARD) 20 MG tablet Take 20 mg by mouth daily. 06/16/15  Yes Historical Provider, MD  Vitamin D, Ergocalciferol, (DRISDOL) 50000 UNITS CAPS Take 50,000 Units by mouth every 7 (seven) days.   Yes Historical Provider, MD   Family History:  Family Status  Relation Status Death Age  . Mother Deceased 58    Colon cancer  . Father Deceased 93    Abdominal aneurysm  . Brother Alive     Hypertension  Social History:   reports that she has quit smoking. She has never used smokeless tobacco. She reports that she drinks about 3.5 oz of alcohol per week. She reports that she does not use illicit drugs.   Social History   Social History Narrative   Trains dogs currently, retired Cabin crew     Review of Systems:  Other than as noted above,  the remainder of the review of systems is normal  Physical Exam: BP 144/73 mmHg  Pulse 78  Temp(Src) 98.1 F (36.7 C) (Oral)  Resp 21  Ht 5\' 5"  (1.651 m)  Wt 81.647 kg (180 lb)  BMI 29.95 kg/m2  SpO2 96% General appearance: Pleasant female currently in no acute distress Head: Normocephalic, without obvious abnormality, atraumatic Eyes: conjunctivae/corneas clear. PERRL, EOM's intact. Fundi benign. Neck: no adenopathy, no carotid bruit, no JVD and supple, symmetrical, trachea midline Lungs: clear to auscultation bilaterally Heart: Irregular rhythm, normal S1-S2, no S3 or  murmur Abdomen: soft, non-tender; bowel sounds normal; no masses,  no organomegaly Rectal: deferred Extremities: Trace edema of the left ankle, mild venous changes noted.  No deformity Pulses: 2+ and symmetric Neurologic: Grossly normal   Labs: CBC  Recent Labs  07/14/15 1110  WBC 5.3  RBC 4.72  HGB 15.0  HCT 45.2  PLT 245  MCV 95.8  MCH 31.8  MCHC 33.2  RDW 13.0   CMP   Recent Labs  07/14/15 1110  NA 138  K 4.2  CL 105  CO2 25  GLUCOSE 110*  BUN 11  CREATININE 0.77  CALCIUM 8.7*  GFRNONAA >60  GFRAA >60   Cardiac Panel (last 3 results)  Recent Labs  07/14/15 1110  TROPONINI <0.03   Thyroid   EKG:  atrial fibrillation with controlled response   Radiology:  atherosclerotic changes in aorta, mild chronic changes and lungs   IMPRESSION: 1.  Symptomatic paroxysmal atrial fibrillation with intolerance to diltiazem and metoprolol 2.   Mild-to-moderate LVH with borderline blood pressure suggestive of occult hypertension 3.  Long-term use of anticoagulation 4.  Obesity 5.  Episodes of near syncope noted in past without documented bradycardia 6.  Hyperlipidemia under treatment 7.  Aortic atherosclerosis  PLAN:  she is anticoagulated and in atrial fibrillation.  Discussed with patient and husband.  Will admit to the hospital and in light of the continued episodes despite beta blockers go ahead and initiate therapy with Tikosyn under monitoring.  She will need a sleep study as an outpatient since she has not had one.  Consider EP consultation as if she fails Tikosyn would probably prefer to refer her for ablation.    Signed: Kerry Hough MD [facility]  07/14/2015, 1:35 PM

## 2015-07-14 NOTE — ED Notes (Signed)
Patient transported to X-ray 

## 2015-07-14 NOTE — ED Provider Notes (Signed)
CSN: [id number]     Arrival date & time 07/14/15  1037 History   First MD Initiated Contact with Patient 07/14/15 1051     Chief Complaint  Patient presents with  . Atrial Fibrillation  . Near Syncope     (Consider location/radiation/quality/duration/timing/severity/associated sxs/prior Treatment) HPI Comments: Patient here complaining of single episode prior to arrival. Does have a history of paroxysmal atrial fibrillation and did have palpitations this morning. Denies any chest pain or shortness of breath. No reported seizure activity. Denies any head trauma at this time. Did have some associated diaphoresis and skin tingling prior to this event. Denies any recent illnesses such as bloody stools or emesis or fever. Symptoms have resolved at this time and nothing makes them better. No treatment use prior to arrival  Patient is a 74 y.o. female presenting with atrial fibrillation and near-syncope. The history is provided by the patient and the spouse.  Atrial Fibrillation  Near Syncope    Past Medical History  Diagnosis Date  . Melanoma in situ of lower leg    Past Surgical History  Procedure Laterality Date  . Melanoma excision  2010    left leg  . Wrist fracture surgery  2011  . Colonoscopy     Family History  Problem Relation Age of Onset  . Colon cancer Mother 79   Social History  Substance Use Topics  . Smoking status: Former Research scientist (life sciences)  . Smokeless tobacco: Never Used     Comment: quit 40 years ago  . Alcohol Use: 3.5 oz/week    7 drink(s) per week     Comment: 1 drink daily wine or liquor per pt   OB History    No data available     Review of Systems  Cardiovascular: Positive for near-syncope.  All other systems reviewed and are negative.     Allergies  Review of patient's allergies indicates no known allergies.  Home Medications   Prior to Admission medications   Medication Sig Start Date End Date Taking? Authorizing Provider  Vitamin D,  Ergocalciferol, (DRISDOL) 50000 UNITS CAPS Take 50,000 Units by mouth every 7 (seven) days.    Historical Provider, MD   BP 151/83 mmHg  Pulse 91  Temp(Src) 98.1 F (36.7 C) (Oral)  Resp 19  Ht 5\' 5"  (1.651 m)  Wt 180 lb (81.647 kg)  BMI 29.95 kg/m2  SpO2 95% Physical Exam  Constitutional: She is oriented to person, place, and time. She appears well-developed and well-nourished.  Non-toxic appearance. No distress.  HENT:  Head: Normocephalic and atraumatic.  Eyes: Conjunctivae, EOM and lids are normal. Pupils are equal, round, and reactive to light.  Neck: Normal range of motion. Neck supple. No tracheal deviation present. No thyroid mass present.  Cardiovascular: Normal rate and normal heart sounds.  An irregularly irregular rhythm present. Exam reveals no gallop.   No murmur heard. Pulmonary/Chest: Effort normal and breath sounds normal. No stridor. No respiratory distress. She has no decreased breath sounds. She has no wheezes. She has no rhonchi. She has no rales.  Abdominal: Soft. Normal appearance and bowel sounds are normal. She exhibits no distension. There is no tenderness. There is no rebound and no CVA tenderness.  Musculoskeletal: Normal range of motion. She exhibits no edema or tenderness.  Neurological: She is alert and oriented to person, place, and time. She has normal strength. No cranial nerve deficit or sensory deficit. GCS eye subscore is 4. GCS verbal subscore is 5. GCS motor subscore is 6.  Skin: Skin is warm and dry. No abrasion and no rash noted.  Psychiatric: She has a normal mood and affect. Her speech is normal and behavior is normal.  Nursing note and vitals reviewed.   ED Course  Procedures (including critical care time) Labs Review Labs Reviewed  BASIC METABOLIC PANEL  CBC  TROPONIN I    Imaging Review No results found. I have personally reviewed and evaluated these images and lab results as part of my medical decision-making.   EKG  Interpretation   Date/Time:  Saturday July 14 2015 10:43:27 EDT Ventricular Rate:  77 PR Interval:    QRS Duration: 97 QT Interval:  387 QTC Calculation: 438 R Axis:   68 Text Interpretation:  Atrial fibrillation Low voltage, precordial leads  afib new from prior Confirmed by ALLEN  MD, ANTHONY ([id number]) on 07/14/2015  10:51:57 AM      MDM   Final diagnoses:  None    Pt to be admitted for syncope per cardiology    Lacretia Leigh, MD 07/17/15 1250

## 2015-07-14 NOTE — ED Notes (Signed)
Pt. Stated, I had 4 attacks of A-Fib.  The 1st one I think I passed out.  I was sweating, skin, tingling, and shallow breathing. This all started at 0930

## 2015-07-14 NOTE — Progress Notes (Signed)
Pharmacy Review for Dofetilide (Tikosyn) Initiation  Admit Complaint: 74 y.o. female admitted 07/14/2015 with atrial fibrillation to be initiated on dofetilide.   Assessment:  Patient Exclusion Criteria: If any screening criteria checked as "Yes", then  patient  should NOT receive dofetilide until criteria item is corrected. If "Yes" please indicate correction plan.  YES  NO Patient  Exclusion Criteria Correction Plan  []  [x]  Baseline QTc interval is greater than or equal to 440 msec. IF above YES box checked dofetilide contraindicated unless patient has ICD; then may proceed if QTc 500-550 msec or with known ventricular conduction abnormalities may proceed with QTc 550-600 msec. QTc =  438   []  [x]  Magnesium level is less than 1.8 mEq/l : Last magnesium: 1.9    []  [x]  Potassium level is less than 4 mEq/l : Last potassium:  Lab Results  Component Value Date   K 4.2 07/14/2015         []  [x]  Patient is known or suspected to have a digoxin level greater than 2 ng/ml: No results found for: DIGOXIN    []  [x]  Creatinine clearance less than 20 ml/min (calculated using Cockcroft-Gault, actual body weight and serum creatinine): Estimated Creatinine Clearance: 64.8 mL/min (by C-G formula based on Cr of 0.77).    []  [x]  Patient has received drugs known to prolong the QT intervals within the last 48 hours (phenothiazines, tricyclics or tetracyclic antidepressants, erythromycin, H-1 antihistamines, cisapride, fluoroquinolones, azithromycin). Drugs not listed above may have an, as yet, undetected potential to prolong the QT interval, updated information on QT prolonging agents is available at this website:QT prolonging agents   []  [x]  Patient received a dose of hydrochlorothiazide (Oretic) alone or in any combination including triamterene (Dyazide, Maxzide) in the last 48 hours.   []  [x]  Patient received a medication known to increase dofetilide plasma concentrations prior to initial dofetilide  dose:  . Trimethoprim (Primsol, Proloprim) in the last 36 hours . Verapamil (Calan, Verelan) in the last 36 hours or a sustained release dose in the last 72 hours . Megestrol (Megace) in the last 5 days  . Cimetidine (Tagamet) in the last 6 hours . Ketoconazole (Nizoral) in the last 24 hours . Itraconazole (Sporanox) in the last 48 hours  . Prochlorperazine (Compazine) in the last 36 hours    []  [x]  Patient is known to have a history of torsades de pointes; congenital or acquired long QT syndromes.   []  [x]  Patient has received a Class 1 antiarrhythmic with less than 2 half-lives since last dose. (Disopyramide, Quinidine, Procainamide, Lidocaine, Mexiletine, Flecainide, Propafenone)   []  [x]  Patient has received amiodarone therapy in the past 3 months or amiodarone level is greater than 0.3 ng/ml.    Patient has been appropriately anticoagulated with apixaban.  Ordering provider was confirmed at [email or web address] if they are not listed on the [place] Prescribers list.  Goal of Therapy: Follow renal function, electrolytes, potential drug interactions, and dose adjustment. Provide education and 1 week supply at discharge.  Plan:  [x]   Physician selected initial dose within range recommended for patients level of renal function - will monitor for response.  []   Physician selected initial dose outside of range recommended for patients level of renal function - will discuss if the dose should be altered at this time.   Select One Calculated CrCl  Dose q12h  [x]  > 60 ml/min 500 mcg  []  40-60 ml/min 250 mcg  []  20-40 ml/min 125 mcg   2. Follow up QTc  after the first 5 doses, renal function, electrolytes (K & Mg) daily x 3     days, dose adjustment, success of initiation and facilitate 1 week discharge supply as     clinically indicated.  3. Initiate Tikosyn education video (Call [phone] and ask for video # 116).  4. Place Enrollment Form on the chart for discharge supply of  dofetilide.   Wynell Balloon 3:48 PM 07/14/2015

## 2015-07-14 NOTE — Telephone Encounter (Signed)
   Pt called this AM to report that she has been having multiple episodes of tachypalpitations this AM.  The first was accompanied by severe LH and syncope.  She did fall, and believes she may have struck her head but does not think that she suffered any significant trauma (she is on eliquis).  She describes episodes as sudden tachypalpitations with dyspnea and lightheadedness, usually lasting just a few seconds.  She says that previous event monitoring showed AF during these spells.  Despite not currently having an episode, she cont to feel weak and dyspneic.  In light of syncope with possible head trauma on [place], I've recommended that she present to the ED for evaluation and observation.  Caller verbalized understanding and was grateful for the call back.  Murray Hodgkins, NP 07/14/2015, 10:22 AM

## 2015-07-15 LAB — BASIC METABOLIC PANEL
Anion gap: 9 (ref 5–15)
BUN: 12 mg/dL (ref 6–20)
CHLORIDE: 108 mmol/L (ref 101–111)
CO2: 23 mmol/L (ref 22–32)
CREATININE: 0.76 mg/dL (ref 0.44–1.00)
Calcium: 8.8 mg/dL — ABNORMAL LOW (ref 8.9–10.3)
GFR calc non Af Amer: >60 mL/min (ref >60)
Glucose, Bld: 108 mg/dL — ABNORMAL HIGH (ref 65–99)
Potassium: 4.3 mmol/L (ref 3.5–5.1)
Sodium: 140 mmol/L (ref 135–145)

## 2015-07-15 LAB — MAGNESIUM: Magnesium: 1.9 mg/dL (ref 1.7–2.4)

## 2015-07-15 MED ORDER — NADOLOL 20 MG PO TABS
20.0000 mg | ORAL_TABLET | Freq: Every day | ORAL | Status: DC
  Administered 2015-07-15: 20 mg via ORAL
  Filled 2015-07-15 (×3): qty 1

## 2015-07-15 MED ORDER — ATORVASTATIN CALCIUM 20 MG PO TABS
20.0000 mg | ORAL_TABLET | Freq: Every day | ORAL | Status: DC
  Administered 2015-07-15 – 2015-07-16 (×2): 20 mg via ORAL
  Filled 2015-07-15: qty 1

## 2015-07-15 MED ORDER — OFF THE BEAT BOOK
Freq: Once | Status: AC
  Administered 2015-07-15: 01:00:00
  Filled 2015-07-15: qty 1

## 2015-07-15 NOTE — Progress Notes (Signed)
Pt to receive 3rd Tikosyn dose 542mcg, Qtc 0.44, SB 53 on telemetry. MD on call paged to inform. Will continue to monitor. Jessie Foot, RN

## 2015-07-15 NOTE — Progress Notes (Signed)
Utilization review completed.  

## 2015-07-15 NOTE — Progress Notes (Signed)
Subjective:  Blood pressure little low this morning but feels fine.  Converted to sinus rhythm after first dose of Tikosyn last night.  Objective:  Vital Signs in the last 24 hours: BP 106/50 mmHg  Pulse 55  Temp(Src) 98.5 F (36.9 C) (Oral)  Resp 20  Ht 5\' 5"  (1.651 m)  Wt 83.371 kg (183 lb 12.8 oz)  BMI 30.59 kg/m2  SpO2 96%  Physical Exam: Pleasant female in no acute distress Lungs:  Clear  Cardiac:  Regular rhythm, normal S1 and S2, no S3 Extremities:  No edema present  Intake/Output from previous day: 09/24 0701 - 09/25 0700 In: 120 [P.O.:120] Out: -  Weight Filed Weights   07/14/15 1044 07/14/15 1512  Weight: 81.647 kg (180 lb) 83.371 kg (183 lb 12.8 oz)    Lab Results: Basic Metabolic Panel:  Recent Labs  07/14/15 1110 07/15/15 0533  NA 138 140  K 4.2 4.3  CL 105 108  CO2 25 23  GLUCOSE 110* 108*  BUN 11 12  CREATININE 0.77 0.76    CBC:  Recent Labs  07/14/15 1110  WBC 5.3  HGB 15.0  HCT 45.2  MCV 95.8  PLT 245   Telemetry: Sinus rhythm overnight  Assessment/Plan:  1.  Atrial fibrillation currently in sinus rhythm following initiation of Tikosyn 2.  Long-term use of anticoagulation  Recommendations:  Continue Tikosyn load under monitoring.  QTC is 0.46 this morning.  Monitor blood pressure and ambulate in hall.     Kerry Hough  MD [facility]  07/15/2015, 7:42 AM

## 2015-07-16 LAB — BASIC METABOLIC PANEL
ANION GAP: 6 (ref 5–15)
BUN: 10 mg/dL (ref 6–20)
CO2: 27 mmol/L (ref 22–32)
Calcium: 8.6 mg/dL — ABNORMAL LOW (ref 8.9–10.3)
Chloride: 107 mmol/L (ref 101–111)
Creatinine, Ser: 0.85 mg/dL (ref 0.44–1.00)
GFR calc Af Amer: >60 mL/min (ref >60)
Glucose, Bld: 104 mg/dL — ABNORMAL HIGH (ref 65–99)
POTASSIUM: 4.3 mmol/L (ref 3.5–5.1)
SODIUM: 140 mmol/L (ref 135–145)

## 2015-07-16 LAB — MAGNESIUM: MAGNESIUM: 1.9 mg/dL (ref 1.7–2.4)

## 2015-07-16 MED ORDER — NADOLOL 20 MG PO TABS
10.0000 mg | ORAL_TABLET | Freq: Every day | ORAL | Status: DC
  Administered 2015-07-16: 10 mg via ORAL
  Filled 2015-07-16 (×2): qty 1

## 2015-07-16 NOTE — Care Management Note (Addendum)
Case Management Note  Patient Details  Name: Melinda Lucas MRN: [id number] Date of Birth: 07/14/1941  Subjective/Objective: Pt admitted for Tikosyn Load. Pt uses [facility] in [place]. Medication [facility] ordered via [facility]                   Action/Plan: Benefits check in process and will make pt aware of cost once completed. Once stable for d/c pt will need a Rx for 7 day supply no refills and the original Rx with refills. CM to assist with the 7 day supply being filled by main pharmacy. No further needs from CM at this time.    Expected Discharge Date:                  Expected Discharge Plan:  Home/Self Care  In-House Referral:  NA  Discharge planning Services  CM Consult, Medication Assistance  Post Acute Care Choice:  NA Choice offered to:  NA  DME Arranged:  N/A DME Agency:  NA  HH Arranged:  NA HH Agency:  NA  Status of Service:     Medicare Important Message Given:    Date Medicare IM Given:    Medicare IM give by:    Date Additional Medicare IM Given:    Additional Medicare Important Message give by:     If discussed at Reform of Stay Meetings, dates discussed:    Additional Comments: 1618 07-16-15 Jacqlyn Krauss, RN,BSN [phone] Benefits check completed: Pt copay will be $222.97- generic will be $208.04- prior auth not required. Will make pt aware.   Bethena Roys, RN 07/16/2015, 11:23 AM

## 2015-07-16 NOTE — Progress Notes (Signed)
Subjective:  Blood pressure little low this morning but feels fine.  Remains in NSR.  Some bradycardia. QTC is OK.  Objective:  Vital Signs in the last 24 hours: BP 119/47 mmHg  Pulse 52  Temp(Src) 98.1 F (36.7 C) (Oral)  Resp 18  Ht 5\' 5"  (1.651 m)  Wt 82.963 kg (182 lb 14.4 oz)  BMI 30.44 kg/m2  SpO2 98%  Physical Exam: Pleasant female in no acute distress Lungs:  Clear  Cardiac:  Regular rhythm, normal S1 and S2, no S3 Extremities:  No edema present  Intake/Output from previous day: 09/25 0701 - 09/26 0700 In: 240 [P.O.:240] Out: -  Weight Filed Weights   07/14/15 1044 07/14/15 1512 07/16/15 0506  Weight: 81.647 kg (180 lb) 83.371 kg (183 lb 12.8 oz) 82.963 kg (182 lb 14.4 oz)    Lab Results: Basic Metabolic Panel:  Recent Labs  07/15/15 0533 07/16/15 0220  NA 140 140  K 4.3 4.3  CL 108 107  CO2 23 27  GLUCOSE 108* 104*  BUN 12 10  CREATININE 0.76 0.85    CBC:  Recent Labs  07/14/15 1110  WBC 5.3  HGB 15.0  HCT 45.2  MCV 95.8  PLT 245   Telemetry: Sinus rhythm overnight  Assessment/Plan:  1.  Atrial fibrillation currently in sinus rhythm following initiation of Tikosyn 2.  Long-term use of anticoagulation  Recommendations:  Continue Tikosyn load under monitoring.  QTC is 0.44 this morning.  Will reduce nadolol.   Kerry Hough  MD [facility] Cardiology  07/16/2015, 8:57 AM

## 2015-07-17 LAB — BASIC METABOLIC PANEL
Anion gap: 9 (ref 5–15)
BUN: 11 mg/dL (ref 6–20)
CHLORIDE: 104 mmol/L (ref 101–111)
CO2: 25 mmol/L (ref 22–32)
Calcium: 8.5 mg/dL — ABNORMAL LOW (ref 8.9–10.3)
Creatinine, Ser: 0.85 mg/dL (ref 0.44–1.00)
GFR calc Af Amer: >60 mL/min (ref >60)
GFR calc non Af Amer: >60 mL/min (ref >60)
GLUCOSE: 98 mg/dL (ref 65–99)
POTASSIUM: 4.2 mmol/L (ref 3.5–5.1)
Sodium: 138 mmol/L (ref 135–145)

## 2015-07-17 LAB — MAGNESIUM: Magnesium: 1.9 mg/dL (ref 1.7–2.4)

## 2015-07-17 MED ORDER — NADOLOL 20 MG PO TABS: 10.0000 mg | ORAL_TABLET | Freq: Every day | ORAL | Status: DC

## 2015-07-17 MED ORDER — DOFETILIDE 500 MCG PO CAPS: 500.0000 ug | ORAL_CAPSULE | Freq: Two times a day (BID) | ORAL | Status: DC

## 2015-07-17 NOTE — Care Management Important Message (Signed)
Important Message  Patient Details  Name: Melinda Lucas MRN: [id number] Date of Birth: 1941-10-02   Medicare Important Message Given:  Yes-second notification given    Nathen May 07/17/2015, 10:55 AM

## 2015-07-18 NOTE — Discharge Summary (Signed)
Physician Discharge Summary  Patient ID: Melinda Lucas MRN: [id number] DOB/AGE: 76-Sep-1942 76 y.o.  Admit date: 07/14/2015 Discharge date: 07/18/2015  Primary Physician:  Dr. Burnard Bunting   Primary Discharge Diagnosis:  1.  Paroxysmal atrial fibrillation-resolved  Secondary Discharge Diagnosis: 2.  Long-term use of anticoagulates 3.  Hyperlipidemia 4.  Aortic arch atherosclerosis 5.  Obesity  Hospital Course: This very nice 74 year old female started seeing me in December at which point in time she had a monitor for episodic palpitations and dizziness. She was noted to have paroxysmal atrial fibrillation and was started on metoprolol and Eliquis a the time. She did not have any significant bradycardia. She began to complain of severe nightmares on the metoprolol and was changed to diltiazem but quit taking it after she developed numbness and tingling after one dose. She later was changed to nadolol that helped with the nightmares. She wore a monitor again in April showing continued episodes of paroxysmal atrial fibrillation. Nadolol seems to help and she had been doing fine but would have some occasional episodes were she become weak and fall to her knees. One episode occurred while she was at the dog training class and another episode occurred where she would become weak and describes tingling in her hands. The morning of admission she was coming back into the garage and felt tingling and numbness in grasped the banister and briefly lost consciousness and felt like her head was hitting the banister back and forth. She was advised to come to the emergency room. She was in atrial fibrillation in the emergency room. She denies anginal type chest pains. She denies PND, orthopnea or edema. She has not missed any doses of anticoagulation. She has not been tested for sleep apnea. An echocardiogram in January showed mild to moderate LVH with an atrial size of 4.4 cm and mild mitral  annular calcification. EF was 55%.  The patient was continued on her anticoagulation.  Because of recurrent episodes of paroxysmal atrial fibrillation it was opted to initiate her on Tikosyn under monitoring.  Tikosyn was administered under monitoring and she converted to sinus rhythm after the first dose.  She did not have significant QT prolongation.  She had some mild bradycardia and her nadolol dose was reduced.  She was loaded on Tikosyn and after 6 doses was stable with a QTC of 0.46.  She is discharged at home in stable condition.  She will reduce her nadolol dose to 10 mg.  She will have an outpatient test for sleep apnea.  She was given instructions on the use of Tikosyn and also to avoid medicines that interact with it.  If she fails Tikosyn may be referred for ablation since she is so symptomatic.  Discharge Exam: Blood pressure 116/57, pulse 58, temperature 98.9 F (37.2 C), temperature source Oral, resp. rate 18, height 5\' 5"  (1.651 m), weight 82.419 kg (181 lb 11.2 oz), SpO2 98 %. Weight: 82.419 kg (181 lb 11.2 oz) Lungs clear no S3  Labs: CBC:   Lab Results  Component Value Date   WBC 5.3 07/14/2015   HGB 15.0 07/14/2015   HCT 45.2 07/14/2015   MCV 95.8 07/14/2015   PLT 245 07/14/2015    CMP:  Recent Labs Lab 07/17/15 0322  NA 138  K 4.2  CL 104  CO2 25  BUN 11  CREATININE 0.85  CALCIUM 8.5*  GLUCOSE 98   Thyroid: Lab Results  Component Value Date   TSH 2.491 07/14/2015    Radiology:  No acute cardiopulmonary disease, atherosclerotic calcifications of aorta  EKG: Normal sinus rhythm.  QTC is 0.46.  Discharge Medications:   Medication List    TAKE these medications        atorvastatin 20 MG tablet  Commonly known as:  LIPITOR  Take 20 mg by mouth daily.     dofetilide 500 MCG capsule  Commonly known as:  TIKOSYN  Take 1 capsule (500 mcg total) by mouth every 12 (twelve) hours.     ELIQUIS 5 MG Tabs tablet  Generic drug:  apixaban  Take 5 mg  by mouth 2 (two) times daily.     nadolol 20 MG tablet  Commonly known as:  CORGARD  Take 0.5 tablets (10 mg total) by mouth daily.     Vitamin D (Ergocalciferol) 50000 UNITS Caps capsule  Commonly known as:  DRISDOL  Take 50,000 Units by mouth every 7 (seven) days.       Followup plans and appointments: Follow-up with Dr. Wynonia Lawman in one week.  Time spent with patient to include physician time: 30 minutes  Signed: W. Doristine Church. MD [facility] 07/18/2015, 9:25 AM

## 2015-07-23 DIAGNOSIS — R55 Syncope and collapse: Secondary | ICD-10-CM | POA: Diagnosis not present

## 2015-07-23 DIAGNOSIS — R0683 Snoring: Secondary | ICD-10-CM | POA: Diagnosis not present

## 2015-07-23 DIAGNOSIS — Z7901 Long term (current) use of anticoagulants: Secondary | ICD-10-CM | POA: Diagnosis not present

## 2015-07-23 DIAGNOSIS — I480 Paroxysmal atrial fibrillation: Secondary | ICD-10-CM | POA: Diagnosis not present

## 2015-07-23 DIAGNOSIS — R42 Dizziness and giddiness: Secondary | ICD-10-CM | POA: Diagnosis not present

## 2015-07-23 DIAGNOSIS — R7301 Impaired fasting glucose: Secondary | ICD-10-CM | POA: Diagnosis not present

## 2015-07-24 ENCOUNTER — Encounter: Payer: Self-pay | Admitting: Cardiology

## 2015-07-24 ENCOUNTER — Encounter: Payer: Self-pay | Admitting: Internal Medicine

## 2015-07-24 DIAGNOSIS — I480 Paroxysmal atrial fibrillation: Secondary | ICD-10-CM | POA: Diagnosis not present

## 2015-07-27 NOTE — Progress Notes (Signed)
Patient ID: ARIAM MOL, female   DOB: 06-Mar-1941, 74 y.o.   MRN: [id number]   Melinda Lucas, Melinda Lucas  Date of visit:  07/24/2015 DOB:  [birth year]    Age:  74 yrs. Medical record number:  [id number]     Account number:  [id number] Primary Care Provider: Lutricia Feil ____________________________ CURRENT DIAGNOSES  1. Paroxysmal atrial fibrillation  2. Dizziness and giddiness  3. Obesity  4. Long term (current) use of anticoagulants ____________________________ ALLERGIES  Diltiazem, Weakness present  Metoprolol, Dream disorder ____________________________ MEDICATIONS  1. Vitamin D2 50,000 unit capsule, q 2 weeks  2. Eliquis 5 mg tablet, BID  3. atorvastatin 20 mg tablet, 1 p.o. daily  4. Tikosyn 500 mcg capsule, BID ____________________________ CHIEF COMPLAINTS  Followup of Paroxysmal atrial fibrillation ____________________________ HISTORY OF PRESENT ILLNESS Patient seen for cardiac followup. She was admitted to the hospital with recurrent atrial fibrillation. I decided to go ahead and suppress her with Tikosyn and she tolerated Tikosyn without issues. She was discharged home but had an episode yesterday where she felt as if her heart was beating somewhat fast and noted her pulse was 90. She awoke this morning and had an episode where she felt somewhat weak but did not have definite syncope. Her husband had to drive her to the office here today and she was found to have a junctional rhythm at a rate of 40. Occasional sinus beats were noted. She is really not feeling well at the present time but has not had definite syncope. ____________________________ PAST HISTORY  Past Medical Illnesses:  melanoma, obesity;  Cardiovascular Illnesses:  atrial fibrillation-paroxysmal;  Surgical Procedures:  melanoma excision, fx l wrist;  NYHA Classification:  I;  Canadian Angina Classification:  Class 0: Asymptomatic;  Cardiology Procedures-Invasive:  no history of prior cardiac procedures;  Cardiology  Procedures-Noninvasive:  event monitor December 2015, echocardiogram January 2016, event monitor March 2016;  LVEF of 60% documented via echocardiogram on 11/13/2014,   CHA2DS2-VASC Score:  3 ____________________________ CARDIO-PULMONARY TEST DATES EKG Date:  07/24/2015;  Holter/Event Monitor Date: 01/17/2015;  Echocardiography Date: 11/13/2014;  Chest Xray Date: 07/15/2015;   ____________________________ FAMILY HISTORY Brother -- Brother alive with problem, Hypertension, Hypercholesterolemia Father -- Father dead, Abdominal aortic aneurysm Mother -- Mother dead, Bowel cancer ____________________________ SOCIAL HISTORY Alcohol Use:  daily;  Smoking:  used to smoke but quit Prior to 1980, 10 pack year history;  Diet:  regular diet;  Lifestyle:  married;  Exercise:  dog training;  Occupation:  retired Advice worker;  Residence:  lives with husband;   ____________________________ REVIEW OF SYSTEMS General:  obesity Respiratory: denies dyspnea, cough, wheezing or hemoptysis. Cardiovascular:  please review HPI Abdominal: denies dyspepsia, GI bleeding, constipation, or diarrhea Musculoskeletal:  denies arthritis, venous insufficiency, or muscle weakness Psychiatric:  anxiety  ____________________________ PHYSICAL EXAMINATION VITAL SIGNS  Blood Pressure:  118/60 Sitting, Left arm, regular cuff  , 124/66 Standing, Left arm and regular cuff   Pulse:  48/min. Weight:  183.00 lbs. Height:  65"BMI: 30  Constitutional:  pleasant white female, in no acute distress, mildly obese Skin:  warm and dry to touch, no apparent skin lesions, or masses noted. Head:  normocephalic, normal hair pattern, no masses or tenderness Chest:  normal symmetry, clear to auscultation. Cardiac:  regular rhythm, normal S1 and S2, no S3 or S4, grade 1/6 systolic murmur Peripheral Pulses:  the femoral,dorsalis pedis, and posterior tibial pulses are full and equal bilaterally with no bruits auscultated. Neurological:  no gross motor  or sensory  deficits noted, affect appropriate, oriented x3. ____________________________ MOST RECENT LIPID PANEL 05/22/15  CHOL TOTL 131 mg/dl, LDL 64 NM, HDL 44 mg/dl and TRIGLYCER 101 mg/dl ____________________________ IMPRESSIONS/PLAN  1. Paroxysmal atrial fibrillation with junctional rhythm noted now on monitoring 2. Hypertension 3. Obesity  Recommendations:  Long discussion with patient and husband about atrial fibrillation. I have placed another event monitor on her and recommended stopping the beta blocker. She is to hold her Tikosyn tonight if her pulse is less than 60. QT was not prolonged today. I would like for her to have a sleep study for sleep apnea and we will go ahead and schedule a consultation with the atrial fibrillation specialist. Concern that she may have an element of tachycardia bradycardia syndrome. ____________________________ TODAYS ORDERS  1. 12 Lead EKG: Today  2.  Consult Dr. Rayann Heman: Schedule at convenience  3. [place]: Today  4. Return Visit: 1 month  5. Sleep Study with c-pap: At Patient Convenience                       ____________________________ Cardiology Physician:  Kerry Hough MD [facility]

## 2015-08-01 ENCOUNTER — Encounter: Payer: Self-pay | Admitting: Internal Medicine

## 2015-08-06 ENCOUNTER — Telehealth: Payer: Self-pay | Admitting: Internal Medicine

## 2015-08-06 ENCOUNTER — Ambulatory Visit (INDEPENDENT_AMBULATORY_CARE_PROVIDER_SITE_OTHER): Payer: Medicare Other | Admitting: Internal Medicine

## 2015-08-06 ENCOUNTER — Encounter: Payer: Self-pay | Admitting: Internal Medicine

## 2015-08-06 VITALS — BP 132/84 | HR 75 | Ht 65.0 in | Wt 187.4 lb

## 2015-08-06 DIAGNOSIS — I480 Paroxysmal atrial fibrillation: Secondary | ICD-10-CM | POA: Diagnosis not present

## 2015-08-06 DIAGNOSIS — I495 Sick sinus syndrome: Secondary | ICD-10-CM

## 2015-08-06 NOTE — Telephone Encounter (Signed)
New message     Talk to [facility]. Pt was seen today.  Pt states she has made a decision.  Please call

## 2015-08-06 NOTE — Progress Notes (Signed)
Electrophysiology Office Note   Date:  08/06/2015   ID:  Melinda Lucas, DOB 08-23-1941, MRN [id number]  PCP:  Marton Redwood, MD  Cardiologist:  Dr. Wynonia Lawman Primary Electrophysiologist: Dr. Rayann Heman    Chief Complaint  Patient presents with  . PAF     History of Present Illness: Melinda Lucas is a 74 y.o. female who presents today for electrophysiology evaluation.   She is accompanied by her husband today.  She reports her first known diagnosis of AFib about a year ago, she says she can definitely tell when she is Afib, feels very weak with onset, she is uncertain if she has any particular symptoms with conversion back to normal rhythm.  She states she cannot do her normal activities comfortably when in Afib, one particular activity is her dog training that she does routinely and cannot, feeling like it may provoke her Afib at least once and does not have the exertional capacity when in Afib.  She reports one Episode when she was in Afib that she was walking up some steps and woke when she hit her head on the railing, this prompted an ER visit and shortly afterwards is when she was started on Tikosyn by her primary cardiologist late September.  Since being on Tikosyn she has felt some episodes of AFib, once she felt very weak, and she happened to have an appt with Dr. Wynonia Lawman and his note states she was in junctional rhythm in the 40bpm range, the patient reports 32bpm and her Nadolol was stopped.  Notes also report that she has been intolerant to Cardizem and Metoprolol.  She is currently wearing an event monitor and is pending sleep study scheduled in December.  She has been referred for discussion about possible AFib ablation and after a call from her cardiologist with abnormal tracings a PPM possibly.   Today, she denies symptoms of palpitations, chest pain, shortness of breath, orthopnea, PND, lower extremity edema, claudication, dizziness, presyncope, syncope, bleeding, or neurologic  sequela. The patient is tolerating medications without difficulties and is otherwise without complaint today.    Past Medical History  Diagnosis Date  . Melanoma in situ of lower leg ([place])   . Obesity (BMI 30-39.9)   . Atrial fibrillation [facility])    Past Surgical History  Procedure Laterality Date  . Melanoma excision  2010    left leg  . Wrist fracture surgery  2011     Current Outpatient Prescriptions  Medication Sig Dispense Refill  . atorvastatin (LIPITOR) 20 MG tablet Take 20 mg by mouth daily.    . Coenzyme Q10 (CO Q 10 PO) Take 1 tablet by mouth daily.    Marland Kitchen dofetilide (TIKOSYN) 500 MCG capsule Take 1 capsule (500 mcg total) by mouth every 12 (twelve) hours. 60 capsule 12  . ELIQUIS 5 MG TABS tablet Take 5 mg by mouth 2 (two) times daily.  12  . TURMERIC PO Take 1 capsule by mouth daily.    . Vitamin D, Ergocalciferol, (DRISDOL) 50000 UNITS CAPS Take 50,000 Units by mouth every 14 (fourteen) days.      No current facility-administered medications for this visit.    Allergies:   Review of patient's allergies indicates no known allergies.   Social History:  The patient  reports that she has quit smoking. She has never used smokeless tobacco. She reports that she drinks about 3.5 oz of alcohol per week. She reports that she does not use illicit drugs.   Family History:  The patient's  family history includes AAA (abdominal aortic aneurysm) in her father; Colon cancer (age of onset: 25) in her mother; Hypertension in her brother.    ROS:  Please see the history of present illness.   All other systems are reviewed and negative.    PHYSICAL EXAM: VS:  BP 132/84 mmHg  Pulse 75  Ht 5\' 5"  (1.651 m)  Wt 187 lb 6.4 oz (85.004 kg)  BMI 31.18 kg/m2 , BMI Body mass index is 31.18 kg/(m^2). GEN: Well nourished, well developed, in no acute distress HEENT: normal Neck: no JVD, carotid bruits, or masses Cardiac: RRR; no significant murmurs, rubs, or gallops,no edema  Respiratory:   clear to auscultation bilaterally, normal work of breathing GI: soft, nontender, nondistended, + BS MS: no deformity or atrophy Skin: warm and dry  Neuro:  Strength and sensation are intact Psych: euthymic mood, full affect  EKG:  EKG is ordered today. The ekg ordered today shows SR   Recent Labs: 07/14/2015: Hemoglobin 15.0; Platelets 245; TSH 2.491 07/17/2015: BUN 11; Creatinine, Ser 0.85; Magnesium 1.9; Potassium 4.2; Sodium 138    Lipid Panel  No results found for: CHOL, TRIG, HDL, CHOLHDL, VLDL, LDLCALC, LDLDIRECT   Wt Readings from Last 3 Encounters:  08/06/15 187 lb 6.4 oz (85.004 kg)  07/17/15 181 lb 11.2 oz (82.419 kg)  12/09/12 189 lb (85.73 kg)      Other studies Reviewed: Additional studies/ records that were reviewed today include Dr. Maurice Small notes: Makes reference to an echocardiogram January with EF 55%, mild-mod LVH and atria size of 4.4cm, mild MAC Event Monitor tracings 07/24/15-current: SR, PAFib with rates up to 170, post termination pause of approximately 7seconds (at 11:30AM)    ASSESSMENT AND PLAN:  1.  PAFib      CHADS2Vasc is 3 on Eliquis and Tikosyn.   She has failed medical therapy with tikosyn.  She has ongoing paroxysms of afib Therapeutic strategies for afib including medicine and ablation were discussed in detail with the patient today. Risk, benefits, and alternatives to EP study and radiofrequency ablation for afib were also discussed in detail today. These risks include but are not limited to stroke, bleeding, vascular damage, tamponade, perforation, damage to the esophagus, lungs, and other structures, pulmonary vein stenosis, worsening renal function, and death. The patient understands these risk and wishes to proceed.  We will therefore proceed with catheter ablation at the next available time.  2. Sick sinus syndrome Post termination pauses documented on event monitor I have spoken with Dr Wynonia Lawman who hopes that post ablation these will  resolve. I have discussed options of leadless pacing, traditional pacing, or arrhythmia management with ablation as options going forward.  She is clear that she would prefer to avoid PPM at this time and would prefer an initial attempt at ablation. No driving until resolved. She will continue to wear her event monitor for 2 more weeks if additional pauses occur, we may need to consider a more urgent approach of pacing.  She would like to proceed with ablation procedure but would like Dr. Wynonia Lawman to be part of the conversation as well.  I have left Dr Wynonia Lawman a VM and will plan to speak with him soon.  Current medicines are reviewed at length with the patient today.   The patient does not have concerns regarding her medicines.  The following changes were made today:  none   Army Fossa MD 08/06/2015 5:14 PM     [place]  [address] [place] [address] [phone] (office) [phone] (fax)

## 2015-08-06 NOTE — Patient Instructions (Signed)
Medication Instructions:  Your physician recommends that you continue on your current medications as directed. Please refer to the Current Medication list given to you today.   Labwork: Your physician recommends that you return for lab work prior to the proceudre BMP/CBC   Testing/Procedures: Your physician has recommended that you have an ablation. Catheter ablation is a medical procedure used to treat some cardiac arrhythmias (irregular heartbeats). During catheter ablation, a long, thin, flexible tube is put into a blood vessel in your groin (upper thigh), or neck. This tube is called an ablation catheter. It is then guided to your heart through the blood vessel. Radio frequency waves destroy small areas of heart tissue where abnormal heartbeats may cause an arrhythmia to start. Please see the instruction sheet given to you today.    Follow-Up: Will call with plan after Dr Rayann Heman speaks with Dr Wynonia Lawman---   Any Other Special Instructions Will Be Listed Below (If Applicable).

## 2015-08-07 NOTE — Telephone Encounter (Signed)
Have spoken with patient and let her know i will work on getting her case scheduled for Melinda Lucas

## 2015-08-07 NOTE — Telephone Encounter (Signed)
Follow up   Pt is calling and she states that she is returning call for RN  She wants to know if Thursday was a good day for surgery; if not what is the next available date

## 2015-08-07 NOTE — Telephone Encounter (Signed)
Spoke with patient and let her know to be at the [facility] at 6:30am on Thurs 08/09/15.  NPO after midnight the night before and no medications  The TEE will be done by Dr Marlou Porch at 8:00am with the ablation to follow

## 2015-08-07 NOTE — Telephone Encounter (Signed)
Called the patient and left a message for her to call me back and I would give her the details for Thurs.

## 2015-08-08 ENCOUNTER — Telehealth: Payer: Self-pay | Admitting: Internal Medicine

## 2015-08-08 NOTE — Telephone Encounter (Signed)
New Message  Pt has ablation tomor 10/20, pt wanted to know what to do with her heart monitor before then. Please call back and discuss.

## 2015-08-08 NOTE — Telephone Encounter (Signed)
Spoke with pataient and let her know to call and discuss with Dr Wynonia Lawman but it will be fine to leave at home when she goes to the hospital for ablation

## 2015-08-09 ENCOUNTER — Ambulatory Visit (HOSPITAL_COMMUNITY): Payer: Medicare Other | Admitting: Anesthesiology

## 2015-08-09 ENCOUNTER — Ambulatory Visit (HOSPITAL_BASED_OUTPATIENT_CLINIC_OR_DEPARTMENT_OTHER)
Admission: RE | Admit: 2015-08-09 | Discharge: 2015-08-09 | Disposition: A | Discharge status: Home / Self Care | Payer: Medicare Other | Source: Ambulatory Visit | Attending: Nurse Practitioner | Admitting: Nurse Practitioner

## 2015-08-09 ENCOUNTER — Encounter (HOSPITAL_COMMUNITY): Payer: Self-pay

## 2015-08-09 ENCOUNTER — Ambulatory Visit (HOSPITAL_COMMUNITY)
Admission: RE | Admit: 2015-08-09 | Discharge: 2015-08-10 | Disposition: A | Discharge status: Home / Self Care | Payer: Medicare Other | Source: Ambulatory Visit | Attending: Cardiology | Admitting: Cardiology

## 2015-08-09 ENCOUNTER — Encounter (HOSPITAL_COMMUNITY)
Admission: RE | Disposition: A | Discharge status: Home / Self Care | Payer: Self-pay | Source: Ambulatory Visit | Attending: Cardiology

## 2015-08-09 DIAGNOSIS — Z6831 Body mass index (BMI) 31.0-31.9, adult: Secondary | ICD-10-CM | POA: Diagnosis not present

## 2015-08-09 DIAGNOSIS — I340 Nonrheumatic mitral (valve) insufficiency: Secondary | ICD-10-CM | POA: Diagnosis not present

## 2015-08-09 DIAGNOSIS — I495 Sick sinus syndrome: Secondary | ICD-10-CM | POA: Insufficient documentation

## 2015-08-09 DIAGNOSIS — I480 Paroxysmal atrial fibrillation: Secondary | ICD-10-CM | POA: Diagnosis present

## 2015-08-09 DIAGNOSIS — Z7901 Long term (current) use of anticoagulants: Secondary | ICD-10-CM | POA: Diagnosis not present

## 2015-08-09 DIAGNOSIS — Z79899 Other long term (current) drug therapy: Secondary | ICD-10-CM | POA: Insufficient documentation

## 2015-08-09 DIAGNOSIS — E785 Hyperlipidemia, unspecified: Secondary | ICD-10-CM | POA: Insufficient documentation

## 2015-08-09 DIAGNOSIS — Z87891 Personal history of nicotine dependence: Secondary | ICD-10-CM | POA: Diagnosis not present

## 2015-08-09 DIAGNOSIS — K219 Gastro-esophageal reflux disease without esophagitis: Secondary | ICD-10-CM | POA: Diagnosis not present

## 2015-08-09 DIAGNOSIS — I10 Essential (primary) hypertension: Secondary | ICD-10-CM | POA: Insufficient documentation

## 2015-08-09 DIAGNOSIS — E669 Obesity, unspecified: Secondary | ICD-10-CM | POA: Insufficient documentation

## 2015-08-09 DIAGNOSIS — M1990 Unspecified osteoarthritis, unspecified site: Secondary | ICD-10-CM | POA: Diagnosis not present

## 2015-08-09 DIAGNOSIS — I4891 Unspecified atrial fibrillation: Secondary | ICD-10-CM | POA: Diagnosis present

## 2015-08-09 HISTORY — PX: ELECTROPHYSIOLOGIC STUDY: SHX172A

## 2015-08-09 HISTORY — DX: Cardiac murmur, unspecified: R01.1

## 2015-08-09 HISTORY — PX: TEE WITHOUT CARDIOVERSION: SHX5443

## 2015-08-09 HISTORY — DX: Unspecified osteoarthritis, unspecified site: M19.90

## 2015-08-09 HISTORY — DX: Gastro-esophageal reflux disease without esophagitis: K21.9

## 2015-08-09 LAB — POCT ACTIVATED CLOTTING TIME
ACTIVATED CLOTTING TIME: 270 s
ACTIVATED CLOTTING TIME: 282 s
Activated Clotting Time: 183 seconds
Activated Clotting Time: 190 seconds

## 2015-08-09 LAB — CBC
HEMATOCRIT: 40.1 % (ref 36.0–46.0)
HEMOGLOBIN: 13.1 g/dL (ref 12.0–15.0)
MCH: 31.4 pg (ref 26.0–34.0)
MCHC: 32.7 g/dL (ref 30.0–36.0)
MCV: 96.2 fL (ref 78.0–100.0)
Platelets: 190 10*3/uL (ref 150–400)
RBC: 4.17 MIL/uL (ref 3.87–5.11)
RDW: 12.9 % (ref 11.5–15.5)
WBC: 4.3 10*3/uL (ref 4.0–10.5)

## 2015-08-09 LAB — BASIC METABOLIC PANEL
Anion gap: 6 (ref 5–15)
BUN: 10 mg/dL (ref 6–20)
CALCIUM: 8.5 mg/dL — AB (ref 8.9–10.3)
CHLORIDE: 109 mmol/L (ref 101–111)
CO2: 24 mmol/L (ref 22–32)
CREATININE: 0.77 mg/dL (ref 0.44–1.00)
GFR calc Af Amer: >60 mL/min (ref >60)
GLUCOSE: 107 mg/dL — AB (ref 65–99)
POTASSIUM: 4.1 mmol/L (ref 3.5–5.1)
SODIUM: 139 mmol/L (ref 135–145)

## 2015-08-09 LAB — MRSA PCR SCREENING: MRSA BY PCR: NEGATIVE

## 2015-08-09 SURGERY — ECHOCARDIOGRAM, TRANSESOPHAGEAL
Anesthesia: Moderate Sedation

## 2015-08-09 SURGERY — ATRIAL FIBRILLATION ABLATION
Anesthesia: General

## 2015-08-09 MED ORDER — HYDROCODONE-ACETAMINOPHEN 5-325 MG PO TABS: 1.0000 | ORAL_TABLET | ORAL | Status: DC | PRN

## 2015-08-09 MED ORDER — DOFETILIDE 500 MCG PO CAPS
500.0000 ug | ORAL_CAPSULE | Freq: Two times a day (BID) | ORAL | Status: DC
  Administered 2015-08-09 – 2015-08-10 (×2): 500 ug via ORAL
  Filled 2015-08-09 (×3): qty 1

## 2015-08-09 MED ORDER — SODIUM CHLORIDE 0.9 % IV SOLN
2.0000 ug/min | INTRAVENOUS | Status: AC
  Administered 2015-08-09: 20 ug/min via INTRAVENOUS
  Filled 2015-08-09: qty 2

## 2015-08-09 MED ORDER — HEPARIN SODIUM (PORCINE) 1000 UNIT/ML IJ SOLN
INTRAMUSCULAR | Status: DC | PRN
  Administered 2015-08-09 (×2): 3000 [IU] via INTRAVENOUS

## 2015-08-09 MED ORDER — MIDAZOLAM HCL 10 MG/2ML IJ SOLN
INTRAMUSCULAR | Status: DC | PRN
  Administered 2015-08-09: 2 mg via INTRAVENOUS
  Administered 2015-08-09 (×2): 1 mg via INTRAVENOUS

## 2015-08-09 MED ORDER — PHENYLEPHRINE HCL 10 MG/ML IJ SOLN
10.0000 mg | INTRAMUSCULAR | Status: DC | PRN
  Administered 2015-08-09: 10 ug/min via INTRAVENOUS

## 2015-08-09 MED ORDER — ONDANSETRON HCL 4 MG/2ML IJ SOLN
INTRAMUSCULAR | Status: DC | PRN
  Administered 2015-08-09: 4 mg via INTRAVENOUS

## 2015-08-09 MED ORDER — SODIUM CHLORIDE 0.9 % IV SOLN
INTRAVENOUS | Status: DC | PRN
  Administered 2015-08-09: 07:00:00 via INTRAVENOUS

## 2015-08-09 MED ORDER — SODIUM CHLORIDE 0.9 % IJ SOLN
3.0000 mL | INTRAMUSCULAR | Status: DC | PRN
Start: 2015-08-09 — End: 2015-08-10

## 2015-08-09 MED ORDER — SODIUM CHLORIDE 0.9 % IV SOLN
INTRAVENOUS | Status: DC
  Administered 2015-08-09: 500 mL via INTRAVENOUS

## 2015-08-09 MED ORDER — PROTAMINE SULFATE 10 MG/ML IV SOLN
INTRAVENOUS | Status: DC | PRN
  Administered 2015-08-09: 10 mg via INTRAVENOUS
  Administered 2015-08-09: 20 mg via INTRAVENOUS

## 2015-08-09 MED ORDER — APIXABAN 5 MG PO TABS
5.0000 mg | ORAL_TABLET | Freq: Two times a day (BID) | ORAL | Status: DC
  Administered 2015-08-09 – 2015-08-10 (×2): 5 mg via ORAL
  Filled 2015-08-09 (×2): qty 1

## 2015-08-09 MED ORDER — HEPARIN SODIUM (PORCINE) 1000 UNIT/ML IJ SOLN
INTRAMUSCULAR | Status: AC
  Filled 2015-08-09: qty 1

## 2015-08-09 MED ORDER — FENTANYL CITRATE (PF) 100 MCG/2ML IJ SOLN
INTRAMUSCULAR | Status: DC | PRN
  Administered 2015-08-09 (×2): 25 ug via INTRAVENOUS

## 2015-08-09 MED ORDER — IOHEXOL 350 MG/ML SOLN
INTRAVENOUS | Status: DC | PRN
  Administered 2015-08-09: 102 mL via INTRACARDIAC

## 2015-08-09 MED ORDER — SODIUM CHLORIDE 0.9 % IJ SOLN
3.0000 mL | Freq: Two times a day (BID) | INTRAMUSCULAR | Status: DC
  Administered 2015-08-09: 3 mL via INTRAVENOUS

## 2015-08-09 MED ORDER — ACETAMINOPHEN 325 MG PO TABS
650.0000 mg | ORAL_TABLET | ORAL | Status: DC | PRN
  Administered 2015-08-10 (×2): 650 mg via ORAL
  Filled 2015-08-09 (×2): qty 2

## 2015-08-09 MED ORDER — BUPIVACAINE HCL (PF) 0.25 % IJ SOLN
INTRAMUSCULAR | Status: DC | PRN
  Administered 2015-08-09: 25 mL

## 2015-08-09 MED ORDER — FENTANYL CITRATE (PF) 100 MCG/2ML IJ SOLN
INTRAMUSCULAR | Status: AC
  Filled 2015-08-09: qty 2

## 2015-08-09 MED ORDER — BUPIVACAINE HCL (PF) 0.25 % IJ SOLN
INTRAMUSCULAR | Status: AC
  Filled 2015-08-09: qty 30

## 2015-08-09 MED ORDER — ONDANSETRON HCL 4 MG/2ML IJ SOLN: 4.0000 mg | Freq: Four times a day (QID) | INTRAMUSCULAR | Status: DC | PRN

## 2015-08-09 MED ORDER — HEPARIN SODIUM (PORCINE) 1000 UNIT/ML IJ SOLN
INTRAMUSCULAR | Status: DC | PRN
  Administered 2015-08-09: 12000 [IU] via INTRAVENOUS

## 2015-08-09 MED ORDER — FENTANYL CITRATE (PF) 100 MCG/2ML IJ SOLN
INTRAMUSCULAR | Status: DC | PRN
  Administered 2015-08-09 (×4): 25 ug via INTRAVENOUS

## 2015-08-09 MED ORDER — MIDAZOLAM HCL 5 MG/ML IJ SOLN
INTRAMUSCULAR | Status: AC
  Filled 2015-08-09: qty 2

## 2015-08-09 MED ORDER — LIDOCAINE HCL (CARDIAC) 20 MG/ML IV SOLN
INTRAVENOUS | Status: DC | PRN
  Administered 2015-08-09: 80 mg via INTRAVENOUS

## 2015-08-09 MED ORDER — BUTAMBEN-TETRACAINE-BENZOCAINE 2-2-14 % EX AERO
INHALATION_SPRAY | CUTANEOUS | Status: DC | PRN
  Administered 2015-08-09: 2 via TOPICAL

## 2015-08-09 MED ORDER — SODIUM CHLORIDE 0.9 % IV SOLN: INTRAVENOUS | Status: DC

## 2015-08-09 MED ORDER — PROPOFOL 10 MG/ML IV BOLUS
INTRAVENOUS | Status: DC | PRN
  Administered 2015-08-09: 200 mg via INTRAVENOUS

## 2015-08-09 MED ORDER — SODIUM CHLORIDE 0.9 % IV SOLN: 250.0000 mL | INTRAVENOUS | Status: DC | PRN

## 2015-08-09 SURGICAL SUPPLY — 22 items
BAG SNAP BAND KOVER 36X36 (MISCELLANEOUS) ×3 IMPLANT
BLANKET WARM UNDERBOD FULL ACC (MISCELLANEOUS) ×3 IMPLANT
CATH DIAG 6FR PIGTAIL (CATHETERS) ×3 IMPLANT
CATH NAVISTAR SMARTTOUCH DF (ABLATOR) ×3 IMPLANT
CATH SOUNDSTAR ECO REPROCESSED (CATHETERS) ×2 IMPLANT
CATH VARIABLE LASSO NAV 2515 (CATHETERS) ×2 IMPLANT
CATH WEBSTER BI DIR CS D-F CRV (CATHETERS) ×3 IMPLANT
COVER SWIFTLINK CONNECTOR (BAG) ×3 IMPLANT
NDL TRANSEP BRK 71CM 407200 (NEEDLE) ×1 IMPLANT
NEEDLE TRANSEP BRK 71CM 407200 (NEEDLE) ×3 IMPLANT
PACK EP LATEX FREE (CUSTOM PROCEDURE TRAY) ×3
PACK EP LF (CUSTOM PROCEDURE TRAY) ×1 IMPLANT
PAD DEFIB LIFELINK (PAD) ×3 IMPLANT
PATCH CARTO3 (PAD) ×3 IMPLANT
SHEATH AVANTI 11F 11CM (SHEATH) ×3 IMPLANT
SHEATH PINNACLE 7F 10CM (SHEATH) ×5 IMPLANT
SHEATH PINNACLE 9F 10CM (SHEATH) ×3 IMPLANT
SHEATH SWARTZ TS SL2 63CM 8.5F (SHEATH) ×3 IMPLANT
SHIELD RADPAD SCOOP 12X17 (MISCELLANEOUS) ×3 IMPLANT
SYR MEDRAD MARK V 150ML (SYRINGE) ×3 IMPLANT
TUBING CONTRAST HIGH PRESS 48 (TUBING) ×3 IMPLANT
TUBING SMART ABLATE COOLFLOW (TUBING) ×3 IMPLANT

## 2015-08-09 NOTE — Anesthesia Procedure Notes (Signed)
Procedure Name: LMA Insertion Date/Time: 08/09/2015 12:00 PM Performed by: Rush Farmer E Pre-anesthesia Checklist: Patient identified, Emergency Drugs available, Suction available, Patient being monitored and Timeout performed Patient Re-evaluated:Patient Re-evaluated prior to inductionOxygen Delivery Method: Circle system utilized Preoxygenation: Pre-oxygenation with 100% oxygen Intubation Type: IV induction LMA: LMA inserted LMA Size: 4.0 Number of attempts: 1 Placement Confirmation: breath sounds checked- equal and bilateral and positive ETCO2 Tube secured with: Tape Dental Injury: Teeth and Oropharynx as per pre-operative assessment and Injury to lip

## 2015-08-09 NOTE — CV Procedure (Signed)
    TEE  No Thrombus Normal EF Mild MR  Pre - ablation, AFIB  SKAINS, MARK, MD

## 2015-08-09 NOTE — Progress Notes (Signed)
Pt ambulated to bathroom. Pt tolerated well. Right groin remains level 1. Will continue to monitor.

## 2015-08-09 NOTE — Anesthesia Postprocedure Evaluation (Signed)
  Anesthesia Post-op Note  Patient: Melinda Lucas  Procedure(s) Performed: Procedure(s): Atrial Fibrillation Ablation (N/A)  Patient Location: PACU  Anesthesia Type:General  Level of Consciousness: awake  Airway and Oxygen Therapy: Patient Spontanous Breathing  Post-op Pain: none  Post-op Assessment: Post-op Vital signs reviewed, Patient's Cardiovascular Status Stable, Respiratory Function Stable, Patent Airway, No signs of Nausea or vomiting and Pain level controlled              Post-op Vital Signs: Reviewed and stable  Last Vitals:  Filed Vitals:   08/09/15 1530  BP: 136/74  Pulse: 80  Temp:   Resp: 14    Complications: No apparent anesthesia complications

## 2015-08-09 NOTE — Interval H&P Note (Signed)
History and Physical Interval Note:  08/09/2015 8:06 AM  Melinda Lucas  has presented today for surgery, with the diagnosis of AFIB  The various methods of treatment have been discussed with the patient and family. After consideration of risks, benefits and other options for treatment, the patient has consented to  Procedure(s): TRANSESOPHAGEAL ECHOCARDIOGRAM (TEE) (N/A) as a surgical intervention .  The patient's history has been reviewed, patient examined, no change in status, stable for surgery.  I have reviewed the patient's chart and labs.  Questions were answered to the patient's satisfaction.     Ira Busbin

## 2015-08-09 NOTE — Interval H&P Note (Signed)
History and Physical Interval Note:  08/09/2015 7:45 AM  Melinda Lucas  has presented today for surgery, with the diagnosis of afib  The various methods of treatment have been discussed with the patient and family. After consideration of risks, benefits and other options for treatment, the patient has consented to  Procedure(s): Atrial Fibrillation Ablation (N/A) as a surgical intervention .  The patient's history has been reviewed, patient examined, no change in status, stable for surgery.  I have reviewed the patient's chart and labs.  Questions were answered to the patient's satisfaction.     Thompson Grayer

## 2015-08-09 NOTE — Anesthesia Preprocedure Evaluation (Addendum)
Anesthesia Evaluation  Patient identified by MRN, date of birth, ID band Patient awake    Reviewed: Allergy & Precautions, NPO status , Patient's Chart, lab work & pertinent test results  Airway Mallampati: II  TM Distance: >3 FB Neck ROM: Full    Dental  (+) Teeth Intact   Pulmonary former smoker,    breath sounds clear to auscultation       Cardiovascular + Peripheral Vascular Disease  + dysrhythmias Atrial Fibrillation  Rhythm:Regular Rate:Normal     Neuro/Psych negative neurological ROS     GI/Hepatic negative GI ROS, Neg liver ROS,   Endo/Other  negative endocrine ROS  Renal/GU negative Renal ROS     Musculoskeletal negative musculoskeletal ROS (+)   Abdominal   Peds  Hematology negative hematology ROS (+)   Anesthesia Other Findings   Reproductive/Obstetrics                            Anesthesia Physical Anesthesia Plan  ASA: II  Anesthesia Plan: General   Post-op Pain Management:    Induction: Intravenous  Airway Management Planned: LMA  Additional Equipment:   Intra-op Plan:   Post-operative Plan:   Informed Consent: I have reviewed the patients History and Physical, chart, labs and discussed the procedure including the risks, benefits and alternatives for the proposed anesthesia with the patient or authorized representative who has indicated his/her understanding and acceptance.     Plan Discussed with: CRNA  Anesthesia Plan Comments:         Anesthesia Quick Evaluation

## 2015-08-09 NOTE — Progress Notes (Signed)
Doing well s/p afib ablation Anticipate discharge to home in am  Resume home medicines at discharge Add protonix 40mg  daily x 6 weeks  Follow-up with Roderic Palau NP in the AF clinic in 4 weeks I will see in 3 months  Thompson Grayer MD, [facility] 08/09/2015 8:55 PM

## 2015-08-09 NOTE — Progress Notes (Signed)
  Echocardiogram Echocardiogram Transesophageal has been performed.  Darlina Sicilian M 08/09/2015, 8:52 AM

## 2015-08-09 NOTE — H&P (View-Only) (Signed)
Electrophysiology Office Note   Date:  08/06/2015   ID:  Melinda Lucas, DOB November 19, 1940, MRN [id number]  PCP:  Marton Redwood, MD  Cardiologist:  Dr. Wynonia Lawman Primary Electrophysiologist: Dr. Rayann Heman    Chief Complaint  Patient presents with  . PAF     History of Present Illness: Melinda Lucas is a 74 y.o. female who presents today for electrophysiology evaluation.   She is accompanied by her husband today.  She reports her first known diagnosis of AFib about a year ago, she says she can definitely tell when she is Afib, feels very weak with onset, she is uncertain if she has any particular symptoms with conversion back to normal Melinda.  She states she cannot do her normal activities comfortably when in Afib, one particular activity is her dog training that she does routinely and cannot, feeling like it may provoke her Afib at least once and does not have the exertional capacity when in Afib.  She reports one Episode when she was in Afib that she was walking up some steps and woke when she hit her head on the railing, this prompted an ER visit and shortly afterwards is when she was started on Tikosyn by her primary cardiologist late September.  Since being on Tikosyn she has felt some episodes of AFib, once she felt very weak, and she happened to have an appt with Dr. Wynonia Lawman and his note states she was in junctional Melinda in the 40bpm range, the patient reports 32bpm and her Nadolol was stopped.  Notes also report that she has been intolerant to Cardizem and Metoprolol.  She is currently wearing an event monitor and is pending sleep study scheduled in December.  She has been referred for discussion about possible AFib ablation and after a call from her cardiologist with abnormal tracings a PPM possibly.   Today, she denies symptoms of palpitations, chest pain, shortness of breath, orthopnea, PND, lower extremity edema, claudication, dizziness, presyncope, syncope, bleeding, or neurologic  sequela. The patient is tolerating medications without difficulties and is otherwise without complaint today.    Past Medical History  Diagnosis Date  . Melanoma in situ of lower leg ([place])   . Obesity (BMI 30-39.9)   . Atrial fibrillation [facility])    Past Surgical History  Procedure Laterality Date  . Melanoma excision  2010    left leg  . Wrist fracture surgery  2011     Current Outpatient Prescriptions  Medication Sig Dispense Refill  . atorvastatin (LIPITOR) 20 MG tablet Take 20 mg by mouth daily.    . Coenzyme Q10 (CO Q 10 PO) Take 1 tablet by mouth daily.    Marland Kitchen dofetilide (TIKOSYN) 500 MCG capsule Take 1 capsule (500 mcg total) by mouth every 12 (twelve) hours. 60 capsule 12  . ELIQUIS 5 MG TABS tablet Take 5 mg by mouth 2 (two) times daily.  12  . TURMERIC PO Take 1 capsule by mouth daily.    . Vitamin D, Ergocalciferol, (DRISDOL) 50000 UNITS CAPS Take 50,000 Units by mouth every 14 (fourteen) days.      No current facility-administered medications for this visit.    Allergies:   Review of patient's allergies indicates no known allergies.   Social History:  The patient  reports that she has quit smoking. She has never used smokeless tobacco. She reports that she drinks about 3.5 oz of alcohol per week. She reports that she does not use illicit drugs.   Family History:  The patient's  family history includes AAA (abdominal aortic aneurysm) in her father; Colon cancer (age of onset: 42) in her mother; Hypertension in her brother.    ROS:  Please see the history of present illness.   All other systems are reviewed and negative.    PHYSICAL EXAM: VS:  BP 132/84 mmHg  Pulse 75  Ht 5\' 5"  (1.651 m)  Wt 187 lb 6.4 oz (85.004 kg)  BMI 31.18 kg/m2 , BMI Body mass index is 31.18 kg/(m^2). GEN: Well nourished, well developed, in no acute distress HEENT: normal Neck: no JVD, carotid bruits, or masses Cardiac: RRR; no significant murmurs, rubs, or gallops,no edema  Respiratory:   clear to auscultation bilaterally, normal work of breathing GI: soft, nontender, nondistended, + BS MS: no deformity or atrophy Skin: warm and dry  Neuro:  Strength and sensation are intact Psych: euthymic mood, full affect  EKG:  EKG is ordered today. The ekg ordered today shows SR   Recent Labs: 07/14/2015: Hemoglobin 15.0; Platelets 245; TSH 2.491 07/17/2015: BUN 11; Creatinine, Ser 0.85; Magnesium 1.9; Potassium 4.2; Sodium 138    Lipid Panel  No results found for: CHOL, TRIG, HDL, CHOLHDL, VLDL, LDLCALC, LDLDIRECT   Wt Readings from Last 3 Encounters:  08/06/15 187 lb 6.4 oz (85.004 kg)  07/17/15 181 lb 11.2 oz (82.419 kg)  12/09/12 189 lb (85.73 kg)      Other studies Reviewed: Additional studies/ records that were reviewed today include Dr. Maurice Small notes: Makes reference to an echocardiogram January with EF 55%, mild-mod LVH and atria size of 4.4cm, mild MAC Event Monitor tracings 07/24/15-current: SR, PAFib with rates up to 170, post termination pause of approximately 7seconds (at 11:30AM)    ASSESSMENT AND PLAN:  1.  PAFib      CHADS2Vasc is 3 on Eliquis and Tikosyn.   She has failed medical therapy with tikosyn.  She has ongoing paroxysms of afib Therapeutic strategies for afib including medicine and ablation were discussed in detail with the patient today. Risk, benefits, and alternatives to EP study and radiofrequency ablation for afib were also discussed in detail today. These risks include but are not limited to stroke, bleeding, vascular damage, tamponade, perforation, damage to the esophagus, lungs, and other structures, pulmonary vein stenosis, worsening renal function, and death. The patient understands these risk and wishes to proceed.  We will therefore proceed with catheter ablation at the next available time.  2. Sick sinus syndrome Post termination pauses documented on event monitor I have spoken with Dr Wynonia Lawman who hopes that post ablation these will  resolve. I have discussed options of leadless pacing, traditional pacing, or arrhythmia management with ablation as options going forward.  She is clear that she would prefer to avoid PPM at this time and would prefer an initial attempt at ablation. No driving until resolved. She will continue to wear her event monitor for 2 more weeks if additional pauses occur, we may need to consider a more urgent approach of pacing.  She would like to proceed with ablation procedure but would like Dr. Wynonia Lawman to be part of the conversation as well.  I have left Dr Wynonia Lawman a VM and will plan to speak with him soon.  Current medicines are reviewed at length with the patient today.   The patient does not have concerns regarding her medicines.  The following changes were made today:  none   Army Fossa MD 08/06/2015 5:14 PM     [facility]  [address] [place] [address] [phone] (office) [phone] (fax)

## 2015-08-09 NOTE — Transfer of Care (Signed)
Immediate Anesthesia Transfer of Care Note  Patient: Melinda Lucas  Procedure(s) Performed: Procedure(s): Atrial Fibrillation Ablation (N/A)  Patient Location: Cath Lab  Anesthesia Type:General  Level of Consciousness: awake, alert  and oriented  Airway & Oxygen Therapy: Patient Spontanous Breathing and Patient connected to nasal cannula oxygen  Post-op Assessment: Report given to RN, Post -op Vital signs reviewed and stable and Patient moving all extremities  Post vital signs: Reviewed and stable  Last Vitals:  Filed Vitals:   08/09/15 1000  BP: 138/54  Pulse: 63  Temp:   Resp: 16    Complications: No apparent anesthesia complications

## 2015-08-09 NOTE — Progress Notes (Signed)
Site area: right groin fv sheaths X 3 Site Prior to Removal:  Level 0 Pressure Applied For: 20 minutes Manual:   yes Patient Status During Pull:  stable Post Pull Site:  Level  0 Post Pull Instructions Given:  yes Post Pull Pulses Present: yes Dressing Applied:  Small tegaderm Bedrest begins @  1800 Comments: IV saline locked

## 2015-08-09 NOTE — Progress Notes (Signed)
ACT 183. Spoke with Dr. Rayann Heman. OK to pull sheaths now.

## 2015-08-10 ENCOUNTER — Telehealth: Payer: Self-pay | Admitting: Internal Medicine

## 2015-08-10 ENCOUNTER — Encounter (HOSPITAL_COMMUNITY): Payer: Self-pay | Admitting: Internal Medicine

## 2015-08-10 DIAGNOSIS — I480 Paroxysmal atrial fibrillation: Secondary | ICD-10-CM

## 2015-08-10 DIAGNOSIS — I10 Essential (primary) hypertension: Secondary | ICD-10-CM | POA: Diagnosis not present

## 2015-08-10 DIAGNOSIS — E785 Hyperlipidemia, unspecified: Secondary | ICD-10-CM | POA: Diagnosis not present

## 2015-08-10 DIAGNOSIS — Z79899 Other long term (current) drug therapy: Secondary | ICD-10-CM | POA: Diagnosis not present

## 2015-08-10 DIAGNOSIS — Z7901 Long term (current) use of anticoagulants: Secondary | ICD-10-CM | POA: Diagnosis not present

## 2015-08-10 DIAGNOSIS — Z87891 Personal history of nicotine dependence: Secondary | ICD-10-CM | POA: Diagnosis not present

## 2015-08-10 LAB — BASIC METABOLIC PANEL
ANION GAP: 6 (ref 5–15)
BUN: 5 mg/dL — ABNORMAL LOW (ref 6–20)
CHLORIDE: 108 mmol/L (ref 101–111)
CO2: 26 mmol/L (ref 22–32)
Calcium: 8.4 mg/dL — ABNORMAL LOW (ref 8.9–10.3)
Creatinine, Ser: 0.77 mg/dL (ref 0.44–1.00)
GFR calc Af Amer: >60 mL/min (ref >60)
GFR calc non Af Amer: >60 mL/min (ref >60)
GLUCOSE: 98 mg/dL (ref 65–99)
POTASSIUM: 3.9 mmol/L (ref 3.5–5.1)
Sodium: 140 mmol/L (ref 135–145)

## 2015-08-10 MED ORDER — PANTOPRAZOLE SODIUM 40 MG PO TBEC: 40.0000 mg | DELAYED_RELEASE_TABLET | Freq: Every day | ORAL | Status: DC

## 2015-08-10 MED ORDER — OMEPRAZOLE 20 MG PO CPDR: 20.0000 mg | DELAYED_RELEASE_CAPSULE | Freq: Every day | ORAL | Status: DC

## 2015-08-10 NOTE — Discharge Instructions (Signed)
You have an appointment set up with the [facility].  Multiple studies have shown that being followed by a dedicated atrial fibrillation clinic in addition to the standard care you receive from your other physicians improves health. We believe that enrollment in the atrial fibrillation clinic will allow [place] to better care for you.   The phone number to the [facility] is [phone]. The clinic is staffed Monday through Friday from 8:30am to 5pm.  Parking Directions: The clinic is located in the Heart and Vascular Building connected to [facility]. 1)From [address] turn on to [address] and go to the 3rd entrance  (Heart and Vascular entrance) on the right. 2)Look to the right for Heart &Vascular Parking Garage. 3)A code for the entrance is required please call the clinic to receive this.   4)Take the elevators to the 1st floor. Registration is in the room with the glass walls at the end of the hallway.  If you have any trouble parking or locating the clinic, please dont hesitate to call [phone].  Groin Site Care Refer to this sheet in the next few weeks. These instructions provide you with information on caring for yourself after your procedure. Your caregiver may also give you more specific instructions. Your treatment has been planned according to current medical practices, but problems sometimes occur. Call your caregiver if you have any problems or questions after your procedure. HOME CARE INSTRUCTIONS  You may shower 24 hours after the procedure. Remove the bandage (dressing) and gently wash the site with plain soap and water. Gently pat the site dry.   Do not apply powder or lotion to the site.   Do not sit in a bathtub, swimming pool, or whirlpool for 5 to 7 days.   No bending, squatting, or lifting anything over 10 pounds (4.5 kg) as directed by your caregiver.   Inspect the site at least twice daily.   Do not drive home if you  are discharged the same day of the procedure. Have someone else drive you.   You may drive 24 hours after the procedure unless otherwise instructed by your caregiver.  What to expect:  Any bruising will usually fade within 1 to 2 weeks.   Blood that collects in the tissue (hematoma) may be painful to the touch. It should usually decrease in size and tenderness within 1 to 2 weeks.  SEEK IMMEDIATE MEDICAL CARE IF:  You have unusual pain at the groin site or down the affected leg.   You have redness, warmth, swelling, or pain at the groin site.   You have drainage (other than a small amount of blood on the dressing).   You have chills.   You have a fever or persistent symptoms for more than 72 hours.   You have a fever and your symptoms suddenly get worse.   Your leg becomes pale, cool, tingly, or numb.  You have heavy bleeding from the site. Hold pressure on the site. Marland Kitchen

## 2015-08-10 NOTE — Discharge Summary (Signed)
ELECTROPHYSIOLOGY PROCEDURE DISCHARGE SUMMARY    Patient ID: Melinda Lucas,  MRN: [id number], DOB/AGE: [birth year] 74 y.o.  Admit date: 08/09/2015 Discharge date: 08/10/2015  Primary Care Physician: Marton Redwood, MD Primary Cardiologist: Dr. Wynonia Lawman Electrophysiologist: Thompson Grayer, MD  Primary Discharge Diagnosis:  1.Atrial Fibrillation  Secondary Discharge Diagnosis:  1. Dyslipidemia  Procedures This Admission:  1.  Electrophysiology study and radiofrequency catheter ablation on 08/09/15 by Dr Thompson Grayer.  This study demonstrated atrial fibrillation.   2. TEE  Brief HPI: Melinda Lucas is a 74 y.o. female with a history of symptomatic paroxysmal atrial fibrillation.  She failed medical therapy with Tikosyn and was noted on an out patient event monitoring with her primary cardiologist to have significant post termination pauses and history of a brief syncopal event in September . Risks, benefits, and alternatives to catheter ablation of atrial fibrillation were reviewed with the patient who wished to proceed.  The patient underwent TEE prior to the procedure which demonstrated normal LV function and no LAA thrombus.    Hospital Course:  The patient was admitted and underwent EPS/RFCA of atrial fibrillation with details as outlined above, see full procedure note.  They were monitored on telemetry overnight which demonstrated SR.  Groin was without complication on the day of discharge.  The patient was examined by Dr. Curt Bears and considered to be stable for discharge.  Wound care and restrictions were reviewed with the patient.  The patient will be seen back by Dr Rayann Heman in 12 weeks for post ablation follow up and Orson Eva in the AFib clinic in 4 weeks. Instructed to do no rigorous activities for 1 week. The patient this morning feels well, she denies any kind of CP, palpitations or SOB, she reports sleeping well.  Her O2 Sat is 98-100% this morning on 2l n/c, BP is  stable.   This patients CHA2DS2-VASc Score and unadjusted Ischemic Stroke Rate (% per year) is equal to 3.2 % stroke rate/year from a score of 3 Above score calculated as 1 point each if present [CHF, HTN, DM, Vascular=MI/PAD/Aortic Plaque, Age if 65-74, or Female] Above score calculated as 2 points each if present [Age > 75, or Stroke/TIA/TE]    Physical Exam: Filed Vitals:   08/10/15 0220 08/10/15 0400 08/10/15 0732 08/10/15 0800  BP:  110/50 93/28 101/36  Pulse: 80 75 80 82  Temp:  98.7 F (37.1 C) 98.1 F (36.7 C)   TempSrc:  Oral Oral   Resp: 21 21 20 28   Height:      Weight:      SpO2: 95% 100% 98% 98%    GEN- The patient is obese, well appearing, alert and oriented x 3 today.   HEENT: normocephalic, atraumatic; sclera clear, conjunctiva pink; hearing intact; oropharynx clear; neck supple, no JVP Lungs- Clear to ausculation bilaterally, normal work of breathing, not tachypnic.  No wheezes, rales, rhonchi Heart- Regular rate and rhythm, no murmurs, rubs or gallops, heart sounds are not distant GI- soft, non-tender, non-distended, bowel sounds present Extremities- no clubbing, cyanosis, or edema; DP/PT 2+ bilaterally, groin without hematoma/bruit, small amount of ecchymosis, nontender MS- no significant deformity or atrophy Skin- warm and dry, no rash or lesion Psych- euthymic mood, full affect Neuro- no gross defecits   Labs:   Lab Results  Component Value Date   WBC 4.3 08/09/2015   HGB 13.1 08/09/2015   HCT 40.1 08/09/2015   MCV 96.2 08/09/2015   PLT 190 08/09/2015  Recent Labs Lab 08/10/15 0229  NA 140  K 3.9  CL 108  CO2 26  BUN 5*  CREATININE 0.77  CALCIUM 8.4*  GLUCOSE 98   EKG this morning is SR, QTc is 438ms                                                                                 Discharge Medications:    Medication List    TAKE these medications        atorvastatin 20 MG tablet  Commonly known as:  LIPITOR  Take 20 mg by  mouth daily.     CO Q 10 PO  Take 1 tablet by mouth daily.     dofetilide 500 MCG capsule  Commonly known as:  TIKOSYN  Take 1 capsule (500 mcg total) by mouth every 12 (twelve) hours.     ELIQUIS 5 MG Tabs tablet  Generic drug:  apixaban  Take 5 mg by mouth 2 (two) times daily.     pantoprazole 40 MG tablet  Commonly known as:  PROTONIX  Take 1 tablet (40 mg total) by mouth daily.     TURMERIC PO  Take 1 capsule by mouth daily.     Vitamin D (Ergocalciferol) 50000 UNITS Caps capsule  Commonly known as:  DRISDOL  Take 50,000 Units by mouth every 14 (fourteen) days.        Disposition: Home The patient has history of syncope, and post termination pauses, now s/p AF ablation.  She has been instructed not to drive until seen by Dr. Rayann Heman in clinic in 3months to be re-evaluated at that time, she states understanding.  She is also counseled on the importance of taking her medicines without missing any doses and understands this as well.  We will add Protonix for 6 weeks. Discharge Instructions    Diet - low sodium heart healthy    Complete by:  As directed      Increase activity slowly    Complete by:  As directed           Follow-up Information    Follow up with [facility] On 09/07/2015.   Specialty:  Cardiology   Why:  11:00AM   Contact information:   [address] [id number] [place] [phone]      Follow up with Thompson Grayer, MD On 11/12/2015.   Specialty:  Cardiology   Contact information:   [place] [address] [phone]       Duration of Discharge Encounter: Greater than 30 minutes including physician time.  SignedTommye Standard, PA-C 08/10/2015 10:07 AM   I have seen and examined this patient with Tommye Standard.  Agree with above, note added to reflect my findings.  On exam, regular rhythm, no murmurs, lungs clear.  Patient had syncope and found to have post  conversion AF pauses.  Likely the cause of her syncope.  Had AF ablation and is in sinus rhythm today.  Will have her follow up in EP clinic.  She is not to drive until she sees Dr. Rayann Heman back in clinic.  She will also be discharged  on protonix.    Will M. Camnitz MD 08/10/2015 11:29 AM

## 2015-08-10 NOTE — Telephone Encounter (Signed)
Calling stating her insurance won't pay for Pantoprazole 40 mg.  Her husband went to pharmacy and got 10 pills of Pantoprazole and was over $30.00.  Spoke w/Sally Earle,Pharm and she advised  she could use Prilosec 20 mg daily. Pt aware and will get the Prilosec 20 mg

## 2015-08-10 NOTE — Telephone Encounter (Signed)
New message     Pt was discharged from hosp today.  She had an ablation.  Ins will not pay for pantoprazole 40mg .  She is to take this twice a day.  Since the weekend is here and she cannot get this medication, what should she do?   Can she wait until sometimes next to get medication filled?  Please call and let her know what to do for the weekend?

## 2015-08-21 DIAGNOSIS — R55 Syncope and collapse: Secondary | ICD-10-CM | POA: Diagnosis not present

## 2015-08-21 DIAGNOSIS — R42 Dizziness and giddiness: Secondary | ICD-10-CM | POA: Diagnosis not present

## 2015-08-21 DIAGNOSIS — I480 Paroxysmal atrial fibrillation: Secondary | ICD-10-CM | POA: Diagnosis not present

## 2015-08-21 DIAGNOSIS — Z7901 Long term (current) use of anticoagulants: Secondary | ICD-10-CM | POA: Diagnosis not present

## 2015-08-21 DIAGNOSIS — Z6830 Body mass index (BMI) 30.0-30.9, adult: Secondary | ICD-10-CM | POA: Diagnosis not present

## 2015-08-21 DIAGNOSIS — R7301 Impaired fasting glucose: Secondary | ICD-10-CM | POA: Diagnosis not present

## 2015-08-21 DIAGNOSIS — R0683 Snoring: Secondary | ICD-10-CM | POA: Diagnosis not present

## 2015-08-28 ENCOUNTER — Encounter: Payer: Self-pay | Admitting: Cardiology

## 2015-08-28 DIAGNOSIS — I480 Paroxysmal atrial fibrillation: Secondary | ICD-10-CM | POA: Diagnosis not present

## 2015-08-28 DIAGNOSIS — E668 Other obesity: Secondary | ICD-10-CM | POA: Diagnosis not present

## 2015-08-28 DIAGNOSIS — Z7901 Long term (current) use of anticoagulants: Secondary | ICD-10-CM | POA: Diagnosis not present

## 2015-08-28 DIAGNOSIS — R42 Dizziness and giddiness: Secondary | ICD-10-CM | POA: Diagnosis not present

## 2015-08-28 DIAGNOSIS — R0602 Shortness of breath: Secondary | ICD-10-CM | POA: Diagnosis not present

## 2015-08-28 NOTE — Progress Notes (Signed)
Patient ID: Melinda Lucas, female   DOB: 12/22/1940, 74 y.o.   MRN: [id number]    Melinda Lucas, Melinda Lucas  Date of visit:  08/28/2015 DOB:  April 04, 1941    Age:  74 yrs. Medical record number:  [id number]     Account number:  [id number] Primary Care Provider: Lutricia Feil ____________________________ CURRENT DIAGNOSES  1. Paroxysmal atrial fibrillation  2. Dyspnea  3. Dizziness and giddiness  4. Obesity  5. Long term (current) use of anticoagulants ____________________________ ALLERGIES  Diltiazem, Weakness present  Metoprolol, Dream disorder ____________________________ MEDICATIONS  1. Vitamin D2 50,000 unit capsule, q 2 weeks  2. Eliquis 5 mg tablet, BID  3. atorvastatin 20 mg tablet, 1 p.o. daily  4. Tikosyn 500 mcg capsule, BID  5. pantoprazole 40 mg tablet,delayed release, 1 p.o. daily ____________________________ CHIEF COMPLAINTS  Followup of Long term (current) use of anticoagulants  Followup of Paroxysmal atrial fibrillation ____________________________ HISTORY OF PRESENT ILLNESS Patient seen for cardiac followup. Since she was here she wore a cardiac event monitor that showed a termination pause of about 7 seconds. She has had paroxysmal atrial fibrillation underwent ablation on October 20 with Dr. Rayann Heman. She continued to wear the monitor after that and had no recurrence of atrial fibrillation or atrial flutter. She has not had any episodes of syncope. She does complain of fatigue and some mild dyspnea with exertion. She is upset that she has been told that she cannot drive for 3 months. She still has malaise and fatigue. She wants to know about activity and also whether she should continue on Tikosyn or not. She doesn't have angina. She did have a TEE that showed good LV function prior to the ablation. She is not aware of any recurrent arrhythmia since she had the procedure. ____________________________ PAST HISTORY  Past Medical Illnesses:  melanoma, obesity;  Cardiovascular Illnesses:   atrial fibrillation-paroxysmal;  Surgical Procedures:  melanoma excision, fx l wrist;  NYHA Classification:  I;  Canadian Angina Classification:  Class 0: Asymptomatic;  Cardiology Procedures-Invasive:  no history of prior cardiac procedures;  Cardiology Procedures-Noninvasive:  event monitor December 2015, echocardiogram January 2016, event monitor March 2016, event monitor October 2016;  LVEF of 60% documented via echocardiogram on 11/13/2014,   CHA2DS2-VASC Score:  3 ____________________________ CARDIO-PULMONARY TEST DATES EKG Date:  08/28/2015;  Holter/Event Monitor Date: 01/17/2015;  Echocardiography Date: 11/13/2014;  Chest Xray Date: 07/15/2015;   ____________________________ SOCIAL HISTORY Alcohol Use:  daily;  Smoking:  used to smoke but quit Prior to 1980, 10 pack year history;  Diet:  regular diet;  Lifestyle:  married;  Exercise:  dog training;  Occupation:  retired Advice worker;  Residence:  lives with husband;   ____________________________ PHYSICAL EXAMINATION VITAL SIGNS  Blood Pressure:  116/62 Sitting, Right arm, regular cuff   Pulse:  76/min. Weight:  188.00 lbs. Height:  65"BMI: 31  Constitutional:  pleasant white female, in no acute distress, mildly obese Skin:  warm and dry to touch, no apparent skin lesions, or masses noted. Head:  normocephalic, normal hair pattern, no masses or tenderness Chest:  normal symmetry, clear to auscultation. Cardiac:  regular rhythm, normal S1 and S2, no S3 or S4, grade 1/6 systolic murmur Peripheral Pulses:  the femoral,dorsalis pedis, and posterior tibial pulses are full and equal bilaterally with no bruits auscultated. Neurological:  no gross motor or sensory deficits noted, affect appropriate, oriented x3. ____________________________ MOST RECENT LIPID PANEL 05/22/15  CHOL TOTL 131 mg/dl, LDL 64 NM, HDL 44 mg/dl and TRIGLYCER 101  mg/dl ____________________________ IMPRESSIONS/PLAN  1. Paroxysmal atrial fibrillation with no evidence  of recurrence-EKG shows sinus rhythm today 2. Obesity 3. Long-term anticoagulation 4. Dyspnea and fatigue of uncertain cause  Recommendations:  We discussed her driving restrictions and will revisit that issue with Dr. Rayann Heman. EKG shows sinus rhythm today. I drew lab work as noted below and would like for her to have a return visit in one month. Increase activity. Questions discussed with patient and husband. ____________________________ TODAYS ORDERS  1. Comprehensive Metabolic Panel: Today  2. Complete Blood Count: Today  3. BNP: Today  4. Return Visit: 1 month  5. 12 Lead EKG: Today                       ____________________________ Cardiology Physician:  Kerry Hough MD [facility]

## 2015-09-07 ENCOUNTER — Ambulatory Visit (HOSPITAL_COMMUNITY)
Admit: 2015-09-07 | Discharge: 2015-09-07 | Disposition: A | Discharge status: Home / Self Care | Payer: Medicare Other | Source: Ambulatory Visit | Attending: Nurse Practitioner | Admitting: Nurse Practitioner

## 2015-09-07 VITALS — BP 144/82 | HR 84 | Ht 65.0 in | Wt 185.6 lb

## 2015-09-07 DIAGNOSIS — I481 Persistent atrial fibrillation: Secondary | ICD-10-CM | POA: Diagnosis not present

## 2015-09-07 DIAGNOSIS — I10 Essential (primary) hypertension: Secondary | ICD-10-CM | POA: Insufficient documentation

## 2015-09-07 DIAGNOSIS — I4819 Other persistent atrial fibrillation: Secondary | ICD-10-CM

## 2015-09-07 NOTE — Progress Notes (Signed)
Patient ID: Melinda Lucas, female   DOB: [birth year], 74 y.o.   MRN: [id number]     Primary Care Physician: Marton Redwood, MD Referring Physician: Dr. Miguel Aschoff is a 74 y.o. female with a h/o persistent afib, s/p ablation in afib clinic for f/u. 10/20, She continues on tikosyn. Has not had any issues since ablation. Not any irregular heart beat at all. There is question of  brief presyncopal/syncopal episode prior to tikosyn loading, but has not any lightheadedness. She did not drive for 3 weeks, but had to return to driving. She knows this is against recommendations. Denies any swallowing or rt groin issues.  Today, she denies symptoms of palpitations, chest pain, shortness of breath, orthopnea, PND, lower extremity edema, dizziness, presyncope, syncope, or neurologic sequela. The patient is tolerating medications without difficulties and is otherwise without complaint today.   Past Medical History  Diagnosis Date  . Melanoma in situ of lower leg ([place])   . Obesity (BMI 30-39.9)   . Atrial fibrillation ([place])   . Heart murmur   . GERD (gastroesophageal reflux disease)   . Arthritis     "a little in my fingers" (08/09/2015)   Past Surgical History  Procedure Laterality Date  . Melanoma excision Left 2010    leg  . Wrist fracture surgery Left 2011  . Fracture surgery    . Dilation and curettage of uterus  1970's  . Tubal ligation  1970's  . Cataract extraction w/ intraocular lens  implant, bilateral Bilateral ~ 2014  . Atrial fibrillation ablation  08/09/2015  . Electrophysiologic study N/A 08/09/2015    Procedure: Atrial Fibrillation Ablation;  Surgeon: Thompson Grayer, MD;  Location: [facility];  Service: Cardiovascular;  Laterality: N/A;  . Tee without cardioversion N/A 08/09/2015    Procedure: TRANSESOPHAGEAL ECHOCARDIOGRAM (TEE);  Surgeon: Jerline Pain, MD;  Location: [facility] ENDOSCOPY;  Service: Cardiovascular;  Laterality: N/A;    Current Outpatient  Prescriptions  Medication Sig Dispense Refill  . atorvastatin (LIPITOR) 20 MG tablet Take 20 mg by mouth daily.    . Coenzyme Q10 (CO Q 10 PO) Take 1 tablet by mouth daily.    Marland Kitchen dofetilide (TIKOSYN) 500 MCG capsule Take 1 capsule (500 mcg total) by mouth every 12 (twelve) hours. 60 capsule 12  . ELIQUIS 5 MG TABS tablet Take 5 mg by mouth 2 (two) times daily.  12  . pantoprazole (PROTONIX) 20 MG tablet Take 20 mg by mouth daily.    . TURMERIC PO Take 1 capsule by mouth daily.    . Vitamin D, Ergocalciferol, (DRISDOL) 50000 UNITS CAPS Take 50,000 Units by mouth every 14 (fourteen) days.      No current facility-administered medications for this encounter.    No Known Allergies  Social History   Social History  . Marital Status: Married    Spouse Name: N/A  . Number of Children: N/A  . Years of Education: N/A   Occupational History  . Not on file.   Social History Main Topics  . Smoking status: Former Smoker    Types: Cigarettes  . Smokeless tobacco: Never Used     Comment: "quit smoking in ~ 1970"  . Alcohol Use: 8.4 oz/week    7 Standard drinks or equivalent, 7 Shots of liquor per week     Comment: 1 drink daily wine or liquor per pt  . Drug Use: No  . Sexual Activity: Not Currently   Other Topics Concern  .  Not on file   Social History Narrative   Trains dogs currently, retired Cabin crew    Family History  Problem Relation Age of Onset  . Colon cancer Mother 71  . AAA (abdominal aortic aneurysm) Father   . Hypertension Brother     ROS- All systems are reviewed and negative except as per the HPI above  Physical Exam: Filed Vitals:   09/07/15 1127  BP: 144/82  Pulse: 84  Height: 5\' 5"  (1.651 m)  Weight: 185 lb 9.6 oz (84.188 kg)    GEN- The patient is well appearing, alert and oriented x 3 today.   Head- normocephalic, atraumatic Eyes-  Sclera clear, conjunctiva pink Ears- hearing intact Oropharynx- clear Neck- supple, no JVP Lymph- no cervical  lymphadenopathy Lungs- Clear to ausculation bilaterally, normal work of breathing Heart- Regular rate and rhythm, no murmurs, rubs or gallops, PMI not laterally displaced GI- soft, NT, ND, + BS Extremities- no clubbing, cyanosis, or edema MS- no significant deformity or atrophy Skin- no rash or lesion Psych- euthymic mood, full affect Neuro- strength and sensation are intact  EKG- NSR, 84 bpm, pr int 140 ms, qrs int 84 ms, qtc 458 ms MAG - 9/27 1.9, K+ 10/20-4.1  Assessment and Plan: 1. afib s/p ablation Doing well maintaining SR since ablation Continue tiksoyn Continue eliquis without fail Continue plans for sleep study  2. HTN Rechecked 140/80  F/u with Dr. Rayann Heman as scheduled 1/23

## 2015-09-18 DIAGNOSIS — D2261 Melanocytic nevi of right upper limb, including shoulder: Secondary | ICD-10-CM | POA: Diagnosis not present

## 2015-09-18 DIAGNOSIS — L720 Epidermal cyst: Secondary | ICD-10-CM | POA: Diagnosis not present

## 2015-09-18 DIAGNOSIS — Z85820 Personal history of malignant melanoma of skin: Secondary | ICD-10-CM | POA: Diagnosis not present

## 2015-09-18 DIAGNOSIS — Z85828 Personal history of other malignant neoplasm of skin: Secondary | ICD-10-CM | POA: Diagnosis not present

## 2015-09-18 DIAGNOSIS — L821 Other seborrheic keratosis: Secondary | ICD-10-CM | POA: Diagnosis not present

## 2015-09-30 ENCOUNTER — Ambulatory Visit (HOSPITAL_BASED_OUTPATIENT_CLINIC_OR_DEPARTMENT_OTHER): Payer: Medicare Other | Attending: Cardiology

## 2015-09-30 VITALS — Ht 65.0 in | Wt 185.0 lb

## 2015-09-30 DIAGNOSIS — Z6831 Body mass index (BMI) 31.0-31.9, adult: Secondary | ICD-10-CM | POA: Insufficient documentation

## 2015-09-30 DIAGNOSIS — Z7901 Long term (current) use of anticoagulants: Secondary | ICD-10-CM | POA: Insufficient documentation

## 2015-09-30 DIAGNOSIS — E669 Obesity, unspecified: Secondary | ICD-10-CM | POA: Diagnosis not present

## 2015-09-30 DIAGNOSIS — G4733 Obstructive sleep apnea (adult) (pediatric): Secondary | ICD-10-CM | POA: Diagnosis not present

## 2015-09-30 DIAGNOSIS — R5383 Other fatigue: Secondary | ICD-10-CM | POA: Diagnosis not present

## 2015-09-30 DIAGNOSIS — R0683 Snoring: Secondary | ICD-10-CM | POA: Diagnosis not present

## 2015-09-30 DIAGNOSIS — G4736 Sleep related hypoventilation in conditions classified elsewhere: Secondary | ICD-10-CM | POA: Diagnosis not present

## 2015-09-30 DIAGNOSIS — I4891 Unspecified atrial fibrillation: Secondary | ICD-10-CM | POA: Insufficient documentation

## 2015-09-30 DIAGNOSIS — Z79899 Other long term (current) drug therapy: Secondary | ICD-10-CM | POA: Diagnosis not present

## 2015-10-04 DIAGNOSIS — R42 Dizziness and giddiness: Secondary | ICD-10-CM | POA: Diagnosis not present

## 2015-10-04 DIAGNOSIS — E668 Other obesity: Secondary | ICD-10-CM | POA: Diagnosis not present

## 2015-10-04 DIAGNOSIS — Z7901 Long term (current) use of anticoagulants: Secondary | ICD-10-CM | POA: Diagnosis not present

## 2015-10-04 DIAGNOSIS — I480 Paroxysmal atrial fibrillation: Secondary | ICD-10-CM | POA: Diagnosis not present

## 2015-10-04 DIAGNOSIS — R0602 Shortness of breath: Secondary | ICD-10-CM | POA: Diagnosis not present

## 2015-10-06 DIAGNOSIS — G4736 Sleep related hypoventilation in conditions classified elsewhere: Secondary | ICD-10-CM | POA: Diagnosis not present

## 2015-10-06 DIAGNOSIS — G4733 Obstructive sleep apnea (adult) (pediatric): Secondary | ICD-10-CM | POA: Diagnosis not present

## 2015-10-06 DIAGNOSIS — I4891 Unspecified atrial fibrillation: Secondary | ICD-10-CM | POA: Diagnosis not present

## 2015-10-06 NOTE — Progress Notes (Signed)
  Patient Name: Melinda Lucas, Melinda Lucas Date: 09/30/2015 Gender: Female D.O.B: 02-16-1941 Age (years): 74 Referring Provider: Landry Corporal Height (inches): 77 Interpreting Physician: Baird Lyons MD, ABSM Weight (lbs): 185 RPSGT: Baxter Flattery BMI: 31 MRN: [id number] Neck Size: 15.00 CLINICAL INFORMATION Sleep Study Type: NPSG Indication for sleep study: Fatigue, Obesity, Snoring, Witnessed Apneas Epworth Sleepiness Score: 1 SLEEP STUDY TECHNIQUE As per the AASM Manual for the Scoring of Sleep and Associated Events v2.3 (April 2016) with a hypopnea requiring 4% desaturations. The channels recorded and monitored were frontal, central and occipital EEG, electrooculogram (EOG), submentalis EMG (chin), nasal and oral airflow, thoracic and abdominal wall motion, anterior tibialis EMG, snore microphone, electrocardiogram, and pulse oximetry.  MEDICATIONS Patient's medications include: charted for review. Medications self-administered by patient during sleep study : Eliquis, Tikasyn, pantoprazole, atorvastatin  SLEEP ARCHITECTURE The study was initiated at 11:06:05 PM and ended at 5:14:46 AM. Sleep onset time was 31.6 minutes and the sleep efficiency was 78.1%. The total sleep time was 288.0 minutes. Stage REM latency was 131.0 minutes. The patient spent 11.81% of the night in stage N1 sleep, 75.35% in stage N2 sleep, 0.00% in stage N3 and 12.85% in REM. Alpha intrusion was absent. Supine sleep was 0.00%.  RESPIRATORY PARAMETERS The overall apnea/hypopnea index (AHI) was 12.9 per hour. There were 0 total apneas, including 0 obstructive, 0 central and 0 mixed apneas. There were 62 hypopneas and 0 RERAs. The AHI during Stage REM sleep was 32.4 per hour. AHI while supine was N/A per hour. The mean oxygen saturation was 91.65%. The minimum SpO2 during sleep was 82.00%. Loud snoring was noted during this study.  CARDIAC DATA The 2 lead EKG demonstrated sinus rhythm. The mean heart rate  was 77.78 beats per minute. Other EKG findings include: None.  LEG MOVEMENT DATA The total PLMS were 413 with a resulting PLMS index of 86.04. Associated arousal with leg movement index was 0.2 .  IMPRESSIONS - Mild obstructive sleep apnea occurred during this study (AHI = 12.9/h). - No significant central sleep apnea occurred during this study (CAI = 0.0/h). - Mild oxygen desaturation was noted during this study (Min O2 = 82.00%). - The patient snored with Loud snoring volume. - No cardiac abnormalities were noted during this study. - Severe periodic limb movements of sleep occurred during the study. No significant associated arousals.  DIAGNOSIS - Obstructive Sleep Apnea (327.23 [G47.33 ICD-10]) - Nocturnal Hypoxemia (327.26 [G47.36 ICD-10])  RECOMMENDATIONS - Therapeutic CPAP titration to determine optimal pressure required to alleviate sleep disordered breathing. - Avoid alcohol, sedatives and other CNS depressants that may worsen sleep apnea and disrupt normal sleep architecture. - Sleep hygiene should be reviewed to assess factors that may improve sleep quality. - Weight management and regular exercise should be initiated or continued if appropriate.  Deneise Lever Diplomate, American Board of Sleep Medicine  ELECTRONICALLY SIGNED ON:  10/06/2015, 11:04 AM [place] PH: [phone]   FX: [phone] [place]

## 2015-11-01 ENCOUNTER — Encounter: Payer: Self-pay | Admitting: *Deleted

## 2015-11-12 ENCOUNTER — Encounter: Payer: Self-pay | Admitting: Internal Medicine

## 2015-11-12 ENCOUNTER — Ambulatory Visit (INDEPENDENT_AMBULATORY_CARE_PROVIDER_SITE_OTHER): Payer: Medicare Other | Admitting: Internal Medicine

## 2015-11-12 VITALS — BP 132/82 | HR 77 | Ht 65.0 in | Wt 188.8 lb

## 2015-11-12 DIAGNOSIS — E669 Obesity, unspecified: Secondary | ICD-10-CM | POA: Diagnosis not present

## 2015-11-12 DIAGNOSIS — I495 Sick sinus syndrome: Secondary | ICD-10-CM | POA: Diagnosis not present

## 2015-11-12 DIAGNOSIS — I480 Paroxysmal atrial fibrillation: Secondary | ICD-10-CM | POA: Diagnosis not present

## 2015-11-12 NOTE — Progress Notes (Signed)
Electrophysiology Office Note   Date:  11/12/2015   ID:  Melinda Lucas, DOB 1941/05/25, MRN [id number]  PCP:  Marton Redwood, MD  Cardiologist:  Dr. Wynonia Lawman Primary Electrophysiologist: Dr. Rayann Heman    Chief Complaint  Patient presents with  . Atrial Fibrillation     History of Present Illness: Melinda Lucas is a 75 y.o. female who presents today for electrophysiology evaluation.    She has done very well post ablation.  Very pleased with results.  No further AF.   No procedure related complications.  No symptomatic pauses. Today, she denies symptoms of palpitations, chest pain, shortness of breath, orthopnea, PND, lower extremity edema, claudication, dizziness, presyncope, syncope, bleeding, or neurologic sequela. The patient is tolerating medications without difficulties and is otherwise without complaint today.    Past Medical History  Diagnosis Date  . Melanoma in situ of lower leg ([place])   . Obesity (BMI 30-39.9)   . Atrial fibrillation ([place])   . Heart murmur   . GERD (gastroesophageal reflux disease)   . Arthritis     "a little in my fingers" (08/09/2015)   Past Surgical History  Procedure Laterality Date  . Melanoma excision Left 2010    leg  . Wrist fracture surgery Left 2011  . Fracture surgery    . Dilation and curettage of uterus  1970's  . Tubal ligation  1970's  . Cataract extraction w/ intraocular lens  implant, bilateral Bilateral ~ 2014  . Electrophysiologic study N/A 08/09/2015    Procedure: Atrial Fibrillation Ablation;  Surgeon: [place] Grayer, MD;  Location: [facility];  Service: Cardiovascular;  Laterality: N/A;  . Tee without cardioversion N/A 08/09/2015    Procedure: TRANSESOPHAGEAL ECHOCARDIOGRAM (TEE);  Surgeon: Jerline Pain, MD;  Location: [facility] ENDOSCOPY;  Service: Cardiovascular;  Laterality: N/A;     Current Outpatient Prescriptions  Medication Sig Dispense Refill  . atorvastatin (LIPITOR) 20 MG tablet Take 20 mg by mouth daily.    .  Coenzyme Q10 (CO Q 10 PO) Take 1 tablet by mouth daily.    Marland Kitchen dofetilide (TIKOSYN) 500 MCG capsule Take 1 capsule (500 mcg total) by mouth every 12 (twelve) hours. 60 capsule 12  . ELIQUIS 5 MG TABS tablet Take 5 mg by mouth 2 (two) times daily.  12  . pantoprazole (PROTONIX) 20 MG tablet Take 20 mg by mouth daily.    . TURMERIC PO Take 1 capsule by mouth daily.    . Vitamin D, Ergocalciferol, (DRISDOL) 50000 UNITS CAPS Take 50,000 Units by mouth every 14 (fourteen) days.      No current facility-administered medications for this visit.    Allergies:   Review of patient's allergies indicates no known allergies.   Social History:  The patient  reports that she has quit smoking. Her smoking use included Cigarettes. She has never used smokeless tobacco. She reports that she drinks about 8.4 oz of alcohol per week. She reports that she does not use illicit drugs.   Family History:  The patient's  family history includes AAA (abdominal aortic aneurysm) in her father; Colon cancer (age of onset: 36) in her mother; Hypertension in her brother.    ROS:  Please see the history of present illness.   All other systems are reviewed and negative.    PHYSICAL EXAM: VS:  BP 132/82 mmHg  Pulse 77  Ht 5\' 5"  (1.651 m)  Wt 188 lb 12.8 oz (85.639 kg)  BMI 31.42 kg/m2 , BMI Body mass  index is 31.42 kg/(m^2). GEN: Well nourished, well developed, in no acute distress HEENT: normal Neck: no JVD, carotid bruits, or masses Cardiac: RRR; no significant murmurs, rubs, or gallops,no edema  Respiratory:  clear to auscultation bilaterally, normal work of breathing GI: soft, nontender, nondistended, + BS MS: no deformity or atrophy Skin: warm and dry  Neuro:  Strength and sensation are intact Psych: euthymic mood, full affect  EKG:  EKG is ordered today. The ekg ordered today shows SR   Recent Labs: 07/14/2015: TSH 2.491 07/17/2015: Magnesium 1.9 08/09/2015: Hemoglobin 13.1; Platelets 190 08/10/2015: BUN  5*; Creatinine, Ser 0.77; Potassium 3.9; Sodium 140    Lipid Panel  No results found for: CHOL, TRIG, HDL, CHOLHDL, VLDL, LDLCALC, LDLDIRECT   Wt Readings from Last 3 Encounters:  11/12/15 188 lb 12.8 oz (85.639 kg)  09/30/15 185 lb (83.915 kg)  09/07/15 185 lb 9.6 oz (84.188 kg)      ASSESSMENT AND PLAN:  1.  PAFib  CHADS2Vasc is 3 on Eliquis and Tikosyn.   Doing very well s/p ablation Today I have offered to stop tikosyn.  She is nervous.  She would prefer to continue tikosyn for 3 more months and then discontinue at that time.  2. Sick sinus syndrome Post termination pauses resolved post ablation No indication for pacing at this time Resume normal activity  3. Obesity Body mass index is 31.42 kg/(m^2). Regular exercise and weight loss advised  4. OSA She has been diagnosed with OSA but has not yet started CPAP.  She will follow-up with Dr Wynonia Lawman regarding this  Current medicines are reviewed at length with the patient today.   The patient does not have concerns regarding her medicines.  The following changes were made today:  None   Follow-up with Dr Wynonia Lawman as scheduled She has been diagnosed with OSA but has not yet started CPAP.  She will follow-up with Dr Wynonia Lawman regarding this  Return to see me in 3 months  Signed, Thompson Grayer MD 11/12/2015 12:29 PM     [facility] [address] [place] [address] [phone] (office) [phone] (fax)

## 2015-11-12 NOTE — Patient Instructions (Signed)
Medication Instructions:  Your physician has recommended you make the following change in your medication:  1) Stop Protonix   Labwork: None ordered   Testing/Procedures: None ordered   Follow-Up: Your physician recommends that you schedule a follow-up appointment in: 3 months with Dr Rayann Heman   Any Other Special Instructions Will Be Listed Below (If Applicable).     If you need a refill on your cardiac medications before your next appointment, please call your pharmacy.

## 2015-12-03 DIAGNOSIS — Z961 Presence of intraocular lens: Secondary | ICD-10-CM | POA: Diagnosis not present

## 2015-12-03 DIAGNOSIS — H179 Unspecified corneal scar and opacity: Secondary | ICD-10-CM | POA: Diagnosis not present

## 2015-12-03 DIAGNOSIS — H524 Presbyopia: Secondary | ICD-10-CM | POA: Diagnosis not present

## 2015-12-18 ENCOUNTER — Telehealth: Payer: Self-pay | Admitting: *Deleted

## 2015-12-18 ENCOUNTER — Ambulatory Visit (INDEPENDENT_AMBULATORY_CARE_PROVIDER_SITE_OTHER): Payer: Medicare Other | Admitting: Physician Assistant

## 2015-12-18 ENCOUNTER — Encounter: Payer: Self-pay | Admitting: Physician Assistant

## 2015-12-18 VITALS — BP 118/78 | HR 88 | Ht 65.0 in | Wt 185.0 lb

## 2015-12-18 DIAGNOSIS — Z86010 Personal history of colonic polyps: Secondary | ICD-10-CM | POA: Diagnosis not present

## 2015-12-18 DIAGNOSIS — Z1211 Encounter for screening for malignant neoplasm of colon: Secondary | ICD-10-CM

## 2015-12-18 DIAGNOSIS — Z7901 Long term (current) use of anticoagulants: Secondary | ICD-10-CM | POA: Diagnosis not present

## 2015-12-18 MED ORDER — NA SULFATE-K SULFATE-MG SULF 17.5-3.13-1.6 GM/177ML PO SOLN: 1.0000 | Freq: Once | ORAL | Status: DC

## 2015-12-18 NOTE — Patient Instructions (Signed)
You have been scheduled for a colonoscopy. Please follow written instructions given to you at your visit today.  Please pick up your prep supplies at the pharmacy within the next 1-3 days. [facility], [address] [place] If you use inhalers (even only as needed), please bring them with you on the day of your procedure. Your physician has requested that you go to www.startemmi.com and enter the access code given to you at your visit today. This web site gives a general overview about your procedure. However, you should still follow specific instructions given to you by our office regarding your preparation for the procedure.  We will call you after we hear from Dr. Rayann Heman regarding the Eliquis instructions.

## 2015-12-18 NOTE — Progress Notes (Signed)
Patient ID: Melinda Lucas, female   DOB: [birth year], 75 y.o.   MRN: [id number]   Subjective:    Patient ID: Melinda Lucas, female    DOB: 1941/06/27, 75 y.o.   MRN: [id number]  HPI Melinda Lucas is a pleasant 75 year old white female known to Dr. Henrene Pastor who comes in today to discuss recall colonoscopy. She is on chronic anticoagulation with eliquis. She is followed by Dr. Rayann Heman, has history of atrial fibrillation for which she underwent an ablation in the fall of 2016. She also has sick sinus syndrome.. Patient says she has been in sinus rhythm as far she is aware since the ablation. Last colonoscopy was done in February 2014 with finding of 3 polyps the largest was 10 mm. All were removed and passed consistent with tubular adenomas. Patient has family history of colon cancer in her mother diagnosed in her late 72s. Patient says the only thing she has noticed recently is some urgency for bowel movements. She says this happens sporadically and usually with passage of a somewhat soft stool. She's generally having 1 formed bowel movement per day no melena or hematochezia and denies any abdominal pain.  Review of Systems Pertinent positive and negative review of systems were noted in the above HPI section.  All other review of systems was otherwise negative.  Outpatient Encounter Prescriptions as of 12/18/2015  Medication Sig  . atorvastatin (LIPITOR) 20 MG tablet Take 20 mg by mouth daily.  . Coenzyme Q10 (CO Q 10 PO) Take 1 tablet by mouth daily.  Marland Kitchen dofetilide (TIKOSYN) 500 MCG capsule Take 1 capsule (500 mcg total) by mouth every 12 (twelve) hours.  Marland Kitchen ELIQUIS 5 MG TABS tablet Take 5 mg by mouth 2 (two) times daily.  . TURMERIC PO Take 1 capsule by mouth daily.  . Vitamin D, Ergocalciferol, (DRISDOL) 50000 UNITS CAPS Take 50,000 Units by mouth every 14 (fourteen) days.   . Na Sulfate-K Sulfate-Mg Sulf SOLN Take 1 kit by mouth once.   No facility-administered encounter medications on file as of 12/18/2015.     No Known Allergies Patient Active Problem List   Diagnosis Date Noted  . Sick sinus syndrome ([place]) 08/06/2015  . Obesity (BMI 30-39.9) 07/14/2015  . Paroxysmal atrial fibrillation ([facility])   . Long-term (current) use of anticoagulants   . Aortic arch atherosclerosis ([place])   . Hyperlipidemia    Social History   Social History  . Marital Status: Married    Spouse Name: Melinda Lucas  . Number of Children: Melinda Lucas  . Years of Education: Melinda Lucas   Occupational History  . Not on file.   Social History Main Topics  . Smoking status: Former Smoker    Types: Cigarettes  . Smokeless tobacco: Never Used     Comment: "quit smoking in ~ 1970"  . Alcohol Use: 8.4 oz/week    7 Standard drinks or equivalent, 7 Shots of liquor per week     Comment: 1 drink daily wine or liquor per pt  . Drug Use: No  . Sexual Activity: Not Currently   Other Topics Concern  . Not on file   Social History Narrative   Trains dogs currently, retired Cabin crew    Ms. Axtell's family history includes AAA (abdominal aortic aneurysm) in her father; Colon cancer (age of onset: 74) in her mother; Hypertension in her brother.      Objective:    Filed Vitals:   12/18/15 0910  BP: 118/78  Pulse: 88    Physical Exam  well-developed older white female in no acute distress, pleasant blood pressure 118/78 pulse 88 height 5 foot 5 weight 185. HEENT ;nontraumatic, cephalic EOMI PERRLA sclera anicteric, Cardiovascular; regular rate and rhythm with S1-S2 no murmur rub or gallop, Pulmonary; clear bilaterally, Abdomen ;soft nontender nondistended bowel sounds are active there is no palpable mass or hepatosplenomegaly Rectal ;exam not done, Ext; no clubbing cyanosis or edema skin warm and dry, Neuropsych; mood and affect appropriate     Assessment & Plan:   #54 75 year old female with history of adenomatous colon polyps due for follow-up colonoscopy #2 chronic anticoagulation-on Eliquis #3 family history of colon cancer in patient's  mother #4 history of atrial fibrillation status post ablation fall 2016 #5 history of sick sinus syndrome  Plan; patient will be scheduled for colonoscopy with Dr. Henrene Pastor procedure discussed in detail with patient and she is agreeable to proceed. We will communicate with her cardiologist Dr. Rayann Heman to assure that holding Eliquis for 24-48 hrs. prior to colonoscopy is acceptable for this patient.  Amy Genia Harold PA-C 12/18/2015   Cc: Marton Redwood, MD

## 2015-12-18 NOTE — Telephone Encounter (Signed)
  12/18/2015   RE: Melinda Lucas DOB: 1941/03/27 MRN: [id number]   Dear Dr. Thompson Grayer,    We have scheduled the above patient for an endoscopic procedure. Our records show that she is on anticoagulation therapy.   Please advise as to how long the patient may come off her therapy of Eliquis prior to the procedure, which is scheduled for 12-31-2015.  Please fax back/ or route the completed form to [facility] at [phone].   Sincerely,    Amy Esterwood PA-C

## 2015-12-18 NOTE — Progress Notes (Signed)
Agree with initial assessment and plans as outlined 

## 2015-12-23 NOTE — Telephone Encounter (Signed)
Will forward to anticoagulation clinic for recommendations per our office protocol

## 2015-12-24 NOTE — Telephone Encounter (Signed)
Pt has a CHADS score of 1.  Okay to hold Eliquis x 48 hours prior to procedure.  

## 2015-12-25 NOTE — Telephone Encounter (Signed)
Advised the patient she can hold the Eliquis 3-11, 12th, and 13th per Dr. Rayann Heman.  Informed the patient she can resume the Eliquis the day after the procedure unless Dr. Henrene Pastor tells her otherwise.

## 2015-12-31 ENCOUNTER — Encounter: Payer: Self-pay | Admitting: Internal Medicine

## 2015-12-31 ENCOUNTER — Ambulatory Visit (AMBULATORY_SURGERY_CENTER): Payer: Medicare Other | Admitting: Internal Medicine

## 2015-12-31 VITALS — BP 122/76 | HR 70 | Temp 96.6°F | Resp 22 | Ht 65.0 in | Wt 185.0 lb

## 2015-12-31 DIAGNOSIS — Z86010 Personal history of colonic polyps: Secondary | ICD-10-CM

## 2015-12-31 DIAGNOSIS — D122 Benign neoplasm of ascending colon: Secondary | ICD-10-CM

## 2015-12-31 DIAGNOSIS — I4891 Unspecified atrial fibrillation: Secondary | ICD-10-CM | POA: Diagnosis not present

## 2015-12-31 DIAGNOSIS — D120 Benign neoplasm of cecum: Secondary | ICD-10-CM | POA: Diagnosis not present

## 2015-12-31 DIAGNOSIS — E669 Obesity, unspecified: Secondary | ICD-10-CM | POA: Diagnosis not present

## 2015-12-31 DIAGNOSIS — G4733 Obstructive sleep apnea (adult) (pediatric): Secondary | ICD-10-CM | POA: Diagnosis not present

## 2015-12-31 MED ORDER — SODIUM CHLORIDE 0.9 % IV SOLN: 500.0000 mL | INTRAVENOUS | Status: DC

## 2015-12-31 NOTE — Progress Notes (Signed)
To recovery, report to [place], RN, VSS 

## 2015-12-31 NOTE — Op Note (Signed)
Melinda Lucas Patient Name: Melinda Lucas Procedure Date: 12/31/2015 9:44 AM MRN: [id number] Endoscopist: Docia Chuck. Henrene Pastor , MD Age: 75 Referring MD:  Date of Birth: 1941-03-13 Gender: Female Procedure:                Colonoscopy Indications:              Surveillance: Personal history of adenomatous                            polyps on last colonoscopy 3 years ago Medicines:                Monitored Anesthesia Care Procedure:                Pre-Anesthesia Assessment:                           - Prior to the procedure, a History and Physical                            was performed, and patient medications and                            allergies were reviewed. The patient's tolerance of                            previous anesthesia was also reviewed. The risks                            and benefits of the procedure and the sedation                            options and risks were discussed with the patient.                            All questions were answered, and informed consent                            was obtained. Prior Anticoagulants: The patient has                            taken Eliquis (apixaban), last dose was 3 days                            prior to procedure. ASA Grade Assessment: III - A                            patient with severe systemic disease. After                            reviewing the risks and benefits, the patient was                            deemed in satisfactory condition to undergo the  procedure.                           After obtaining informed consent, the colonoscope                            was passed under direct vision. Throughout the                            procedure, the patient's blood pressure, pulse, and                            oxygen saturations were monitored continuously. The                            Model CF-[id number]) scope was introduced                            through  the anus and advanced to the the cecum,                            identified by appendiceal orifice and ileocecal                            valve. The colonoscopy was performed without                            difficulty. The patient tolerated the procedure                            well. The quality of the bowel preparation was                            good. The bowel preparation used was SUPREP. The                            ileocecal valve, appendiceal orifice, and rectum                            were photographed. The quality of the bowel                            preparation was good. Scope In: 9:54:59 AM Scope Out: 10:10:57 AM Scope Withdrawal Time: 0 hours 12 minutes 35 seconds  Total Procedure Duration: 0 hours 15 minutes 58 seconds  Findings:      Two polyps were found in the ascending colon and cecum. The polyps were       2 to 5 mm in size. These polyps were removed with a cold snare.       Resection and retrieval were complete. Estimated blood loss: none.      Multiple diverticula were found in the left colon.      The exam was otherwise without abnormality on direct and retroflexion       views. Complications:            No  immediate complications. Estimated Blood Loss:     Estimated blood loss: none. Impression:               - Two 2 to 5 mm polyps in the ascending colon and                            in the cecum, removed with a cold snare. Resected                            and retrieved.                           - Diverticulosis in the left colon.                           - The examination was otherwise normal on direct                            and retroflexion views. Recommendation:           - Patient has a contact number available for                            emergencies. The signs and symptoms of potential                            delayed complications were discussed with the                            patient. Return to normal activities  tomorrow.                            Written discharge instructions were provided to the                            patient.                           - Resume previous diet.                           - Continue present medications.                           - Resume Eliquis (apixaban) at prior dose today.                           - Repeat colonoscopy in 5 years for surveillance. Procedure Code(s):        --- Professional ---                           [phone], Colonoscopy, flexible; with removal of                            tumor(s), polyp(s), or other lesion(s) by snare  technique CPT copyright 2016 [employer]. All rights reserved. Docia Chuck. Henrene Pastor, MD Docia Chuck. Henrene Pastor, MD 12/31/2015 10:21:41 AM This report has been signed electronically. Number of Addenda: 0

## 2015-12-31 NOTE — Progress Notes (Signed)
Redness noted to left upper arm upon admission,pt. Stated that she had on sweat shirt and blood pressure cuff stayed pumped up a long time,pt. Denies pain.

## 2015-12-31 NOTE — Patient Instructions (Addendum)
YOU HAD AN ENDOSCOPIC PROCEDURE TODAY AT [facility]:   Refer to the procedure report that was given to you for any specific questions about what was found during the examination.  If the procedure report does not answer your questions, please call your gastroenterologist to clarify.  If you requested that your care partner not be given the details of your procedure findings, then the procedure report has been included in a sealed envelope for you to review at your convenience later.  YOU SHOULD EXPECT: Some feelings of bloating in the abdomen. Passage of more gas than usual.  Walking can help get rid of the air that was put into your GI tract during the procedure and reduce the bloating. If you had a lower endoscopy (such as a colonoscopy or flexible sigmoidoscopy) you may notice spotting of blood in your stool or on the toilet paper. If you underwent a bowel prep for your procedure, you may not have a normal bowel movement for a few days.  Please Note:  You might notice some irritation and congestion in your nose or some drainage.  This is from the oxygen used during your procedure.  There is no need for concern and it should clear up in a day or so.  SYMPTOMS TO REPORT IMMEDIATELY:   Following lower endoscopy (colonoscopy or flexible sigmoidoscopy):  Excessive amounts of blood in the stool  Significant tenderness or worsening of abdominal pains  Swelling of the abdomen that is new, acute  Fever of 100F or higher   For urgent or emergent issues, a gastroenterologist can be reached at any hour by calling [phone].   DIET: Your first meal following the procedure should be a small meal and then it is ok to progress to your normal diet. Heavy or fried foods are harder to digest and may make you feel nauseous or bloated.  Likewise, meals heavy in dairy and vegetables can increase bloating.  Drink plenty of fluids but you should avoid alcoholic beverages for 24 hours. Try to  increase the fiber in your diet.  Drink plenty of water.  ACTIVITY:  You should plan to take it easy for the rest of today and you should NOT DRIVE or use heavy machinery until tomorrow (because of the sedation medicines used during the test).    FOLLOW UP: Our staff will call the number listed on your records the next business day following your procedure to check on you and address any questions or concerns that you may have regarding the information given to you following your procedure. If we do not reach you, we will leave a message.  However, if you are feeling well and you are not experiencing any problems, there is no need to return our call.  We will assume that you have returned to your regular daily activities without incident.  If any biopsies were taken you will be contacted by phone or by letter within the next 1-3 weeks.  Please call us at [phone] if you have not heard about the biopsies in 3 weeks.    SIGNATURES/CONFIDENTIALITY: You and/or your care partner have signed paperwork which will be entered into your electronic medical record.  These signatures attest to the fact that that the information above on your After Visit Summary has been reviewed and is understood.  Full responsibility of the confidentiality of this discharge information lies with you and/or your care-partner.  Be sure to read all of the handouts given to you  by your recovery room.  Thank-you for choosing [place] for your healthcare needs today.  May start anticoagulant back today.

## 2015-12-31 NOTE — Progress Notes (Signed)
Called to room to assist during endoscopic procedure.  Patient ID and intended procedure confirmed with present staff. Received instructions for my participation in the procedure from the performing physician.  

## 2016-01-01 ENCOUNTER — Telehealth: Payer: Self-pay | Admitting: *Deleted

## 2016-01-01 NOTE — Telephone Encounter (Signed)
  Follow up Call-  Call back number 12/31/2015  Post procedure Call Back phone  # [phone]  Permission to leave phone message Yes     Patient questions:  Do you have a fever, pain , or abdominal swelling? No. Pain Score  0 *  Have you tolerated food without any problems? Yes.    Have you been able to return to your normal activities? Yes.    Do you have any questions about your discharge instructions: Diet   No. Medications  No. Follow up visit  No.  Do you have questions or concerns about your Care? No.  Actions: * If pain score is 4 or above: No action needed, pain <4.  Spoke with patients husband and he reports no problems.

## 2016-01-04 ENCOUNTER — Encounter: Payer: Self-pay | Admitting: Internal Medicine

## 2016-01-06 ENCOUNTER — Encounter (HOSPITAL_COMMUNITY): Payer: Self-pay | Admitting: *Deleted

## 2016-01-06 ENCOUNTER — Telehealth: Payer: Self-pay | Admitting: Gastroenterology

## 2016-01-06 ENCOUNTER — Emergency Department (HOSPITAL_COMMUNITY): Payer: Medicare Other

## 2016-01-06 ENCOUNTER — Emergency Department (HOSPITAL_COMMUNITY)
Admission: EM | Admit: 2016-01-06 | Discharge: 2016-01-06 | Disposition: A | Discharge status: Home / Self Care | Payer: Medicare Other | Attending: Emergency Medicine | Admitting: Emergency Medicine

## 2016-01-06 DIAGNOSIS — N23 Unspecified renal colic: Secondary | ICD-10-CM | POA: Insufficient documentation

## 2016-01-06 DIAGNOSIS — R011 Cardiac murmur, unspecified: Secondary | ICD-10-CM | POA: Insufficient documentation

## 2016-01-06 DIAGNOSIS — Z8679 Personal history of other diseases of the circulatory system: Secondary | ICD-10-CM | POA: Insufficient documentation

## 2016-01-06 DIAGNOSIS — Z8669 Personal history of other diseases of the nervous system and sense organs: Secondary | ICD-10-CM | POA: Insufficient documentation

## 2016-01-06 DIAGNOSIS — Z79899 Other long term (current) drug therapy: Secondary | ICD-10-CM | POA: Diagnosis not present

## 2016-01-06 DIAGNOSIS — Z85820 Personal history of malignant melanoma of skin: Secondary | ICD-10-CM | POA: Diagnosis not present

## 2016-01-06 DIAGNOSIS — Z9842 Cataract extraction status, left eye: Secondary | ICD-10-CM | POA: Diagnosis not present

## 2016-01-06 DIAGNOSIS — K5730 Diverticulosis of large intestine without perforation or abscess without bleeding: Secondary | ICD-10-CM | POA: Diagnosis not present

## 2016-01-06 DIAGNOSIS — Z8719 Personal history of other diseases of the digestive system: Secondary | ICD-10-CM | POA: Diagnosis not present

## 2016-01-06 DIAGNOSIS — K8020 Calculus of gallbladder without cholecystitis without obstruction: Secondary | ICD-10-CM | POA: Diagnosis not present

## 2016-01-06 DIAGNOSIS — Z9841 Cataract extraction status, right eye: Secondary | ICD-10-CM | POA: Insufficient documentation

## 2016-01-06 DIAGNOSIS — Z87891 Personal history of nicotine dependence: Secondary | ICD-10-CM | POA: Insufficient documentation

## 2016-01-06 DIAGNOSIS — K449 Diaphragmatic hernia without obstruction or gangrene: Secondary | ICD-10-CM | POA: Diagnosis not present

## 2016-01-06 DIAGNOSIS — M1990 Unspecified osteoarthritis, unspecified site: Secondary | ICD-10-CM | POA: Diagnosis not present

## 2016-01-06 DIAGNOSIS — E669 Obesity, unspecified: Secondary | ICD-10-CM | POA: Insufficient documentation

## 2016-01-06 DIAGNOSIS — R1084 Generalized abdominal pain: Secondary | ICD-10-CM | POA: Diagnosis present

## 2016-01-06 DIAGNOSIS — N132 Hydronephrosis with renal and ureteral calculous obstruction: Secondary | ICD-10-CM | POA: Diagnosis not present

## 2016-01-06 LAB — CBC
HEMATOCRIT: 41.6 % (ref 36.0–46.0)
Hemoglobin: 13.5 g/dL (ref 12.0–15.0)
MCH: 31 pg (ref 26.0–34.0)
MCHC: 32.5 g/dL (ref 30.0–36.0)
MCV: 95.4 fL (ref 78.0–100.0)
Platelets: 267 10*3/uL (ref 150–400)
RBC: 4.36 MIL/uL (ref 3.87–5.11)
RDW: 13.3 % (ref 11.5–15.5)
WBC: 5 10*3/uL (ref 4.0–10.5)

## 2016-01-06 LAB — URINE MICROSCOPIC-ADD ON

## 2016-01-06 LAB — COMPREHENSIVE METABOLIC PANEL
ALBUMIN: 3.5 g/dL (ref 3.5–5.0)
ALT: 25 U/L (ref 14–54)
AST: 24 U/L (ref 15–41)
Alkaline Phosphatase: 133 U/L — ABNORMAL HIGH (ref 38–126)
Anion gap: 13 (ref 5–15)
BUN: 10 mg/dL (ref 6–20)
CHLORIDE: 106 mmol/L (ref 101–111)
CO2: 25 mmol/L (ref 22–32)
Calcium: 9.6 mg/dL (ref 8.9–10.3)
Creatinine, Ser: 0.74 mg/dL (ref 0.44–1.00)
GFR calc Af Amer: >60 mL/min (ref >60)
Glucose, Bld: 162 mg/dL — ABNORMAL HIGH (ref 65–99)
POTASSIUM: 3.8 mmol/L (ref 3.5–5.1)
SODIUM: 144 mmol/L (ref 135–145)
Total Bilirubin: 0.5 mg/dL (ref 0.3–1.2)
Total Protein: 6.3 g/dL — ABNORMAL LOW (ref 6.5–8.1)

## 2016-01-06 LAB — URINALYSIS, ROUTINE W REFLEX MICROSCOPIC
GLUCOSE, UA: NEGATIVE mg/dL
KETONES UR: 15 mg/dL — AB
Nitrite: NEGATIVE
PH: 5 (ref 5.0–8.0)
Protein, ur: 30 mg/dL — AB
Specific Gravity, Urine: 1.024 (ref 1.005–1.030)

## 2016-01-06 LAB — LIPASE, BLOOD: LIPASE: 26 U/L (ref 11–51)

## 2016-01-06 MED ORDER — ONDANSETRON HCL 4 MG/2ML IJ SOLN
4.0000 mg | Freq: Once | INTRAMUSCULAR | Status: AC
  Administered 2016-01-06: 4 mg via INTRAVENOUS
  Filled 2016-01-06: qty 2

## 2016-01-06 MED ORDER — OXYCODONE-ACETAMINOPHEN 5-325 MG PO TABS: 1.0000 | ORAL_TABLET | ORAL | Status: DC | PRN

## 2016-01-06 MED ORDER — TAMSULOSIN HCL 0.4 MG PO CAPS
0.4000 mg | ORAL_CAPSULE | Freq: Every day | ORAL | Status: DC
Start: 2016-01-06 — End: 2016-02-11

## 2016-01-06 MED ORDER — HYDROMORPHONE HCL 1 MG/ML IJ SOLN
1.0000 mg | Freq: Once | INTRAMUSCULAR | Status: AC
  Administered 2016-01-06: 1 mg via INTRAVENOUS
  Filled 2016-01-06: qty 1

## 2016-01-06 NOTE — Telephone Encounter (Signed)
She called tonight.  Colonoscopy 5 days ago, no abd pains or other GI problems afterwards until she was awoken this evening with acute lower abd pains and bloody urine.  Pains are constant.  Shes never had kidney stones before but it seems like she may now.  She sounds very uncomfortable on the phone.  I recommended she go to the ER for evaluation.

## 2016-01-06 NOTE — ED Provider Notes (Signed)
CSN: [id number]     Arrival date & time 01/06/16  0119 History   By signing my name below, I, Rowan Blase, attest that this documentation has been prepared under the direction and in the presence of Orpah Greek, MD . Electronically Signed: Rowan Blase, Scribe. 01/06/2016. 2:12 AM.    Chief Complaint  Patient presents with  . Abdominal Pain   The history is provided by the patient. No language interpreter was used.   HPI Comments:  Melinda Lucas is a 75 y.o. female with PMHx of a-fib and GERD who presents to the Emergency Department complaining of generalized, moderate abdominal pain, localizing to left side just PTA. She states abdominal pain is currently alleviated. Pt reports associated nausea, an episode of vomiting yesterday evening, hematuria, and increased frequency yesterday. She notes bright red urine at home and brown urine sample in ED. Pt also reports colonoscopy Monday with dx of diverticulitis. Pt denies vomiting and dysuria.  Past Medical History  Diagnosis Date  . Melanoma in situ of lower leg ([place])   . Obesity (BMI 30-39.9)   . Atrial fibrillation ([place])   . Heart murmur   . GERD (gastroesophageal reflux disease)   . Arthritis     "a little in my fingers" (08/09/2015)  . Obstructive sleep apnea   . Cataract     REMOVED BILATERAL  . Sleep apnea   . Osteopenia    Past Surgical History  Procedure Laterality Date  . Melanoma excision Left 2010    leg  . Wrist fracture surgery Left 2011  . Fracture surgery    . Dilation and curettage of uterus  1970's  . Tubal ligation  1970's  . Cataract extraction w/ intraocular lens  implant, bilateral Bilateral ~ 2014  . Electrophysiologic study N/A 08/09/2015    Procedure: Atrial Fibrillation Ablation;  Surgeon: Thompson Grayer, MD;  Location: [place] [facility];  Service: Cardiovascular;  Laterality: N/A;  . Tee without cardioversion N/A 08/09/2015    Procedure: TRANSESOPHAGEAL ECHOCARDIOGRAM (TEE);  Surgeon:  Jerline Pain, MD;  Location[facility];  Service: Cardiovascular;  Laterality: N/A;  . Colonoscopy     Family History  Problem Relation Age of Onset  . Colon cancer Mother 98  . AAA (abdominal aortic aneurysm) Father   . Hypertension Brother    Social History  Substance Use Topics  . Smoking status: Former Smoker    Types: Cigarettes  . Smokeless tobacco: Never Used     Comment: "quit smoking in ~ 1970"  . Alcohol Use: 8.4 oz/week    7 Standard drinks or equivalent, 7 Shots of liquor per week     Comment: 1 drink daily wine or liquor per pt   OB History    No data available     Review of Systems  Gastrointestinal: Positive for nausea and abdominal pain. Negative for vomiting.  Genitourinary: Positive for frequency and hematuria. Negative for dysuria.  All other systems reviewed and are negative.  Allergies  Review of patient's allergies indicates no known allergies.  Home Medications   Prior to Admission medications   Medication Sig Start Date End Date Taking? Authorizing Provider  atorvastatin (LIPITOR) 20 MG tablet Take 20 mg by mouth daily. 07/07/15  Yes Historical Provider, MD  Coenzyme Q10 (CO Q 10 PO) Take 1 tablet by mouth once a week.    Yes Historical Provider, MD  dofetilide (TIKOSYN) 500 MCG capsule Take 1 capsule (500 mcg total) by mouth every 12 (twelve)  hours. 07/17/15  Yes Jacolyn Reedy, MD  ELIQUIS 5 MG TABS tablet Take 5 mg by mouth 2 (two) times daily. 06/24/15  Yes Historical Provider, MD  TURMERIC PO Take 1 capsule by mouth once a week.    Yes Historical Provider, MD  Vitamin D, Ergocalciferol, (DRISDOL) 50000 UNITS CAPS Take 50,000 Units by mouth every 14 (fourteen) days.    Yes Historical Provider, MD  oxyCODONE-acetaminophen (PERCOCET) 5-325 MG tablet Take 1 tablet by mouth every 4 (four) hours as needed. 01/06/16   Orpah Greek, MD  tamsulosin (FLOMAX) 0.4 MG CAPS capsule Take 1 capsule (0.4 mg total) by mouth daily. 01/06/16   Orpah Greek, MD   BP 143/69 mmHg  Pulse 81  Temp(Src) 98.4 F (36.9 C) (Oral)  Resp 18  SpO2 95% Physical Exam  Constitutional: She is oriented to person, place, and time. She appears well-developed and well-nourished. No distress.  HENT:  Head: Normocephalic and atraumatic.  Right Ear: Hearing normal.  Left Ear: Hearing normal.  Nose: Nose normal.  Mouth/Throat: Oropharynx is clear and moist and mucous membranes are normal.  Eyes: Conjunctivae and EOM are normal. Pupils are equal, round, and reactive to light.  Neck: Normal range of motion. Neck supple.  Cardiovascular: Regular rhythm, S1 normal and S2 normal.  Exam reveals no gallop and no friction rub.   No murmur heard. Pulmonary/Chest: Effort normal and breath sounds normal. No respiratory distress. She exhibits no tenderness.  Abdominal: Soft. Normal appearance and bowel sounds are normal. There is no hepatosplenomegaly. There is tenderness. There is no rebound, no guarding, no tenderness at McBurney's point and negative Murphy's sign. No hernia.  Mild and diffuse lower abdominal TTP, left greater than right  Musculoskeletal: Normal range of motion.  Neurological: She is alert and oriented to person, place, and time. She has normal strength. No cranial nerve deficit or sensory deficit. Coordination normal. GCS eye subscore is 4. GCS verbal subscore is 5. GCS motor subscore is 6.  Skin: Skin is warm, dry and intact. No rash noted. No cyanosis.  Psychiatric: She has a normal mood and affect. Her speech is normal and behavior is normal. Thought content normal.  Nursing note and vitals reviewed.  ED Course  Procedures  DIAGNOSTIC STUDIES:  Oxygen Saturation is 96% on RA, normal by my interpretation.    COORDINATION OF CARE:  1:55 AM Discussed treatment plan with pt at bedside and pt agreed to plan.  Labs Review Labs Reviewed  COMPREHENSIVE METABOLIC PANEL - Abnormal; Notable for the following:    Glucose, Bld 162 (*)    Total  Protein 6.3 (*)    Alkaline Phosphatase 133 (*)    All other components within normal limits  URINALYSIS, ROUTINE W REFLEX MICROSCOPIC (NOT AT [facility]) - Abnormal; Notable for the following:    Color, Urine AMBER (*)    APPearance TURBID (*)    Hgb urine dipstick LARGE (*)    Bilirubin Urine SMALL (*)    Ketones, ur 15 (*)    Protein, ur 30 (*)    Leukocytes, UA SMALL (*)    All other components within normal limits  URINE MICROSCOPIC-ADD ON - Abnormal; Notable for the following:    Squamous Epithelial / LPF 0-5 (*)    Bacteria, UA MANY (*)    All other components within normal limits  LIPASE, BLOOD  CBC    Imaging Review Ct Renal Stone Study  01/06/2016  CLINICAL DATA:  Central abdominal pain, onset  at 23:30.  Hematuria. EXAM: CT ABDOMEN AND PELVIS WITHOUT CONTRAST TECHNIQUE: Multidetector CT imaging of the abdomen and pelvis was performed following the standard protocol without IV contrast. COMPARISON:  None. FINDINGS: There is a 3 x 5 mm left ureteral calculus just beyond the iliac vascular crossing, with moderate hydroureter and hydronephrosis. There is a 2 mm lower pole left collecting system calculus. No other urinary calculi are evident. No other acute findings are evident. There are unremarkable unenhanced appearances of the liver and bile ducts. There is a 9 mm stone within the contracted gallbladder. There are unremarkable unenhanced appearances of the spleen, pancreas, adrenals and right kidney. The abdominal aorta is normal in caliber. There is mild atherosclerotic calcification. There is no adenopathy in the abdomen or pelvis. There is a large hiatal hernia. Bowel is otherwise unremarkable except for extensive colonic diverticulosis. The uterus and ovaries are unremarkable. There is no significant abnormality in the lower chest. There is no significant skeletal lesion. There is moderately severe degenerative lumbar disc and facet disease from L4 through S1. IMPRESSION: 1. Obstructing  3 x 5 mm left ureteral calculus just below the iliac vasculature with moderate hydronephrosis. 2. Lower pole left nephrolithiasis. 3. Diverticulosis. 4. Hiatal hernia. 5. Cholelithiasis. Electronically Signed   By: Andreas Newport M.D.   On: 01/06/2016 02:32   I have personally reviewed and evaluated these images and lab results as part of my medical decision-making.   EKG Interpretation None      MDM   Final diagnoses:  Renal colic on left side    Patient presents to the ER for evaluation of abdominal pain. Patient noted onset of diffuse lower abdominal pain with hematuria earlier today. Pain is now localized to the left side. Workup reveals 3 mm x 5 mm ureteral stone with hydronephrosis. Patient has had resolution of pain with treatment. She will be discharged, follow-up with urology as an outpatient. Percocet and Flomax prescribed. Patient counseled to go to [facility] emergency department if she has intensifying pain.  I personally performed the services described in this documentation, which was scribed in my presence. The recorded information has been reviewed and is accurate.    Orpah Greek, MD 01/06/16 [phone]

## 2016-01-06 NOTE — ED Notes (Signed)
Patient transported to CT 

## 2016-01-06 NOTE — ED Notes (Signed)
The pt is c/o abd pain central since 2330.  Bloody urine  She had a  Colonoscopy one month ago  Nauseated no vomiting

## 2016-01-06 NOTE — Discharge Instructions (Signed)
Kidney Stones °Kidney stones (urolithiasis) are deposits that form inside your kidneys. The intense pain is caused by the stone moving through the urinary tract. When the stone moves, the ureter goes into spasm around the stone. The stone is usually passed in the urine.  °CAUSES  °· A disorder that makes certain neck glands produce too much parathyroid hormone (primary hyperparathyroidism). °· A buildup of uric acid crystals, similar to gout in your joints. °· Narrowing (stricture) of the ureter. °· A kidney obstruction present at birth (congenital obstruction). °· Previous surgery on the kidney or ureters. °· Numerous kidney infections. °SYMPTOMS  °· Feeling sick to your stomach (nauseous). °· Throwing up (vomiting). °· Blood in the urine (hematuria). °· Pain that usually spreads (radiates) to the groin. °· Frequency or urgency of urination. °DIAGNOSIS  °· Taking a history and physical exam. °· Blood or urine tests. °· CT scan. °· Occasionally, an examination of the inside of the urinary bladder (cystoscopy) is performed. °TREATMENT  °· Observation. °· Increasing your fluid intake. °· Extracorporeal shock wave lithotripsy--This is a noninvasive procedure that uses shock waves to break up kidney stones. °· Surgery may be needed if you have severe pain or persistent obstruction. There are various surgical procedures. Most of the procedures are performed with the use of small instruments. Only small incisions are needed to accommodate these instruments, so recovery time is minimized. °The size, location, and chemical composition are all important variables that will determine the proper choice of action for you. Talk to your health care provider to better understand your situation so that you will minimize the risk of injury to yourself and your kidney.  °HOME CARE INSTRUCTIONS  °· Drink enough water and fluids to keep your urine clear or pale yellow. This will help you to pass the stone or stone fragments. °· Strain  all urine through the provided strainer. Keep all particulate matter and stones for your health care provider to see. The stone causing the pain may be as small as a grain of salt. It is very important to use the strainer each and every time you pass your urine. The collection of your stone will allow your health care provider to analyze it and verify that a stone has actually passed. The stone analysis will often identify what you can do to reduce the incidence of recurrences. °· Only take over-the-counter or prescription medicines for pain, discomfort, or fever as directed by your health care provider. °· Keep all follow-up visits as told by your health care provider. This is important. °· Get follow-up X-rays if required. The absence of pain does not always mean that the stone has passed. It may have only stopped moving. If the urine remains completely obstructed, it can cause loss of kidney function or even complete destruction of the kidney. It is your responsibility to make sure X-rays and follow-ups are completed. Ultrasounds of the kidney can show blockages and the status of the kidney. Ultrasounds are not associated with any radiation and can be performed easily in a matter of minutes. °· Make changes to your daily diet as told by your health care provider. You may be told to: °¨ Limit the amount of salt that you eat. °¨ Eat 5 or more servings of fruits and vegetables each day. °¨ Limit the amount of meat, poultry, fish, and eggs that you eat. °· Collect a 24-hour urine sample as told by your health care provider. You may need to collect another urine sample every 6-12   months. °SEEK MEDICAL CARE IF: °· You experience pain that is progressive and unresponsive to any pain medicine you have been prescribed. °SEEK IMMEDIATE MEDICAL CARE IF:  °· Pain cannot be controlled with the prescribed medicine. °· You have a fever or shaking chills. °· The severity or intensity of pain increases over 18 hours and is not  relieved by pain medicine. °· You develop a new onset of abdominal pain. °· You feel faint or pass out. °· You are unable to urinate. °  °This information is not intended to replace advice given to you by your health care provider. Make sure you discuss any questions you have with your health care provider. °  °Document Released: 10/06/2005 Document Revised: 06/27/2015 Document Reviewed: 03/09/2013 °Elsevier Interactive Patient Education ©2016 [employer] ° °

## 2016-01-07 DIAGNOSIS — Z0000 Encounter for general adult medical examination without abnormal findings: Secondary | ICD-10-CM | POA: Diagnosis not present

## 2016-01-07 DIAGNOSIS — N201 Calculus of ureter: Secondary | ICD-10-CM | POA: Diagnosis not present

## 2016-01-15 DIAGNOSIS — Z7901 Long term (current) use of anticoagulants: Secondary | ICD-10-CM | POA: Diagnosis not present

## 2016-01-15 DIAGNOSIS — G4733 Obstructive sleep apnea (adult) (pediatric): Secondary | ICD-10-CM | POA: Diagnosis not present

## 2016-01-15 DIAGNOSIS — E668 Other obesity: Secondary | ICD-10-CM | POA: Diagnosis not present

## 2016-01-15 DIAGNOSIS — I480 Paroxysmal atrial fibrillation: Secondary | ICD-10-CM | POA: Diagnosis not present

## 2016-01-21 DIAGNOSIS — N201 Calculus of ureter: Secondary | ICD-10-CM | POA: Diagnosis not present

## 2016-01-21 DIAGNOSIS — Z0000 Encounter for general adult medical examination without abnormal findings: Secondary | ICD-10-CM | POA: Diagnosis not present

## 2016-01-28 ENCOUNTER — Encounter: Payer: Self-pay | Admitting: Internal Medicine

## 2016-02-11 ENCOUNTER — Encounter: Payer: Self-pay | Admitting: Internal Medicine

## 2016-02-11 ENCOUNTER — Ambulatory Visit (INDEPENDENT_AMBULATORY_CARE_PROVIDER_SITE_OTHER): Payer: Medicare Other | Admitting: Internal Medicine

## 2016-02-11 VITALS — BP 142/76 | HR 88 | Ht 65.0 in | Wt 185.6 lb

## 2016-02-11 DIAGNOSIS — I480 Paroxysmal atrial fibrillation: Secondary | ICD-10-CM | POA: Diagnosis not present

## 2016-02-11 DIAGNOSIS — I495 Sick sinus syndrome: Secondary | ICD-10-CM | POA: Diagnosis not present

## 2016-02-11 NOTE — Patient Instructions (Signed)
Medication Instructions:  Stop Tikosyn once finished with current bottle  Labwork: None ordered   Testing/Procedures: None ordered   Follow-Up: Your physician wants you to follow-up in: 6 months with Dr Rayann Heman Dennis Bast will receive a reminder letter in the mail two months in advance. If you don't receive a letter, please call our office to schedule the follow-up appointment.   Any Other Special Instructions Will Be Listed Below (If Applicable).     If you need a refill on your cardiac medications before your next appointment, please call your pharmacy.

## 2016-02-11 NOTE — Progress Notes (Signed)
Electrophysiology Office Note   Date:  02/11/2016   ID:  Melinda SASNETT, DOB November 19, 1940, MRN [id number]  PCP:  Marton Redwood, MD  Cardiologist:  Dr. Wynonia Lawman Primary Electrophysiologist: Dr. Rayann Heman    Chief Complaint  Patient presents with  . Atrial Fibrillation     History of Present Illness: Melinda Lucas is a 75 y.o. female who presents today for electrophysiology evaluation.    She has done very well post ablation.  No further AF.   No symptomatic pauses. Today, she denies symptoms of palpitations, chest pain, shortness of breath, orthopnea, PND, lower extremity edema, claudication, dizziness, presyncope, syncope, bleeding, or neurologic sequela. The patient is tolerating medications without difficulties and is otherwise without complaint today.    Past Medical History  Diagnosis Date  . Melanoma in situ of lower leg (Melinda Lucas)   . Obesity (BMI 30-39.9)   . Atrial fibrillation (Melinda Lucas)   . Heart murmur   . GERD (gastroesophageal reflux disease)   . Arthritis     "a little in my fingers" (08/09/2015)  . Obstructive sleep apnea   . Cataract     REMOVED BILATERAL  . Sleep apnea   . Osteopenia    Past Surgical History  Procedure Laterality Date  . Melanoma excision Left 2010    leg  . Wrist fracture surgery Left 2011  . Fracture surgery    . Dilation and curettage of uterus  1970's  . Tubal ligation  1970's  . Cataract extraction w/ intraocular lens  implant, bilateral Bilateral ~ 2014  . Electrophysiologic study N/A 08/09/2015    Procedure: Atrial Fibrillation Ablation;  Surgeon: Thompson Grayer, MD;  Location: [facility];  Service: Cardiovascular;  Laterality: N/A;  . Tee without cardioversion N/A 08/09/2015    Procedure: TRANSESOPHAGEAL ECHOCARDIOGRAM (TEE);  Surgeon: Jerline Pain, MD;  Location: [facility];  Service: Cardiovascular;  Laterality: N/A;  . Colonoscopy       Current Outpatient Prescriptions  Medication Sig Dispense Refill  . atorvastatin  (LIPITOR) 20 MG tablet Take 20 mg by mouth daily.    . Coenzyme Q10 (CO Q 10 PO) Take 1 tablet by mouth once a week.     Marland Kitchen ELIQUIS 5 MG TABS tablet Take 5 mg by mouth 2 (two) times daily.  12  . TURMERIC PO Take 1 capsule by mouth once a week.     . Vitamin D, Ergocalciferol, (DRISDOL) 50000 UNITS CAPS Take 50,000 Units by mouth every 14 (fourteen) days.      No current facility-administered medications for this visit.    Allergies:   Review of patient's allergies indicates no known allergies.   Social History:  The patient  reports that she has quit smoking. Her smoking use included Cigarettes. She has never used smokeless tobacco. She reports that she drinks about 8.4 oz of alcohol per week. She reports that she does not use illicit drugs.   Family History:  The patient's  family history includes AAA (abdominal aortic aneurysm) in her father; Colon cancer (age of onset: 83) in her mother; Hypertension in her brother.    ROS:  Please see the history of present illness.   All other systems are reviewed and negative.    PHYSICAL EXAM: VS:  BP 142/76 mmHg  Pulse 88  Ht 5\' 5"  (1.651 m)  Wt 185 lb 9.6 oz (84.188 kg)  BMI 30.89 kg/m2 , BMI Body mass index is 30.89 kg/(m^2). GEN: Well nourished, well developed, in no acute  distress HEENT: normal Neck: no JVD, carotid bruits, or masses Cardiac: RRR; no significant murmurs, rubs, or gallops,no edema  Respiratory:  clear to auscultation bilaterally, normal work of breathing GI: soft, nontender, nondistended, + BS MS: no deformity or atrophy Skin: warm and dry  Neuro:  Strength and sensation are intact Psych: euthymic mood, full affect  EKG:  EKG is ordered today. The ekg ordered today shows SR   Recent Labs: 07/14/2015: TSH 2.491 07/17/2015: Magnesium 1.9 01/06/2016: ALT 25; BUN 10; Creatinine, Ser 0.74; Hemoglobin 13.5; Platelets 267; Potassium 3.8; Sodium 144    Lipid Panel  No results found for: CHOL, TRIG, HDL, CHOLHDL, VLDL,  LDLCALC, LDLDIRECT   Wt Readings from Last 3 Encounters:  02/11/16 185 lb 9.6 oz (84.188 kg)  12/31/15 185 lb (83.915 kg)  12/18/15 185 lb (83.915 kg)      ASSESSMENT AND PLAN:  1.  PAFib  CHADS2Vasc is 3 on Eliquis and Tikosyn.   Doing very well s/p ablation She is willing to stop tikosyn at this time  2. Sick sinus syndrome Post termination pauses resolved post ablation No indication for pacing at this time Resume normal activity  3. Obesity Body mass index is 30.89 kg/(m^2). Regular exercise and weight loss advised  4. OSA Followed by Dr Wynonia Lawman  Current medicines are reviewed at length with the patient today.   The patient does not have concerns regarding her medicines.  The following changes were made today:  None   Follow-up with Dr Wynonia Lawman as scheduled  Return to see me in 6 months  Signed, Thompson Grayer MD 02/11/2016 2:46 PM     [facility] [address] [place] Lucas [address] [phone] (office) [phone] (fax)

## 2016-02-20 DIAGNOSIS — R7301 Impaired fasting glucose: Secondary | ICD-10-CM | POA: Diagnosis not present

## 2016-02-20 DIAGNOSIS — E784 Other hyperlipidemia: Secondary | ICD-10-CM | POA: Diagnosis not present

## 2016-02-20 DIAGNOSIS — N390 Urinary tract infection, site not specified: Secondary | ICD-10-CM | POA: Diagnosis not present

## 2016-02-20 DIAGNOSIS — R8299 Other abnormal findings in urine: Secondary | ICD-10-CM | POA: Diagnosis not present

## 2016-02-20 DIAGNOSIS — E559 Vitamin D deficiency, unspecified: Secondary | ICD-10-CM | POA: Diagnosis not present

## 2016-02-26 DIAGNOSIS — I480 Paroxysmal atrial fibrillation: Secondary | ICD-10-CM | POA: Diagnosis not present

## 2016-02-26 DIAGNOSIS — R7301 Impaired fasting glucose: Secondary | ICD-10-CM | POA: Diagnosis not present

## 2016-02-26 DIAGNOSIS — Z6831 Body mass index (BMI) 31.0-31.9, adult: Secondary | ICD-10-CM | POA: Diagnosis not present

## 2016-02-26 DIAGNOSIS — Z0000 Encounter for general adult medical examination without abnormal findings: Secondary | ICD-10-CM | POA: Diagnosis not present

## 2016-02-26 DIAGNOSIS — E559 Vitamin D deficiency, unspecified: Secondary | ICD-10-CM | POA: Diagnosis not present

## 2016-02-26 DIAGNOSIS — Z86010 Personal history of colonic polyps: Secondary | ICD-10-CM | POA: Diagnosis not present

## 2016-02-26 DIAGNOSIS — E668 Other obesity: Secondary | ICD-10-CM | POA: Diagnosis not present

## 2016-02-26 DIAGNOSIS — M859 Disorder of bone density and structure, unspecified: Secondary | ICD-10-CM | POA: Diagnosis not present

## 2016-02-26 DIAGNOSIS — E784 Other hyperlipidemia: Secondary | ICD-10-CM | POA: Diagnosis not present

## 2016-02-26 DIAGNOSIS — Z7901 Long term (current) use of anticoagulants: Secondary | ICD-10-CM | POA: Diagnosis not present

## 2016-02-26 DIAGNOSIS — E781 Pure hyperglyceridemia: Secondary | ICD-10-CM | POA: Diagnosis not present

## 2016-02-26 DIAGNOSIS — Z1389 Encounter for screening for other disorder: Secondary | ICD-10-CM | POA: Diagnosis not present

## 2016-03-18 DIAGNOSIS — Z85820 Personal history of malignant melanoma of skin: Secondary | ICD-10-CM | POA: Diagnosis not present

## 2016-03-18 DIAGNOSIS — L814 Other melanin hyperpigmentation: Secondary | ICD-10-CM | POA: Diagnosis not present

## 2016-03-18 DIAGNOSIS — L821 Other seborrheic keratosis: Secondary | ICD-10-CM | POA: Diagnosis not present

## 2016-03-18 DIAGNOSIS — L570 Actinic keratosis: Secondary | ICD-10-CM | POA: Diagnosis not present

## 2016-03-18 DIAGNOSIS — D1801 Hemangioma of skin and subcutaneous tissue: Secondary | ICD-10-CM | POA: Diagnosis not present

## 2016-03-18 DIAGNOSIS — L738 Other specified follicular disorders: Secondary | ICD-10-CM | POA: Diagnosis not present

## 2016-03-18 DIAGNOSIS — Z85828 Personal history of other malignant neoplasm of skin: Secondary | ICD-10-CM | POA: Diagnosis not present

## 2016-03-18 DIAGNOSIS — L720 Epidermal cyst: Secondary | ICD-10-CM | POA: Diagnosis not present

## 2016-03-27 IMAGING — CT CT RENAL STONE PROTOCOL
2 of 4 series · 12 of 46 positions shown, 14 images · non-contrast
Comparison: None.

CLINICAL DATA: Central abdominal pain, onset at [DATE].  Hematuria.

EXAM:
CT ABDOMEN AND PELVIS WITHOUT CONTRAST
TECHNIQUE: Multidetector CT imaging of the abdomen and pelvis was performed
following the standard protocol without IV contrast.

[Series 201: stone study, idose (2) · axial · 0.68mm/px · z∈[+62,+412]mm · 9 of 86 slices shown, 11 images]
[im 8/86  soft-tissue]
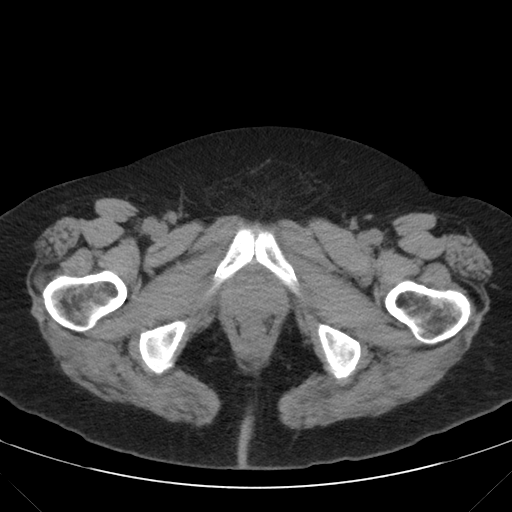
[im 8/86  bone]
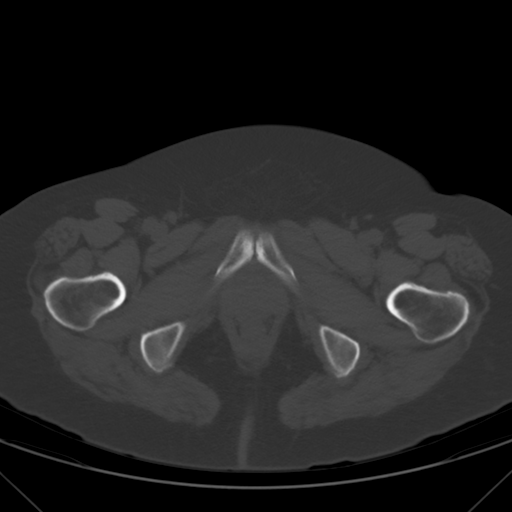
[im 15/86  soft-tissue]
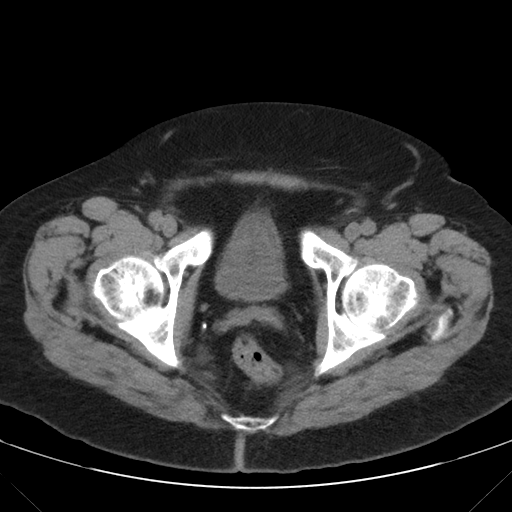
[im 26/86  soft-tissue]
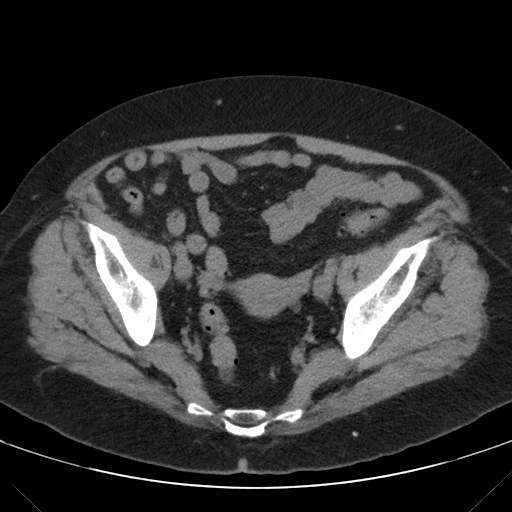
[im 34/86  soft-tissue]
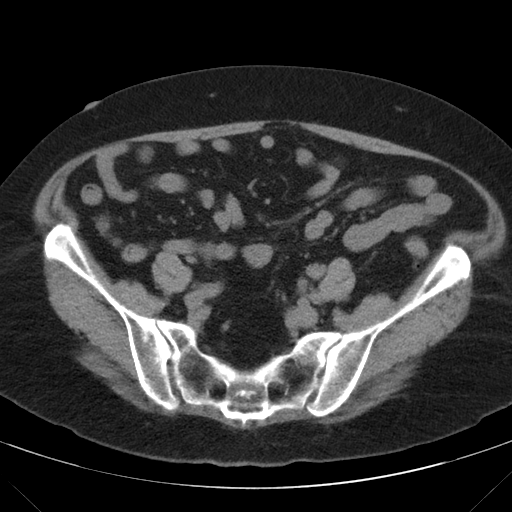
[im 45/86  soft-tissue]
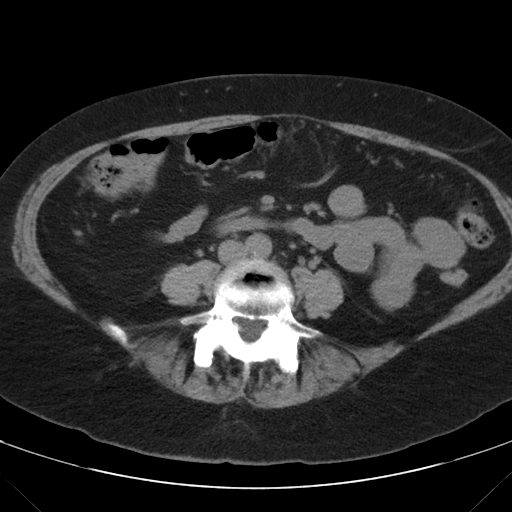
[im 52/86  soft-tissue]
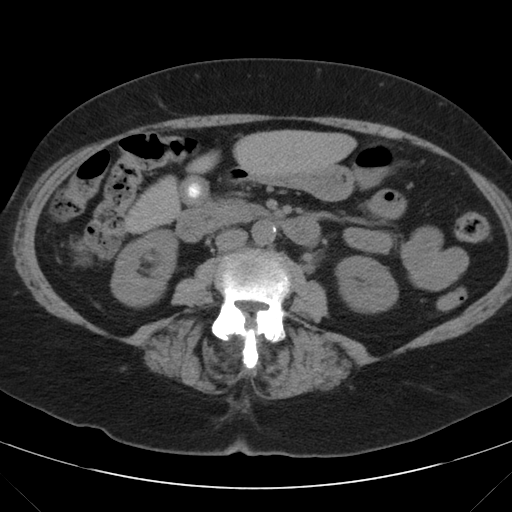
[im 60/86  soft-tissue]
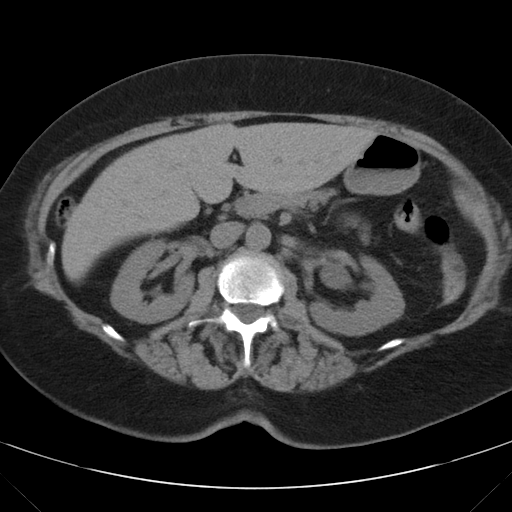
[im 71/86  soft-tissue]
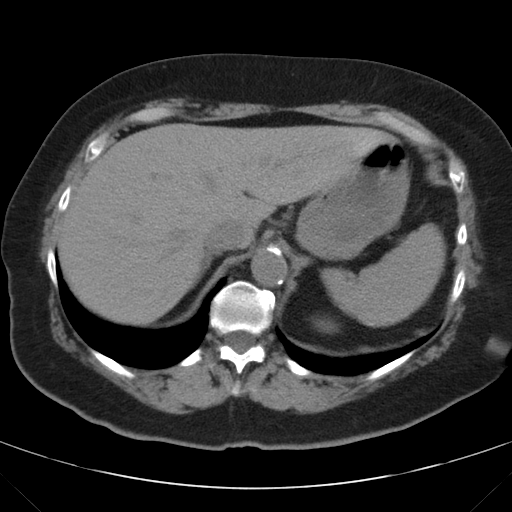
[im 78/86  soft-tissue]
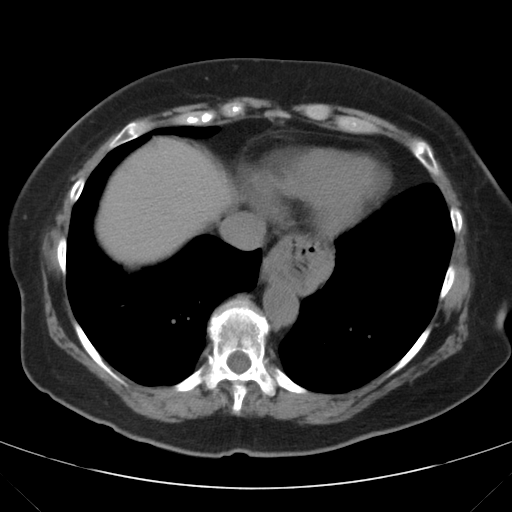
[im 78/86  bone]
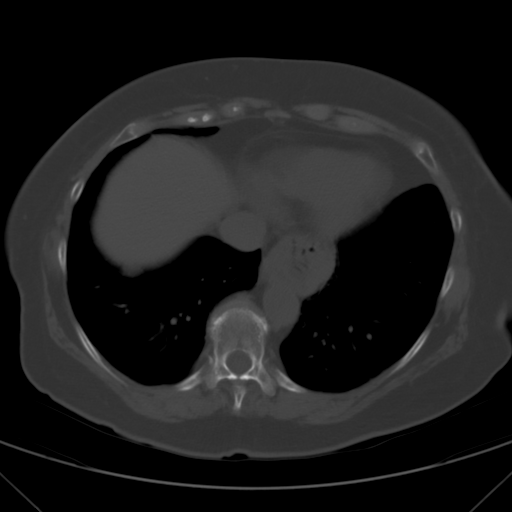

[Series 203: coronals, idose (2) · coronal · 0.45mm/px · 3 of 121 slices shown]
[im 41/121  soft-tissue]
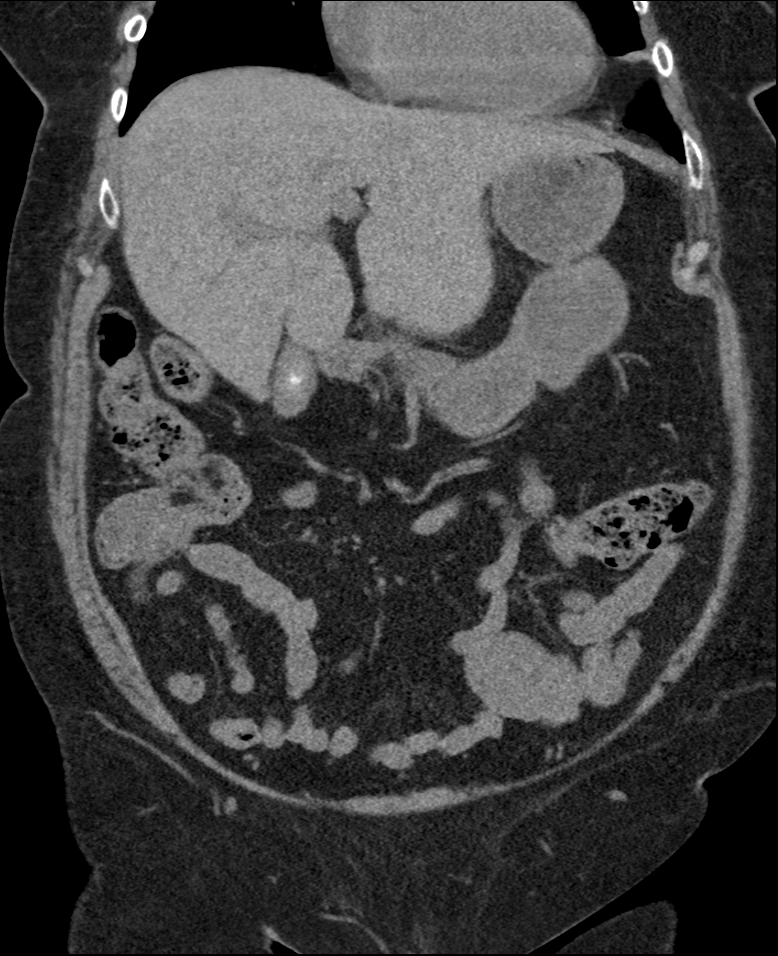
[im 54/121  soft-tissue]
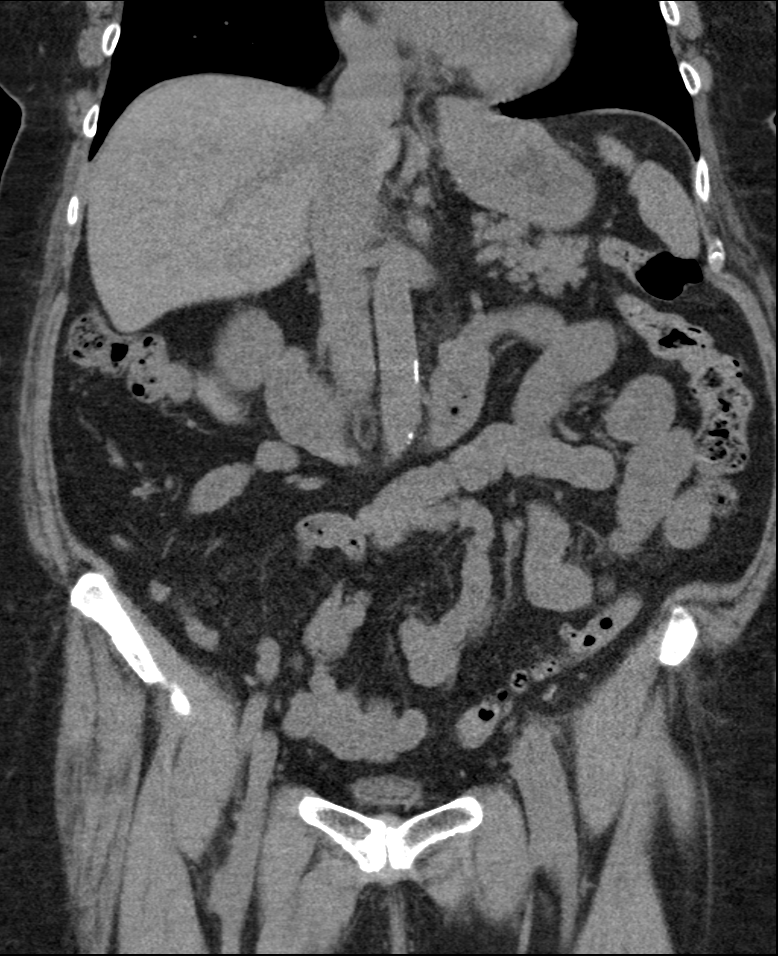
[im 67/121  soft-tissue]
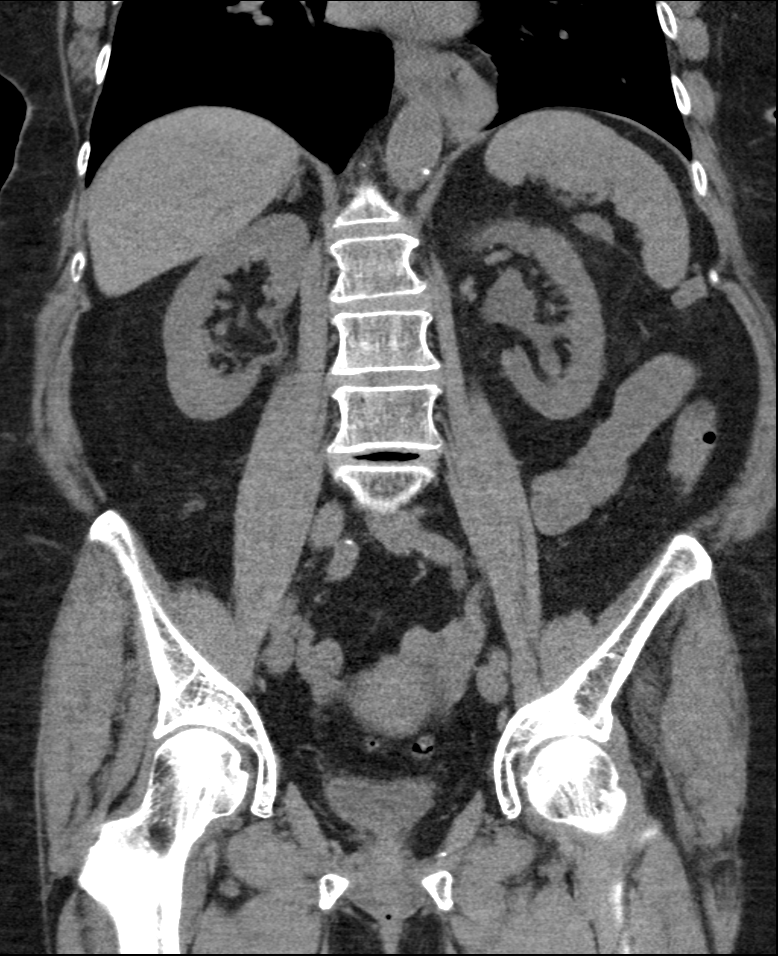

[12 of 46 positions shown; findings below may reference images not displayed]

FINDINGS: There is a 3 x 5 mm left ureteral calculus just beyond the iliac
vascular crossing, with moderate hydroureter and hydronephrosis.
There is a 2 mm lower pole left collecting system calculus. No other
urinary calculi are evident. No other acute findings are evident.

There are unremarkable unenhanced appearances of the liver and bile
ducts. There is a 9 mm stone within the contracted gallbladder.
There are unremarkable unenhanced appearances of the spleen,
pancreas, adrenals and right kidney. The abdominal aorta is normal
in caliber. There is mild atherosclerotic calcification. There is no
adenopathy in the abdomen or pelvis. There is a large hiatal hernia.
Bowel is otherwise unremarkable except for extensive colonic
diverticulosis. The uterus and ovaries are unremarkable.

There is no significant abnormality in the lower chest. There is no
significant skeletal lesion. There is moderately severe degenerative
lumbar disc and facet disease from L4 through S1.
IMPRESSION: 1. Obstructing 3 x 5 mm left ureteral calculus just below the iliac
vasculature with moderate hydronephrosis.
2. Lower pole left nephrolithiasis.
3. Diverticulosis.
4. Hiatal hernia.
5. Cholelithiasis.

## 2016-04-14 ENCOUNTER — Ambulatory Visit (HOSPITAL_BASED_OUTPATIENT_CLINIC_OR_DEPARTMENT_OTHER): Payer: Medicare Other | Attending: Cardiology | Admitting: Internal Medicine

## 2016-04-14 VITALS — Ht 65.0 in | Wt 185.0 lb

## 2016-04-14 DIAGNOSIS — G4733 Obstructive sleep apnea (adult) (pediatric): Secondary | ICD-10-CM | POA: Insufficient documentation

## 2016-04-14 DIAGNOSIS — G4761 Periodic limb movement disorder: Secondary | ICD-10-CM | POA: Insufficient documentation

## 2016-04-14 DIAGNOSIS — G4730 Sleep apnea, unspecified: Secondary | ICD-10-CM

## 2016-04-14 DIAGNOSIS — R0683 Snoring: Secondary | ICD-10-CM | POA: Diagnosis not present

## 2016-04-20 DIAGNOSIS — R0683 Snoring: Secondary | ICD-10-CM | POA: Diagnosis not present

## 2016-04-20 DIAGNOSIS — G4730 Sleep apnea, unspecified: Secondary | ICD-10-CM | POA: Diagnosis not present

## 2016-04-20 NOTE — Procedures (Signed)
   Patient Name: Melinda Lucas, Melinda Lucas Date: 04/14/2016 Gender: Female D.O.B: [birth year] Age (years): 74 Referring Provider: Landry Corporal Height (inches): 65 Interpreting Physician: Baird Lyons MD, ABSM Weight (lbs): 185 RPSGT: Madelon Lips BMI: 31 MRN: [id number] Neck Size: 15.00 CLINICAL INFORMATION The patient is referred for a CPAP titration to treat sleep apnea.   Date of NPSG, Split Night or HST:  Diagnostic NPSG 09/30/15  AHI 12.9/ hr, desaturation to 82%, bosy weight 185 lbs  SLEEP STUDY TECHNIQUE As per the AASM Manual for the Scoring of Sleep and Associated Events v2.3 (April 2016) with a hypopnea requiring 4% desaturations. The channels recorded and monitored were frontal, central and occipital EEG, electrooculogram (EOG), submentalis EMG (chin), nasal and oral airflow, thoracic and abdominal wall motion, anterior tibialis EMG, snore microphone, electrocardiogram, and pulse oximetry. Continuous positive airway pressure (CPAP) was initiated at the beginning of the study and titrated to treat sleep-disordered breathing.  MEDICATIONS Medications taken by the patient : charted for review Medications administered by patient during sleep study : No sleep medicine administered.  TECHNICIAN COMMENTS Comments added by technician: BATHROOM BREAK X1  Comments added by scorer: N/A  RESPIRATORY PARAMETERS Optimal PAP Pressure (cm): 7 AHI at Optimal Pressure (/hr): 0.0 Overall Minimal O2 (%): 91.00 Supine % at Optimal Pressure (%): 0 Minimal O2 at Optimal Pressure (%): 93.0    SLEEP ARCHITECTURE The study was initiated at 10:51:38 PM and ended at 5:07:08 AM. Sleep onset time was 53.8 minutes and the sleep efficiency was 61.3%. The total sleep time was 230.0 minutes. The patient spent 11.30% of the night in stage N1 sleep, 73.91% in stage N2 sleep, 2.83% in stage N3 and 11.96% in REM.Stage REM latency was 156.5 minutes Wake after sleep onset was 91.7. Alpha intrusion was  absent. Supine sleep was 0.00%.  CARDIAC DATA The 2 lead EKG demonstrated sinus rhythm. The mean heart rate was 71.29 beats per minute. Other EKG findings include: None.  LEG MOVEMENT DATA The total Periodic Limb Movements of Sleep (PLMS) were 324. The PLMS index was 84.52. A PLMS index of <15 is considered normal in adults.  IMPRESSIONS - The optimal PAP pressure was 7 cm of water. - Central sleep apnea was not noted during this titration (CAI = 0.0/h). - Significant oxygen desaturations were not observed during this titration (min O2 = 91.00%). - The patient snored with Soft snoring volume during this titration study. - No cardiac abnormalities were observed during this study. - Severe periodic limb movements were observed during this study. Arousals associated with PLMs were significant ( 11.5/ hr), but may reflect stimulation from unfamiliar CPAP.  DIAGNOSIS - Obstructive Sleep Apnea (327.23 [G47.33 ICD-10]) - Periodic Limb Movement Syndrome (327.51 [G47.61 ICD-10])  RECOMMENDATIONS - Trial of CPAP therapy on 7 cm H2O with a Small size Fisher&Paykel Full Face Mask Simplus mask and heated humidification. - Avoid alcohol, sedatives and other CNS depressants that may worsen sleep apnea and disrupt normal sleep architecture. - Sleep hygiene should be reviewed to assess factors that may improve sleep quality. - Weight management and regular exercise should be initiated or continued. - If sleep is disturbed by ongoing limb movement after adjustment to CPAP, then a therapeutic trial, such as Requip or Mirapex, might be considered.  Deneise Lever Diplomate, American Board of Sleep Medicine  ELECTRONICALLY SIGNED ON:  04/20/2016, 9:59 AM [place] PH: [phone]   FX: [phone] [place]

## 2016-05-07 DIAGNOSIS — G4733 Obstructive sleep apnea (adult) (pediatric): Secondary | ICD-10-CM | POA: Diagnosis not present

## 2016-06-10 DIAGNOSIS — Z6830 Body mass index (BMI) 30.0-30.9, adult: Secondary | ICD-10-CM | POA: Diagnosis not present

## 2016-06-10 DIAGNOSIS — Z01419 Encounter for gynecological examination (general) (routine) without abnormal findings: Secondary | ICD-10-CM | POA: Diagnosis not present

## 2016-06-10 DIAGNOSIS — Z1231 Encounter for screening mammogram for malignant neoplasm of breast: Secondary | ICD-10-CM | POA: Diagnosis not present

## 2016-06-25 ENCOUNTER — Institutional Professional Consult (permissible substitution): Payer: Medicare Other | Admitting: Pulmonary Disease

## 2016-07-05 DIAGNOSIS — Z23 Encounter for immunization: Secondary | ICD-10-CM | POA: Diagnosis not present

## 2016-07-15 DIAGNOSIS — I480 Paroxysmal atrial fibrillation: Secondary | ICD-10-CM | POA: Diagnosis not present

## 2016-07-15 DIAGNOSIS — Z7901 Long term (current) use of anticoagulants: Secondary | ICD-10-CM | POA: Diagnosis not present

## 2016-07-15 DIAGNOSIS — G4733 Obstructive sleep apnea (adult) (pediatric): Secondary | ICD-10-CM | POA: Diagnosis not present

## 2016-07-15 DIAGNOSIS — E668 Other obesity: Secondary | ICD-10-CM | POA: Diagnosis not present

## 2016-07-22 DIAGNOSIS — G4733 Obstructive sleep apnea (adult) (pediatric): Secondary | ICD-10-CM | POA: Diagnosis not present

## 2016-08-11 ENCOUNTER — Ambulatory Visit: Payer: Medicare Other | Admitting: Internal Medicine

## 2016-08-14 ENCOUNTER — Ambulatory Visit (INDEPENDENT_AMBULATORY_CARE_PROVIDER_SITE_OTHER): Payer: Medicare Other | Admitting: Internal Medicine

## 2016-08-14 ENCOUNTER — Encounter: Payer: Self-pay | Admitting: Internal Medicine

## 2016-08-14 VITALS — BP 124/82 | HR 79 | Ht 65.0 in | Wt 191.4 lb

## 2016-08-14 DIAGNOSIS — E669 Obesity, unspecified: Secondary | ICD-10-CM | POA: Diagnosis not present

## 2016-08-14 DIAGNOSIS — I495 Sick sinus syndrome: Secondary | ICD-10-CM | POA: Diagnosis not present

## 2016-08-14 DIAGNOSIS — I480 Paroxysmal atrial fibrillation: Secondary | ICD-10-CM

## 2016-08-14 NOTE — Progress Notes (Signed)
Electrophysiology Office Note   Date:  08/14/2016   ID:  Melinda Lucas, DOB 17-Feb-1941, MRN [id number]  PCP:  Marton Redwood, MD  Cardiologist:  Dr. Wynonia Lawman Primary Electrophysiologist: Dr. Rayann Heman    Chief Complaint  Patient presents with  . Atrial Fibrillation     History of Present Illness: Melinda Lucas is a 75 y.o. female who presents today for electrophysiology evaluation.   No symptomatic pauses or afib since her last visit.  She is very pleased with her current state. Today, she denies symptoms of palpitations, chest pain, shortness of breath, orthopnea, PND, lower extremity edema, claudication, dizziness, presyncope, syncope, bleeding, or neurologic sequela. The patient is tolerating medications without difficulties and is otherwise without complaint today.    Past Medical History:  Diagnosis Date  . Arthritis    "a little in my fingers" (08/09/2015)  . Atrial fibrillation ([place])   . Cataract    REMOVED BILATERAL  . GERD (gastroesophageal reflux disease)   . Heart murmur   . Melanoma in situ of lower leg ([place])   . Obesity (BMI 30-39.9)   . Obstructive sleep apnea   . Osteopenia   . Sleep apnea    Past Surgical History:  Procedure Laterality Date  . CATARACT EXTRACTION W/ INTRAOCULAR LENS  IMPLANT, BILATERAL Bilateral ~ 2014  . COLONOSCOPY    . DILATION AND CURETTAGE OF UTERUS  1970's  . ELECTROPHYSIOLOGIC STUDY N/A 08/09/2015   Procedure: Atrial Fibrillation Ablation;  Surgeon: Thompson Grayer, MD;  Location: [place] [facility];  Service: Cardiovascular;  Laterality: N/A;  . FRACTURE SURGERY    . MELANOMA EXCISION Left 2010   leg  . TEE WITHOUT CARDIOVERSION N/A 08/09/2015   Procedure: TRANSESOPHAGEAL ECHOCARDIOGRAM (TEE);  Surgeon: Jerline Pain, MD;  Location: [facility];  Service: Cardiovascular;  Laterality: N/A;  . TUBAL LIGATION  1970's  . WRIST FRACTURE SURGERY Left 2011     Current Outpatient Prescriptions  Medication Sig Dispense Refill  .  atorvastatin (LIPITOR) 20 MG tablet Take 20 mg by mouth daily.    . Coenzyme Q10 (CO Q 10 PO) Take 1 tablet by mouth once a week.     Marland Kitchen ELIQUIS 5 MG TABS tablet Take 5 mg by mouth 2 (two) times daily.  12  . TURMERIC PO Take 1 capsule by mouth once a week.     . Vitamin D, Ergocalciferol, (DRISDOL) 50000 UNITS CAPS Take 50,000 Units by mouth once a week.      No current facility-administered medications for this visit.     Allergies:   Review of patient's allergies indicates no known allergies.   Social History:  The patient  reports that she has quit smoking. Her smoking use included Cigarettes. She has never used smokeless tobacco. She reports that she drinks about 8.4 oz of alcohol per week . She reports that she does not use drugs.   Family History:  The patient's  family history includes AAA (abdominal aortic aneurysm) in her father; Colon cancer (age of onset: 67) in her mother; Hypertension in her brother.    ROS:  Please see the history of present illness.   All other systems are reviewed and negative.    PHYSICAL EXAM: VS:  BP 124/82   Pulse 79   Ht 5\' 5"  (1.651 m)   Wt 191 lb 6.4 oz (86.8 kg)   BMI 31.85 kg/m  , BMI Body mass index is 31.85 kg/m. GEN: Well nourished, well developed, in no acute  distress  HEENT: normal  Neck: no JVD, carotid bruits, or masses Cardiac: RRR; no significant murmurs, rubs, or gallops,no edema  Respiratory:  clear to auscultation bilaterally, normal work of breathing GI: soft, nontender, nondistended, + BS MS: no deformity or atrophy  Skin: warm and dry  Neuro:  Strength and sensation are intact Psych: euthymic mood, full affect  EKG:  EKG is ordered today. The ekg ordered today shows SR   Recent Labs: 01/06/2016: ALT 25; BUN 10; Creatinine, Ser 0.74; Hemoglobin 13.5; Platelets 267; Potassium 3.8; Sodium 144    Lipid Panel  No results found for: CHOL, TRIG, HDL, CHOLHDL, VLDL, LDLCALC, LDLDIRECT   Wt Readings from Last 3  Encounters:  08/14/16 191 lb 6.4 oz (86.8 kg)  04/14/16 185 lb (83.9 kg)  02/11/16 185 lb 9.6 oz (84.2 kg)      ASSESSMENT AND PLAN:  1.  PAFib  CHADS2Vasc is 3 on Eliquis  Maintaining sinus rhythm off of AAD therapy  2. Sick sinus syndrome Post termination pauses resolved post ablation No indication for pacing at this time Resume normal activity  3. Obesity Body mass index is 31.85 kg/m. Regular exercise and weight loss advised  4. OSA Followed by Dr Annamaria Boots (recent note reviewed)  Current medicines are reviewed at length with the patient today.   The patient does not have concerns regarding her medicines.  The following changes were made today:  None  Return to see me in 6 months Follow-up with Dr Wynonia Lawman as scheduled   Signed, Thompson Grayer MD 08/14/2016 3:35 PM     [place] [address] [address] [phone] (office) [phone] (fax)

## 2016-08-14 NOTE — Patient Instructions (Signed)

## 2016-09-23 DIAGNOSIS — L570 Actinic keratosis: Secondary | ICD-10-CM | POA: Diagnosis not present

## 2016-09-23 DIAGNOSIS — L814 Other melanin hyperpigmentation: Secondary | ICD-10-CM | POA: Diagnosis not present

## 2016-09-23 DIAGNOSIS — L720 Epidermal cyst: Secondary | ICD-10-CM | POA: Diagnosis not present

## 2016-09-23 DIAGNOSIS — L821 Other seborrheic keratosis: Secondary | ICD-10-CM | POA: Diagnosis not present

## 2016-09-23 DIAGNOSIS — D692 Other nonthrombocytopenic purpura: Secondary | ICD-10-CM | POA: Diagnosis not present

## 2016-09-23 DIAGNOSIS — Z85820 Personal history of malignant melanoma of skin: Secondary | ICD-10-CM | POA: Diagnosis not present

## 2016-09-23 DIAGNOSIS — Z85828 Personal history of other malignant neoplasm of skin: Secondary | ICD-10-CM | POA: Diagnosis not present

## 2016-12-16 ENCOUNTER — Encounter (HOSPITAL_COMMUNITY): Payer: Self-pay

## 2016-12-16 ENCOUNTER — Emergency Department (HOSPITAL_COMMUNITY): Payer: Medicare Other

## 2016-12-16 ENCOUNTER — Telehealth: Payer: Self-pay | Admitting: Internal Medicine

## 2016-12-16 ENCOUNTER — Inpatient Hospital Stay (HOSPITAL_COMMUNITY)
Admission: EM | Admit: 2016-12-16 | Discharge: 2016-12-18 | DRG: 419 | Disposition: A | Discharge status: Home / Self Care | Payer: Medicare Other | Attending: Internal Medicine | Admitting: Internal Medicine

## 2016-12-16 ENCOUNTER — Encounter: Payer: Self-pay | Admitting: Internal Medicine

## 2016-12-16 DIAGNOSIS — Z961 Presence of intraocular lens: Secondary | ICD-10-CM | POA: Diagnosis present

## 2016-12-16 DIAGNOSIS — Z87891 Personal history of nicotine dependence: Secondary | ICD-10-CM | POA: Diagnosis not present

## 2016-12-16 DIAGNOSIS — E784 Other hyperlipidemia: Secondary | ICD-10-CM | POA: Diagnosis not present

## 2016-12-16 DIAGNOSIS — Z85820 Personal history of malignant melanoma of skin: Secondary | ICD-10-CM | POA: Diagnosis not present

## 2016-12-16 DIAGNOSIS — R1033 Periumbilical pain: Secondary | ICD-10-CM | POA: Diagnosis not present

## 2016-12-16 DIAGNOSIS — Z9842 Cataract extraction status, left eye: Secondary | ICD-10-CM | POA: Diagnosis not present

## 2016-12-16 DIAGNOSIS — I10 Essential (primary) hypertension: Secondary | ICD-10-CM | POA: Diagnosis not present

## 2016-12-16 DIAGNOSIS — M8580 Other specified disorders of bone density and structure, unspecified site: Secondary | ICD-10-CM | POA: Diagnosis present

## 2016-12-16 DIAGNOSIS — K8012 Calculus of gallbladder with acute and chronic cholecystitis without obstruction: Secondary | ICD-10-CM | POA: Diagnosis not present

## 2016-12-16 DIAGNOSIS — K8000 Calculus of gallbladder with acute cholecystitis without obstruction: Secondary | ICD-10-CM | POA: Diagnosis not present

## 2016-12-16 DIAGNOSIS — M19042 Primary osteoarthritis, left hand: Secondary | ICD-10-CM | POA: Diagnosis present

## 2016-12-16 DIAGNOSIS — K660 Peritoneal adhesions (postprocedural) (postinfection): Secondary | ICD-10-CM | POA: Diagnosis present

## 2016-12-16 DIAGNOSIS — Z800 Family history of malignant neoplasm of digestive organs: Secondary | ICD-10-CM

## 2016-12-16 DIAGNOSIS — I700 Atherosclerosis of aorta: Secondary | ICD-10-CM | POA: Diagnosis present

## 2016-12-16 DIAGNOSIS — M19041 Primary osteoarthritis, right hand: Secondary | ICD-10-CM | POA: Diagnosis present

## 2016-12-16 DIAGNOSIS — I480 Paroxysmal atrial fibrillation: Secondary | ICD-10-CM | POA: Diagnosis present

## 2016-12-16 DIAGNOSIS — E669 Obesity, unspecified: Secondary | ICD-10-CM | POA: Diagnosis present

## 2016-12-16 DIAGNOSIS — G4733 Obstructive sleep apnea (adult) (pediatric): Secondary | ICD-10-CM | POA: Diagnosis present

## 2016-12-16 DIAGNOSIS — K810 Acute cholecystitis: Secondary | ICD-10-CM | POA: Diagnosis not present

## 2016-12-16 DIAGNOSIS — Z7901 Long term (current) use of anticoagulants: Secondary | ICD-10-CM | POA: Diagnosis not present

## 2016-12-16 DIAGNOSIS — Z01810 Encounter for preprocedural cardiovascular examination: Secondary | ICD-10-CM | POA: Diagnosis not present

## 2016-12-16 DIAGNOSIS — R079 Chest pain, unspecified: Secondary | ICD-10-CM | POA: Diagnosis not present

## 2016-12-16 DIAGNOSIS — K8020 Calculus of gallbladder without cholecystitis without obstruction: Secondary | ICD-10-CM | POA: Diagnosis not present

## 2016-12-16 DIAGNOSIS — Z9841 Cataract extraction status, right eye: Secondary | ICD-10-CM

## 2016-12-16 DIAGNOSIS — Z8249 Family history of ischemic heart disease and other diseases of the circulatory system: Secondary | ICD-10-CM

## 2016-12-16 DIAGNOSIS — K219 Gastro-esophageal reflux disease without esophagitis: Secondary | ICD-10-CM | POA: Diagnosis present

## 2016-12-16 DIAGNOSIS — K8066 Calculus of gallbladder and bile duct with acute and chronic cholecystitis without obstruction: Secondary | ICD-10-CM | POA: Diagnosis not present

## 2016-12-16 DIAGNOSIS — R112 Nausea with vomiting, unspecified: Secondary | ICD-10-CM | POA: Diagnosis not present

## 2016-12-16 DIAGNOSIS — R1011 Right upper quadrant pain: Secondary | ICD-10-CM

## 2016-12-16 DIAGNOSIS — E785 Hyperlipidemia, unspecified: Secondary | ICD-10-CM | POA: Diagnosis present

## 2016-12-16 DIAGNOSIS — Z6830 Body mass index (BMI) 30.0-30.9, adult: Secondary | ICD-10-CM

## 2016-12-16 DIAGNOSIS — K5730 Diverticulosis of large intestine without perforation or abscess without bleeding: Secondary | ICD-10-CM | POA: Diagnosis not present

## 2016-12-16 HISTORY — DX: Personal history of urinary calculi: Z87.442

## 2016-12-16 HISTORY — DX: Dependence on other enabling machines and devices: Z99.89

## 2016-12-16 HISTORY — DX: Obstructive sleep apnea (adult) (pediatric): G47.33

## 2016-12-16 LAB — COMPREHENSIVE METABOLIC PANEL
ALT: 16 U/L (ref 14–54)
AST: 25 U/L (ref 15–41)
Albumin: 4.2 g/dL (ref 3.5–5.0)
Alkaline Phosphatase: 102 U/L (ref 38–126)
Anion gap: 12 (ref 5–15)
BUN: 11 mg/dL (ref 6–20)
CHLORIDE: 106 mmol/L (ref 101–111)
CO2: 23 mmol/L (ref 22–32)
CREATININE: 0.83 mg/dL (ref 0.44–1.00)
Calcium: 9.5 mg/dL (ref 8.9–10.3)
Glucose, Bld: 150 mg/dL — ABNORMAL HIGH (ref 65–99)
Potassium: 3.7 mmol/L (ref 3.5–5.1)
Sodium: 141 mmol/L (ref 135–145)
Total Bilirubin: 0.8 mg/dL (ref 0.3–1.2)
Total Protein: 7.1 g/dL (ref 6.5–8.1)

## 2016-12-16 LAB — CBC
HCT: 44.9 % (ref 36.0–46.0)
Hemoglobin: 14.8 g/dL (ref 12.0–15.0)
MCH: 31.4 pg (ref 26.0–34.0)
MCHC: 33 g/dL (ref 30.0–36.0)
MCV: 95.3 fL (ref 78.0–100.0)
PLATELETS: 256 10*3/uL (ref 150–400)
RBC: 4.71 MIL/uL (ref 3.87–5.11)
RDW: 13.2 % (ref 11.5–15.5)
WBC: 9.2 10*3/uL (ref 4.0–10.5)

## 2016-12-16 LAB — SURGICAL PCR SCREEN
MRSA, PCR: NEGATIVE
STAPHYLOCOCCUS AUREUS: POSITIVE — AB

## 2016-12-16 LAB — URINALYSIS, ROUTINE W REFLEX MICROSCOPIC
Bacteria, UA: NONE SEEN
Bilirubin Urine: NEGATIVE
GLUCOSE, UA: NEGATIVE mg/dL
Ketones, ur: NEGATIVE mg/dL
Nitrite: NEGATIVE
PROTEIN: NEGATIVE mg/dL
RBC / HPF: NONE SEEN RBC/hpf (ref 0–5)
SPECIFIC GRAVITY, URINE: 1.024 (ref 1.005–1.030)
WBC, UA: NONE SEEN WBC/hpf (ref 0–5)
pH: 5 (ref 5.0–8.0)

## 2016-12-16 LAB — BRAIN NATRIURETIC PEPTIDE: B Natriuretic Peptide: 83.5 pg/mL (ref 0.0–100.0)

## 2016-12-16 LAB — I-STAT TROPONIN, ED
TROPONIN I, POC: 0 ng/mL (ref 0.00–0.08)
TROPONIN I, POC: 0 ng/mL (ref 0.00–0.08)

## 2016-12-16 LAB — TROPONIN I
Troponin I: <0.03 ng/mL (ref <0.03)
Troponin I: <0.03 ng/mL (ref <0.03)

## 2016-12-16 LAB — PROTIME-INR
INR: 1.16
Prothrombin Time: 14.9 seconds (ref 11.4–15.2)

## 2016-12-16 LAB — LIPASE, BLOOD: LIPASE: 25 U/L (ref 11–51)

## 2016-12-16 LAB — APTT: APTT: 29 s (ref 24–36)

## 2016-12-16 MED ORDER — HYDRALAZINE HCL 20 MG/ML IJ SOLN
5.0000 mg | INTRAMUSCULAR | Status: DC | PRN
Start: 2016-12-16 — End: 2016-12-18

## 2016-12-16 MED ORDER — ONDANSETRON HCL 4 MG/2ML IJ SOLN: 4.0000 mg | Freq: Once | INTRAMUSCULAR | Status: DC

## 2016-12-16 MED ORDER — ONDANSETRON HCL 4 MG/2ML IJ SOLN
4.0000 mg | Freq: Once | INTRAMUSCULAR | Status: AC
  Administered 2016-12-16: 4 mg via INTRAVENOUS
  Filled 2016-12-16: qty 2

## 2016-12-16 MED ORDER — HYDROMORPHONE HCL 2 MG/ML IJ SOLN
0.5000 mg | Freq: Once | INTRAMUSCULAR | Status: AC
Start: 2016-12-16 — End: 2016-12-16
  Administered 2016-12-16: 0.5 mg via INTRAVENOUS
  Filled 2016-12-16: qty 1

## 2016-12-16 MED ORDER — DEXTROSE 5 % IV SOLN
2.0000 g | Freq: Once | INTRAVENOUS | Status: AC
  Administered 2016-12-16: 2 g via INTRAVENOUS
  Filled 2016-12-16: qty 2

## 2016-12-16 MED ORDER — HYDROMORPHONE HCL 2 MG/ML IJ SOLN
0.5000 mg | Freq: Once | INTRAMUSCULAR | Status: AC
  Administered 2016-12-16: 0.5 mg via INTRAVENOUS
  Filled 2016-12-16: qty 1

## 2016-12-16 MED ORDER — HYDROMORPHONE HCL 2 MG/ML IJ SOLN: 0.5000 mg | INTRAMUSCULAR | Status: DC | PRN

## 2016-12-16 MED ORDER — SODIUM CHLORIDE 0.45 % IV SOLN
INTRAVENOUS | Status: DC
  Administered 2016-12-16 – 2016-12-17 (×2): via INTRAVENOUS

## 2016-12-16 MED ORDER — DEXTROSE 5 % IV SOLN
2.0000 g | INTRAVENOUS | Status: DC
  Filled 2016-12-16: qty 2

## 2016-12-16 MED ORDER — ATORVASTATIN CALCIUM 20 MG PO TABS
20.0000 mg | ORAL_TABLET | Freq: Every day | ORAL | Status: DC
  Administered 2016-12-16 – 2016-12-17 (×2): 20 mg via ORAL
  Filled 2016-12-16 (×2): qty 1

## 2016-12-16 NOTE — ED Notes (Signed)
Patient transported to CT 

## 2016-12-16 NOTE — H&P (Signed)
History and Physical    BROOK ZEINER [id number] DOB: 1941-06-24 DOA: 12/16/2016  PCP: Marton Redwood, MD Patient coming from: home  Chief Complaint: abd pain  HPI: SOLSTICE TSAY is a 76 y.o. female with medical history significant of Afib, GERD, Melanoma, Obesity, OSA, osteopenia, presenting w/ Abdominal pain. Woke up with nausea only at 01:00 developed abdominal pain starting around 02:00. Came to ED at 03:00. Abdominal pain was initially described as a nausea feeling in the lower abdomen across the right and left sides with mild crampy pain. Associated with a 2 out of 10 right achy chest pain. Symptoms were only occasionally worsened with certain movements but no change with exertion. Associated with mild feeling of shortness of breath during periods of extreme pain and sensation of cold extremities. No oral intake since onset of symptoms. Has not taken any medicines for her symptoms. Emesis 2 in the ED. Denies palpitations, diarrhea, fevers, dysuria, frequency, back pain, neck stiffness, headache, LOC, vertigo, focal neurological deficit.    Patient reports that in the ED symptoms resolved with Dilaudid and Zofran but returned after having her ultrasound performed when she sat up from the bed. Currently pain-free.    ED Course: Narcotics, ceftriaxone, Zofran and fluids given in the ED. Objective findings outlined below.  Review of Systems: As per HPI otherwise 10 point review of systems negative.   Ambulatory Status: no restrictions  Past Medical History:  Diagnosis Date  . Arthritis    "a little in my fingers" (08/09/2015)  . Atrial fibrillation ([place])   . Cataract    REMOVED BILATERAL  . GERD (gastroesophageal reflux disease)   . Heart murmur   . Melanoma in situ of lower leg ([place])   . Obesity (BMI 30-39.9)   . Obstructive sleep apnea   . Osteopenia   . Sleep apnea     Past Surgical History:  Procedure Laterality Date  . CATARACT EXTRACTION W/ INTRAOCULAR LENS   IMPLANT, BILATERAL Bilateral ~ 2014  . COLONOSCOPY    . DILATION AND CURETTAGE OF UTERUS  1970's  . ELECTROPHYSIOLOGIC STUDY N/A 08/09/2015   Procedure: Atrial Fibrillation Ablation;  Surgeon: Thompson Grayer, MD;  Location: [facility];  Service: Cardiovascular;  Laterality: N/A;  . FRACTURE SURGERY    . MELANOMA EXCISION Left 2010   leg  . TEE WITHOUT CARDIOVERSION N/A 08/09/2015   Procedure: TRANSESOPHAGEAL ECHOCARDIOGRAM (TEE);  Surgeon: Jerline Pain, MD;  Location: [facility] ENDOSCOPY;  Service: Cardiovascular;  Laterality: N/A;  . TUBAL LIGATION  1970's  . WRIST FRACTURE SURGERY Left 2011    Social History   Social History  . Marital status: Married    Spouse name: N/A  . Number of children: N/A  . Years of education: N/A   Occupational History  . Not on file.   Social History Main Topics  . Smoking status: Former Smoker    Types: Cigarettes  . Smokeless tobacco: Never Used     Comment: "quit smoking in ~ 1970"  . Alcohol use 8.4 oz/week    7 Shots of liquor, 7 Standard drinks or equivalent per week     Comment: 1 drink daily wine or liquor per pt  . Drug use: No  . Sexual activity: Not Currently   Other Topics Concern  . 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 Colon cancer  Mother 77  . AAA (abdominal aortic aneurysm) Father   . Hypertension Brother     Prior to Admission medications   Medication Sig Start Date End Date Taking? Authorizing Provider  atorvastatin (LIPITOR) 20 MG tablet Take 20 mg by mouth daily. 07/07/15  Yes Historical Provider, MD  ELIQUIS 5 MG TABS tablet Take 5 mg by mouth 2 (two) times daily. 06/24/15  Yes Historical Provider, MD  PRESCRIPTION MEDICATION Apply 1 application topically daily. Cream for face, filled at [facility]   Yes Historical Provider, MD  TURMERIC PO Take 1 capsule by mouth daily.    Yes Historical Provider, MD    Vitamin D, Ergocalciferol, (DRISDOL) 50000 UNITS CAPS Take 50,000 Units by mouth every Monday.    Yes Historical Provider, MD    Physical Exam: Vitals:   12/16/16 0715 12/16/16 0730 12/16/16 0900 12/16/16 1000  BP: 132/67 133/56 135/58 122/92  Pulse: 86 85 88 94  Resp: 19 12 17 16   Temp:      TempSrc:      SpO2: 97% 97% 97% 97%  Weight:      Height:         General:  Appears calm and comfortable Eyes:  PERRL, EOMI, normal lids, iris ENT:  grossly normal hearing, lips & tongue, mmm Neck:  no LAD, masses or thyromegaly Cardiovascular:  RRR, no m/r/g. No LE edema.  Respiratory:  CTA bilaterally, no w/r/r. Normal respiratory effort. Abdomen:  soft, ntnd, NABS Skin:  no rash or induration seen on limited exam Musculoskeletal:  grossly normal tone BUE/BLE, good ROM, no bony abnormality Psychiatric:  grossly normal mood and affect, speech fluent and appropriate, AOx3 Neurologic:  CN 2-12 grossly intact, moves all extremities in coordinated fashion, sensation intact  Labs on Admission: I have personally reviewed following labs and imaging studies  CBC:  Recent Labs Lab 12/16/16 0410  WBC 9.2  HGB 14.8  HCT 44.9  MCV 95.3  PLT 123456   Basic Metabolic Panel:  Recent Labs Lab 12/16/16 0410  NA 141  K 3.7  CL 106  CO2 23  GLUCOSE 150*  BUN 11  CREATININE 0.83  CALCIUM 9.5   GFR: Estimated Creatinine Clearance: 60.8 mL/min (by C-G formula based on SCr of 0.83 mg/dL). Liver Function Tests:  Recent Labs Lab 12/16/16 0410  AST 25  ALT 16  ALKPHOS 102  BILITOT 0.8  PROT 7.1  ALBUMIN 4.2    Recent Labs Lab 12/16/16 0410  LIPASE 25   No results for input(s): AMMONIA in the last 168 hours. Coagulation Profile: No results for input(s): INR, PROTIME in the last 168 hours. Cardiac Enzymes: No results for input(s): CKTOTAL, CKMB, CKMBINDEX, TROPONINI in the last 168 hours. BNP (last 3 results) No results for input(s): PROBNP in the last 8760  hours. HbA1C: No results for input(s): HGBA1C in the last 72 hours. CBG: No results for input(s): GLUCAP in the last 168 hours. Lipid Profile: No results for input(s): CHOL, HDL, LDLCALC, TRIG, CHOLHDL, LDLDIRECT in the last 72 hours. Thyroid Function Tests: No results for input(s): TSH, T4TOTAL, FREET4, T3FREE, THYROIDAB in the last 72 hours. Anemia Panel: No results for input(s): VITAMINB12, FOLATE, FERRITIN, TIBC, IRON, RETICCTPCT in the last 72 hours. Urine analysis:    Component Value Date/Time   COLORURINE YELLOW 12/16/2016 0457   APPEARANCEUR HAZY (A) 12/16/2016 0457   LABSPEC 1.024 12/16/2016 0457   PHURINE 5.0 12/16/2016 0457   GLUCOSEU NEGATIVE 12/16/2016 0457   HGBUR SMALL (A) 12/16/2016 0457  BILIRUBINUR NEGATIVE 12/16/2016 [place] 12/16/2016 0457   PROTEINUR NEGATIVE 12/16/2016 0457   NITRITE NEGATIVE 12/16/2016 0457   LEUKOCYTESUR SMALL (A) 12/16/2016 0457    Creatinine Clearance: Estimated Creatinine Clearance: 60.8 mL/min (by C-G formula based on SCr of 0.83 mg/dL).  Sepsis Labs: @LABRCNTIP (procalcitonin:4,lacticidven:4) )No results found for this or any previous visit (from the past 240 hour(s)).   Radiological Exams on Admission: Dg Chest 2 View  Result Date: 12/16/2016 CLINICAL DATA:  Chest pain and abdominal pain EXAM: CHEST  2 VIEW COMPARISON:  07/14/2015 FINDINGS: Cardiac shadow is stable. Aortic calcifications are again seen. Lungs are well aerated bilaterally with mild stable interstitial changes. No focal infiltrate or sizable effusion is noted. No acute bony abnormality is seen. IMPRESSION: No active cardiopulmonary disease. Electronically Signed   By: Inez Catalina M.D.   On: 12/16/2016 07:57   Ct Renal Stone Study  Result Date: 12/16/2016 CLINICAL DATA:  76 year old female with right-sided chest and back pain with nausea and vomiting since this morning. History kidney stones. Initial encounter. EXAM: CT ABDOMEN AND PELVIS WITHOUT  CONTRAST TECHNIQUE: Multidetector CT imaging of the abdomen and pelvis was performed following the standard protocol without IV contrast. COMPARISON:  01/06/2016 CT. FINDINGS: Lower chest: Tiny calcified granuloma medial left lung base. Mild scarring/ atelectasis lung bases. Heart size within normal limits. Mitral valve calcifications. Hepatobiliary: Taking into account limitation by non contrast imaging, no focal worrisome hepatic lesion. Prominent size gallbladder containing 2 cm gallstone without CT evidence of inflammation however if this is of concern, ultrasound recommended. Pancreas: Taking into account limitation by non contrast imaging, no mass or inflammation. Spleen: Taking into account limitation by non contrast imaging, no mass or enlargement. Adrenals/Urinary Tract: No renal or ureteral obstructing stone or hydronephrosis. Taking into account limitation by non contrast imaging, no renal or adrenal mass. Urinary bladder contents appear dense. Significance/etiology indeterminate. Stomach/Bowel: Diverticula and muscular hypertrophy most notable sigmoid colon consistent with diverticulosis without CT evidence of diverticulitis. Portions of colon are under distended and evaluation limited although no extraluminal bowel inflammatory process, free fluid or free air noted. No inflammation surrounds the appendix. Moderate-size hiatal hernia. Vascular/Lymphatic: Aortic and iliac artery calcifications without aneurysmal dilation. No adenopathy. Reproductive: Pedunculated 2.4 cm fibroid left fundal region suspected. Left ovary top-normal in size. Other: No bowel containing hernia. Musculoskeletal: Degenerative changes lumbar spine most notable L3-4 through L5-S1. IMPRESSION: No renal or ureteral obstructing stone or hydronephrosis. Prominent size gallbladder containing 2 cm gallstone without CT evidence of inflammation however if this is of concern, ultrasound recommended. Sigmoid diverticulosis. Portions of colon  are under distended and evaluation limited although no extraluminal bowel inflammatory process, free fluid or free air noted. No inflammation surrounds the appendix. Moderate-size hiatal hernia. Urinary bladder contents appear dense. Significance/etiology indeterminate. 2.4 cm left fundal fibroid suspected. Top-normal size of left ovary. Degenerative changes lower lumbar spine. Electronically Signed   By: Genia Del M.D.   On: 12/16/2016 06:31   US Abdomen Limited Ruq  Result Date: 12/16/2016 CLINICAL DATA:  Right upper quadrant pain. EXAM: US ABDOMEN LIMITED - RIGHT UPPER QUADRANT COMPARISON:  CT scan 12/16/2016. FINDINGS: Gallbladder: Gallbladder is distended. 9 mm mobile shadowing stone is identified in the lumen. The gallbladder wall appears thickened and is striated in some areas suggesting gallbladder wall edema. Gallbladder wall thickness measures up to 9 mm. The sonographer reports no sonographic Murphy sign, but patient has apparently received pain medication prior to this study which makes assessment unreliable. Common  bile duct: Diameter: 6 mm Liver: No focal abnormality. IMPRESSION: 1. Cholelithiasis with gallbladder wall thickening and apparent gallbladder wall edema. Ultrasound imaging features are highly suspicious for acute cholecystitis. If clinical exam is equivocal, nuclear scintigraphy may prove helpful to assess for cystic duct obstruction. Electronically Signed   By: Misty Stanley M.D.   On: 12/16/2016 08:36    EKG: Independently reviewed. Sinus. No ACS  Assessment/Plan Active Problems:   Paroxysmal atrial fibrillation (HCC)   Hyperlipidemia   Acute cholecystitis   OSA (obstructive sleep apnea)   Hypertension   Acute cholecystitis: CT and US findings as above. AFVSS. [facility] consulted and likely to take pt for Lap Chole on 12/17/16 - mgt per [facility] - Continue IVF and CTX - clear liquid diet and NPO after midnight - Coags, CXR, EKG - GUPTA perioperative risk 1.16%  PAF: h/o  Ablation on 07/2015. Last dose of Eliquis 21:00 on 12/15/16. EKG showing sinus w/o evidence of ACS. pts complaints of CP likely referred from cholecystitis. Last TEE showing EF 123456 w/ nml diastolic function.  BNP nml. Poc Trop nml. - Cycle trop - Tele for 24 hrs - EKG in am - Cardiology - Dr Oran Rein to see for pre-op clearance. - Hold Eliquis  HLD: - continue lipitor  HTN: intermittently elevated likely from pain. - hydralazine prn  OSA: - CPAP at night.   DVT prophylaxis: SCD - resume Eliquis per surgery postoperatively  Code Status: Full  Family Communication: none  Disposition Plan: Inpatient - anticipate surgery 12/17/16 w/ DC on 2/19  Consults called: [facility], Cardiology - Dr. Oran Rein  Admission status: inpt - Tele for 24hrs    Taya Ashbaugh J MD [facility]  If 7PM-7AM, please contact night-coverage [email or web address] Password [email or web address]  12/16/2016, 11:15 AM

## 2016-12-16 NOTE — ED Notes (Signed)
Pt transported to xray 

## 2016-12-16 NOTE — ED Notes (Signed)
Pt ambulated to restroom. Pt had no complaints.

## 2016-12-16 NOTE — ED Notes (Signed)
Pt ambulated to restroom, no issues. Pt placed back on monitor. 

## 2016-12-16 NOTE — Telephone Encounter (Signed)
The husband of Melinda Lucas contacted our after hours line to discuss symptoms of his wife.  Mrs. Shiflett reportedly had nausea and right sided chest pain for the past 3 hours.  Unclear what started her symptoms.  Do to persistent symptoms, they decided to come to the emergency department for further evaluation.  They were driving to the hospital when I contacted them back.  Liborio Nixon, MD

## 2016-12-16 NOTE — ED Notes (Signed)
Pt returned from CT °

## 2016-12-16 NOTE — ED Triage Notes (Signed)
Pt woke up at about 0100 this morning with nausea, bloating, sob, and right sided chest pain. Pt called cardiologist and decided to come here. Pt is actively vomiting during triage.

## 2016-12-16 NOTE — ED Provider Notes (Signed)
[facility] DEPT Provider Note   CSN: [id number] Arrival date & time: 12/16/16  0359     History   Chief Complaint Chief Complaint  Patient presents with  . Chest Pain  . Emesis  . Shortness of Breath    HPI Melinda Lucas is a 76 y.o. female.  Patient reports she woke up at around 1:00 am secondary to nausea. No vomiting, pain or fever. After onset of symptoms she experienced a brief mild right sided chest/shoulder discomfort. No SOB or diaphoresis. Later still, she developed pain across her abdomen. No back or flank pain. She reports she had kidney stones in the past x 1 episode one year ago with the same symptoms.    The history is provided by the patient. No language interpreter was used.    Past Medical History:  Diagnosis Date  . Arthritis    "a little in my fingers" (08/09/2015)  . Atrial fibrillation ([place])   . Cataract    REMOVED BILATERAL  . GERD (gastroesophageal reflux disease)   . Heart murmur   . Melanoma in situ of lower leg ([place])   . Obesity (BMI 30-39.9)   . Obstructive sleep apnea   . Osteopenia   . Sleep apnea     Patient Active Problem List   Diagnosis Date Noted  . Sick sinus syndrome ([place]) 08/06/2015  . Obesity (BMI 30-39.9) 07/14/2015  . Paroxysmal atrial fibrillation ([facility])   . Long-term (current) use of anticoagulants   . Aortic arch atherosclerosis ([place])   . Hyperlipidemia     Past Surgical History:  Procedure Laterality Date  . CATARACT EXTRACTION W/ INTRAOCULAR LENS  IMPLANT, BILATERAL Bilateral ~ 2014  . COLONOSCOPY    . DILATION AND CURETTAGE OF UTERUS  1970's  . ELECTROPHYSIOLOGIC STUDY N/A 08/09/2015   Procedure: Atrial Fibrillation Ablation;  Surgeon: Thompson Grayer, MD;  Location: [facility];  Service: Cardiovascular;  Laterality: N/A;  . FRACTURE SURGERY    . MELANOMA EXCISION Left 2010   leg  . TEE WITHOUT CARDIOVERSION N/A 08/09/2015   Procedure: TRANSESOPHAGEAL ECHOCARDIOGRAM (TEE);  Surgeon: Jerline Pain, MD;   [facility] ENDOSCOPY;  Service: Cardiovascular;  Laterality: N/A;  . TUBAL LIGATION  1970's  . WRIST FRACTURE SURGERY Left 2011    OB History    No data available       Home Medications    Prior to Admission medications   Medication Sig Start Date End Date Taking? Authorizing Provider  atorvastatin (LIPITOR) 20 MG tablet Take 20 mg by mouth daily. 07/07/15  Yes Historical Provider, MD  ELIQUIS 5 MG TABS tablet Take 5 mg by mouth 2 (two) times daily. 06/24/15  Yes Historical Provider, MD  PRESCRIPTION MEDICATION Apply 1 application topically daily. Cream for face, filled at [facility]   Yes Historical Provider, MD  TURMERIC PO Take 1 capsule by mouth daily.    Yes Historical Provider, MD  Vitamin D, Ergocalciferol, (DRISDOL) 50000 UNITS CAPS Take 50,000 Units by mouth every Monday.    Yes Historical Provider, MD    Family History Family History  Problem Relation Age of Onset  . Colon cancer Mother 90  . AAA (abdominal aortic aneurysm) Father   . Hypertension Brother     Social History Social History  Substance Use Topics  . Smoking status: Former Smoker    Types: Cigarettes  . Smokeless tobacco: Never Used     Comment: "quit smoking in ~ 1970"  . Alcohol use 8.4 oz/week  7 Shots of liquor, 7 Standard drinks or equivalent per week     Comment: 1 drink daily wine or liquor per pt     Allergies   Patient has no known allergies.   Review of Systems Review of Systems  Constitutional: Negative for chills and fever.  HENT: Negative.   Respiratory: Negative.  Negative for shortness of breath.   Cardiovascular:       See HPI.  Gastrointestinal: Positive for abdominal pain, nausea and vomiting.  Genitourinary: Negative for dysuria, flank pain and hematuria.  Musculoskeletal: Negative.   Skin: Negative.   Neurological: Negative.      Physical Exam Updated Vital Signs BP (!) 137/50   Pulse 79   Temp 98.7 F (37.1 C) (Oral)   Resp 25   Ht 5\' 5"  (1.651 m)   Wt  83.9 kg   SpO2 94%   BMI 30.79 kg/m   Physical Exam  Constitutional: She appears well-developed and well-nourished.  HENT:  Head: Normocephalic.  Neck: Normal range of motion. Neck supple.  Cardiovascular: Normal rate and regular rhythm.   No murmur heard. Pulmonary/Chest: Effort normal and breath sounds normal. She has no wheezes. She has no rales.  Abdominal: Soft. Bowel sounds are normal. There is no tenderness (Mild periumbilical tenderness. ). There is no rebound and no guarding.  Genitourinary:  Genitourinary Comments: No CVA tenderness.   Musculoskeletal: Normal range of motion.  Neurological: She is alert. No cranial nerve deficit.  Skin: Skin is warm and dry. No rash noted.  Psychiatric: She has a normal mood and affect.     ED Treatments / Results  Labs (all labs ordered are listed, but only abnormal results are displayed) Labs Reviewed  COMPREHENSIVE METABOLIC PANEL - Abnormal; Notable for the following:       Result Value   Glucose, Bld 150 (*)    All other components within normal limits  LIPASE, BLOOD  CBC  URINALYSIS, ROUTINE W REFLEX MICROSCOPIC  I-STAT TROPOININ, ED   Results for orders placed or performed during the hospital encounter of 12/16/16  Lipase, blood  Result Value Ref Range   Lipase 25 11 - 51 U/L  Comprehensive metabolic panel  Result Value Ref Range   Sodium 141 135 - 145 mmol/L   Potassium 3.7 3.5 - 5.1 mmol/L   Chloride 106 101 - 111 mmol/L   CO2 23 22 - 32 mmol/L   Glucose, Bld 150 (H) 65 - 99 mg/dL   BUN 11 6 - 20 mg/dL   Creatinine, Ser 0.83 0.44 - 1.00 mg/dL   Calcium 9.5 8.9 - 10.3 mg/dL   Total Protein 7.1 6.5 - 8.1 g/dL   Albumin 4.2 3.5 - 5.0 g/dL   AST 25 15 - 41 U/L   ALT 16 14 - 54 U/L   Alkaline Phosphatase 102 38 - 126 U/L   Total Bilirubin 0.8 0.3 - 1.2 mg/dL   GFR calc non Af Amer >60 >60 mL/min   GFR calc Af Amer >60 >60 mL/min   Anion gap 12 5 - 15  CBC  Result Value Ref Range   WBC 9.2 4.0 - 10.5 K/uL    RBC 4.71 3.87 - 5.11 MIL/uL   Hemoglobin 14.8 12.0 - 15.0 g/dL   HCT 44.9 36.0 - 46.0 %   MCV 95.3 78.0 - 100.0 fL   MCH 31.4 26.0 - 34.0 pg   MCHC 33.0 30.0 - 36.0 g/dL   RDW 13.2 11.5 - 15.5 %  Platelets 256 150 - 400 K/uL  Urinalysis, Routine w reflex microscopic  Result Value Ref Range   Color, Urine YELLOW YELLOW   APPearance HAZY (A) CLEAR   Specific Gravity, Urine 1.024 1.005 - 1.030   pH 5.0 5.0 - 8.0   Glucose, UA NEGATIVE NEGATIVE mg/dL   Hgb urine dipstick SMALL (A) NEGATIVE   Bilirubin Urine NEGATIVE NEGATIVE   Ketones, ur NEGATIVE NEGATIVE mg/dL   Protein, ur NEGATIVE NEGATIVE mg/dL   Nitrite NEGATIVE NEGATIVE   Leukocytes, UA SMALL (A) NEGATIVE   RBC / HPF NONE SEEN 0 - 5 RBC/hpf   WBC, UA NONE SEEN 0 - 5 WBC/hpf   Bacteria, UA NONE SEEN NONE SEEN   Squamous Epithelial / LPF 0-5 (A) NONE SEEN   Mucous PRESENT    Hyaline Casts, UA PRESENT    Ca Oxalate Crys, UA PRESENT   I-Stat Troponin, ED (not at [facility])  Result Value Ref Range   Troponin i, poc 0.00 0.00 - 0.08 ng/mL   Comment 3           Ct Renal Stone Study  Result Date: 12/16/2016 CLINICAL DATA:  76 year old female with right-sided chest and back pain with nausea and vomiting since this morning. History kidney stones. Initial encounter. EXAM: CT ABDOMEN AND PELVIS WITHOUT CONTRAST TECHNIQUE: Multidetector CT imaging of the abdomen and pelvis was performed following the standard protocol without IV contrast. COMPARISON:  01/06/2016 CT. FINDINGS: Lower chest: Tiny calcified granuloma medial left lung base. Mild scarring/ atelectasis lung bases. Heart size within normal limits. Mitral valve calcifications. Hepatobiliary: Taking into account limitation by non contrast imaging, no focal worrisome hepatic lesion. Prominent size gallbladder containing 2 cm gallstone without CT evidence of inflammation however if this is of concern, ultrasound recommended. Pancreas: Taking into account limitation by non contrast imaging,  no mass or inflammation. Spleen: Taking into account limitation by non contrast imaging, no mass or enlargement. Adrenals/Urinary Tract: No renal or ureteral obstructing stone or hydronephrosis. Taking into account limitation by non contrast imaging, no renal or adrenal mass. Urinary bladder contents appear dense. Significance/etiology indeterminate. Stomach/Bowel: Diverticula and muscular hypertrophy most notable sigmoid colon consistent with diverticulosis without CT evidence of diverticulitis. Portions of colon are under distended and evaluation limited although no extraluminal bowel inflammatory process, free fluid or free air noted. No inflammation surrounds the appendix. Moderate-size hiatal hernia. Vascular/Lymphatic: Aortic and iliac artery calcifications without aneurysmal dilation. No adenopathy. Reproductive: Pedunculated 2.4 cm fibroid left fundal region suspected. Left ovary top-normal in size. Other: No bowel containing hernia. Musculoskeletal: Degenerative changes lumbar spine most notable L3-4 through L5-S1. IMPRESSION: No renal or ureteral obstructing stone or hydronephrosis. Prominent size gallbladder containing 2 cm gallstone without CT evidence of inflammation however if this is of concern, ultrasound recommended. Sigmoid diverticulosis. Portions of colon are under distended and evaluation limited although no extraluminal bowel inflammatory process, free fluid or free air noted. No inflammation surrounds the appendix. Moderate-size hiatal hernia. Urinary bladder contents appear dense. Significance/etiology indeterminate. 2.4 cm left fundal fibroid suspected. Top-normal size of left ovary. Degenerative changes lower lumbar spine. Electronically Signed   By: Genia Del M.D.   On: 12/16/2016 06:31     EKG  EKG Interpretation None       Radiology No results found.  Procedures Procedures (including critical care time)  Medications Ordered in ED Medications  ondansetron (ZOFRAN)  injection 4 mg (0 mg Intravenous Hold 12/16/16 0512)  ondansetron (ZOFRAN) injection 4 mg (4 mg Intravenous Given 12/16/16  0422)  HYDROmorphone (DILAUDID) injection 0.5 mg (0.5 mg Intravenous Given 12/16/16 0509)     Initial Impression / Assessment and Plan / ED Course  I have reviewed the triage vital signs and the nursing notes.  Pertinent labs & imaging results that were available during my care of the patient were reviewed by me and considered in my medical decision making (see chart for details).     Patient presents with predominant symptom of nausea that woke her from sleep. No SOB, cough or fever. She had brief, fleeting right chest/shoulder discomfort and later onset of mid-abdominal pain. No back or flank pain.  She reports history of kidney stones that felt the same way as tonight, including being woken by nausea previously. CT renal study negative for renal or ureteral stones. Gall stone seen without evidence of inflammation of gall bladder, [place] recommended.   Patient sleeping on re-evaluation. Will obtain US and continue to observe. Patient care signed out to oncoming provider team for completion of care.   Final Clinical Impressions(s) / ED Diagnoses   Final diagnoses:  None   1. Abdominal pain 2. Nausea and vomiting  New Prescriptions New Prescriptions   No medications on file     Charlann Lange, PA-C 12/16/16 [place], MD 12/18/16 [phone]

## 2016-12-16 NOTE — Progress Notes (Signed)
Patient has home CPAP set up and ready to use. RT provided sterile water for patient. Patient states she can place CPAP on and off when ready. RT will continue to monitor.

## 2016-12-16 NOTE — Progress Notes (Signed)
Pharmacy Antibiotic Note  Melinda Lucas is a 76 y.o. female admitted on 12/16/2016 with intra-abdominal infection.  Pharmacy has been consulted for ceftriaxone dosing. Afebrile, WBC wnl.  Ceftriaxone 2g IV q24h already ordered - dose appropriate to continue.  Plan: Ceftriaxone 2g IV q24h F/u clinical progress, LOT Not renally adjusted - Rx will s/o consult  Height: 5\' 5"  (165.1 cm) Weight: 185 lb (83.9 kg) IBW/kg (Calculated) : 57  Temp (24hrs), Avg:98.7 F (37.1 C), Min:98.7 F (37.1 C), Max:98.7 F (37.1 C)   Recent Labs Lab 12/16/16 0410  WBC 9.2  CREATININE 0.83    Estimated Creatinine Clearance: 60.8 mL/min (by C-G formula based on SCr of 0.83 mg/dL).    No Known Allergies  Elicia Lamp, PharmD, BCPS Clinical Pharmacist 12/16/2016 11:24 AM

## 2016-12-16 NOTE — ED Provider Notes (Signed)
Sign out from [employer], PA-C  Nausea onset around 1am; abdominal pain began shortly after  R sided chest pain fleeting, then abdominal pain   CT shows large gallstone (2cm), labs normal; RUQ [place], CXR, delta trop, BNP pending, anticipate discharge with follow up to general surgery  Delta troponin negative, BNP WNL. Abdominal ultrasound shows cholelithiasis with gallbladder wall thickening and apparent gallbladder wall edema; ultrasound imaging features are highly suspicious for acute cholecystitis.  I consulted general surgery and spoke with Jerene Pitch, [place], who will evaluate the patient and requests medicine admission. I consulted with Dr. Marily Memos with [facility] who will admit the patient. Rocephin initiated in the ED. Pain and nausea controlled with Dilaudid and Zofran.   Frederica Kuster, PA-C 12/16/16 1127

## 2016-12-16 NOTE — Consult Note (Signed)
[facility] Surgery Consult Note  Melinda Lucas Oct 14, 1941  [id number].    Requesting MD: Dan Europe Chief Complaint/Reason for Consult: Abdominal pain  HPI:  Melinda Lucas is a 76yo female who presented to [facility] earlier this morning with acute onset crampy lower abdominal pain. States that the pain started around 1AM this morning and has gradually worsened. It has now migrated to the RUQ and is constant. She has had associated nausea, vomiting, and chills. Denies fever, diarrhea, constipation, or dysuria. States that she has never had pain like this before. She could not find anything at home to help with the pain, therefore she came to the ED. Pain somewhat improved in ED. Last meal last night Last BM this morning  Hospital workup: - u/s shows cholelithiasis with gallbladder wall thickening and apparent gallbladder wall edema, highly suspicious for acute cholecystitis - WBC and LFTs WNL  PMH significant for Atrial fibrillation s/p ablation 2017 (on eliquis), GERD, OSA on CPAP Abdominal surgical history includes tubal ligation Anticoagulants: eliquis (last dose 2/26~2100) Nonsmoker Lives at home with her husband  ROS: Review of Systems  Constitutional: Positive for chills. Negative for fever.  HENT: Negative.   Eyes: Negative.   Respiratory: Positive for shortness of breath. Negative for cough and wheezing.   Cardiovascular: Negative.   Gastrointestinal: Positive for abdominal pain, nausea and vomiting. Negative for blood in stool, constipation, diarrhea, heartburn and melena.  Genitourinary: Negative.   Musculoskeletal: Negative.   Skin: Negative.   Neurological: Negative.   All systems reviewed and otherwise negative except for as above  Family History  Problem Relation Age of Onset  . Colon cancer Mother 29  . AAA (abdominal aortic aneurysm) Father   . Hypertension Brother     Past Medical History:  Diagnosis Date  . Arthritis    "a little in my fingers"  (08/09/2015)  . Atrial fibrillation ([place])   . Cataract    REMOVED BILATERAL  . GERD (gastroesophageal reflux disease)   . Heart murmur   . Melanoma in situ of lower leg ([place])   . Obesity (BMI 30-39.9)   . Obstructive sleep apnea   . Osteopenia   . Sleep apnea     Past Surgical History:  Procedure Laterality Date  . CATARACT EXTRACTION W/ INTRAOCULAR LENS  IMPLANT, BILATERAL Bilateral ~ 2014  . COLONOSCOPY    . DILATION AND CURETTAGE OF UTERUS  1970's  . ELECTROPHYSIOLOGIC STUDY N/A 08/09/2015   Procedure: Atrial Fibrillation Ablation;  Surgeon: Thompson Grayer, MD;  Location: [place] [facility];  Service: Cardiovascular;  Laterality: N/A;  . FRACTURE SURGERY    . MELANOMA EXCISION Left 2010   leg  . TEE WITHOUT CARDIOVERSION N/A 08/09/2015   Procedure: TRANSESOPHAGEAL ECHOCARDIOGRAM (TEE);  Surgeon: Jerline Pain, MD;  Location: [facility] ENDOSCOPY;  Service: Cardiovascular;  Laterality: N/A;  . TUBAL LIGATION  1970's  . WRIST FRACTURE SURGERY Left 2011    Social History:  reports that she has quit smoking. Her smoking use included Cigarettes. She has never used smokeless tobacco. She reports that she drinks about 8.4 oz of alcohol per week . She reports that she does not use drugs.  Allergies: No Known Allergies   (Not in a hospital admission)  Prior to Admission medications   Medication Sig Start Date End Date Taking? Authorizing Provider  atorvastatin (LIPITOR) 20 MG tablet Take 20 mg by mouth daily. 07/07/15  Yes Historical Provider, MD  ELIQUIS 5 MG TABS tablet Take 5 mg by mouth  2 (two) times daily. 06/24/15  Yes Historical Provider, MD  PRESCRIPTION MEDICATION Apply 1 application topically daily. Cream for face, filled at [facility]   Yes Historical Provider, MD  TURMERIC PO Take 1 capsule by mouth daily.    Yes Historical Provider, MD  Vitamin D, Ergocalciferol, (DRISDOL) 50000 UNITS CAPS Take 50,000 Units by mouth every Monday.    Yes Historical Provider, MD    Blood pressure  135/58, pulse 88, temperature 98.7 F (37.1 C), temperature source Oral, resp. rate 17, height '5\' 5"'  (1.651 m), weight 185 lb (83.9 kg), SpO2 97 %. Physical Exam: General: pleasant, WD/WN white female who is laying in bed in NAD HEENT: head is normocephalic, atraumatic.  Sclera are noninjected.  Mouth is pink and moist Heart: regular, rate, and rhythm.  No obvious murmurs, gallops, or rubs noted.  Palpable pedal pulses bilaterally Lungs: CTAB, no wheezes, rhonchi, or rales noted.  Respiratory effort nonlabored Abd: well healed umbilical incision, soft, NT/ND, +BS, no masses, hernias, or organomegaly. Negative murphy sign MS: all 4 extremities are symmetrical with no cyanosis, clubbing, or edema. Skin: warm and dry with no masses, lesions, or rashes Psych: A&Ox3 with an appropriate affect. Neuro: CM 2-12 intact, extremity CSM intact bilaterally, normal speech  Results for orders placed or performed during the hospital encounter of 12/16/16 (from the past 48 hour(s))  Lipase, blood     Status: None   Collection Time: 12/16/16  4:10 AM  Result Value Ref Range   Lipase 25 11 - 51 U/L  Comprehensive metabolic panel     Status: Abnormal   Collection Time: 12/16/16  4:10 AM  Result Value Ref Range   Sodium 141 135 - 145 mmol/L   Potassium 3.7 3.5 - 5.1 mmol/L   Chloride 106 101 - 111 mmol/L   CO2 23 22 - 32 mmol/L   Glucose, Bld 150 (H) 65 - 99 mg/dL   BUN 11 6 - 20 mg/dL   Creatinine, Ser 0.83 0.44 - 1.00 mg/dL   Calcium 9.5 8.9 - 10.3 mg/dL   Total Protein 7.1 6.5 - 8.1 g/dL   Albumin 4.2 3.5 - 5.0 g/dL   AST 25 15 - 41 U/L   ALT 16 14 - 54 U/L   Alkaline Phosphatase 102 38 - 126 U/L   Total Bilirubin 0.8 0.3 - 1.2 mg/dL   GFR calc non Af Amer >60 >60 mL/min   GFR calc Af Amer >60 >60 mL/min    Comment: (NOTE) The eGFR has been calculated using the CKD EPI equation. This calculation has not been validated in all clinical situations. eGFR's persistently <60 mL/min signify possible  Chronic Kidney Disease.    Anion gap 12 5 - 15  CBC     Status: None   Collection Time: 12/16/16  4:10 AM  Result Value Ref Range   WBC 9.2 4.0 - 10.5 K/uL   RBC 4.71 3.87 - 5.11 MIL/uL   Hemoglobin 14.8 12.0 - 15.0 g/dL   HCT 44.9 36.0 - 46.0 %   MCV 95.3 78.0 - 100.0 fL   MCH 31.4 26.0 - 34.0 pg   MCHC 33.0 30.0 - 36.0 g/dL   RDW 13.2 11.5 - 15.5 %   Platelets 256 150 - 400 K/uL  I-Stat Troponin, ED (not at [facility])     Status: None   Collection Time: 12/16/16  4:22 AM  Result Value Ref Range   Troponin i, poc 0.00 0.00 - 0.08 ng/mL   Comment 3  Comment: Due to the release kinetics of cTnI, a negative result within the first hours of the onset of symptoms does not rule out myocardial infarction with certainty. If myocardial infarction is still suspected, repeat the test at appropriate intervals.   Urinalysis, Routine w reflex microscopic     Status: Abnormal   Collection Time: 12/16/16  4:57 AM  Result Value Ref Range   Color, Urine YELLOW YELLOW   APPearance HAZY (A) CLEAR   Specific Gravity, Urine 1.024 1.005 - 1.030   pH 5.0 5.0 - 8.0   Glucose, UA NEGATIVE NEGATIVE mg/dL   Hgb urine dipstick SMALL (A) NEGATIVE   Bilirubin Urine NEGATIVE NEGATIVE   Ketones, ur NEGATIVE NEGATIVE mg/dL   Protein, ur NEGATIVE NEGATIVE mg/dL   Nitrite NEGATIVE NEGATIVE   Leukocytes, UA SMALL (A) NEGATIVE   RBC / HPF NONE SEEN 0 - 5 RBC/hpf   WBC, UA NONE SEEN 0 - 5 WBC/hpf   Bacteria, UA NONE SEEN NONE SEEN   Squamous Epithelial / LPF 0-5 (A) NONE SEEN   Mucous PRESENT    Hyaline Casts, UA PRESENT    Ca Oxalate Crys, UA PRESENT   Brain natriuretic peptide     Status: None   Collection Time: 12/16/16  7:17 AM  Result Value Ref Range   B Natriuretic Peptide 83.5 0.0 - 100.0 pg/mL  I-stat troponin, ED     Status: None   Collection Time: 12/16/16  7:26 AM  Result Value Ref Range   Troponin i, poc 0.00 0.00 - 0.08 ng/mL   Comment 3            Comment: Due to the release  kinetics of cTnI, a negative result within the first hours of the onset of symptoms does not rule out myocardial infarction with certainty. If myocardial infarction is still suspected, repeat the test at appropriate intervals.    Dg Chest 2 View  Result Date: 12/16/2016 CLINICAL DATA:  Chest pain and abdominal pain EXAM: CHEST  2 VIEW COMPARISON:  07/14/2015 FINDINGS: Cardiac shadow is stable. Aortic calcifications are again seen. Lungs are well aerated bilaterally with mild stable interstitial changes. No focal infiltrate or sizable effusion is noted. No acute bony abnormality is seen. IMPRESSION: No active cardiopulmonary disease. Electronically Signed   By: Inez Catalina M.D.   On: 12/16/2016 07:57   Ct Renal Stone Study  Result Date: 12/16/2016 CLINICAL DATA:  76 year old female with right-sided chest and back pain with nausea and vomiting since this morning. History kidney stones. Initial encounter. EXAM: CT ABDOMEN AND PELVIS WITHOUT CONTRAST TECHNIQUE: Multidetector CT imaging of the abdomen and pelvis was performed following the standard protocol without IV contrast. COMPARISON:  01/06/2016 CT. FINDINGS: Lower chest: Tiny calcified granuloma medial left lung base. Mild scarring/ atelectasis lung bases. Heart size within normal limits. Mitral valve calcifications. Hepatobiliary: Taking into account limitation by non contrast imaging, no focal worrisome hepatic lesion. Prominent size gallbladder containing 2 cm gallstone without CT evidence of inflammation however if this is of concern, ultrasound recommended. Pancreas: Taking into account limitation by non contrast imaging, no mass or inflammation. Spleen: Taking into account limitation by non contrast imaging, no mass or enlargement. Adrenals/Urinary Tract: No renal or ureteral obstructing stone or hydronephrosis. Taking into account limitation by non contrast imaging, no renal or adrenal mass. Urinary bladder contents appear dense.  Significance/etiology indeterminate. Stomach/Bowel: Diverticula and muscular hypertrophy most notable sigmoid colon consistent with diverticulosis without CT evidence of diverticulitis. Portions of colon are under  distended and evaluation limited although no extraluminal bowel inflammatory process, free fluid or free air noted. No inflammation surrounds the appendix. Moderate-size hiatal hernia. Vascular/Lymphatic: Aortic and iliac artery calcifications without aneurysmal dilation. No adenopathy. Reproductive: Pedunculated 2.4 cm fibroid left fundal region suspected. Left ovary top-normal in size. Other: No bowel containing hernia. Musculoskeletal: Degenerative changes lumbar spine most notable L3-4 through L5-S1. IMPRESSION: No renal or ureteral obstructing stone or hydronephrosis. Prominent size gallbladder containing 2 cm gallstone without CT evidence of inflammation however if this is of concern, ultrasound recommended. Sigmoid diverticulosis. Portions of colon are under distended and evaluation limited although no extraluminal bowel inflammatory process, free fluid or free air noted. No inflammation surrounds the appendix. Moderate-size hiatal hernia. Urinary bladder contents appear dense. Significance/etiology indeterminate. 2.4 cm left fundal fibroid suspected. Top-normal size of left ovary. Degenerative changes lower lumbar spine. Electronically Signed   By: Genia Del M.D.   On: 12/16/2016 06:31   US Abdomen Limited Ruq  Result Date: 12/16/2016 CLINICAL DATA:  Right upper quadrant pain. EXAM: US ABDOMEN LIMITED - RIGHT UPPER QUADRANT COMPARISON:  CT scan 12/16/2016. FINDINGS: Gallbladder: Gallbladder is distended. 9 mm mobile shadowing stone is identified in the lumen. The gallbladder wall appears thickened and is striated in some areas suggesting gallbladder wall edema. Gallbladder wall thickness measures up to 9 mm. The sonographer reports no sonographic Murphy sign, but patient has apparently  received pain medication prior to this study which makes assessment unreliable. Common bile duct: Diameter: 6 mm Liver: No focal abnormality. IMPRESSION: 1. Cholelithiasis with gallbladder wall thickening and apparent gallbladder wall edema. Ultrasound imaging features are highly suspicious for acute cholecystitis. If clinical exam is equivocal, nuclear scintigraphy may prove helpful to assess for cystic duct obstruction. Electronically Signed   By: Misty Stanley M.D.   On: 12/16/2016 08:36      Assessment/Plan Symptomatic cholelithiasis, possible cholecystitis - surgical history includes tubal ligation - acute onset RUQ pain, nausea, and vomiting this AM - u/s shows cholelithiasis with gallbladder wall thickening and apparent gallbladder wall edema, highly suspicious for acute cholecystitis - WBC and LFTs WNL  Atrial fibrillation s/p ablation 2017 (on eliquis, last dose 2/26~2100) GERD OSA on CPAP  ID - rocephin 2/27>> VTE - SCDs FEN - IVF, full liquids  Plan - Recommend admit to medicine for multiple medical problems. She will need a cholecystectomy this admission but will need to be off eliquis prior to surgery. Rosine for full liquids today. Continue IVF, pain control, antiemetics, antibiotics (rocephin). Will discuss timing of OR with MD. Please continue to hold eliquis.  Jerrye Beavers, [facility] Surgery 12/16/2016, 10:01 AM Pager: [phone] Consults: [phone] Mon-Fri 7:00 am-4:30 pm Sat-Sun 7:00 am-11:30 am

## 2016-12-16 NOTE — Consult Note (Signed)
Cardiology Consult Note  Admit date: 12/16/2016 Name: Melinda Lucas 76 y.o.  female DOB:  1941-08-07 MRN:  [id number]  Today's date:  12/16/2016  Referring Physician:    Dr. Linna Darner  Primary Physician:   Dr. Lutricia Feil  Reason for Consultation:    Preoperative evaluation  IMPRESSIONS: 1.  From a cardiovascular viewpoint may proceed with the planned gallbladder surgery once her Eliquis has had a chance to wash out.  Her surgical risk should be average and no further cardiovascular workup is necessary at this time 2.  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  Aortic atherosclerosis asymptomatic 4.  Long-term anticoagulation with Eliquis 5.  Obesity  RECOMMENDATION: May proceed with surgery without further cardiac workup.  Ideally should hold Eliquis for 48 hours prior to surgery to reduce bleeding.  HISTORY: This very nice 76 year old female is a history of hyperlipidemia as well as paroxysmal atrial fibrillation.  Because of postconversion pauses and paroxysmal atrial fibrillation she underwent ablation by Dr. Rayann Heman in 2016.  She has felt well since then and has been able to exercise and be involved in her normal activities.  She normally has no angina and denies PND or orthopnea or edema.  She was in her usual state of health and even went to exercise class yesterday that she tolerated well.  She awoke around 1 AM with severe nausea and then severe lower abdominal pain and was concerned she was having a cardiac event.  She presented to the emergency room here where an EKG and troponin were normal.  She was found to have significant cholecystitis and plans are for her to have surgery when her Eliquis has washed out of her system.  She normally denies PND or orthopnea or edema.  She does have a history of calcification in her aortic vessels and is on statin therapy for this.  She also has  borderline hypertension.  Her exercise capacity is normally good.  Ventricular function has been normal on her echocardiogram.  Past Medical History:  Diagnosis Date  . Arthritis    "a little in my fingers" (08/09/2015)  . Atrial fibrillation ([place])   . Cataract    REMOVED BILATERAL  . GERD (gastroesophageal reflux disease)   . Heart murmur   . Melanoma in situ of lower leg ([place])   . Obesity (BMI 30-39.9)   . Obstructive sleep apnea   . Osteopenia   . Sleep apnea       Past Surgical History:  Procedure Laterality Date  . CATARACT EXTRACTION W/ INTRAOCULAR LENS  IMPLANT, BILATERAL Bilateral ~ 2014  . COLONOSCOPY    . DILATION AND CURETTAGE OF UTERUS  1970's  . ELECTROPHYSIOLOGIC STUDY N/A 08/09/2015   Procedure: Atrial Fibrillation Ablation;  Surgeon: Thompson Grayer, MD;  Location: [place] [facility];  Service: Cardiovascular;  Laterality: N/A;  . FRACTURE SURGERY    . MELANOMA EXCISION Left 2010   leg  . TEE WITHOUT CARDIOVERSION N/A 08/09/2015   Procedure: TRANSESOPHAGEAL ECHOCARDIOGRAM (TEE);  Surgeon: Jerline Pain, MD;  Location: [facility] ENDOSCOPY;  Service: Cardiovascular;  Laterality: N/A;  . TUBAL LIGATION  1970's  . WRIST FRACTURE SURGERY Left 2011     Allergies:  has No Known Allergies.   Medications: Prior to Admission medications   Medication Sig Start Date End Date Taking? Authorizing Provider  atorvastatin (LIPITOR) 20 MG tablet Take 20 mg by mouth daily. 07/07/15  Yes Historical Provider, MD  ELIQUIS 5 MG TABS tablet Take 5 mg by mouth 2 (two) times daily. 06/24/15  Yes Historical Provider, MD  PRESCRIPTION MEDICATION Apply 1 application topically daily. Cream for face, filled at [facility]   Yes Historical Provider, MD  tretinoin (RETIN-A) 0.025 % cream Apply 1 application topically at bedtime.   Yes Historical Provider, MD  TURMERIC PO Take 1 capsule by mouth daily.    Yes Historical Provider, MD  Vitamin D, Ergocalciferol, (DRISDOL) 50000 UNITS CAPS Take 50,000 Units by  mouth every Monday.    Yes Historical Provider, MD    Family History: Family Status  Relation Status  . Mother Deceased at age 51  . Father Deceased at age 11  . Brother Alive  . Brother     Social History:   reports that she has quit smoking. Her smoking use included Cigarettes. She has never used smokeless tobacco. She reports that she drinks about 8.4 oz of alcohol per week . She reports that she does not use drugs.   Social History   Social History Narrative   Trains dogs currently, retired Cabin crew    Review of Systems: She has been obese for several years, mild arthritis, normally no shortness of breath other than as noted above remainder of the review of systems is unremarkable  Physical Exam: BP 128/66   Pulse 88   Temp 98.7 F (37.1 C) (Oral)   Resp 25   Ht 5\' 5"  (1.651 m)   Wt 83.9 kg (185 lb)   SpO2 96%   BMI 30.79 kg/m    General appearance: Pleasant obese female currently in no acute distress Head: Normocephalic, without obvious abnormality Eyes: conjunctivae/corneas clear. PERRL, EOM's intact. Fundi benign. Neck: no adenopathy, no carotid bruit, no JVD and supple, symmetrical, trachea midline Lungs: clear to auscultation bilaterally Heart: regular rate and rhythm, S1, S2 normal, over 6 systolic murmur at aortic area  Abdomen: Very mild deep right upper quadrant tenderness, obese, soft bowel sounds present Pelvic: deferred Extremities: extremities normal, atraumatic, no cyanosis or edema Pulses: 2+ and symmetric Skin: Skin color, texture, turgor normal. No rashes or lesions Neurologic: Grossly normal Psych: Alert and oriented x 3 Labs: CBC  Recent Labs  12/16/16 0410  WBC 9.2  RBC 4.71  HGB 14.8  HCT 44.9  PLT 256  MCV 95.3  MCH 31.4  MCHC 33.0  RDW 13.2   CMP   Recent Labs  12/16/16 0410  NA 141  K 3.7  CL 106  CO2 23  GLUCOSE 150*  BUN 11  CREATININE 0.83  CALCIUM 9.5  PROT 7.1  ALBUMIN 4.2  AST 25  ALT 16  ALKPHOS 102   BILITOT 0.8  GFRNONAA >60  GFRAA >60   BNP (last 3 results) BNP    Component Value Date/Time   BNP 83.5 12/16/2016 0717   Cardiac Panel (last 3 results) Troponin (Point of Care Test)  Recent Labs  12/16/16 0726  TROPIPOC 0.00    Radiology:  Personally reviewed by me, aortic arch calcifications, no acute cardiopulmonary disease  EKG: Normal EKG Independently reviewed by me  Signed:  W. Doristine Church MD [facility]   Cardiology Consultant  12/16/2016, 12:12 PM

## 2016-12-16 NOTE — ED Notes (Signed)
Ambulatory w/ steady gait to restroom. 

## 2016-12-16 NOTE — ED Notes (Signed)
Pt woke up at about 0100 this morning with nausea, bloating, sob, and right sided chest pain. Pt called cardiologist and decided to come here. Pt vomited during triage.

## 2016-12-16 NOTE — ED Notes (Signed)
Nurse collected labs. 

## 2016-12-17 ENCOUNTER — Inpatient Hospital Stay (HOSPITAL_COMMUNITY): Payer: Medicare Other | Admitting: Anesthesiology

## 2016-12-17 ENCOUNTER — Encounter (HOSPITAL_COMMUNITY): Payer: Self-pay | Admitting: Certified Registered"

## 2016-12-17 ENCOUNTER — Encounter (HOSPITAL_COMMUNITY)
Admission: EM | Disposition: A | Discharge status: Home / Self Care | Payer: Self-pay | Source: Home / Self Care | Attending: Internal Medicine

## 2016-12-17 DIAGNOSIS — K810 Acute cholecystitis: Secondary | ICD-10-CM

## 2016-12-17 DIAGNOSIS — R1011 Right upper quadrant pain: Secondary | ICD-10-CM

## 2016-12-17 DIAGNOSIS — E784 Other hyperlipidemia: Secondary | ICD-10-CM

## 2016-12-17 DIAGNOSIS — I10 Essential (primary) hypertension: Secondary | ICD-10-CM

## 2016-12-17 DIAGNOSIS — I480 Paroxysmal atrial fibrillation: Secondary | ICD-10-CM

## 2016-12-17 HISTORY — PX: CHOLECYSTECTOMY: SHX55

## 2016-12-17 LAB — CBC
HCT: 38.3 % (ref 36.0–46.0)
Hemoglobin: 12.2 g/dL (ref 12.0–15.0)
MCH: 30.9 pg (ref 26.0–34.0)
MCHC: 31.9 g/dL (ref 30.0–36.0)
MCV: 97 fL (ref 78.0–100.0)
PLATELETS: 210 10*3/uL (ref 150–400)
RBC: 3.95 MIL/uL (ref 3.87–5.11)
RDW: 13.5 % (ref 11.5–15.5)
WBC: 5 10*3/uL (ref 4.0–10.5)

## 2016-12-17 LAB — COMPREHENSIVE METABOLIC PANEL
ALT: 142 U/L — AB (ref 14–54)
AST: 134 U/L — AB (ref 15–41)
Albumin: 3.2 g/dL — ABNORMAL LOW (ref 3.5–5.0)
Alkaline Phosphatase: 146 U/L — ABNORMAL HIGH (ref 38–126)
Anion gap: 6 (ref 5–15)
BUN: 6 mg/dL (ref 6–20)
CHLORIDE: 107 mmol/L (ref 101–111)
CO2: 27 mmol/L (ref 22–32)
CREATININE: 0.81 mg/dL (ref 0.44–1.00)
Calcium: 8.5 mg/dL — ABNORMAL LOW (ref 8.9–10.3)
GFR calc Af Amer: >60 mL/min (ref >60)
GFR calc non Af Amer: >60 mL/min (ref >60)
Glucose, Bld: 113 mg/dL — ABNORMAL HIGH (ref 65–99)
Potassium: 4.2 mmol/L (ref 3.5–5.1)
SODIUM: 140 mmol/L (ref 135–145)
Total Bilirubin: 1.2 mg/dL (ref 0.3–1.2)
Total Protein: 5.6 g/dL — ABNORMAL LOW (ref 6.5–8.1)

## 2016-12-17 LAB — TROPONIN I

## 2016-12-17 SURGERY — LAPAROSCOPIC CHOLECYSTECTOMY
Anesthesia: General | Site: Abdomen

## 2016-12-17 MED ORDER — ROCURONIUM BROMIDE 10 MG/ML (PF) SYRINGE
PREFILLED_SYRINGE | INTRAVENOUS | Status: DC | PRN
  Administered 2016-12-17: 40 mg via INTRAVENOUS

## 2016-12-17 MED ORDER — PROPOFOL 10 MG/ML IV BOLUS
INTRAVENOUS | Status: DC | PRN
  Administered 2016-12-17: 120 mg via INTRAVENOUS

## 2016-12-17 MED ORDER — LIDOCAINE 2% (20 MG/ML) 5 ML SYRINGE
INTRAMUSCULAR | Status: DC | PRN
  Administered 2016-12-17: 100 mg via INTRAVENOUS

## 2016-12-17 MED ORDER — SUGAMMADEX SODIUM 200 MG/2ML IV SOLN
INTRAVENOUS | Status: AC
  Filled 2016-12-17: qty 2

## 2016-12-17 MED ORDER — ONDANSETRON HCL 4 MG/2ML IJ SOLN
INTRAMUSCULAR | Status: AC
  Filled 2016-12-17: qty 2

## 2016-12-17 MED ORDER — FENTANYL CITRATE (PF) 100 MCG/2ML IJ SOLN
INTRAMUSCULAR | Status: AC
  Filled 2016-12-17: qty 4

## 2016-12-17 MED ORDER — BUPIVACAINE HCL (PF) 0.25 % IJ SOLN
INTRAMUSCULAR | Status: DC | PRN
  Administered 2016-12-17: 20 mL

## 2016-12-17 MED ORDER — CHLORHEXIDINE GLUCONATE CLOTH 2 % EX PADS: 6.0000 | MEDICATED_PAD | Freq: Every day | CUTANEOUS | Status: DC

## 2016-12-17 MED ORDER — 0.9 % SODIUM CHLORIDE (POUR BTL) OPTIME
TOPICAL | Status: DC | PRN
  Administered 2016-12-17: 1000 mL

## 2016-12-17 MED ORDER — MIDAZOLAM HCL 5 MG/5ML IJ SOLN
INTRAMUSCULAR | Status: DC | PRN
  Administered 2016-12-17: 2 mg via INTRAVENOUS

## 2016-12-17 MED ORDER — HYDROCODONE-ACETAMINOPHEN 5-325 MG PO TABS
1.0000 | ORAL_TABLET | ORAL | Status: DC | PRN
  Administered 2016-12-17: 1 via ORAL
  Administered 2016-12-17: 2 via ORAL
  Administered 2016-12-18 (×2): 1 via ORAL
  Filled 2016-12-17 (×2): qty 1
  Filled 2016-12-17 (×2): qty 2

## 2016-12-17 MED ORDER — LACTATED RINGERS IV SOLN
INTRAVENOUS | Status: DC
  Administered 2016-12-17: 09:00:00 via INTRAVENOUS

## 2016-12-17 MED ORDER — CEFAZOLIN SODIUM 1 G IJ SOLR
INTRAMUSCULAR | Status: DC | PRN
  Administered 2016-12-17: 2 g

## 2016-12-17 MED ORDER — MUPIROCIN 2 % EX OINT
1.0000 "application " | TOPICAL_OINTMENT | Freq: Two times a day (BID) | CUTANEOUS | Status: DC
  Administered 2016-12-17 – 2016-12-18 (×2): 1 via NASAL
  Filled 2016-12-17: qty 22

## 2016-12-17 MED ORDER — SODIUM CHLORIDE 0.9 % IR SOLN
Status: DC | PRN
  Administered 2016-12-17: 1000 mL

## 2016-12-17 MED ORDER — BUPIVACAINE HCL (PF) 0.25 % IJ SOLN
INTRAMUSCULAR | Status: AC
  Filled 2016-12-17: qty 30

## 2016-12-17 MED ORDER — FENTANYL CITRATE (PF) 100 MCG/2ML IJ SOLN
INTRAMUSCULAR | Status: DC | PRN
  Administered 2016-12-17: 50 ug via INTRAVENOUS
  Administered 2016-12-17: 100 ug via INTRAVENOUS
  Administered 2016-12-17: 50 ug via INTRAVENOUS

## 2016-12-17 MED ORDER — ONDANSETRON HCL 4 MG/2ML IJ SOLN
INTRAMUSCULAR | Status: DC | PRN
Start: 2016-12-17 — End: 2016-12-17
  Administered 2016-12-17: 4 mg via INTRAVENOUS

## 2016-12-17 MED ORDER — CEFAZOLIN SODIUM 1 G IJ SOLR
INTRAMUSCULAR | Status: AC
  Filled 2016-12-17: qty 20

## 2016-12-17 MED ORDER — HYDROMORPHONE HCL 2 MG/ML IJ SOLN: 0.5000 mg | INTRAMUSCULAR | Status: DC | PRN

## 2016-12-17 MED ORDER — LIDOCAINE 2% (20 MG/ML) 5 ML SYRINGE
INTRAMUSCULAR | Status: AC
  Filled 2016-12-17: qty 5

## 2016-12-17 MED ORDER — PROPOFOL 10 MG/ML IV BOLUS
INTRAVENOUS | Status: AC
  Filled 2016-12-17: qty 20

## 2016-12-17 MED ORDER — ROCURONIUM BROMIDE 50 MG/5ML IV SOSY
PREFILLED_SYRINGE | INTRAVENOUS | Status: AC
  Filled 2016-12-17: qty 5

## 2016-12-17 MED ORDER — MIDAZOLAM HCL 2 MG/2ML IJ SOLN
INTRAMUSCULAR | Status: AC
  Filled 2016-12-17: qty 2

## 2016-12-17 MED ORDER — SUGAMMADEX SODIUM 200 MG/2ML IV SOLN
INTRAVENOUS | Status: DC | PRN
  Administered 2016-12-17: 200 mg via INTRAVENOUS

## 2016-12-17 SURGICAL SUPPLY — 33 items
ADH SKN CLS APL DERMABOND .7 (GAUZE/BANDAGES/DRESSINGS) ×1
APPLIER CLIP 5 13 M/L LIGAMAX5 (MISCELLANEOUS) ×3
APR CLP MED LRG 5 ANG JAW (MISCELLANEOUS) ×1
BAG SPEC RTRVL LRG 6X4 10 (ENDOMECHANICALS) ×1
CANISTER SUCT 3000ML PPV (MISCELLANEOUS) ×3 IMPLANT
CHLORAPREP W/TINT 26ML (MISCELLANEOUS) ×3 IMPLANT
CLIP APPLIE 5 13 M/L LIGAMAX5 (MISCELLANEOUS) ×1 IMPLANT
COVER SURGICAL LIGHT HANDLE (MISCELLANEOUS) ×3 IMPLANT
DERMABOND ADVANCED (GAUZE/BANDAGES/DRESSINGS) ×2
DERMABOND ADVANCED .7 DNX12 (GAUZE/BANDAGES/DRESSINGS) ×1 IMPLANT
ELECT REM PT RETURN 9FT ADLT (ELECTROSURGICAL) ×3
ELECTRODE REM PT RTRN 9FT ADLT (ELECTROSURGICAL) ×1 IMPLANT
GLOVE SURG SIGNA 7.5 PF LTX (GLOVE) ×3 IMPLANT
GOWN STRL REUS W/ TWL LRG LVL3 (GOWN DISPOSABLE) ×2 IMPLANT
GOWN STRL REUS W/ TWL XL LVL3 (GOWN DISPOSABLE) ×1 IMPLANT
GOWN STRL REUS W/TWL LRG LVL3 (GOWN DISPOSABLE) ×6
GOWN STRL REUS W/TWL XL LVL3 (GOWN DISPOSABLE) ×3
KIT BASIN OR (CUSTOM PROCEDURE TRAY) ×3 IMPLANT
KIT ROOM TURNOVER OR (KITS) ×3 IMPLANT
NS IRRIG 1000ML POUR BTL (IV SOLUTION) ×3 IMPLANT
PAD ARMBOARD 7.5X6 YLW CONV (MISCELLANEOUS) ×3 IMPLANT
POUCH SPECIMEN RETRIEVAL 10MM (ENDOMECHANICALS) ×3 IMPLANT
SCISSORS LAP 5X35 DISP (ENDOMECHANICALS) ×3 IMPLANT
SET IRRIG TUBING LAPAROSCOPIC (IRRIGATION / IRRIGATOR) ×3 IMPLANT
SLEEVE ENDOPATH XCEL 5M (ENDOMECHANICALS) ×6 IMPLANT
SPECIMEN JAR SMALL (MISCELLANEOUS) ×3 IMPLANT
SUT MNCRL AB 4-0 PS2 18 (SUTURE) ×3 IMPLANT
TOWEL OR 17X24 6PK STRL BLUE (TOWEL DISPOSABLE) ×3 IMPLANT
TOWEL OR 17X26 10 PK STRL BLUE (TOWEL DISPOSABLE) ×3 IMPLANT
TRAY LAPAROSCOPIC MC (CUSTOM PROCEDURE TRAY) ×3 IMPLANT
TROCAR XCEL BLUNT TIP 100MML (ENDOMECHANICALS) ×3 IMPLANT
TROCAR XCEL NON-BLD 5MMX100MML (ENDOMECHANICALS) ×3 IMPLANT
TUBING INSUFFLATION (TUBING) ×3 IMPLANT

## 2016-12-17 NOTE — Progress Notes (Signed)
[facility]                                                                              Patient Demographics  Melinda Lucas, is a 76 y.o. female, DOB - 06-19-1941, [id number]  Admit date - 12/16/2016   Admitting Physician Waldemar Dickens, MD  Outpatient Primary MD for the patient is Marton Redwood, MD  Outpatient specialists:   LOS - 1  days    Chief Complaint  Patient presents with  . Chest Pain  . Emesis  . Shortness of Breath       Brief summary   [name] is a 76 y.o. female with medical history significant of Afib, GERD, Melanoma, Obesity, OSA, osteopenia, presenting with Abdominal pain. Abdominal pain was initially described as a nausea feeling in the lower abdomen across the right and left sides with mild crampy pain. Prominent size gallbladder containing 2 cm gallstone without CT evidence of inflammation  Assessment & Plan    Principal problem Acute cholecystitis: - CT abdomen showed 2 cm gallstone, abdominal ultrasound showed cholelithiasis with gallbladder wall thickening and the gallbladder wall edema suspicious for acute cholecystitis - Gen. surgery consulted, recommended laparoscopic cholecystectomy today  Active problems Paroxysmal atrial fibrillation -  h/o Ablation on 07/2015. Last dose of Eliquis 21:00 on 12/15/16. EKG showing sinus w/o evidence of ACS. pts complained of CP likely referred from cholecystitis. Last TEE showing EF [id number] w/ nml diastolic function.  BNP nml. Poc Trop nml. - Troponins 4 negative - Patient seen for pre-op by cardiology -  Hold Eliquis for surgery, restart when cleared by general surgery  HLD: - continue lipitor  HTN: intermittently elevated likely from pain. - hydralazine prn  OSA: - CPAP at night.   Code Status: Full CODE STATUS DVT Prophylaxis: eliquis on hold for surgery  Family Communication: Discussed in detail with the patient, all imaging results, lab results explained to the  patient   Disposition Plan: Likely in a.m. once cleared by general surgery  Time Spent in minutes   25 minutes  Procedures:    Consultants:   Surgery  Antimicrobials:      Medications  Scheduled Meds: . atorvastatin  20 mg Oral q1800  . Chlorhexidine Gluconate Cloth  6 each Topical Daily  . mupirocin ointment  1 application Nasal BID  . ondansetron (ZOFRAN) IV  4 mg Intravenous Once   Continuous Infusions: . sodium chloride 50 mL/hr at 12/17/16 1407  . lactated ringers     PRN Meds:.hydrALAZINE, HYDROcodone-acetaminophen, HYDROmorphone (DILAUDID) injection   Antibiotics   Anti-infectives    Start     Dose/Rate Route Frequency Ordered Stop   12/17/16 0900  cefTRIAXone (ROCEPHIN) 2 g in dextrose 5 % 50 mL IVPB  Status:  Discontinued     2 g 100 mL/hr over 30 Minutes Intravenous Every 24 hours 12/16/16 1013 12/17/16 1150   12/16/16 0900  cefTRIAXone (ROCEPHIN) 2 g in dextrose 5 % 50 mL IVPB     2 g 100 mL/hr over 30 Minutes Intravenous  Once 12/16/16 0854 12/16/16 1000        Subjective:   Melinda Holler  Lucas was seen and examined today.  He feels a lot better today, abdominal pain is controlled. No nausea or vomiting. Patient denies dizziness, chest pain, shortness of breath,  new weakness, numbess, tingling. No acute events overnight.    Objective:   Vitals:   12/17/16 1007 12/17/16 1030 12/17/16 1045 12/17/16 1109  BP: (!) 160/69 (!) 140/59 (!) 135/56 (!) 131/57  Pulse: 88 82 72 93  Resp:  (!) 26 (!) 26 20  Temp: 97.4 F (36.3 C)  97.2 F (36.2 C) 98.5 F (36.9 C)  TempSrc:    Oral  SpO2: 93% 94% 93% 93%  Weight:      Height:        Intake/Output Summary (Last 24 hours) at 12/17/16 1410 Last data filed at 12/17/16 1354  Gross per 24 hour  Intake           2002.5 ml  Output              520 ml  Net           1482.5 ml     Wt Readings from Last 3 Encounters:  12/16/16 83.9 kg (185 lb)  08/14/16 86.8 kg (191 lb 6.4 oz)  04/14/16 83.9 kg (185 lb)      Exam  General: Alert and oriented x 3, NAD  HEENT:  PERRLA, EOMI, Anicteric Sclera, mucous membranes moist.   Neck: Supple, no JVD, no masses  Cardiovascular: S1 S2 auscultated, no rubs, murmurs or gallops. Regular rate and rhythm.  Respiratory: Clear to auscultation bilaterally, no wheezing, rales or rhonchi  Gastrointestinal: Soft, nontender, nondistended, + bowel sounds  Ext: no cyanosis clubbing or edema  Neuro: AAOx3, Cr N's II- XII. Strength 5/5 upper and lower extremities bilaterally  Skin: No rashes  Psych: Normal affect and demeanor, alert and oriented x3    Data Reviewed:  I have personally reviewed following labs and imaging studies  Micro Results Recent Results (from the past 240 hour(s))  Surgical PCR screen     Status: Abnormal   Collection Time: 12/16/16  6:06 PM  Result Value Ref Range Status   MRSA, PCR NEGATIVE NEGATIVE Final   Staphylococcus aureus POSITIVE (A) NEGATIVE Final    Comment:        The Xpert SA Assay (FDA approved for NASAL specimens in patients over 50 years of age), is one component of a comprehensive surveillance program.  Test performance has been validated by [facility] for patients greater than or equal to 51 year old. It is not intended to diagnose infection nor to guide or monitor treatment.     Radiology Reports Dg Chest 2 View  Result Date: 12/16/2016 CLINICAL DATA:  Chest pain and abdominal pain EXAM: CHEST  2 VIEW COMPARISON:  07/14/2015 FINDINGS: Cardiac shadow is stable. Aortic calcifications are again seen. Lungs are well aerated bilaterally with mild stable interstitial changes. No focal infiltrate or sizable effusion is noted. No acute bony abnormality is seen. IMPRESSION: No active cardiopulmonary disease. Electronically Signed   By: Inez Catalina M.D.   On: 12/16/2016 07:57   Ct Renal Stone Study  Result Date: 12/16/2016 CLINICAL DATA:  76 year old female with right-sided chest and back pain with nausea  and vomiting since this morning. History kidney stones. Initial encounter. EXAM: CT ABDOMEN AND PELVIS WITHOUT CONTRAST TECHNIQUE: Multidetector CT imaging of the abdomen and pelvis was performed following the standard protocol without IV contrast. COMPARISON:  01/06/2016 CT. FINDINGS: Lower chest: Tiny calcified granuloma medial left  lung base. Mild scarring/ atelectasis lung bases. Heart size within normal limits. Mitral valve calcifications. Hepatobiliary: Taking into account limitation by non contrast imaging, no focal worrisome hepatic lesion. Prominent size gallbladder containing 2 cm gallstone without CT evidence of inflammation however if this is of concern, ultrasound recommended. Pancreas: Taking into account limitation by non contrast imaging, no mass or inflammation. Spleen: Taking into account limitation by non contrast imaging, no mass or enlargement. Adrenals/Urinary Tract: No renal or ureteral obstructing stone or hydronephrosis. Taking into account limitation by non contrast imaging, no renal or adrenal mass. Urinary bladder contents appear dense. Significance/etiology indeterminate. Stomach/Bowel: Diverticula and muscular hypertrophy most notable sigmoid colon consistent with diverticulosis without CT evidence of diverticulitis. Portions of colon are under distended and evaluation limited although no extraluminal bowel inflammatory process, free fluid or free air noted. No inflammation surrounds the appendix. Moderate-size hiatal hernia. Vascular/Lymphatic: Aortic and iliac artery calcifications without aneurysmal dilation. No adenopathy. Reproductive: Pedunculated 2.4 cm fibroid left fundal region suspected. Left ovary top-normal in size. Other: No bowel containing hernia. Musculoskeletal: Degenerative changes lumbar spine most notable L3-4 through L5-S1. IMPRESSION: No renal or ureteral obstructing stone or hydronephrosis. Prominent size gallbladder containing 2 cm gallstone without CT evidence  of inflammation however if this is of concern, ultrasound recommended. Sigmoid diverticulosis. Portions of colon are under distended and evaluation limited although no extraluminal bowel inflammatory process, free fluid or free air noted. No inflammation surrounds the appendix. Moderate-size hiatal hernia. Urinary bladder contents appear dense. Significance/etiology indeterminate. 2.4 cm left fundal fibroid suspected. Top-normal size of left ovary. Degenerative changes lower lumbar spine. Electronically Signed   By: Genia Del M.D.   On: 12/16/2016 06:31   US Abdomen Limited Ruq  Result Date: 12/16/2016 CLINICAL DATA:  Right upper quadrant pain. EXAM: US ABDOMEN LIMITED - RIGHT UPPER QUADRANT COMPARISON:  CT scan 12/16/2016. FINDINGS: Gallbladder: Gallbladder is distended. 9 mm mobile shadowing stone is identified in the lumen. The gallbladder wall appears thickened and is striated in some areas suggesting gallbladder wall edema. Gallbladder wall thickness measures up to 9 mm. The sonographer reports no sonographic Murphy sign, but patient has apparently received pain medication prior to this study which makes assessment unreliable. Common bile duct: Diameter: 6 mm Liver: No focal abnormality. IMPRESSION: 1. Cholelithiasis with gallbladder wall thickening and apparent gallbladder wall edema. Ultrasound imaging features are highly suspicious for acute cholecystitis. If clinical exam is equivocal, nuclear scintigraphy may prove helpful to assess for cystic duct obstruction. Electronically Signed   By: Misty Stanley M.D.   On: 12/16/2016 08:36    Lab Data:  CBC:  Recent Labs Lab 12/16/16 0410 12/17/16 0412  WBC 9.2 5.0  HGB 14.8 12.2  HCT 44.9 38.3  MCV 95.3 97.0  PLT 256 [id number]   Basic Metabolic Panel:  Recent Labs Lab 12/16/16 0410 12/17/16 0412  NA 141 140  K 3.7 4.2  CL 106 107  CO2 23 27  GLUCOSE 150* 113*  BUN 11 6  CREATININE 0.83 0.81  CALCIUM 9.5 8.5*   GFR: Estimated  Creatinine Clearance: 62.3 mL/min (by C-G formula based on SCr of 0.81 mg/dL). Liver Function Tests:  Recent Labs Lab 12/16/16 0410 12/17/16 0412  AST 25 134*  ALT 16 142*  ALKPHOS 102 146*  BILITOT 0.8 1.2  PROT 7.1 5.6*  ALBUMIN 4.2 3.2*    Recent Labs Lab 12/16/16 0410  LIPASE 25   No results for input(s): AMMONIA in the last 168 hours. Coagulation Profile:  Recent  Labs Lab 12/16/16 1136  INR 1.16   Cardiac Enzymes:  Recent Labs Lab 12/16/16 1136 12/16/16 1628 12/16/16 2252  TROPONINI <0.03 <0.03 <0.03   BNP (last 3 results) No results for input(s): PROBNP in the last 8760 hours. HbA1C: No results for input(s): HGBA1C in the last 72 hours. CBG: No results for input(s): GLUCAP in the last 168 hours. Lipid Profile: No results for input(s): CHOL, HDL, LDLCALC, TRIG, CHOLHDL, LDLDIRECT in the last 72 hours. Thyroid Function Tests: No results for input(s): TSH, T4TOTAL, FREET4, T3FREE, THYROIDAB in the last 72 hours. Anemia Panel: No results for input(s): VITAMINB12, FOLATE, FERRITIN, TIBC, IRON, RETICCTPCT in the last 72 hours. Urine analysis:    Component Value Date/Time   COLORURINE YELLOW 12/16/2016 0457   APPEARANCEUR HAZY (A) 12/16/2016 0457   LABSPEC 1.024 12/16/2016 0457   PHURINE 5.0 12/16/2016 0457   GLUCOSEU NEGATIVE 12/16/2016 0457   HGBUR SMALL (A) 12/16/2016 0457   BILIRUBINUR NEGATIVE 12/16/2016 [place] 12/16/2016 0457   PROTEINUR NEGATIVE 12/16/2016 0457   NITRITE NEGATIVE 12/16/2016 0457   LEUKOCYTESUR SMALL (A) 12/16/2016 0457     Tarshia Kot M.D. [facility] 12/17/2016, 2:10 PM  Pager: [phone] Between 7am to 7pm - call Pager - [phone]  After 7pm go to [email or web address] - password [email or web address]  Call night coverage person covering after 7pm

## 2016-12-17 NOTE — Transfer of Care (Signed)
Immediate Anesthesia Transfer of Care Note  Patient: Melinda Lucas  Procedure(s) Performed: Procedure(s): LAPAROSCOPIC CHOLECYSTECTOMY (N/A)  Patient Location: PACU  Anesthesia Type:General  Level of Consciousness: awake, oriented and patient cooperative  Airway & Oxygen Therapy: Patient Spontanous Breathing and Patient connected to nasal cannula oxygen  Post-op Assessment: Report given to RN, Post -op Vital signs reviewed and stable and Patient moving all extremities  Post vital signs: Reviewed and stable  Last Vitals:  Vitals:   12/17/16 0606 12/17/16 1007  BP: (!) 108/54 (!) 160/69  Pulse: 79   Resp: 18   Temp: 37 C 36.3 C    Last Pain:  Vitals:   12/17/16 1007  TempSrc:   PainSc: 0-No pain         Complications: No apparent anesthesia complications

## 2016-12-17 NOTE — Anesthesia Procedure Notes (Signed)
Procedure Name: Intubation Date/Time: 12/17/2016 9:23 AM Performed by: Melina Copa, Ethan Clayburn R Pre-anesthesia Checklist: Patient identified, Emergency Drugs available, Suction available and Patient being monitored Patient Re-evaluated:Patient Re-evaluated prior to inductionOxygen Delivery Method: Circle System Utilized Preoxygenation: Pre-oxygenation with 100% oxygen Intubation Type: IV induction Ventilation: Mask ventilation without difficulty Laryngoscope Size: Mac and 3 Grade View: Grade I Tube type: Oral Tube size: 7.5 mm Number of attempts: 1 Airway Equipment and Method: Stylet Placement Confirmation: ETT inserted through vocal cords under direct vision,  positive ETCO2 and breath sounds checked- equal and bilateral Secured at: 19 cm Tube secured with: Tape Dental Injury: Teeth and Oropharynx as per pre-operative assessment

## 2016-12-17 NOTE — Anesthesia Postprocedure Evaluation (Addendum)
Anesthesia Post Note  Patient: Melinda Lucas  Procedure(s) Performed: Procedure(s) (LRB): LAPAROSCOPIC CHOLECYSTECTOMY (N/A)  Patient location during evaluation: PACU Anesthesia Type: General Level of consciousness: awake and sedated Pain management: pain level controlled Vital Signs Assessment: post-procedure vital signs reviewed and stable Respiratory status: spontaneous breathing, nonlabored ventilation, respiratory function stable and patient connected to nasal cannula oxygen Cardiovascular status: blood pressure returned to baseline and stable Postop Assessment: no signs of nausea or vomiting Anesthetic complications: no       Last Vitals:  Vitals:   12/17/16 1045 12/17/16 1109  BP: (!) 135/56 (!) 131/57  Pulse: 72 93  Resp: (!) 26 20  Temp: 36.2 C 36.9 C    Last Pain:  Vitals:   12/17/16 1109  TempSrc: Oral  PainSc:                  Lanice Folden,JAMES TERRILL

## 2016-12-17 NOTE — Consult Note (Signed)
[facility] CM Primary Care Navigator  12/17/2016  Melinda Lucas 08-07-1941 [id number]  Met with patient at the bedside to identify possible discharge needs. Patient reports having constant abdominal pain that had led to this admission/surgery.  Patient endorses Dr. Marton Redwood with [facility] as her primary care provider.    Patient shared using [facility] in [place] to obtain medications without any problem.   Patient reports that she manages her medications at home using "pill box" system weekly.   She mentioned that she drives prior to admission/ surgery, however, husband Ky Barban) will be providing transportation to her doctors' appointments after discharge.  Her husband will be the primary caregiver at home as stated.   Patient hopes to discharge home with husband when stable.  Patient voiced understanding to call primary care provider's office when she returns home,  for a post discharge follow-up appointment within a week or sooner if needs arise. Patient letter (with PCP's contact number) was provided as a reminder.  Patient denies any health management needs or concerns at this time.  For additional questions please contact:  Edwena Felty A. Shaia Porath, BSN, RN-BC [facility] PRIMARY CARE Navigator Cell: [phone]

## 2016-12-17 NOTE — Anesthesia Preprocedure Evaluation (Signed)
Anesthesia Evaluation  Patient identified by MRN, date of birth, ID band Patient awake    Reviewed: Allergy & Precautions, NPO status , Patient's Chart, lab work & pertinent test results  Airway Mallampati: II  TM Distance: >3 FB Neck ROM: Full    Dental  (+) Teeth Intact   Pulmonary sleep apnea , former smoker,    breath sounds clear to auscultation       Cardiovascular hypertension, + dysrhythmias  Rhythm:Regular Rate:Normal     Neuro/Psych    GI/Hepatic Neg liver ROS, GERD  ,  Endo/Other  negative endocrine ROS  Renal/GU negative Renal ROS     Musculoskeletal  (+) Arthritis ,   Abdominal (+) + obese,   Peds  Hematology   Anesthesia Other Findings   Reproductive/Obstetrics                             Anesthesia Physical Anesthesia Plan  ASA: III  Anesthesia Plan: General   Post-op Pain Management:    Induction: Intravenous  Airway Management Planned: Oral ETT  Additional Equipment:   Intra-op Plan:   Post-operative Plan: Extubation in OR  Informed Consent: I have reviewed the patients History and Physical, chart, labs and discussed the procedure including the risks, benefits and alternatives for the proposed anesthesia with the patient or authorized representative who has indicated his/her understanding and acceptance.   Dental advisory given  Plan Discussed with:   Anesthesia Plan Comments:         Anesthesia Quick Evaluation

## 2016-12-17 NOTE — Progress Notes (Signed)
Patient has home CPAP and places self on when ready. RT will continue to monitor.

## 2016-12-17 NOTE — Progress Notes (Signed)
Patient ID: Melinda Lucas, female   DOB: 1941-02-19, 76 y.o.   MRN: [id number]   Pre Procedure note for inpatients:   Melinda Lucas has been scheduled for Procedure(s): LAPAROSCOPIC CHOLECYSTECTOMY (N/A) today. The various methods of treatment have been discussed with the patient. After consideration of the risks, benefits and treatment options the patient has consented to the planned procedure.   The patient has been seen and labs reviewed. There are no changes in the patient's condition to prevent proceeding with the planned procedure today.  Recent labs:  Lab Results  Component Value Date   WBC 5.0 12/17/2016   HGB 12.2 12/17/2016   HCT 38.3 12/17/2016   PLT 210 12/17/2016   GLUCOSE 113 (H) 12/17/2016   ALT 142 (H) 12/17/2016   AST 134 (H) 12/17/2016   NA 140 12/17/2016   K 4.2 12/17/2016   CL 107 12/17/2016   CREATININE 0.81 12/17/2016   BUN 6 12/17/2016   CO2 27 12/17/2016   TSH 2.491 07/14/2015   INR 1.16 12/16/2016    Servando Kyllonen A, MD 12/17/2016 8:45 AM

## 2016-12-17 NOTE — Progress Notes (Signed)
Patient returned back to 6n19, alert and oriented, slightly drowsy, in mild pain. She is hooked back to telemetry, IV intact, on 2L [place]. Family at bedside, no complaints right now, will continue to monitor.

## 2016-12-17 NOTE — Op Note (Signed)

## 2016-12-17 NOTE — Progress Notes (Signed)
Patient going for surgery now, called report to short stay, completed pre-op checklist. Per short stay, remove telemetry. Family at bedside and are aware of transfer to surgery.

## 2016-12-17 NOTE — Progress Notes (Signed)
Patient back in [phone] and hooked up to telemetry. VSS

## 2016-12-18 ENCOUNTER — Encounter (HOSPITAL_COMMUNITY): Payer: Self-pay | Admitting: Surgery

## 2016-12-18 LAB — COMPREHENSIVE METABOLIC PANEL
ALT: 100 U/L — AB (ref 14–54)
ANION GAP: 5 (ref 5–15)
AST: 74 U/L — ABNORMAL HIGH (ref 15–41)
Albumin: 3.2 g/dL — ABNORMAL LOW (ref 3.5–5.0)
Alkaline Phosphatase: 135 U/L — ABNORMAL HIGH (ref 38–126)
BUN: 7 mg/dL (ref 6–20)
CHLORIDE: 111 mmol/L (ref 101–111)
CO2: 25 mmol/L (ref 22–32)
Calcium: 8.2 mg/dL — ABNORMAL LOW (ref 8.9–10.3)
Creatinine, Ser: 0.82 mg/dL (ref 0.44–1.00)
GFR calc non Af Amer: >60 mL/min (ref >60)
Glucose, Bld: 128 mg/dL — ABNORMAL HIGH (ref 65–99)
POTASSIUM: 4.3 mmol/L (ref 3.5–5.1)
Sodium: 141 mmol/L (ref 135–145)
Total Bilirubin: 1.1 mg/dL (ref 0.3–1.2)
Total Protein: 5.7 g/dL — ABNORMAL LOW (ref 6.5–8.1)

## 2016-12-18 LAB — CBC
HCT: 38.6 % (ref 36.0–46.0)
HEMOGLOBIN: 12 g/dL (ref 12.0–15.0)
MCH: 30.5 pg (ref 26.0–34.0)
MCHC: 31.1 g/dL (ref 30.0–36.0)
MCV: 98.2 fL (ref 78.0–100.0)
Platelets: 192 10*3/uL (ref 150–400)
RBC: 3.93 MIL/uL (ref 3.87–5.11)
RDW: 13.6 % (ref 11.5–15.5)
WBC: 7 10*3/uL (ref 4.0–10.5)

## 2016-12-18 MED ORDER — HYDROCODONE-ACETAMINOPHEN 5-325 MG PO TABS: 1.0000 | ORAL_TABLET | ORAL | 0 refills | Status: DC | PRN

## 2016-12-18 MED ORDER — POLYETHYLENE GLYCOL 3350 17 G PO PACK
17.0000 g | PACK | Freq: Every day | ORAL | Status: DC
  Administered 2016-12-18: 17 g via ORAL
  Filled 2016-12-18: qty 1

## 2016-12-18 MED ORDER — ONDANSETRON 4 MG PO TBDP
4.0000 mg | ORAL_TABLET | Freq: Three times a day (TID) | ORAL | 0 refills | Status: DC | PRN
Start: 2016-12-18 — End: 2017-02-09

## 2016-12-18 NOTE — Progress Notes (Signed)
Patient ID: Melinda Lucas, female   DOB: October 28, 1940, 76 y.o.   MRN: [id number]  [facility] Surgery Progress Note  1 Day Post-Op  Subjective: Abdomen sore but otherwise feels well this morning. Had a headache last night and did not sleep well. She is tolerating diet, drinking fluids, passing flatus, and ambulating with no difficulties. Wants to go home today.  Objective: Vital signs in last 24 hours: Temp:  [97.2 F (36.2 C)-99.6 F (37.6 C)] 99.6 F (37.6 C) (03/01 0519) Pulse Rate:  [72-93] 80 (03/01 0519) Resp:  [20-26] 20 (03/01 0519) BP: (109-160)/(48-69) 119/48 (03/01 0519) SpO2:  [93 %-96 %] 96 % (03/01 0519) Last BM Date: 12/17/16  Intake/Output from previous day: 02/28 0701 - 03/01 0700 In: 3249.2 [P.O.:980; I.V.:2269.2] Out: 2270 [Urine:2250; Blood:20] Intake/Output this shift: No intake/output data recorded.  PE: Gen:  Alert, NAD, pleasant Pulm:  Effort normal Abd: Soft, ND, +BS, appropriately tender, no HSM, incisions C/D/I Ext:  No erythema, edema, or tenderness   Lab Results:   Recent Labs  12/17/16 0412 12/18/16 0444  WBC 5.0 7.0  HGB 12.2 12.0  HCT 38.3 38.6  PLT 210 192   BMET  Recent Labs  12/17/16 0412 12/18/16 0444  NA 140 141  K 4.2 4.3  CL 107 111  CO2 27 25  GLUCOSE 113* 128*  BUN 6 7  CREATININE 0.81 0.82  CALCIUM 8.5* 8.2*   PT/INR  Recent Labs  12/16/16 1136  LABPROT 14.9  INR 1.16   CMP     Component Value Date/Time   NA 141 12/18/2016 0444   K 4.3 12/18/2016 0444   CL 111 12/18/2016 0444   CO2 25 12/18/2016 0444   GLUCOSE 128 (H) 12/18/2016 0444   BUN 7 12/18/2016 0444   CREATININE 0.82 12/18/2016 0444   CALCIUM 8.2 (L) 12/18/2016 0444   PROT 5.7 (L) 12/18/2016 0444   ALBUMIN 3.2 (L) 12/18/2016 0444   AST 74 (H) 12/18/2016 0444   ALT 100 (H) 12/18/2016 0444   ALKPHOS 135 (H) 12/18/2016 0444   BILITOT 1.1 12/18/2016 0444   GFRNONAA >60 12/18/2016 0444   GFRAA >60 12/18/2016 0444   Lipase      Component Value Date/Time   LIPASE 25 12/16/2016 0410       Studies/Results: Dg Chest 2 View  Result Date: 12/16/2016 CLINICAL DATA:  Chest pain and abdominal pain EXAM: CHEST  2 VIEW COMPARISON:  07/14/2015 FINDINGS: Cardiac shadow is stable. Aortic calcifications are again seen. Lungs are well aerated bilaterally with mild stable interstitial changes. No focal infiltrate or sizable effusion is noted. No acute bony abnormality is seen. IMPRESSION: No active cardiopulmonary disease. Electronically Signed   By: Inez Catalina M.D.   On: 12/16/2016 07:57   US Abdomen Limited Ruq  Result Date: 12/16/2016 CLINICAL DATA:  Right upper quadrant pain. EXAM: US ABDOMEN LIMITED - RIGHT UPPER QUADRANT COMPARISON:  CT scan 12/16/2016. FINDINGS: Gallbladder: Gallbladder is distended. 9 mm mobile shadowing stone is identified in the lumen. The gallbladder wall appears thickened and is striated in some areas suggesting gallbladder wall edema. Gallbladder wall thickness measures up to 9 mm. The sonographer reports no sonographic Murphy sign, but patient has apparently received pain medication prior to this study which makes assessment unreliable. Common bile duct: Diameter: 6 mm Liver: No focal abnormality. IMPRESSION: 1. Cholelithiasis with gallbladder wall thickening and apparent gallbladder wall edema. Ultrasound imaging features are highly suspicious for acute cholecystitis. If clinical exam is equivocal, nuclear scintigraphy  may prove helpful to assess for cystic duct obstruction. Electronically Signed   By: Misty Stanley M.D.   On: 12/16/2016 08:36    Anti-infectives: Anti-infectives    Start     Dose/Rate Route Frequency Ordered Stop   12/17/16 0900  cefTRIAXone (ROCEPHIN) 2 g in dextrose 5 % 50 mL IVPB  Status:  Discontinued     2 g 100 mL/hr over 30 Minutes Intravenous Every 24 hours 12/16/16 1013 12/17/16 1150   12/16/16 0900  cefTRIAXone (ROCEPHIN) 2 g in dextrose 5 % 50 mL IVPB     2 g 100 mL/hr  over 30 Minutes Intravenous  Once 12/16/16 0854 12/16/16 1000       Assessment/Plan Calculus of gallbladder with acute cholecystitis, without mention of obstruction S/p lap chole 2/28 Dr. Ninfa Linden - POD 1  Atrial fibrillation s/p ablation 2017 - on eliquis GERD OSA on CPAP  ID - rocephin perioperative VTE - SCDs, ok to restart eliquis FEN - soft diet advance as tolerated  Plan - Patient is ready for discharge from surgical standpoint. Ok to restart eliquis today. She does not need to go home on antibiotics. She will follow-up in clinic in 3 weeks. norco rx on chart.   LOS: 2 days    Jerrye Beavers , [facility] Surgery 12/18/2016, 7:52 AM Pager: [phone] Consults: [phone] Mon-Fri 7:00 am-4:30 pm Sat-Sun 7:00 am-11:30 am

## 2016-12-18 NOTE — Progress Notes (Signed)
IV removed. Discharge paperwork given to the patient. Prescriptions given. No questions verbalized. Patient is ready for discharge.

## 2016-12-18 NOTE — Discharge Instructions (Signed)
°LAPAROSCOPIC SURGERY: POST OP INSTRUCTIONS  °1. DIET: Follow a light bland diet the first 24 hours after arrival home, such as soup, liquids, crackers, etc. Be sure to include lots of fluids daily. Avoid fast food or heavy meals as your are more likely to get nauseated. Eat a low fat the next few days after surgery.  °2. Take your usually prescribed home medications unless otherwise directed. °3. PAIN CONTROL:  °1. Pain is best controlled by a usual combination of three different methods TOGETHER:  °1. Ice/Heat °2. Over the counter pain medication °3. Prescription pain medication °2. Most patients will experience some swelling and bruising around the incisions. Ice packs or heating pads (30-60 minutes up to 6 times a day) will help. Use ice for the first few days to help decrease swelling and bruising, then switch to heat to help relax tight/sore spots and speed recovery. Some people prefer to use ice alone, heat alone, alternating between ice & heat. Experiment to what works for you. Swelling and bruising can take several weeks to resolve.  °3. It is helpful to take an over-the-counter pain medication regularly for the first few weeks. Choose one of the following that works best for you:  °1. Naproxen (Aleve, etc) Two 220mg tabs twice a day °2. Ibuprofen (Advil, etc) Three 200mg tabs four times a day (every meal & bedtime) °3. Acetaminophen (Tylenol, etc) 500-650mg four times a day (every meal & bedtime) °4. A prescription for pain medication (such as oxycodone, hydrocodone, etc) should be given to you upon discharge. Take your pain medication as prescribed.  °1. If you are having problems/concerns with the prescription medicine (does not control pain, nausea, vomiting, rash, itching, etc), please call us [phone] to see if we need to switch you to a different pain medicine that will work better for you and/or control your side effect better. °2. If you need a refill on your pain medication, please contact  your pharmacy. They will contact our office to request authorization. Prescriptions will not be filled after 5 pm or on week-ends. °4. Avoid getting constipated. Between the surgery and the pain medications, it is common to experience some constipation. Increasing fluid intake and taking a fiber supplement (such as Metamucil, Citrucel, FiberCon, MiraLax, etc) 1-2 times a day regularly will usually help prevent this problem from occurring. A mild laxative (prune juice, Milk of Magnesia, MiraLax, etc) should be taken according to package directions if there are no bowel movements after 48 hours.  °5. Watch out for diarrhea. If you have many loose bowel movements, simplify your diet to bland foods & liquids for a few days. Stop any stool softeners and decrease your fiber supplement. Switching to mild anti-diarrheal medications (Kayopectate, Pepto Bismol) can help. If this worsens or does not improve, please call us. °6. Wash / shower every day. You may shower over the dressings as they are waterproof. Continue to shower over incision(s) after the dressing is off. If there is glue over the incisions try not to pick it off, let it fall off naturally. °7. Remove your waterproof bandages 5 days after surgery. You may leave the incision open to air. You may replace a dressing/Band-Aid to cover the incision for comfort if you wish.  °8. ACTIVITIES as tolerated:  °1. You may resume regular (light) daily activities beginning the next day--such as daily self-care, walking, climbing stairs--gradually increasing activities as tolerated. If you can walk 30 minutes without difficulty, it is safe to try more intense activity   such as jogging, treadmill, bicycling, low-impact aerobics, swimming, etc. °2. Save the most intensive and strenuous activity for last such as sit-ups, heavy lifting, contact sports, etc Refrain from any heavy lifting or straining until you are off narcotics for pain control. For the first 2-3 weeks do not lift  over 10-15lb.  °3. DO NOT PUSH THROUGH PAIN. Let pain be your guide: If it hurts to do something, don't do it. Pain is your body warning you to avoid that activity for another week until the pain goes down. °4. You may drive when you are no longer taking prescription pain medication, you can comfortably wear a seatbelt, and you can safely maneuver your car and apply brakes. °5. You may have sexual intercourse when it is comfortable.  °9. FOLLOW UP in our office  °1. Please call [facility] at [phone] to set up an appointment to see your surgeon in the office for a follow-up appointment approximately 2-3 weeks after your surgery. °2. Make sure that you call for this appointment the day you arrive home to insure a convenient appointment time. °     10. IF YOU HAVE DISABILITY OR FAMILY LEAVE FORMS, BRING THEM TO THE               OFFICE FOR PROCESSING.  ° °WHEN TO CALL US [phone]:  °1. Poor pain control °2. Reactions / problems with new medications (rash/itching, nausea, etc)  °3. Fever over 101.5 F (38.5 C) °4. Inability to urinate °5. Nausea and/or vomiting °6. Worsening swelling or bruising °7. Continued bleeding from incision. °8. Increased pain, redness, or drainage from the incision ° °The clinic staff is available to answer your questions during regular business hours (8:30am-5pm). Please don’t hesitate to call and ask to speak to one of our nurses for clinical concerns.  °If you have a medical emergency, go to the nearest emergency room or call 911.  °A surgeon from [facility] is always on call at the hospitals  ° °[facility]  °[address], [address], [place], [place] [address] ?  °MAIN: [phone] ? TOLL FREE: [phone] ?  °FAX [phone]  °[email or web address] ° ° °

## 2016-12-18 NOTE — Discharge Summary (Signed)
Physician Discharge Summary   Patient ID: Melinda Lucas MRN: [id number] DOB/AGE: 06/04/1941 76 y.o.  Admit date: 12/16/2016 Discharge date: 12/18/2016  Primary Care Physician:  Marton Redwood, MD  Discharge Diagnoses:    . Acute cholecystitis . Hyperlipidemia . Paroxysmal atrial fibrillation (HCC)   Consults:  Gen. surgery  Recommendations for Outpatient Follow-up:  1. Please repeat CBC/BMET at next visit   DIET: Heart healthy diet    Allergies:  No Known Allergies   DISCHARGE MEDICATIONS: Current Discharge Medication List    START taking these medications   Details  HYDROcodone-acetaminophen (NORCO/VICODIN) 5-325 MG tablet Take 1 tablet by mouth every 4 (four) hours as needed for moderate pain. Qty: 15 tablet, Refills: 0    ondansetron (ZOFRAN ODT) 4 MG disintegrating tablet Take 1 tablet (4 mg total) by mouth every 8 (eight) hours as needed for nausea or vomiting. Qty: 20 tablet, Refills: 0      CONTINUE these medications which have NOT CHANGED   Details  atorvastatin (LIPITOR) 20 MG tablet Take 20 mg by mouth daily.    ELIQUIS 5 MG TABS tablet Take 5 mg by mouth 2 (two) times daily. Refills: 12    PRESCRIPTION MEDICATION Apply 1 application topically daily. Cream for face, filled at [facility]    tretinoin (RETIN-A) 0.025 % cream Apply 1 application topically at bedtime.    TURMERIC PO Take 1 capsule by mouth daily.     Vitamin D, Ergocalciferol, (DRISDOL) 50000 UNITS CAPS Take 50,000 Units by mouth every Monday.          Brief H and P: For complete details please refer to admission H and P, but in brief Melinda Lucas a 76 y.o.femalewith medical history significant of Afib, GERD, Melanoma, Obesity, OSA, osteopenia, presenting with Abdominal pain. Abdominal pain was initially described as a nausea feeling in the lower abdomen across the right and left sides with mild crampy pain. Prominent size gallbladder containing 2 cm gallstone without CT evidence  of inflammation   Hospital Course:  Acute cholecystitis: - CT abdomen showed 2 cm gallstone, abdominal ultrasound showed cholelithiasis with gallbladder wall thickening and the gallbladder wall edema suspicious for acute cholecystitis - Gen. surgery was consulted and patient underwent laparoscopic cholecystectomy  - Patient is not tolerating solid diet, doing well. - Patient is cleared to be discharged home by general surgery  Paroxysmal atrial fibrillation -  h/o Ablation on 07/2015. Last dose of Eliquis 21:00 on 12/15/16. EKG showing sinus w/o evidence of ACS. pts complained of CP likely referred from cholecystitis. Last TEE showing EF [id number] w/ nml diastolic function. BNP nml. Poc Trop nml. - Troponins 4 negative - Patient was seen for pre-op by cardiology. She can resume eliquis at home.   HLD: - continue lipitor  HTN: intermittently elevated likely from pain. - hydralazine prn  OSA: - CPAP at night.   Day of Discharge BP (!) 109/49 (BP Location: Right Arm)   Pulse 75   Temp 98.4 F (36.9 C) (Oral)   Resp 20   Ht 5\' 5"  (1.651 m)   Wt 83.9 kg (185 lb)   SpO2 92%   BMI 30.79 kg/m   Physical Exam: General: Alert and awake oriented x3 not in any acute distress. HEENT: anicteric sclera, pupils reactive to light and accommodation CVS: S1-S2 clear no murmur rubs or gallops Chest: clear to auscultation bilaterally, no wheezing rales or rhonchi Abdomen: soft nontender, nondistended, normal bowel sounds, incisions CDI Extremities: no cyanosis, clubbing or edema noted  bilaterally Neuro: Cranial nerves II-XII intact, no focal neurological deficits   The results of significant diagnostics from this hospitalization (including imaging, microbiology, ancillary and laboratory) are listed below for reference.    LAB RESULTS: Basic Metabolic Panel:  Recent Labs Lab 12/17/16 0412 12/18/16 0444  NA 140 141  K 4.2 4.3  CL 107 111  CO2 27 25  GLUCOSE 113* 128*  BUN 6 7   CREATININE 0.81 0.82  CALCIUM 8.5* 8.2*   Liver Function Tests:  Recent Labs Lab 12/17/16 0412 12/18/16 0444  AST 134* 74*  ALT 142* 100*  ALKPHOS 146* 135*  BILITOT 1.2 1.1  PROT 5.6* 5.7*  ALBUMIN 3.2* 3.2*    Recent Labs Lab 12/16/16 0410  LIPASE 25   No results for input(s): AMMONIA in the last 168 hours. CBC:  Recent Labs Lab 12/17/16 0412 12/18/16 0444  WBC 5.0 7.0  HGB 12.2 12.0  HCT 38.3 38.6  MCV 97.0 98.2  PLT 210 192   Cardiac Enzymes:  Recent Labs Lab 12/16/16 1628 12/16/16 2252  TROPONINI <0.03 <0.03   BNP: Invalid input(s): POCBNP CBG: No results for input(s): GLUCAP in the last 168 hours.  Significant Diagnostic Studies:  Dg Chest 2 View  Result Date: 12/16/2016 CLINICAL DATA:  Chest pain and abdominal pain EXAM: CHEST  2 VIEW COMPARISON:  07/14/2015 FINDINGS: Cardiac shadow is stable. Aortic calcifications are again seen. Lungs are well aerated bilaterally with mild stable interstitial changes. No focal infiltrate or sizable effusion is noted. No acute bony abnormality is seen. IMPRESSION: No active cardiopulmonary disease. Electronically Signed   By: Inez Catalina M.D.   On: 12/16/2016 07:57   Ct Renal Stone Study  Result Date: 12/16/2016 CLINICAL DATA:  76 year old female with right-sided chest and back pain with nausea and vomiting since this morning. History kidney stones. Initial encounter. EXAM: CT ABDOMEN AND PELVIS WITHOUT CONTRAST TECHNIQUE: Multidetector CT imaging of the abdomen and pelvis was performed following the standard protocol without IV contrast. COMPARISON:  01/06/2016 CT. FINDINGS: Lower chest: Tiny calcified granuloma medial left lung base. Mild scarring/ atelectasis lung bases. Heart size within normal limits. Mitral valve calcifications. Hepatobiliary: Taking into account limitation by non contrast imaging, no focal worrisome hepatic lesion. Prominent size gallbladder containing 2 cm gallstone without CT evidence of  inflammation however if this is of concern, ultrasound recommended. Pancreas: Taking into account limitation by non contrast imaging, no mass or inflammation. Spleen: Taking into account limitation by non contrast imaging, no mass or enlargement. Adrenals/Urinary Tract: No renal or ureteral obstructing stone or hydronephrosis. Taking into account limitation by non contrast imaging, no renal or adrenal mass. Urinary bladder contents appear dense. Significance/etiology indeterminate. Stomach/Bowel: Diverticula and muscular hypertrophy most notable sigmoid colon consistent with diverticulosis without CT evidence of diverticulitis. Portions of colon are under distended and evaluation limited although no extraluminal bowel inflammatory process, free fluid or free air noted. No inflammation surrounds the appendix. Moderate-size hiatal hernia. Vascular/Lymphatic: Aortic and iliac artery calcifications without aneurysmal dilation. No adenopathy. Reproductive: Pedunculated 2.4 cm fibroid left fundal region suspected. Left ovary top-normal in size. Other: No bowel containing hernia. Musculoskeletal: Degenerative changes lumbar spine most notable L3-4 through L5-S1. IMPRESSION: No renal or ureteral obstructing stone or hydronephrosis. Prominent size gallbladder containing 2 cm gallstone without CT evidence of inflammation however if this is of concern, ultrasound recommended. Sigmoid diverticulosis. Portions of colon are under distended and evaluation limited although no extraluminal bowel inflammatory process, free fluid or free air noted. No inflammation  surrounds the appendix. Moderate-size hiatal hernia. Urinary bladder contents appear dense. Significance/etiology indeterminate. 2.4 cm left fundal fibroid suspected. Top-normal size of left ovary. Degenerative changes lower lumbar spine. Electronically Signed   By: Genia Del M.D.   On: 12/16/2016 06:31   US Abdomen Limited Ruq  Result Date: 12/16/2016 CLINICAL DATA:   Right upper quadrant pain. EXAM: US ABDOMEN LIMITED - RIGHT UPPER QUADRANT COMPARISON:  CT scan 12/16/2016. FINDINGS: Gallbladder: Gallbladder is distended. 9 mm mobile shadowing stone is identified in the lumen. The gallbladder wall appears thickened and is striated in some areas suggesting gallbladder wall edema. Gallbladder wall thickness measures up to 9 mm. The sonographer reports no sonographic Murphy sign, but patient has apparently received pain medication prior to this study which makes assessment unreliable. Common bile duct: Diameter: 6 mm Liver: No focal abnormality. IMPRESSION: 1. Cholelithiasis with gallbladder wall thickening and apparent gallbladder wall edema. Ultrasound imaging features are highly suspicious for acute cholecystitis. If clinical exam is equivocal, nuclear scintigraphy may prove helpful to assess for cystic duct obstruction. Electronically Signed   By: Misty Stanley M.D.   On: 12/16/2016 08:36    2D ECHO:   Disposition and Follow-up: Discharge Instructions    Diet - low sodium heart healthy    Complete by:  As directed    Increase activity slowly    Complete by:  As directed        DISPOSITION: home    [facility]. Go on 01/13/2017.   Specialty:  General Surgery Why:  Your appointment is 01/13/17 @ 10:45AM. Please arrive 30 minute prior to your appointment to check in and fill out necessary paperwork. Contact information: [address] [place] [phone]       Marton Redwood, MD. Schedule an appointment as soon as possible for a visit in 2 week(s).   Specialty:  Internal Medicine Contact information: [address] [place] [address] [phone]            Time spent on Discharge: 64mins   Signed:   RAI,RIPUDEEP M.D. [facility] 12/18/2016, 1:54 PM Pager: [phone]

## 2016-12-24 DIAGNOSIS — E784 Other hyperlipidemia: Secondary | ICD-10-CM | POA: Diagnosis not present

## 2016-12-24 DIAGNOSIS — R945 Abnormal results of liver function studies: Secondary | ICD-10-CM | POA: Diagnosis not present

## 2017-01-16 DIAGNOSIS — E784 Other hyperlipidemia: Secondary | ICD-10-CM | POA: Diagnosis not present

## 2017-01-16 DIAGNOSIS — E559 Vitamin D deficiency, unspecified: Secondary | ICD-10-CM | POA: Diagnosis not present

## 2017-01-16 DIAGNOSIS — Z7901 Long term (current) use of anticoagulants: Secondary | ICD-10-CM | POA: Diagnosis not present

## 2017-01-16 DIAGNOSIS — I480 Paroxysmal atrial fibrillation: Secondary | ICD-10-CM | POA: Diagnosis not present

## 2017-01-16 DIAGNOSIS — Z6833 Body mass index (BMI) 33.0-33.9, adult: Secondary | ICD-10-CM | POA: Diagnosis not present

## 2017-01-16 DIAGNOSIS — Z9049 Acquired absence of other specified parts of digestive tract: Secondary | ICD-10-CM | POA: Diagnosis not present

## 2017-01-16 DIAGNOSIS — R945 Abnormal results of liver function studies: Secondary | ICD-10-CM | POA: Diagnosis not present

## 2017-01-22 ENCOUNTER — Encounter: Payer: Self-pay | Admitting: Internal Medicine

## 2017-02-09 ENCOUNTER — Encounter: Payer: Self-pay | Admitting: Internal Medicine

## 2017-02-09 ENCOUNTER — Ambulatory Visit (INDEPENDENT_AMBULATORY_CARE_PROVIDER_SITE_OTHER): Payer: Medicare Other | Admitting: Internal Medicine

## 2017-02-09 VITALS — BP 128/78 | HR 75 | Ht 64.5 in | Wt 196.2 lb

## 2017-02-09 DIAGNOSIS — I480 Paroxysmal atrial fibrillation: Secondary | ICD-10-CM | POA: Diagnosis not present

## 2017-02-09 DIAGNOSIS — I495 Sick sinus syndrome: Secondary | ICD-10-CM

## 2017-02-09 DIAGNOSIS — E669 Obesity, unspecified: Secondary | ICD-10-CM | POA: Diagnosis not present

## 2017-02-09 DIAGNOSIS — N201 Calculus of ureter: Secondary | ICD-10-CM | POA: Diagnosis not present

## 2017-02-09 NOTE — Progress Notes (Signed)
Electrophysiology Office Note  Date:  02/09/2017   ID:  SHEQUILA NEGLIA, DOB Aug 02, 1941, MRN [id number]  PCP:  Marton Redwood, MD  Cardiologist:  Dr. Wynonia Lawman Primary Electrophysiologist: Dr. Rayann Heman    Chief Complaint  Patient presents with  . Atrial Fibrillation    History of Present Illness: Melinda Lucas is a 76 y.o. female who presents today for electrophysiology evaluation.   No symptomatic pauses or afib since her last visit.  She is very pleased with her current state. Today, she denies symptoms of palpitations, chest pain, shortness of breath, orthopnea, PND, lower extremity edema, claudication, dizziness, presyncope, syncope, bleeding, or neurologic sequela. The patient is tolerating medications without difficulties and is otherwise without complaint today.   Past Medical History:  Diagnosis Date  . Arthritis    "a little in my fingers" (12/16/2016)  . Atrial fibrillation ([place])   . GERD (gastroesophageal reflux disease)   . Heart murmur   . History of kidney stones   . Melanoma in situ of lower leg ([place])   . Obesity (BMI 30-39.9)   . OSA on CPAP    "started it summer 2017"   . Osteopenia    Past Surgical History:  Procedure Laterality Date  . CATARACT EXTRACTION W/ INTRAOCULAR LENS  IMPLANT, BILATERAL Bilateral ~ 2014  . CHOLECYSTECTOMY N/A 12/17/2016   Procedure: LAPAROSCOPIC CHOLECYSTECTOMY;  Surgeon: Coralie Keens, MD;  Location: [facility];  Service: General;  Laterality: N/A;  . COLONOSCOPY    . DILATION AND CURETTAGE OF UTERUS  1970's  . ELECTROPHYSIOLOGIC STUDY N/A 08/09/2015   Procedure: Atrial Fibrillation Ablation;  Surgeon: Thompson Grayer, MD;  Location: [facility];  Service: Cardiovascular;  Laterality: N/A;  . FRACTURE SURGERY    . MELANOMA EXCISION Left 2010   leg  . TEE WITHOUT CARDIOVERSION N/A 08/09/2015   Procedure: TRANSESOPHAGEAL ECHOCARDIOGRAM (TEE);  Surgeon: Jerline Pain, MD;  Location: [facility];  Service: Cardiovascular;  Laterality:  N/A;  . TONSILLECTOMY  1946  . TUBAL LIGATION  1970's  . WRIST FRACTURE SURGERY Left 2011     Current Outpatient Prescriptions  Medication Sig Dispense Refill  . atorvastatin (LIPITOR) 20 MG tablet Take 20 mg by mouth daily.    Marland Kitchen ELIQUIS 5 MG TABS tablet Take 5 mg by mouth 2 (two) times daily.  12  . PRESCRIPTION MEDICATION Apply 1 application topically daily. Cream for face, filled at [facility]    . tretinoin (RETIN-A) 0.025 % cream Apply 1 application topically at bedtime.    . TURMERIC PO Take 1 capsule by mouth daily.     . Vitamin D, Ergocalciferol, (DRISDOL) 50000 UNITS CAPS Take 50,000 Units by mouth every Monday.      No current facility-administered medications for this visit.     Allergies:   Patient has no known allergies.   Social History:  The patient  reports that she has quit smoking. Her smoking use included Cigarettes. She has a 7.00 pack-year smoking history. She has never used smokeless tobacco. She reports that she drinks about 8.4 oz of alcohol per week . She reports that she does not use drugs.   Family History:  The patient's  family history includes AAA (abdominal aortic aneurysm) in her father; Colon cancer (age of onset: 13) in her mother; Hypertension in her brother.    ROS:  Please see the history of present illness.   All other systems are reviewed and negative.    PHYSICAL EXAM: VS:  BP 128/78  Pulse 75   Ht 5' 4.5" (1.638 m)   Wt 196 lb 3.2 oz (89 kg)   SpO2 97%   BMI 33.16 kg/m  , BMI Body mass index is 33.16 kg/m. GEN: Well nourished, well developed, in no acute distress  HEENT: normal  Neck: no JVD  Cardiac: RRR;  no edema  Respiratory:  clear to auscultation bilaterally, normal work of breathing GI: soft, nontender, nondistended, + BS MS: no deformity or atrophy  Skin: warm and dry  Neuro:  Strength and sensation are intact Psych: euthymic mood, full affect  EKG:  EKG is ordered today. The ekg ordered today shows SR 75 bpm, otherwise  normal ekg   Recent Labs: 12/16/2016: B Natriuretic Peptide 83.5 12/18/2016: ALT 100; BUN 7; Creatinine, Ser 0.82; Hemoglobin 12.0; Platelets 192; Potassium 4.3; Sodium 141     Wt Readings from Last 3 Encounters:  02/09/17 196 lb 3.2 oz (89 kg)  12/16/16 185 lb (83.9 kg)  08/14/16 191 lb 6.4 oz (86.8 kg)      ASSESSMENT AND PLAN:  1.  PAFib  CHADS2Vasc is 3 on Eliquis  Maintaining sinus rhythm off of AAD therapy No changes today  2. Sick sinus syndrome Post termination pauses resolved post ablation No indication for pacing at this time Resume normal activity  3. Obesity Body mass index is 33.16 kg/m. Regular exercise and weight loss advised  4. OSA Followed by Dr Annamaria Boots    Return to see me in 12 months Follow-up with Dr Wynonia Lawman as scheduled   Signed, Thompson Grayer MD 02/09/2017 4:32 PM     [facility] [address] [place] [address] [phone] (office) [phone] (fax)

## 2017-02-09 NOTE — Patient Instructions (Signed)

## 2017-02-25 ENCOUNTER — Ambulatory Visit (INDEPENDENT_AMBULATORY_CARE_PROVIDER_SITE_OTHER): Payer: Medicare Other | Admitting: Orthopaedic Surgery

## 2017-02-25 ENCOUNTER — Ambulatory Visit (INDEPENDENT_AMBULATORY_CARE_PROVIDER_SITE_OTHER): Payer: Medicare Other

## 2017-02-25 ENCOUNTER — Encounter (INDEPENDENT_AMBULATORY_CARE_PROVIDER_SITE_OTHER): Payer: Self-pay | Admitting: Orthopaedic Surgery

## 2017-02-25 VITALS — Ht 64.5 in | Wt 196.0 lb

## 2017-02-25 DIAGNOSIS — M25511 Pain in right shoulder: Secondary | ICD-10-CM

## 2017-02-25 DIAGNOSIS — M7541 Impingement syndrome of right shoulder: Secondary | ICD-10-CM

## 2017-02-25 MED ORDER — METHYLPREDNISOLONE 4 MG PO TABS: ORAL_TABLET | ORAL | 0 refills | Status: DC

## 2017-02-25 MED ORDER — LIDOCAINE HCL 1 % IJ SOLN
3.0000 mL | INTRAMUSCULAR | Status: AC | PRN
  Administered 2017-02-25: 3 mL

## 2017-02-25 MED ORDER — METHYLPREDNISOLONE ACETATE 40 MG/ML IJ SUSP
40.0000 mg | INTRAMUSCULAR | Status: AC | PRN
  Administered 2017-02-25: 40 mg via INTRA_ARTICULAR

## 2017-02-25 NOTE — Progress Notes (Signed)
Office Visit Note   Patient: Melinda Lucas           Date of Birth: 03-29-1941           MRN: [id number] Visit Date: 02/25/2017              Requested by: Marton Redwood, MD [address] [place],  [address] PCP: Marton Redwood, MD   Assessment & Plan: Visit Diagnoses:  1. Acute pain of right shoulder   2. Impingement syndrome of right shoulder     Plan: She understands my working diagnosis of shoulder impingement syndrome. I showed her shoulder model explaining in detail what this involves. She tolerated the steroid injection well after explaining the risks and benefits of this. She cannot take anti-inflammatories she is on chronic blood thinning medication. I will also send in a six-day steroid taper. We'll see her back in a month to see how she doing overall and I would not hesitate providing one more injection at that point and even considering physical therapy if needed.  Follow-Up Instructions: Return in about 4 weeks (around 03/25/2017).   Orders:  Orders Placed This Encounter  Procedures  . Large Joint Injection/Arthrocentesis  . XR Shoulder Right   Meds ordered this encounter  Medications  . methylPREDNISolone (MEDROL) 4 MG tablet    Sig: Medrol dose pack. Take as instructed    Dispense:  21 tablet    Refill:  0      Procedures: Large Joint Inj Date/Time: 02/25/2017 4:14 PM Performed by: Mcarthur Rossetti Authorized by: Mcarthur Rossetti   Location:  Shoulder Site:  R subacromial bursa Ultrasound Guidance: No   Fluoroscopic Guidance: No   Arthrogram: No   Medications:  3 mL lidocaine 1 %; 40 mg methylPREDNISolone acetate 40 MG/ML     Clinical Data: No additional findings.   Subjective: Chief Complaint  Patient presents with  . Right Shoulder - Pain   The someone I've actually seen in the past but is been a long period time and that was for her wrist. She comes in today with acute right shoulder pain with no obvious injury. She is  76 years old and it bothers her most at night and with overhead activities. She says also reaching behind her's causing a lot of problems. It isn't irritating pain is starting the detrimental effect her sleep as well as her activities of daily living. Again she denies any injuries. She denies any radicular components of her pain or any neck issues. There is no numbness and tingling in her hand she states. HPI  Review of Systems She denies any headache, chest pain, shortness of breath, fever, chills, nausea, vomiting.  Objective: Vital Signs: Ht 5' 4.5" (1.638 m)   Wt 196 lb (88.9 kg)   BMI 33.12 kg/m   Physical Exam She is alert and oriented 3 and in no acute distress Ortho Exam Examination of her right shoulder shows almost full range of motion. Her biggest limitations are with internal rotation with adduction is to the lower lumbar spine which is the mid thoracic spine on her nonpainful left side. Her rotator cuff itself appears to be intact however she does use some of her deltoids to abduct her shoulder but this may be more pain related. She does have a positive Neer and Hawkins signs. Specialty Comments:  No specialty comments available.  Imaging: Xr Shoulder Right  Result Date: 02/25/2017 3 views of her right shoulder AP outlet and axillary view show  well located shoulder with no acute findings. The acromioclavicular joint and glenohumeral joint are well maintained.    PMFS History: Patient Active Problem List   Diagnosis Date Noted  . Impingement syndrome of right shoulder 02/25/2017  . RUQ pain   . Acute cholecystitis 12/16/2016  . OSA (obstructive sleep apnea) 12/16/2016  . Hypertension 12/16/2016  . Sick sinus syndrome ([place]) 08/06/2015  . Obesity (BMI 30-39.9) 07/14/2015  . Paroxysmal atrial fibrillation (HCC)   . Long-term (current) use of anticoagulants   . Aortic arch atherosclerosis ([place])   . Hyperlipidemia    Past Medical History:  Diagnosis Date  . Arthritis     "a little in my fingers" (12/16/2016)  . Atrial fibrillation ([facility])   . GERD (gastroesophageal reflux disease)   . Heart murmur   . History of kidney stones   . Melanoma in situ of lower leg ([facility])   . Obesity (BMI 30-39.9)   . OSA on CPAP    "started it summer 2017"   . Osteopenia     Family History  Problem Relation Age of Onset  . Colon cancer Mother 23  . AAA (abdominal aortic aneurysm) Father   . Hypertension Brother     Past Surgical History:  Procedure Laterality Date  . CATARACT EXTRACTION W/ INTRAOCULAR LENS  IMPLANT, BILATERAL Bilateral ~ 2014  . CHOLECYSTECTOMY N/A 12/17/2016   Procedure: LAPAROSCOPIC CHOLECYSTECTOMY;  Surgeon: Coralie Keens, MD;  Location: [facility];  Service: General;  Laterality: N/A;  . COLONOSCOPY    . DILATION AND CURETTAGE OF UTERUS  1970's  . ELECTROPHYSIOLOGIC STUDY N/A 08/09/2015   Procedure: Atrial Fibrillation Ablation;  Surgeon: Thompson Grayer, MD;  Location: [facility];  Service: Cardiovascular;  Laterality: N/A;  . FRACTURE SURGERY    . MELANOMA EXCISION Left 2010   leg  . TEE WITHOUT CARDIOVERSION N/A 08/09/2015   Procedure: TRANSESOPHAGEAL ECHOCARDIOGRAM (TEE);  Surgeon: Jerline Pain, MD;  Location: [facility];  Service: Cardiovascular;  Laterality: N/A;  . TONSILLECTOMY  1946  . TUBAL LIGATION  1970's  . WRIST FRACTURE SURGERY Left 2011   Social History   Occupational History  . Not on file.   Social History Main Topics  . Smoking status: Former Smoker    Packs/day: 1.00    Years: 7.00    Types: Cigarettes  . Smokeless tobacco: Never Used     Comment: "quit smoking in ~ 1970"  . Alcohol use 8.4 oz/week    7 Shots of liquor, 7 Standard drinks or equivalent per week  . Drug use: No  . Sexual activity: No

## 2017-03-13 DIAGNOSIS — R7301 Impaired fasting glucose: Secondary | ICD-10-CM | POA: Diagnosis not present

## 2017-03-13 DIAGNOSIS — E559 Vitamin D deficiency, unspecified: Secondary | ICD-10-CM | POA: Diagnosis not present

## 2017-03-13 DIAGNOSIS — E784 Other hyperlipidemia: Secondary | ICD-10-CM | POA: Diagnosis not present

## 2017-03-17 DIAGNOSIS — R7301 Impaired fasting glucose: Secondary | ICD-10-CM | POA: Diagnosis not present

## 2017-03-17 DIAGNOSIS — G5712 Meralgia paresthetica, left lower limb: Secondary | ICD-10-CM | POA: Diagnosis not present

## 2017-03-17 DIAGNOSIS — E668 Other obesity: Secondary | ICD-10-CM | POA: Diagnosis not present

## 2017-03-17 DIAGNOSIS — Z9049 Acquired absence of other specified parts of digestive tract: Secondary | ICD-10-CM | POA: Diagnosis not present

## 2017-03-17 DIAGNOSIS — R197 Diarrhea, unspecified: Secondary | ICD-10-CM | POA: Diagnosis not present

## 2017-03-17 DIAGNOSIS — Z6832 Body mass index (BMI) 32.0-32.9, adult: Secondary | ICD-10-CM | POA: Diagnosis not present

## 2017-03-17 DIAGNOSIS — Z7901 Long term (current) use of anticoagulants: Secondary | ICD-10-CM | POA: Diagnosis not present

## 2017-03-17 DIAGNOSIS — Z0000 Encounter for general adult medical examination without abnormal findings: Secondary | ICD-10-CM | POA: Diagnosis not present

## 2017-03-17 DIAGNOSIS — E781 Pure hyperglyceridemia: Secondary | ICD-10-CM | POA: Diagnosis not present

## 2017-03-17 DIAGNOSIS — E784 Other hyperlipidemia: Secondary | ICD-10-CM | POA: Diagnosis not present

## 2017-03-17 DIAGNOSIS — M859 Disorder of bone density and structure, unspecified: Secondary | ICD-10-CM | POA: Diagnosis not present

## 2017-03-17 DIAGNOSIS — I480 Paroxysmal atrial fibrillation: Secondary | ICD-10-CM | POA: Diagnosis not present

## 2017-03-20 NOTE — Addendum Note (Signed)
Addendum  created 03/20/17 1210 by Rica Koyanagi, MD   Sign clinical note

## 2017-03-24 DIAGNOSIS — L720 Epidermal cyst: Secondary | ICD-10-CM | POA: Diagnosis not present

## 2017-03-24 DIAGNOSIS — D1801 Hemangioma of skin and subcutaneous tissue: Secondary | ICD-10-CM | POA: Diagnosis not present

## 2017-03-24 DIAGNOSIS — Z85820 Personal history of malignant melanoma of skin: Secondary | ICD-10-CM | POA: Diagnosis not present

## 2017-03-24 DIAGNOSIS — L821 Other seborrheic keratosis: Secondary | ICD-10-CM | POA: Diagnosis not present

## 2017-03-24 DIAGNOSIS — D2261 Melanocytic nevi of right upper limb, including shoulder: Secondary | ICD-10-CM | POA: Diagnosis not present

## 2017-03-24 DIAGNOSIS — Z85828 Personal history of other malignant neoplasm of skin: Secondary | ICD-10-CM | POA: Diagnosis not present

## 2017-03-25 ENCOUNTER — Ambulatory Visit (INDEPENDENT_AMBULATORY_CARE_PROVIDER_SITE_OTHER): Payer: Medicare Other | Admitting: Orthopaedic Surgery

## 2017-03-25 DIAGNOSIS — M7541 Impingement syndrome of right shoulder: Secondary | ICD-10-CM | POA: Diagnosis not present

## 2017-03-25 NOTE — Progress Notes (Signed)
Melinda Lucas is a 76 year old who is following up 1 month after I placed a steroid injection in her right shoulder subacromial space to treat impingement syndrome. She says she is 80% better and doing much better overall.  On examination she can reach above her head and behind her much better and her pain is minimal.  I talked about things that she can do to continue to improve her shoulder function. I was certainly consider repeat injection in a month or so if she still hurting. She understands this fully and all questions were encouraged and answered. She'll follow up otherwise as needed.

## 2017-05-14 DIAGNOSIS — Z961 Presence of intraocular lens: Secondary | ICD-10-CM | POA: Diagnosis not present

## 2017-05-14 DIAGNOSIS — H43812 Vitreous degeneration, left eye: Secondary | ICD-10-CM | POA: Diagnosis not present

## 2017-05-14 DIAGNOSIS — H179 Unspecified corneal scar and opacity: Secondary | ICD-10-CM | POA: Diagnosis not present

## 2017-05-14 DIAGNOSIS — H524 Presbyopia: Secondary | ICD-10-CM | POA: Diagnosis not present

## 2017-07-07 DIAGNOSIS — Z7901 Long term (current) use of anticoagulants: Secondary | ICD-10-CM | POA: Diagnosis not present

## 2017-07-07 DIAGNOSIS — I480 Paroxysmal atrial fibrillation: Secondary | ICD-10-CM | POA: Diagnosis not present

## 2017-07-07 DIAGNOSIS — G4733 Obstructive sleep apnea (adult) (pediatric): Secondary | ICD-10-CM | POA: Diagnosis not present

## 2017-07-07 DIAGNOSIS — E668 Other obesity: Secondary | ICD-10-CM | POA: Diagnosis not present

## 2017-07-16 DIAGNOSIS — M8588 Other specified disorders of bone density and structure, other site: Secondary | ICD-10-CM | POA: Diagnosis not present

## 2017-07-16 DIAGNOSIS — N958 Other specified menopausal and perimenopausal disorders: Secondary | ICD-10-CM | POA: Diagnosis not present

## 2017-07-16 DIAGNOSIS — Z1231 Encounter for screening mammogram for malignant neoplasm of breast: Secondary | ICD-10-CM | POA: Diagnosis not present

## 2017-07-16 DIAGNOSIS — Z6833 Body mass index (BMI) 33.0-33.9, adult: Secondary | ICD-10-CM | POA: Diagnosis not present

## 2017-07-16 DIAGNOSIS — Z124 Encounter for screening for malignant neoplasm of cervix: Secondary | ICD-10-CM | POA: Diagnosis not present

## 2017-08-01 DIAGNOSIS — Z23 Encounter for immunization: Secondary | ICD-10-CM | POA: Diagnosis not present

## 2017-08-19 DIAGNOSIS — G4733 Obstructive sleep apnea (adult) (pediatric): Secondary | ICD-10-CM | POA: Diagnosis not present

## 2017-09-29 DIAGNOSIS — D2261 Melanocytic nevi of right upper limb, including shoulder: Secondary | ICD-10-CM | POA: Diagnosis not present

## 2017-09-29 DIAGNOSIS — D1801 Hemangioma of skin and subcutaneous tissue: Secondary | ICD-10-CM | POA: Diagnosis not present

## 2017-09-29 DIAGNOSIS — D225 Melanocytic nevi of trunk: Secondary | ICD-10-CM | POA: Diagnosis not present

## 2017-09-29 DIAGNOSIS — L821 Other seborrheic keratosis: Secondary | ICD-10-CM | POA: Diagnosis not present

## 2017-09-29 DIAGNOSIS — D224 Melanocytic nevi of scalp and neck: Secondary | ICD-10-CM | POA: Diagnosis not present

## 2017-09-29 DIAGNOSIS — Z85820 Personal history of malignant melanoma of skin: Secondary | ICD-10-CM | POA: Diagnosis not present

## 2017-09-29 DIAGNOSIS — L820 Inflamed seborrheic keratosis: Secondary | ICD-10-CM | POA: Diagnosis not present

## 2017-09-29 DIAGNOSIS — Z85828 Personal history of other malignant neoplasm of skin: Secondary | ICD-10-CM | POA: Diagnosis not present

## 2018-01-11 ENCOUNTER — Encounter: Payer: Self-pay | Admitting: Internal Medicine

## 2018-01-11 ENCOUNTER — Ambulatory Visit (INDEPENDENT_AMBULATORY_CARE_PROVIDER_SITE_OTHER): Payer: Medicare Other | Admitting: Internal Medicine

## 2018-01-11 VITALS — BP 130/84 | HR 78 | Ht 64.0 in | Wt 184.2 lb

## 2018-01-11 DIAGNOSIS — I480 Paroxysmal atrial fibrillation: Secondary | ICD-10-CM | POA: Diagnosis not present

## 2018-01-11 DIAGNOSIS — G4733 Obstructive sleep apnea (adult) (pediatric): Secondary | ICD-10-CM | POA: Diagnosis not present

## 2018-01-11 DIAGNOSIS — I495 Sick sinus syndrome: Secondary | ICD-10-CM | POA: Diagnosis not present

## 2018-01-11 NOTE — Progress Notes (Signed)
PCP: Marton Redwood, MD Primary Cardiologist: Dr Wynonia Lawman Primary EP: Dr Rayann Heman  Melinda Lucas is a 77 y.o. female who presents today for routine electrophysiology followup.  Since last being seen in our clinic, the patient reports doing very well. No afib. Today, she denies symptoms of palpitations, chest pain, shortness of breath,  lower extremity edema, dizziness, presyncope, or syncope.  The patient is otherwise without complaint today.   Past Medical History:  Diagnosis Date  . Arthritis    "a little in my fingers" (12/16/2016)  . Atrial fibrillation ([facility])   . GERD (gastroesophageal reflux disease)   . Heart murmur   . History of kidney stones   . Melanoma in situ of lower leg ([facility])   . Obesity (BMI 30-39.9)   . OSA on CPAP    "started it summer 2017"   . Osteopenia    Past Surgical History:  Procedure Laterality Date  . CATARACT EXTRACTION W/ INTRAOCULAR LENS  IMPLANT, BILATERAL Bilateral ~ 2014  . CHOLECYSTECTOMY N/A 12/17/2016   Procedure: LAPAROSCOPIC CHOLECYSTECTOMY;  Surgeon: Coralie Keens, MD;  Location: [facility];  Service: General;  Laterality: N/A;  . COLONOSCOPY    . DILATION AND CURETTAGE OF UTERUS  1970's  . ELECTROPHYSIOLOGIC STUDY N/A 08/09/2015   Procedure: Atrial Fibrillation Ablation;  Surgeon: Thompson Grayer, MD;  Location: [facility];  Service: Cardiovascular;  Laterality: N/A;  . FRACTURE SURGERY    . MELANOMA EXCISION Left 2010   leg  . TEE WITHOUT CARDIOVERSION N/A 08/09/2015   Procedure: TRANSESOPHAGEAL ECHOCARDIOGRAM (TEE);  Surgeon: Jerline Pain, MD;  Location: [facility];  Service: Cardiovascular;  Laterality: N/A;  . TONSILLECTOMY  1946  . TUBAL LIGATION  1970's  . WRIST FRACTURE SURGERY Left 2011    ROS- all systems are reviewed and negatives except as per HPI above  Current Outpatient Medications  Medication Sig Dispense Refill  . atorvastatin (LIPITOR) 20 MG tablet Take 20 mg by mouth daily.    Marland Kitchen ELIQUIS 5 MG TABS tablet Take 5 mg  by mouth 2 (two) times daily.  12  . PRESCRIPTION MEDICATION Apply 1 application topically daily. Cream for face, filled at [facility]    . tretinoin (RETIN-A) 0.025 % cream Apply 1 application topically at bedtime.    . TURMERIC PO Take 1 capsule by mouth daily.     . Vitamin D, Ergocalciferol, (DRISDOL) 50000 UNITS CAPS Take 50,000 Units by mouth every Monday.      No current facility-administered medications for this visit.     Physical Exam: Vitals:   01/11/18 1631  BP: 130/84  Pulse: 78  SpO2: 96%  Weight: 184 lb 4 oz (83.6 kg)  Height: 5\' 4"  (1.626 m)    GEN- The patient is well appearing, alert and oriented x 3 today.   Head- normocephalic, atraumatic Eyes-  Sclera clear, conjunctiva pink Ears- hearing intact Oropharynx- clear Lungs- Clear to ausculation bilaterally, normal work of breathing Heart- Regular rate and rhythm, no murmurs, rubs or gallops, PMI not laterally displaced GI- soft, NT, ND, + BS Extremities- no clubbing, cyanosis, or edema  EKG tracing ordered today is personally reviewed and shows sinus rhythm  Assessment and Plan:  1. Paroxysmal atrial fibrillation Maintaining sinus rhythm off AAD therapy chads2vasc score is 3. Continue eliquis  2. Obesity Body mass index is 31.63 kg/m. Lifestyle modification encouraged today  3. HL She reports total cholesterol is 130.  She asks if she can stop her statin. I have advised  that given her age and that she is on it for primary prevention that it would be very reasonable to stop her statin and reassess.  I have however instructed her to discuss this further with Drs Wynonia Lawman and Brigitte Pulse before making changes  Return to see me in 12 months Follow-up with Dr Wynonia Lawman as scheduled  Thompson Grayer MD, [facility] 01/11/2018 4:47 PM

## 2018-01-11 NOTE — Patient Instructions (Signed)

## 2018-03-16 DIAGNOSIS — R7301 Impaired fasting glucose: Secondary | ICD-10-CM | POA: Diagnosis not present

## 2018-03-16 DIAGNOSIS — M859 Disorder of bone density and structure, unspecified: Secondary | ICD-10-CM | POA: Diagnosis not present

## 2018-03-16 DIAGNOSIS — R82998 Other abnormal findings in urine: Secondary | ICD-10-CM | POA: Diagnosis not present

## 2018-03-16 DIAGNOSIS — E7849 Other hyperlipidemia: Secondary | ICD-10-CM | POA: Diagnosis not present

## 2018-03-22 DIAGNOSIS — G4733 Obstructive sleep apnea (adult) (pediatric): Secondary | ICD-10-CM | POA: Diagnosis not present

## 2018-03-22 DIAGNOSIS — M8580 Other specified disorders of bone density and structure, unspecified site: Secondary | ICD-10-CM | POA: Diagnosis not present

## 2018-03-22 DIAGNOSIS — I480 Paroxysmal atrial fibrillation: Secondary | ICD-10-CM | POA: Diagnosis not present

## 2018-03-22 DIAGNOSIS — E559 Vitamin D deficiency, unspecified: Secondary | ICD-10-CM | POA: Diagnosis not present

## 2018-03-22 DIAGNOSIS — Z1389 Encounter for screening for other disorder: Secondary | ICD-10-CM | POA: Diagnosis not present

## 2018-03-22 DIAGNOSIS — Z0000 Encounter for general adult medical examination without abnormal findings: Secondary | ICD-10-CM | POA: Diagnosis not present

## 2018-03-22 DIAGNOSIS — E669 Obesity, unspecified: Secondary | ICD-10-CM | POA: Diagnosis not present

## 2018-03-22 DIAGNOSIS — E7849 Other hyperlipidemia: Secondary | ICD-10-CM | POA: Diagnosis not present

## 2018-03-22 DIAGNOSIS — R7301 Impaired fasting glucose: Secondary | ICD-10-CM | POA: Diagnosis not present

## 2018-03-22 DIAGNOSIS — K529 Noninfective gastroenteritis and colitis, unspecified: Secondary | ICD-10-CM | POA: Diagnosis not present

## 2018-03-22 DIAGNOSIS — Z7901 Long term (current) use of anticoagulants: Secondary | ICD-10-CM | POA: Diagnosis not present

## 2018-03-22 DIAGNOSIS — Z6830 Body mass index (BMI) 30.0-30.9, adult: Secondary | ICD-10-CM | POA: Diagnosis not present

## 2018-03-30 DIAGNOSIS — L814 Other melanin hyperpigmentation: Secondary | ICD-10-CM | POA: Diagnosis not present

## 2018-03-30 DIAGNOSIS — L821 Other seborrheic keratosis: Secondary | ICD-10-CM | POA: Diagnosis not present

## 2018-03-30 DIAGNOSIS — Z85828 Personal history of other malignant neoplasm of skin: Secondary | ICD-10-CM | POA: Diagnosis not present

## 2018-03-30 DIAGNOSIS — Z85820 Personal history of malignant melanoma of skin: Secondary | ICD-10-CM | POA: Diagnosis not present

## 2018-03-30 DIAGNOSIS — L738 Other specified follicular disorders: Secondary | ICD-10-CM | POA: Diagnosis not present

## 2018-04-19 DIAGNOSIS — Z1212 Encounter for screening for malignant neoplasm of rectum: Secondary | ICD-10-CM | POA: Diagnosis not present

## 2018-04-24 ENCOUNTER — Emergency Department (HOSPITAL_COMMUNITY): Payer: Medicare Other

## 2018-04-24 ENCOUNTER — Emergency Department (HOSPITAL_COMMUNITY)
Admission: EM | Admit: 2018-04-24 | Discharge: 2018-04-24 | Disposition: A | Discharge status: Home / Self Care | Payer: Medicare Other | Attending: Emergency Medicine | Admitting: Emergency Medicine

## 2018-04-24 ENCOUNTER — Encounter (HOSPITAL_COMMUNITY): Payer: Self-pay | Admitting: Emergency Medicine

## 2018-04-24 DIAGNOSIS — R05 Cough: Secondary | ICD-10-CM

## 2018-04-24 DIAGNOSIS — H10021 Other mucopurulent conjunctivitis, right eye: Secondary | ICD-10-CM | POA: Diagnosis present

## 2018-04-24 DIAGNOSIS — I10 Essential (primary) hypertension: Secondary | ICD-10-CM | POA: Diagnosis not present

## 2018-04-24 DIAGNOSIS — R059 Cough, unspecified: Secondary | ICD-10-CM

## 2018-04-24 DIAGNOSIS — Z79899 Other long term (current) drug therapy: Secondary | ICD-10-CM | POA: Insufficient documentation

## 2018-04-24 DIAGNOSIS — H1031 Unspecified acute conjunctivitis, right eye: Secondary | ICD-10-CM | POA: Insufficient documentation

## 2018-04-24 DIAGNOSIS — Z87891 Personal history of nicotine dependence: Secondary | ICD-10-CM | POA: Insufficient documentation

## 2018-04-24 DIAGNOSIS — Z7901 Long term (current) use of anticoagulants: Secondary | ICD-10-CM | POA: Insufficient documentation

## 2018-04-24 MED ORDER — ERYTHROMYCIN 5 MG/GM OP OINT: TOPICAL_OINTMENT | OPHTHALMIC | 0 refills | Status: DC

## 2018-04-24 NOTE — ED Notes (Signed)
Patient able to ambulate independently  

## 2018-04-24 NOTE — ED Provider Notes (Signed)
[facility] EMERGENCY DEPARTMENT Provider Note   CSN: [id number] Arrival date & time: 04/24/18  4854     History   Chief Complaint Chief Complaint  Patient presents with  . Eye Problem    HPI Melinda Lucas is a 77 y.o. female.  HPI   Melinda Lucas is a 77yo female with a history of paroxysmal atrial fibrillation, OSA, hyperlipidemia, osteopenia who presents to the emergency department for evaluation of cold and right eye.  Patient reports that 3 days ago she developed mild sore throat, cough, congestion and ear fullness.  She has been taking Mucinex which has been helping the symptoms.  She states that cough is productive of a green sputum, denies associated shortness of breath or chest pain.  States that yesterday she noticed some drainage and redness in her right eye.  When she woke up this morning her eye was matted shut due to the drainage.  She reports drainage looks thick and white in color.  States that she has some mild blurry vision in the right eye when the drainage builds up, but this improves with wiping the eye.  Denies recent injury to the eye or feeling eye foreign body.  She denies eye pain, photophobia, fever, sinus pain, ear pain, headache, neck stiffness.  Her husband is sick at home with similar URI symptoms.  Denies any recent contacts with pinkeye.  She has had cataract surgery in bilateral eyes years ago.  Past Medical History:  Diagnosis Date  . Arthritis    "a little in my fingers" (12/16/2016)  . Atrial fibrillation ([facility])   . GERD (gastroesophageal reflux disease)   . Heart murmur   . History of kidney stones   . Melanoma in situ of lower leg ([facility] [facility])   . Obesity (BMI 30-39.9)   . OSA on CPAP    "started it summer 2017"   . Osteopenia     Patient Active Problem List   Diagnosis Date Noted  . Impingement syndrome of right shoulder 02/25/2017  . RUQ pain   . Acute cholecystitis 12/16/2016  . OSA (obstructive sleep apnea) 12/16/2016  .  Hypertension 12/16/2016  . Sick sinus syndrome ([facility]) 08/06/2015  . Obesity (BMI 30-39.9) 07/14/2015  . Paroxysmal atrial fibrillation (HCC)   . Long-term (current) use of anticoagulants   . 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 Hyperlipidemia     Past Surgical History:  Procedure Laterality Date  . CATARACT EXTRACTION W/ INTRAOCULAR LENS  IMPLANT, BILATERAL Bilateral ~ 2014  . CHOLECYSTECTOMY N/A 12/17/2016   Procedure: LAPAROSCOPIC CHOLECYSTECTOMY;  Surgeon: Coralie Keens, MD;  Location: [facility];  Service: General;  Laterality: N/A;  . COLONOSCOPY    . DILATION AND CURETTAGE OF UTERUS  1970's  . ELECTROPHYSIOLOGIC STUDY N/A 08/09/2015   Procedure: Atrial Fibrillation Ablation;  Surgeon: Thompson Grayer, MD;  Location: [facility];  Service: Cardiovascular;  Laterality: N/A;  . FRACTURE SURGERY    . MELANOMA EXCISION Left 2010   leg  . TEE WITHOUT CARDIOVERSION N/A 08/09/2015   Procedure: TRANSESOPHAGEAL ECHOCARDIOGRAM (TEE);  Surgeon: Jerline Pain, MD;  Location: [facility];  Service: Cardiovascular;  Laterality: N/A;  . TONSILLECTOMY  1946  . TUBAL LIGATION  1970's  . WRIST FRACTURE SURGERY Left 2011     OB History   None      Home Medications    Prior to Admission medications   Medication Sig Start Date End Date Taking? Authorizing Provider  atorvastatin (LIPITOR) 20  MG tablet Take 20 mg by mouth daily. 07/07/15   [provider]  ELIQUIS 5 MG TABS tablet Take 5 mg by mouth 2 (two) times daily. 06/24/15   [provider]  PRESCRIPTION MEDICATION Apply 1 application topically daily. Cream for face, filled at [facility]    [provider]  tretinoin (RETIN-A) 0.025 % cream Apply 1 application topically at bedtime.    [provider]  TURMERIC PO Take 1 capsule by mouth daily.     [provider]  Vitamin D, Ergocalciferol, (DRISDOL) 50000 UNITS CAPS Take 50,000 Units by mouth every Monday.     [provider]     Family History Family History  Problem Relation Age of Onset  . Colon cancer Mother 75  . AAA (abdominal aortic aneurysm) Father   . Hypertension Brother     Social History Social History   Tobacco Use  . Smoking status: Former Smoker    Packs/day: 1.00    Years: 7.00    Pack years: 7.00    Types: Cigarettes  . Smokeless tobacco: Never Used  . Tobacco comment: "quit smoking in ~ 1970"  Substance Use Topics  . Alcohol use: Yes    Alcohol/week: 8.4 oz    Types: 7 Shots of liquor, 7 Standard drinks or equivalent per week  . Drug use: No     Allergies   Patient has no known allergies.   Review of Systems Review of Systems Constitutional: Negative for chills and fever.  HENT: Positive for congestion, postnasal drip, rhinorrhea and sore throat. Negative for ear pain and trouble swallowing.   Eyes: Positive for discharge, redness and visual disturbance. Negative for pain.  Respiratory: Positive for cough. Negative for shortness of breath.   Cardiovascular: Negative for chest pain.  Gastrointestinal: Negative for abdominal pain, nausea and vomiting.  Musculoskeletal: Negative for neck stiffness.  Skin: Negative for rash.  Neurological: Negative for headaches.    Physical Exam Updated Vital Signs BP 121/68   Pulse 90   Temp 98.7 F (37.1 C) (Oral)   Resp 15   SpO2 95%   Physical Exam  Constitutional: She appears well-developed and well-nourished. No distress.  Non-toxic appearing.   HENT:  Head: Normocephalic and atraumatic.  Mouth/Throat: Oropharynx is clear and moist. No oropharyngeal exudate.  Bilateral TMs with good cone of light.  Posterior oropharynx without erythema, tonsillar exudate or edema.  No maxillary or frontal sinus tenderness.  Eyes: Right eye exhibits no discharge. Left eye exhibits no discharge.  Appearance. Visual acuity 20/25RE, 20/25LE, 20/25BE. Right eye with conjunctival injection and thick white discharge matted on bilateral upper and  lower lid margin. Left eye without drainage or conjunctival injection. No lid swelling or erythema.  PERRL intact. EOMI without nystagmus or pain. No consensual photophobia.   Neck: Normal range of motion. Neck supple.  Pulmonary/Chest: Effort normal. No respiratory distress.  Coarse cough noted.  Lungs clear to auscultation.  Lymphadenopathy:    She has no cervical adenopathy.  Neurological: She is alert. Coordination normal.  Skin: She is not diaphoretic.  Psychiatric: She has a normal mood and affect. Her behavior is normal.  Nursing note and vitals reviewed.  ED Treatments / Results  Labs (all labs ordered are listed, but only abnormal results are displayed) Labs Reviewed - No data to display  EKG None  Radiology Dg Chest 2 View  Result Date: 04/24/2018 CLINICAL DATA:  Productive cough EXAM: CHEST - 2 VIEW COMPARISON:  12/16/2016 chest radiograph. FINDINGS:  Stable cardiomediastinal silhouette with normal heart size. No pneumothorax. No pleural effusion. Hyperinflated lungs. No pulmonary edema. No acute consolidative airspace disease. IMPRESSION: Hyperinflated lungs, cannot exclude COPD. Otherwise no active cardiopulmonary disease. Electronically Signed   By: Ilona Sorrel M.D.   On: 04/24/2018 10:50    Procedures Procedures (including critical care time)  Medications Ordered in ED Medications - No data to display   Initial Impression / Assessment and Plan / ED Course  I have reviewed the triage vital signs and the nursing notes.  Pertinent labs & imaging results that were available during my care of the patient were reviewed by me and considered in my medical decision making (see chart for details).     Staci Righter presents with symptoms consistent with conjunctivitis.  Purulent discharge exam.  No eye pain or visual disturbance. No photophobia. Presentation non-concerning for iritis, corneal abrasions, or HSV.  No evidence of preseptal or orbital cellulitis.  Pt is not a  contact lens wearer.  Will cover her for bacterial conjunctivitis with erythromycin ophthalmic.  Personal hygiene and frequent handwashing discussed. Patient advised to followup with ophthalmologist for reevaluation if symptoms are not improving in 48hrs.   In terms of her recent cough, congestion and ear fullness this is likely viral syndrome. CXR negative for pneumonia. No signs of otitis. No headache or nuchal rigidity to suggest meningitis. Her vital signs are stable. Counseled her on symptomatic management and return precautions. Patient verbalizes understanding and is agreeable with plan and discharge instructions.  Final Clinical Impressions(s) / ED Diagnoses   Final diagnoses:  Acute conjunctivitis of right eye, unspecified acute conjunctivitis type  Cough    ED Discharge Orders        Ordered    erythromycin ophthalmic ointment     04/24/18 1119       Glyn Ade, Hershal Coria 04/24/18 1155    Valarie Merino, MD 04/25/18 [phone]

## 2018-04-24 NOTE — ED Triage Notes (Signed)
Pt reports cold symptoms the past few days, yesterday R eye started getting red, irritated and draining. States eyelid was matted shut this am. Denies pain.

## 2018-04-24 NOTE — Discharge Instructions (Addendum)
Your chest xray was reassuring, no pneumonia.   Please apply antibiotic eye ointment to the lower right lid 4 times a day for the next 5 days.    Schedule an appointment with your regular doctor if your symptoms are not improving in 48 hours.  Please also make an appointment sooner if you have fever, trouble with your vision or eye pain.

## 2018-04-24 NOTE — ED Notes (Signed)
Patient complains of cold symptoms for the past few days with cough, NAD

## 2018-04-28 DIAGNOSIS — J019 Acute sinusitis, unspecified: Secondary | ICD-10-CM | POA: Diagnosis not present

## 2018-04-28 DIAGNOSIS — H6692 Otitis media, unspecified, left ear: Secondary | ICD-10-CM | POA: Diagnosis not present

## 2018-05-18 DIAGNOSIS — Z961 Presence of intraocular lens: Secondary | ICD-10-CM | POA: Diagnosis not present

## 2018-05-18 DIAGNOSIS — H179 Unspecified corneal scar and opacity: Secondary | ICD-10-CM | POA: Diagnosis not present

## 2018-05-18 DIAGNOSIS — H43812 Vitreous degeneration, left eye: Secondary | ICD-10-CM | POA: Diagnosis not present

## 2018-05-18 DIAGNOSIS — H1852 Epithelial (juvenile) corneal dystrophy: Secondary | ICD-10-CM | POA: Diagnosis not present

## 2018-06-17 DIAGNOSIS — Z23 Encounter for immunization: Secondary | ICD-10-CM | POA: Diagnosis not present

## 2018-07-13 DIAGNOSIS — G4733 Obstructive sleep apnea (adult) (pediatric): Secondary | ICD-10-CM | POA: Diagnosis not present

## 2018-07-13 DIAGNOSIS — E668 Other obesity: Secondary | ICD-10-CM | POA: Diagnosis not present

## 2018-07-13 DIAGNOSIS — I480 Paroxysmal atrial fibrillation: Secondary | ICD-10-CM | POA: Diagnosis not present

## 2018-07-13 DIAGNOSIS — Z7901 Long term (current) use of anticoagulants: Secondary | ICD-10-CM | POA: Diagnosis not present

## 2018-07-14 IMAGING — CR DG CHEST 2V
2 series · 2 of 2 positions shown · non-contrast
Comparison: 12/16/2016 chest radiograph.

CLINICAL DATA: Productive cough

EXAM:
CHEST - 2 VIEW

[chest pa]
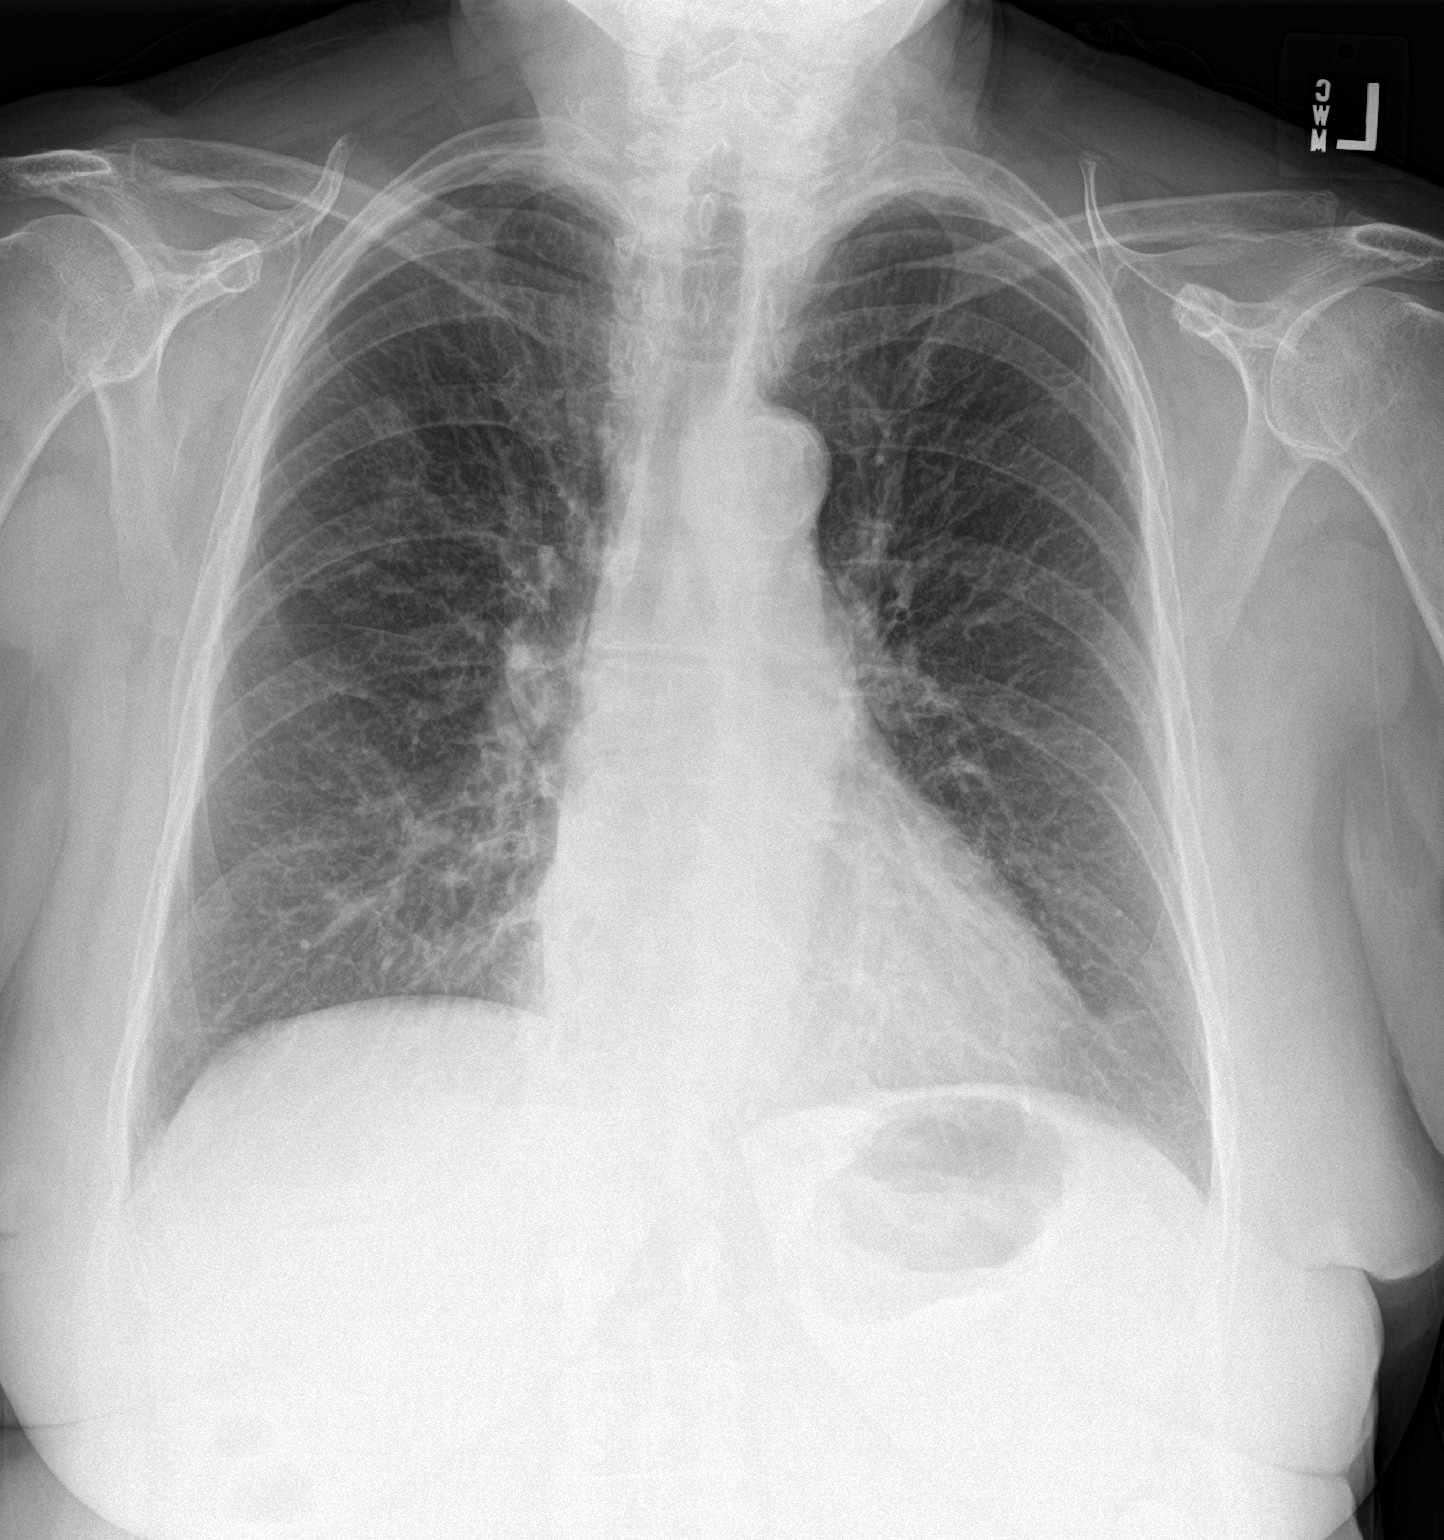

[chest lat]
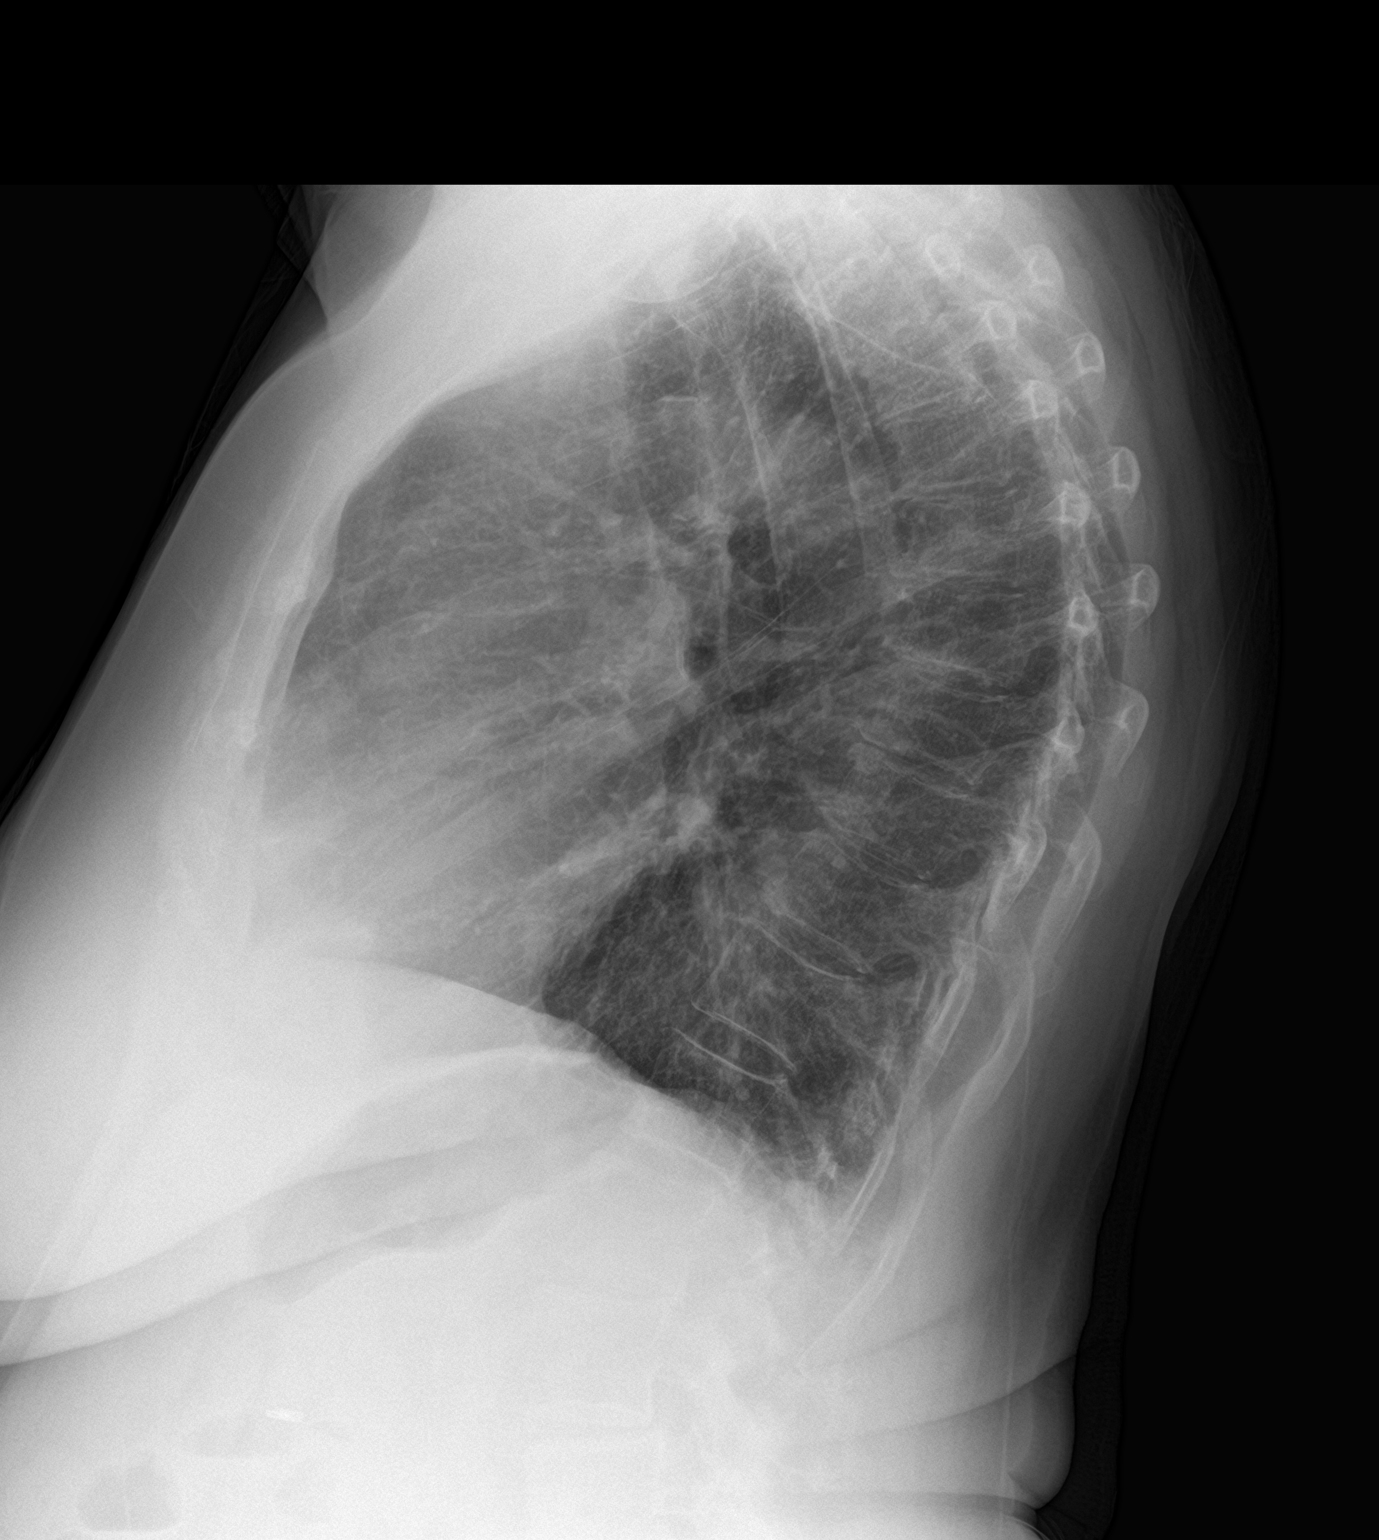

[2 of 2 positions shown; findings below may reference images not displayed]

FINDINGS: Stable cardiomediastinal silhouette with normal heart size. No
pneumothorax. No pleural effusion. Hyperinflated lungs. No pulmonary
edema. No acute consolidative airspace disease.
IMPRESSION: Hyperinflated lungs, cannot exclude COPD. Otherwise no active
cardiopulmonary disease.

## 2018-08-16 DIAGNOSIS — Z6830 Body mass index (BMI) 30.0-30.9, adult: Secondary | ICD-10-CM | POA: Diagnosis not present

## 2018-08-16 DIAGNOSIS — Z1231 Encounter for screening mammogram for malignant neoplasm of breast: Secondary | ICD-10-CM | POA: Diagnosis not present

## 2018-08-16 DIAGNOSIS — Z01419 Encounter for gynecological examination (general) (routine) without abnormal findings: Secondary | ICD-10-CM | POA: Diagnosis not present

## 2018-08-17 DIAGNOSIS — G4733 Obstructive sleep apnea (adult) (pediatric): Secondary | ICD-10-CM | POA: Diagnosis not present

## 2018-09-29 DIAGNOSIS — Z85820 Personal history of malignant melanoma of skin: Secondary | ICD-10-CM | POA: Diagnosis not present

## 2018-09-29 DIAGNOSIS — D2261 Melanocytic nevi of right upper limb, including shoulder: Secondary | ICD-10-CM | POA: Diagnosis not present

## 2018-09-29 DIAGNOSIS — D2272 Melanocytic nevi of left lower limb, including hip: Secondary | ICD-10-CM | POA: Diagnosis not present

## 2018-09-29 DIAGNOSIS — L821 Other seborrheic keratosis: Secondary | ICD-10-CM | POA: Diagnosis not present

## 2018-09-29 DIAGNOSIS — D224 Melanocytic nevi of scalp and neck: Secondary | ICD-10-CM | POA: Diagnosis not present

## 2018-09-29 DIAGNOSIS — D485 Neoplasm of uncertain behavior of skin: Secondary | ICD-10-CM | POA: Diagnosis not present

## 2018-09-29 DIAGNOSIS — Z85828 Personal history of other malignant neoplasm of skin: Secondary | ICD-10-CM | POA: Diagnosis not present

## 2018-09-29 DIAGNOSIS — L820 Inflamed seborrheic keratosis: Secondary | ICD-10-CM | POA: Diagnosis not present

## 2018-09-29 DIAGNOSIS — L244 Irritant contact dermatitis due to drugs in contact with skin: Secondary | ICD-10-CM | POA: Diagnosis not present

## 2018-09-29 DIAGNOSIS — L570 Actinic keratosis: Secondary | ICD-10-CM | POA: Diagnosis not present

## 2018-11-17 DIAGNOSIS — H903 Sensorineural hearing loss, bilateral: Secondary | ICD-10-CM | POA: Diagnosis not present

## 2018-12-03 ENCOUNTER — Telehealth: Payer: Self-pay

## 2018-12-03 MED ORDER — ELIQUIS 5 MG PO TABS: 5.0000 mg | ORAL_TABLET | Freq: Two times a day (BID) | ORAL | 0 refills | Status: DC

## 2018-12-03 NOTE — Telephone Encounter (Signed)
Sent prescription to [facility].  Pt has appt with Dr. Rayann Heman 12/22/2018.  Can address any further needs at that time.

## 2018-12-03 NOTE — Telephone Encounter (Signed)
Pt called in about refill on her Eliquis and would like to change her pharmacy over to [facility]. Pt is having the pharmacy call us with the correct information.

## 2018-12-22 ENCOUNTER — Encounter: Payer: Self-pay | Admitting: Internal Medicine

## 2018-12-22 ENCOUNTER — Ambulatory Visit (INDEPENDENT_AMBULATORY_CARE_PROVIDER_SITE_OTHER): Payer: Medicare Other | Admitting: Internal Medicine

## 2018-12-22 VITALS — BP 128/70 | HR 70 | Ht 64.0 in | Wt 189.2 lb

## 2018-12-22 DIAGNOSIS — I480 Paroxysmal atrial fibrillation: Secondary | ICD-10-CM

## 2018-12-22 NOTE — Progress Notes (Signed)
PCP: Marton Redwood, MD Primary Cardiologist: Dr Wynonia Lawman Primary EP: Dr Rayann Heman  Melinda Lucas is a 78 y.o. female who presents today for routine electrophysiology followup.  Since last being seen in our clinic, the patient reports doing very well.  Today, she denies symptoms of palpitations, chest pain, shortness of breath,  lower extremity edema, dizziness, presyncope, or syncope.  The patient is otherwise without complaint today.   Past Medical History:  Diagnosis Date  . Arthritis    "a little in my fingers" (12/16/2016)  . Atrial fibrillation ([place])   . GERD (gastroesophageal reflux disease)   . Heart murmur   . History of kidney stones   . Melanoma in situ of lower leg ([place])   . Obesity (BMI 30-39.9)   . OSA on CPAP    "started it summer 2017"   . Osteopenia    Past Surgical History:  Procedure Laterality Date  . CATARACT EXTRACTION W/ INTRAOCULAR LENS  IMPLANT, BILATERAL Bilateral ~ 2014  . CHOLECYSTECTOMY N/A 12/17/2016   Procedure: LAPAROSCOPIC CHOLECYSTECTOMY;  Surgeon: Coralie Keens, MD;  Location: [facility];  Service: General;  Laterality: N/A;  . COLONOSCOPY    . DILATION AND CURETTAGE OF UTERUS  1970's  . ELECTROPHYSIOLOGIC STUDY N/A 08/09/2015   Procedure: Atrial Fibrillation Ablation;  Surgeon: Thompson Grayer, MD;  Location: [facility];  Service: Cardiovascular;  Laterality: N/A;  . FRACTURE SURGERY    . MELANOMA EXCISION Left 2010   leg  . TEE WITHOUT CARDIOVERSION N/A 08/09/2015   Procedure: TRANSESOPHAGEAL ECHOCARDIOGRAM (TEE);  Surgeon: Jerline Pain, MD;  Location: [facility];  Service: Cardiovascular;  Laterality: N/A;  . TONSILLECTOMY  1946  . TUBAL LIGATION  1970's  . WRIST FRACTURE SURGERY Left 2011    ROS- all systems are reviewed and negatives except as per HPI above  Current Outpatient Medications  Medication Sig Dispense Refill  . ELIQUIS 5 MG TABS tablet Take 1 tablet (5 mg total) by mouth 2 (two) times daily. 180 tablet 0  . PRESCRIPTION  MEDICATION Apply 1 application topically daily. Cream for face, filled at [facility]    . TURMERIC PO Take 1 capsule by mouth daily.     . Vitamin D, Ergocalciferol, (DRISDOL) 50000 UNITS CAPS Take 50,000 Units by mouth every Monday.      No current facility-administered medications for this visit.     Physical Exam: Vitals:   12/22/18 1236  BP: 128/70  Pulse: 70  SpO2: 95%  Weight: 189 lb 3.2 oz (85.8 kg)  Height: 5\' 4"  (1.626 m)    GEN- The patient is well appearing, alert and oriented x 3 today.   Head- normocephalic, atraumatic Eyes-  Sclera clear, conjunctiva pink Ears- hearing intact Oropharynx- clear Lungs- Clear to ausculation bilaterally, normal work of breathing Heart- Regular rate and rhythm, no murmurs, rubs or gallops, PMI not laterally displaced GI- soft, NT, ND, + BS Extremities- no clubbing, cyanosis, or edema  Wt Readings from Last 3 Encounters:  12/22/18 189 lb 3.2 oz (85.8 kg)  01/11/18 184 lb 4 oz (83.6 kg)  02/25/17 196 lb (88.9 kg)    EKG tracing ordered today is personally reviewed and shows sinus rhythm  Assessment and Plan:  1. Paroxysmal atrial fibrillation Doing well off of AAD therapy chads2vasc score is 3.  On eliquis Today, we discussed REACT AF.  She would be interested in this study if it were an option.  2. Obesity Body mass index is 32.48 kg/m. Lifestyle modification  is encouraged  Follow-up in AF clinic every 6 months  Thompson Grayer MD, [facility] 12/22/2018 12:53 PM

## 2018-12-22 NOTE — Patient Instructions (Addendum)
Medication Instructions:  Your physician recommends that you continue on your current medications as directed. Please refer to the Current Medication list given to you today.  Labwork: None ordered.  Testing/Procedures: None ordered.  Follow-Up:  Your physician wants you to follow-up in: 6 months with the AFIB clinic.   You will receive a reminder letter in the mail two months in advance. If you don't receive a letter, please call our office to schedule the follow-up appointment.  Your physician wants you to follow-up in: one year with Dr. Rayann Heman.   You will receive a reminder letter in the mail two months in advance. If you don't receive a letter, please call our office to schedule the follow-up appointment.  Any Other Special Instructions Will Be Listed Below (If Applicable).  If you need a refill on your cardiac medications before your next appointment, please call your pharmacy.

## 2019-02-10 ENCOUNTER — Other Ambulatory Visit: Payer: Self-pay | Admitting: *Deleted

## 2019-02-10 ENCOUNTER — Other Ambulatory Visit: Payer: Self-pay | Admitting: Internal Medicine

## 2019-02-10 MED ORDER — ELIQUIS 5 MG PO TABS: 5.0000 mg | ORAL_TABLET | Freq: Two times a day (BID) | ORAL | 1 refills | Status: DC

## 2019-02-10 NOTE — Telephone Encounter (Signed)
Age 78, weight 86kg, SCr 0.8 on 03/17/18, appropriate for refill

## 2019-03-21 DIAGNOSIS — E7849 Other hyperlipidemia: Secondary | ICD-10-CM | POA: Diagnosis not present

## 2019-03-21 DIAGNOSIS — R7301 Impaired fasting glucose: Secondary | ICD-10-CM | POA: Diagnosis not present

## 2019-03-21 DIAGNOSIS — M859 Disorder of bone density and structure, unspecified: Secondary | ICD-10-CM | POA: Diagnosis not present

## 2019-03-23 DIAGNOSIS — R82998 Other abnormal findings in urine: Secondary | ICD-10-CM | POA: Diagnosis not present

## 2019-03-24 DIAGNOSIS — I480 Paroxysmal atrial fibrillation: Secondary | ICD-10-CM | POA: Diagnosis not present

## 2019-03-24 DIAGNOSIS — Z0000 Encounter for general adult medical examination without abnormal findings: Secondary | ICD-10-CM | POA: Diagnosis not present

## 2019-03-24 DIAGNOSIS — Z7901 Long term (current) use of anticoagulants: Secondary | ICD-10-CM | POA: Diagnosis not present

## 2019-03-24 DIAGNOSIS — Z86010 Personal history of colonic polyps: Secondary | ICD-10-CM | POA: Diagnosis not present

## 2019-03-24 DIAGNOSIS — E785 Hyperlipidemia, unspecified: Secondary | ICD-10-CM | POA: Diagnosis not present

## 2019-03-24 DIAGNOSIS — Z1331 Encounter for screening for depression: Secondary | ICD-10-CM | POA: Diagnosis not present

## 2019-03-24 DIAGNOSIS — G4733 Obstructive sleep apnea (adult) (pediatric): Secondary | ICD-10-CM | POA: Diagnosis not present

## 2019-03-24 DIAGNOSIS — M8580 Other specified disorders of bone density and structure, unspecified site: Secondary | ICD-10-CM | POA: Diagnosis not present

## 2019-03-24 DIAGNOSIS — R7301 Impaired fasting glucose: Secondary | ICD-10-CM | POA: Diagnosis not present

## 2019-03-24 DIAGNOSIS — E559 Vitamin D deficiency, unspecified: Secondary | ICD-10-CM | POA: Diagnosis not present

## 2019-03-24 DIAGNOSIS — K529 Noninfective gastroenteritis and colitis, unspecified: Secondary | ICD-10-CM | POA: Diagnosis not present

## 2019-03-24 DIAGNOSIS — E669 Obesity, unspecified: Secondary | ICD-10-CM | POA: Diagnosis not present

## 2019-04-26 LAB — HISTORICAL SURGICAL PATHOLOGY SPECIMEN

## 2019-04-29 LAB — HISTORICAL SURGICAL PATHOLOGY SPECIMEN

## 2019-04-30 LAB — HISTORICAL SURGICAL PATHOLOGY SPECIMEN

## 2019-05-04 DIAGNOSIS — L720 Epidermal cyst: Secondary | ICD-10-CM | POA: Diagnosis not present

## 2019-05-04 DIAGNOSIS — L820 Inflamed seborrheic keratosis: Secondary | ICD-10-CM | POA: Diagnosis not present

## 2019-05-04 DIAGNOSIS — Z85820 Personal history of malignant melanoma of skin: Secondary | ICD-10-CM | POA: Diagnosis not present

## 2019-05-04 DIAGNOSIS — D2261 Melanocytic nevi of right upper limb, including shoulder: Secondary | ICD-10-CM | POA: Diagnosis not present

## 2019-05-04 DIAGNOSIS — Z85828 Personal history of other malignant neoplasm of skin: Secondary | ICD-10-CM | POA: Diagnosis not present

## 2019-05-04 DIAGNOSIS — L814 Other melanin hyperpigmentation: Secondary | ICD-10-CM | POA: Diagnosis not present

## 2019-05-04 DIAGNOSIS — L570 Actinic keratosis: Secondary | ICD-10-CM | POA: Diagnosis not present

## 2019-05-04 DIAGNOSIS — L821 Other seborrheic keratosis: Secondary | ICD-10-CM | POA: Diagnosis not present

## 2019-05-04 DIAGNOSIS — D485 Neoplasm of uncertain behavior of skin: Secondary | ICD-10-CM | POA: Diagnosis not present

## 2019-05-09 DIAGNOSIS — W57XXXA Bitten or stung by nonvenomous insect and other nonvenomous arthropods, initial encounter: Secondary | ICD-10-CM | POA: Diagnosis not present

## 2019-05-09 DIAGNOSIS — R509 Fever, unspecified: Secondary | ICD-10-CM | POA: Diagnosis not present

## 2019-05-09 DIAGNOSIS — Z20818 Contact with and (suspected) exposure to other bacterial communicable diseases: Secondary | ICD-10-CM | POA: Diagnosis not present

## 2019-05-10 ENCOUNTER — Other Ambulatory Visit: Payer: Self-pay | Admitting: Internal Medicine

## 2019-05-12 ENCOUNTER — Other Ambulatory Visit: Payer: Self-pay | Admitting: Internal Medicine

## 2019-05-12 DIAGNOSIS — R509 Fever, unspecified: Secondary | ICD-10-CM

## 2019-05-12 DIAGNOSIS — R7989 Other specified abnormal findings of blood chemistry: Secondary | ICD-10-CM

## 2019-05-13 DIAGNOSIS — R7989 Other specified abnormal findings of blood chemistry: Secondary | ICD-10-CM | POA: Diagnosis not present

## 2019-05-20 ENCOUNTER — Other Ambulatory Visit: Payer: Medicare Other

## 2019-05-23 DIAGNOSIS — H1859 Other hereditary corneal dystrophies: Secondary | ICD-10-CM | POA: Diagnosis not present

## 2019-05-23 DIAGNOSIS — H43812 Vitreous degeneration, left eye: Secondary | ICD-10-CM | POA: Diagnosis not present

## 2019-05-23 DIAGNOSIS — H179 Unspecified corneal scar and opacity: Secondary | ICD-10-CM | POA: Diagnosis not present

## 2019-05-23 DIAGNOSIS — Z961 Presence of intraocular lens: Secondary | ICD-10-CM | POA: Diagnosis not present

## 2019-05-24 DIAGNOSIS — R7989 Other specified abnormal findings of blood chemistry: Secondary | ICD-10-CM | POA: Diagnosis not present

## 2019-05-24 DIAGNOSIS — R5383 Other fatigue: Secondary | ICD-10-CM | POA: Diagnosis not present

## 2019-05-24 DIAGNOSIS — R0600 Dyspnea, unspecified: Secondary | ICD-10-CM | POA: Diagnosis not present

## 2019-05-24 DIAGNOSIS — I480 Paroxysmal atrial fibrillation: Secondary | ICD-10-CM | POA: Diagnosis not present

## 2019-05-24 DIAGNOSIS — W57XXXA Bitten or stung by nonvenomous insect and other nonvenomous arthropods, initial encounter: Secondary | ICD-10-CM | POA: Diagnosis not present

## 2019-05-24 DIAGNOSIS — Z7901 Long term (current) use of anticoagulants: Secondary | ICD-10-CM | POA: Diagnosis not present

## 2019-06-08 DIAGNOSIS — R945 Abnormal results of liver function studies: Secondary | ICD-10-CM | POA: Diagnosis not present

## 2019-06-08 DIAGNOSIS — R7989 Other specified abnormal findings of blood chemistry: Secondary | ICD-10-CM | POA: Diagnosis not present

## 2019-06-28 ENCOUNTER — Ambulatory Visit (HOSPITAL_COMMUNITY): Payer: Medicare Other | Admitting: Nurse Practitioner

## 2019-06-29 ENCOUNTER — Other Ambulatory Visit: Payer: Self-pay

## 2019-06-29 ENCOUNTER — Ambulatory Visit (HOSPITAL_COMMUNITY)
Admission: RE | Admit: 2019-06-29 | Discharge: 2019-06-29 | Disposition: A | Discharge status: Home / Self Care | Payer: Medicare Other | Source: Ambulatory Visit | Attending: Nurse Practitioner | Admitting: Nurse Practitioner

## 2019-06-29 ENCOUNTER — Encounter (HOSPITAL_COMMUNITY): Payer: Self-pay | Admitting: Nurse Practitioner

## 2019-06-29 VITALS — BP 132/80 | HR 72 | Ht 64.0 in | Wt 189.0 lb

## 2019-06-29 DIAGNOSIS — Z87891 Personal history of nicotine dependence: Secondary | ICD-10-CM | POA: Diagnosis not present

## 2019-06-29 DIAGNOSIS — Z79899 Other long term (current) drug therapy: Secondary | ICD-10-CM | POA: Diagnosis not present

## 2019-06-29 DIAGNOSIS — G4733 Obstructive sleep apnea (adult) (pediatric): Secondary | ICD-10-CM | POA: Insufficient documentation

## 2019-06-29 DIAGNOSIS — Z7901 Long term (current) use of anticoagulants: Secondary | ICD-10-CM | POA: Diagnosis not present

## 2019-06-29 DIAGNOSIS — Z9889 Other specified postprocedural states: Secondary | ICD-10-CM | POA: Diagnosis not present

## 2019-06-29 DIAGNOSIS — I480 Paroxysmal atrial fibrillation: Secondary | ICD-10-CM

## 2019-06-29 NOTE — Progress Notes (Signed)
Primary Care Physician: Marton Redwood, MD Referring Physician:Dr. Rayann Heman   Melinda Lucas is a 78 y.o. female with a h/o afib s/p ablation 2016, that is in the afib clinic for f/u. She reports no afib. She was on Tikosyn, stopped after ablation. No c/o today.   Today, she denies symptoms of palpitations, chest pain, shortness of breath, orthopnea, PND, lower extremity edema, dizziness, presyncope, syncope, or neurologic sequela. The patient is tolerating medications without difficulties and is otherwise without complaint today.   Past Medical History:  Diagnosis Date  . Arthritis    "a little in my fingers" (12/16/2016)  . Atrial fibrillation ([place])   . GERD (gastroesophageal reflux disease)   . Heart murmur   . History of kidney stones   . Melanoma in situ of lower leg ([place])   . Obesity (BMI 30-39.9)   . OSA on CPAP    "started it summer 2017"   . Osteopenia    Past Surgical History:  Procedure Laterality Date  . CATARACT EXTRACTION W/ INTRAOCULAR LENS  IMPLANT, BILATERAL Bilateral ~ 2014  . CHOLECYSTECTOMY N/A 12/17/2016   Procedure: LAPAROSCOPIC CHOLECYSTECTOMY;  Surgeon: Coralie Keens, MD;  Location: [facility];  Service: General;  Laterality: N/A;  . COLONOSCOPY    . DILATION AND CURETTAGE OF UTERUS  1970's  . ELECTROPHYSIOLOGIC STUDY N/A 08/09/2015   Procedure: Atrial Fibrillation Ablation;  Surgeon: Thompson Grayer, MD;  Location: [facility];  Service: Cardiovascular;  Laterality: N/A;  . FRACTURE SURGERY    . MELANOMA EXCISION Left 2010   leg  . TEE WITHOUT CARDIOVERSION N/A 08/09/2015   Procedure: TRANSESOPHAGEAL ECHOCARDIOGRAM (TEE);  Surgeon: Jerline Pain, MD;  Location: [facility];  Service: Cardiovascular;  Laterality: N/A;  . TONSILLECTOMY  1946  . TUBAL LIGATION  1970's  . WRIST FRACTURE SURGERY Left 2011    Current Outpatient Medications  Medication Sig Dispense Refill  . ELIQUIS 5 MG TABS tablet TAKE 1 TABLET TWICE A DAY 180 tablet 1  . PRESCRIPTION  MEDICATION Apply 1 application topically daily. Cream for face, filled at [facility]    . TURMERIC PO Take 1 capsule by mouth daily.     . Vitamin D, Ergocalciferol, (DRISDOL) 50000 UNITS CAPS Take 50,000 Units by mouth every Monday.      No current facility-administered medications for this encounter.     No Known Allergies  Social History   Socioeconomic History  . Marital status: Married    Spouse name: Not on file  . Number of children: Not on file  . Years of education: Not on file  . Highest education level: Not on file  Occupational History  . Not on file  Social Needs  . Financial resource strain: Not on file  . Food insecurity    Worry: Not on file    Inability: Not on file  . Transportation needs    Medical: Not on file    Non-medical: Not on file  Tobacco Use  . Smoking status: Former Smoker    Packs/day: 1.00    Years: 7.00    Pack years: 7.00    Types: Cigarettes  . Smokeless tobacco: Never Used  . Tobacco comment: "quit smoking in ~ 1970"  Substance and Sexual Activity  . Alcohol use: Yes    Alcohol/week: 14.0 standard drinks    Types: 7 Shots of liquor, 7 Standard drinks or equivalent per week  . Drug use: No  . Sexual activity: Never  Lifestyle  . Physical  activity    Days per week: Not on file    Minutes per session: Not on file  . Stress: Not on file  Relationships  . Social Herbalist on phone: Not on file    Gets together: Not on file    Attends religious service: Not on file    Active member of club or organization: Not on file    Attends meetings of clubs or organizations: Not on file    Relationship status: Not on file  . Intimate partner violence    Fear of current or ex partner: Not on file    Emotionally abused: Not on file    Physically abused: Not on file    Forced sexual activity: Not on file  Other Topics Concern  . Not on file  Social History Narrative   Trains dogs currently, retired Cabin crew    Family History   Problem Relation Age of Onset  . Colon cancer Mother 39  . AAA (abdominal aortic aneurysm) Father   . Hypertension Brother     ROS- All systems are reviewed and negative except as per the HPI above  Physical Exam: Vitals:   06/29/19 1406  BP: 132/80  Pulse: 72  Weight: 85.7 kg  Height: 5\' 4"  (1.626 m)   Wt Readings from Last 3 Encounters:  06/29/19 85.7 kg  12/22/18 85.8 kg  01/11/18 83.6 kg    Labs: Lab Results  Component Value Date   NA 141 12/18/2016   K 4.3 12/18/2016   CL 111 12/18/2016   CO2 25 12/18/2016   GLUCOSE 128 (H) 12/18/2016   BUN 7 12/18/2016   CREATININE 0.82 12/18/2016   CALCIUM 8.2 (L) 12/18/2016   MG 1.9 07/17/2015   Lab Results  Component Value Date   INR 1.16 12/16/2016   No results found for: CHOL, HDL, LDLCALC, TRIG   GEN- The patient is well appearing, alert and oriented x 3 today.   Head- normocephalic, atraumatic Eyes-  Sclera clear, conjunctiva pink Ears- hearing intact Oropharynx- clear Neck- supple, no JVP Lymph- no cervical lymphadenopathy Lungs- Clear to ausculation bilaterally, normal work of breathing Heart- Regular rate and rhythm, no murmurs, rubs or gallops, PMI not laterally displaced GI- soft, NT, ND, + BS Extremities- no clubbing, cyanosis, or edema MS- no significant deformity or atrophy Skin- no rash or lesion Psych- euthymic mood, full affect Neuro- strength and sensation are intact  EKG-NSR, normal EKG. V rate 72 bpm, pr int 152 ms, qrs int 84 ms, qtc 440 ms Epic records reviewed     Assessment and Plan: 1. Paroxysmal afib Doing well staying in SR  2. CHA2DS2VASc score of 3 Doing well on eliquis 5 mg bid  Informed that tumeric can contribute to additional blood thinning  F/u with Dr. Rayann Heman in 6 months as scheduled in March 2021  Butch Penny C. , [facility] [address] [place], [place] [address] [phone]

## 2019-08-30 DIAGNOSIS — G4733 Obstructive sleep apnea (adult) (pediatric): Secondary | ICD-10-CM | POA: Diagnosis not present

## 2019-09-22 DIAGNOSIS — R300 Dysuria: Secondary | ICD-10-CM | POA: Diagnosis not present

## 2019-10-04 DIAGNOSIS — L723 Sebaceous cyst: Secondary | ICD-10-CM | POA: Diagnosis not present

## 2019-10-04 DIAGNOSIS — Z6832 Body mass index (BMI) 32.0-32.9, adult: Secondary | ICD-10-CM | POA: Diagnosis not present

## 2019-10-04 DIAGNOSIS — Z779 Other contact with and (suspected) exposures hazardous to health: Secondary | ICD-10-CM | POA: Diagnosis not present

## 2019-10-04 DIAGNOSIS — N9089 Other specified noninflammatory disorders of vulva and perineum: Secondary | ICD-10-CM | POA: Diagnosis not present

## 2019-10-04 DIAGNOSIS — Z1231 Encounter for screening mammogram for malignant neoplasm of breast: Secondary | ICD-10-CM | POA: Diagnosis not present

## 2019-10-04 DIAGNOSIS — N958 Other specified menopausal and perimenopausal disorders: Secondary | ICD-10-CM | POA: Diagnosis not present

## 2019-10-04 DIAGNOSIS — M8588 Other specified disorders of bone density and structure, other site: Secondary | ICD-10-CM | POA: Diagnosis not present

## 2019-10-12 ENCOUNTER — Ambulatory Visit: Payer: Medicare Other | Attending: Internal Medicine

## 2019-10-12 DIAGNOSIS — Z20822 Contact with and (suspected) exposure to covid-19: Secondary | ICD-10-CM

## 2019-10-13 LAB — NOVEL CORONAVIRUS, NAA: SARS-CoV-2, NAA: NOT DETECTED

## 2019-10-18 ENCOUNTER — Other Ambulatory Visit: Payer: Self-pay | Admitting: Nurse Practitioner

## 2019-10-18 DIAGNOSIS — U071 COVID-19: Secondary | ICD-10-CM

## 2019-10-18 DIAGNOSIS — I10 Essential (primary) hypertension: Secondary | ICD-10-CM

## 2019-10-18 NOTE — Progress Notes (Signed)
  I connected by phone with Melinda Lucas on 10/18/2019 at 7:49 AM to discuss the potential use of an new treatment for mild to moderate COVID-19 viral infection in non-hospitalized patients.  This patient is a 78 y.o. female that meets the FDA criteria for Emergency Use Authorization of bamlanivimab or casirivimab\imdevimab.  Has a (+) direct SARS-CoV-2 viral test result  Has mild or moderate COVID-19   Is ? 80 years of age and weighs ? 40 kg  Is NOT hospitalized due to COVID-19  Is NOT requiring oxygen therapy or requiring an increase in baseline oxygen flow rate due to COVID-19  Is within 10 days of symptom onset  Has at least one of the high risk factor(s) for progression to severe COVID-19 and/or hospitalization as defined in EUA.  Specific high risk criteria : Hypertension Patient Active Problem List   Diagnosis Date Noted  . Impingement syndrome of right shoulder 02/25/2017  . RUQ pain   . Acute cholecystitis 12/16/2016  . OSA (obstructive sleep apnea) 12/16/2016  . Hypertension 12/16/2016  . Sick sinus syndrome ([place]) 08/06/2015  . Obesity (BMI 30-39.9) 07/14/2015  . Paroxysmal atrial fibrillation (H[facility])   . Long-term (current) use of anticoagulants   . Aortic arch atherosclerosis ([place])   . Hyperlipidemia      I have spoken and communicated the following to the patient or parent/caregiver:  1. FDA has authorized the emergency use of bamlanivimab and casirivimab\imdevimab for the treatment of mild to moderate COVID-19 in adults and pediatric patients with positive results of direct SARS-CoV-2 viral testing who are 11 years of age and older weighing at least 40 kg, and who are at high risk for progressing to severe COVID-19 and/or hospitalization.  2. The significant known and potential risks and benefits of bamlanivimab and casirivimab\imdevimab, and the extent to which such potential risks and benefits are unknown.  3. Information on available alternative treatments and  the risks and benefits of those alternatives, including clinical trials.  4. Patients treated with bamlanivimab and casirivimab\imdevimab should continue to self-isolate and use infection control measures (e.g., wear mask, isolate, social distance, avoid sharing personal items, clean and disinfect "high touch" surfaces, and frequent handwashing) according to CDC guidelines.   5. The patient or parent/caregiver has the option to accept or refuse bamlanivimab or casirivimab\imdevimab .  After reviewing this information with the patient, The patient agreed to proceed with receiving the bamlanimivab infusion and will be provided a copy of the Fact sheet prior to receiving the infusion.Fenton Foy 10/18/2019 7:49 AM  Note: Patient will need a copy of positive COVID result from [facility] scanned in the chart before infusion.

## 2019-10-19 ENCOUNTER — Ambulatory Visit (HOSPITAL_COMMUNITY)
Admission: RE | Admit: 2019-10-19 | Discharge: 2019-10-19 | Disposition: A | Discharge status: Home / Self Care | Payer: Medicare Other | Source: Ambulatory Visit | Attending: Pulmonary Disease | Admitting: Pulmonary Disease

## 2019-10-19 DIAGNOSIS — Z23 Encounter for immunization: Secondary | ICD-10-CM | POA: Insufficient documentation

## 2019-10-19 DIAGNOSIS — U071 COVID-19: Secondary | ICD-10-CM | POA: Insufficient documentation

## 2019-10-19 DIAGNOSIS — I10 Essential (primary) hypertension: Secondary | ICD-10-CM | POA: Insufficient documentation

## 2019-10-19 MED ORDER — SODIUM CHLORIDE 0.9 % IV SOLN
INTRAVENOUS | Status: DC | PRN
  Administered 2019-10-19: 250 mL via INTRAVENOUS

## 2019-10-19 MED ORDER — DIPHENHYDRAMINE HCL 50 MG/ML IJ SOLN: 50.0000 mg | Freq: Once | INTRAMUSCULAR | Status: DC | PRN

## 2019-10-19 MED ORDER — METHYLPREDNISOLONE SODIUM SUCC 125 MG IJ SOLR: 125.0000 mg | Freq: Once | INTRAMUSCULAR | Status: DC | PRN

## 2019-10-19 MED ORDER — ALBUTEROL SULFATE HFA 108 (90 BASE) MCG/ACT IN AERS: 2.0000 | INHALATION_SPRAY | Freq: Once | RESPIRATORY_TRACT | Status: DC | PRN

## 2019-10-19 MED ORDER — FAMOTIDINE IN NACL 20-0.9 MG/50ML-% IV SOLN: 20.0000 mg | Freq: Once | INTRAVENOUS | Status: DC | PRN

## 2019-10-19 MED ORDER — EPINEPHRINE 0.3 MG/0.3ML IJ SOAJ: 0.3000 mg | Freq: Once | INTRAMUSCULAR | Status: DC | PRN

## 2019-10-19 MED ORDER — SODIUM CHLORIDE 0.9 % IV SOLN
700.0000 mg | Freq: Once | INTRAVENOUS | Status: AC
  Administered 2019-10-19: 12:00:00 700 mg via INTRAVENOUS
  Filled 2019-10-19: qty 20

## 2019-10-19 NOTE — Progress Notes (Signed)
  Diagnosis: COVID-19  Physician:  Procedure: Covid Infusion Clinic Med: bamlanivimab infusion - Provided patient with bamlanimivab fact sheet for patients, parents and caregivers prior to infusion.  Complications: No immediate complications noted.  Discharge: Discharged home   Melinda Lucas 10/19/2019

## 2019-10-19 NOTE — Discharge Instructions (Signed)
10 Things You Can Do to Manage Your COVID-19 Symptoms at Home If you have possible or confirmed COVID-19: 1. Stay home from work and school. And stay away from other public places. If you must go out, avoid using any kind of public transportation, ridesharing, or taxis. 2. Monitor your symptoms carefully. If your symptoms get worse, call your healthcare provider immediately. 3. Get rest and stay hydrated. 4. If you have a medical appointment, call the healthcare provider ahead of time and tell them that you have or may have COVID-19. 5. For medical emergencies, call 911 and notify the dispatch personnel that you have or may have COVID-19. 6. Cover your cough and sneezes with a tissue or use the inside of your elbow. 7. Wash your hands often with soap and water for at least 20 seconds or clean your hands with an alcohol-based hand sanitizer that contains at least 60% alcohol. 8. As much as possible, stay in a specific room and away from other people in your home. Also, you should use a separate bathroom, if available. If you need to be around other people in or outside of the home, wear a mask. 9. Avoid sharing personal items with other people in your household, like dishes, towels, and bedding. 10. Clean all surfaces that are touched often, like counters, tabletops, and doorknobs. Use household cleaning sprays or wipes according to the label instructions. cdc.gov/coronavirus 04/20/2019 This information is not intended to replace advice given to you by your health care provider. Make sure you discuss any questions you have with your health care provider. Document Revised: 09/22/2019 Document Reviewed: 09/22/2019 Elsevier Patient Education  2020 Elsevier Inc.  

## 2019-10-20 ENCOUNTER — Ambulatory Visit (HOSPITAL_COMMUNITY): Payer: Medicare Other

## 2019-10-21 ENCOUNTER — Other Ambulatory Visit: Payer: Self-pay | Admitting: Internal Medicine

## 2019-10-24 NOTE — Telephone Encounter (Signed)
Eliquis 5mg  refill request received, pt is 79yrs old, weight-85.7kg, Crea-0.80 on 05/24/2019 via [facility] from [facility], Diagnosis-Afib, and last seen by Roderic Palau on 06/29/2019. Dose is appropriate based on dosing criteria. Will send in refill to requested pharmacy.

## 2019-10-25 ENCOUNTER — Telehealth: Payer: Self-pay | Admitting: Internal Medicine

## 2019-10-25 ENCOUNTER — Other Ambulatory Visit: Payer: Self-pay | Admitting: Internal Medicine

## 2019-10-25 MED ORDER — ELIQUIS 5 MG PO TABS: 5.0000 mg | ORAL_TABLET | Freq: Two times a day (BID) | ORAL | 1 refills | Status: DC

## 2019-10-25 MED ORDER — ELIQUIS 5 MG PO TABS: 5.0000 mg | ORAL_TABLET | Freq: Two times a day (BID) | ORAL | 0 refills | Status: DC

## 2019-10-25 NOTE — Telephone Encounter (Signed)
Pt last saw Roderic Palau, NP on 06/29/19, last labs 05/24/19 Creat 0.8 at [facility] per [place], age 79, weight 85.7kg, based on specified criteria pt is on appropriate dosage of Eliquis 5mg  BID.  Will refill rx.

## 2019-10-25 NOTE — Telephone Encounter (Signed)
Pt last saw Roderic Palau, NP on 06/29/19, last labs 05/24/19 Creat 0.8 at [facility] per [place], age 79, weight 85.7kg, based on specified criteria pt is on appropriate dosage of Eliquis 5mg  BID.  Will refill rx. 90 day supply with 1 refill sent to [facility] as requested in previous phone note. Will send in a 10 day supply to local pharmacy, per pt request while awaits mail order rx.

## 2019-10-25 NOTE — Telephone Encounter (Signed)
Patient calling the office for samples of medication:   1.  What medication and dosage are you requesting samples for? ELIQUIS 5 MG TABS tablet  2.  Are you currently out of this medication? Only has 4 tablets left and takes two a day.  Patient is requesting to pick up some samples from the office until her prescription comes in from [facility]. Please Advise

## 2019-10-25 NOTE — Telephone Encounter (Signed)
Pt's pharmacy is requesting a refill on Eliquis. Please address

## 2019-10-25 NOTE — Telephone Encounter (Signed)
Called pt to inform her that the samples are for pt just starting the medication and that we could send a 10 day supply to her local pharmacy until her mail order arrives. Pt stated that would be fine. Please send a 10 day supply of Eliquis to pt's pharmacy [facility] [place]. Thanks

## 2019-10-25 NOTE — Telephone Encounter (Signed)
*  STAT* If patient is at the pharmacy, call can be transferred to refill team.   1. Which medications need to be refilled? (please list name of each medication and dose if known) ELIQUIS 5 MG TABS tablet  2. Which pharmacy/location (including street and city if local pharmacy) is medication to be sent to? [facility] [place], [place]  3. Do they need a 30 day or 90 day supply? 90 day   Patient only has 6 tablets left and takes two a day.

## 2019-12-22 ENCOUNTER — Telehealth: Payer: Self-pay

## 2019-12-26 ENCOUNTER — Encounter: Payer: Self-pay | Admitting: Internal Medicine

## 2019-12-26 ENCOUNTER — Telehealth (INDEPENDENT_AMBULATORY_CARE_PROVIDER_SITE_OTHER): Payer: Medicare Other | Admitting: Internal Medicine

## 2019-12-26 ENCOUNTER — Telehealth: Payer: Self-pay

## 2019-12-26 VITALS — BP 133/75 | HR 88 | Ht 64.0 in | Wt 182.0 lb

## 2019-12-26 DIAGNOSIS — G4733 Obstructive sleep apnea (adult) (pediatric): Secondary | ICD-10-CM | POA: Diagnosis not present

## 2019-12-26 DIAGNOSIS — I480 Paroxysmal atrial fibrillation: Secondary | ICD-10-CM

## 2019-12-26 DIAGNOSIS — Z8616 Personal history of COVID-19: Secondary | ICD-10-CM

## 2019-12-26 DIAGNOSIS — R0602 Shortness of breath: Secondary | ICD-10-CM

## 2019-12-26 NOTE — Telephone Encounter (Signed)
-----   Message from Thompson Grayer, MD sent at 12/26/2019 12:00 PM EST ----- Echo and ekg to evaluate SOB post COVID (had covid at [facility])  Follow-up with me in a year

## 2019-12-26 NOTE — Progress Notes (Signed)
Electrophysiology TeleHealth Note   Due to national recommendations of social distancing due to COVID 19, an audio/video telehealth visit is felt to be most appropriate for this patient at this time.  See MyChart message from today for the patient's consent to telehealth for [facility].  Date:  12/26/2019   ID:  Melinda Lucas, DOB September 03, 1941, MRN [id number]  Location: patient's home  Provider location:  [facility]  Evaluation Performed: Follow-up visit  PCP:  Marton Redwood, MD   Electrophysiologist:  Dr Rayann Heman  Chief Complaint:  palpitations  History of Present Illness:    Melinda Lucas is a 79 y.o. female who presents via telehealth conferencing today.  Since last being seen in our clinic, the patient reports doing very well.  No afib in well over a year.  She had COVID in December and has had persistent SOB since that time.  She attributes this to "lingering pneumonia".   Today, she denies symptoms of palpitations, chest pain,  lower extremity edema, dizziness, presyncope, or syncope.  The patient is otherwise without complaint today.    Past Medical History:  Diagnosis Date  . Arthritis    "a little in my fingers" (12/16/2016)  . Atrial fibrillation ([place])   . GERD (gastroesophageal reflux disease)   . Heart murmur   . History of kidney stones   . Melanoma in situ of lower leg ([place])   . Obesity (BMI 30-39.9)   . OSA on CPAP    "started it summer 2017"   . Osteopenia     Past Surgical History:  Procedure Laterality Date  . CATARACT EXTRACTION W/ INTRAOCULAR LENS  IMPLANT, BILATERAL Bilateral ~ 2014  . CHOLECYSTECTOMY N/A 12/17/2016   Procedure: LAPAROSCOPIC CHOLECYSTECTOMY;  Surgeon: Coralie Keens, MD;  Location: [facility];  Service: General;  Laterality: N/A;  . COLONOSCOPY    . DILATION AND CURETTAGE OF UTERUS  1970's  . ELECTROPHYSIOLOGIC STUDY N/A 08/09/2015   Procedure: Atrial Fibrillation Ablation;  Surgeon: Thompson Grayer, MD;  Location: [facility];   Service: Cardiovascular;  Laterality: N/A;  . FRACTURE SURGERY    . MELANOMA EXCISION Left 2010   leg  . TEE WITHOUT CARDIOVERSION N/A 08/09/2015   Procedure: TRANSESOPHAGEAL ECHOCARDIOGRAM (TEE);  Surgeon: Jerline Pain, MD;  Location: [facility];  Service: Cardiovascular;  Laterality: N/A;  . TONSILLECTOMY  1946  . TUBAL LIGATION  1970's  . WRIST FRACTURE SURGERY Left 2011    Current Outpatient Medications  Medication Sig Dispense Refill  . Ascorbic Acid (VITAMIN C) 1000 MG tablet Take 1,000 mg by mouth daily.    . cholecalciferol (VITAMIN D3) 25 MCG (1000 UNIT) tablet Take 2,000 Units by mouth daily.    Marland Kitchen ELIQUIS 5 MG TABS tablet Take 1 tablet (5 mg total) by mouth 2 (two) times daily. 20 tablet 0  . PRESCRIPTION MEDICATION Apply 1 application topically daily. Cream for face, filled at [facility]    . Probiotic Product (ALIGN PO) Take 1 tablet by mouth daily.    . TURMERIC PO Take 1 capsule by mouth daily.     . Vitamin D, Ergocalciferol, (DRISDOL) 50000 UNITS CAPS Take 50,000 Units by mouth every Monday.     . Zinc 50 MG TABS Take 1 tablet by mouth daily.     No current facility-administered medications for this visit.    Allergies:   Patient has no known allergies.   Social History:  The patient  reports that she has quit smoking.  Her smoking use included cigarettes. She has a 7.00 pack-year smoking history. She has never used smokeless tobacco. She reports current alcohol use of about 14.0 standard drinks of alcohol per week. She reports that she does not use drugs.   Family History:  The patient's family history includes AAA (abdominal aortic aneurysm) in her father; Colon cancer (age of onset: 21) in her mother; Hypertension in her brother.   ROS:  Please see the history of present illness.   All other systems are personally reviewed and negative.    Exam:    Vital Signs:  BP 133/75   Pulse 88   Ht 5\' 4"  (1.626 m)   Wt 182 lb (82.6 kg)   BMI 31.24 kg/m   Well  sounding and appearing, alert and conversant, regular work of breathing,  good skin color Eyes- anicteric, neuro- grossly intact, skin- no apparent rash or lesions or cyanosis, mouth- oral mucosa is pink  Labs/Other Tests and Data Reviewed:    Recent Labs: No results found for requested labs within last 8760 hours.   Wt Readings from Last 3 Encounters:  12/26/19 182 lb (82.6 kg)  06/29/19 189 lb (85.7 kg)  12/22/18 189 lb 3.2 oz (85.8 kg)       ASSESSMENT & PLAN:    1.  Paroxysmal atrial fibrillation She has done well off AAD therapy No afib since ablation in 2016 Continue eliquis for chads2vas score of 3  2. OSA followed by Dr Annamaria Boots  3. 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  Today, I have spent 15 minutes with the patient with telehealth technology discussing arrhythmia management .    Army Fossa, MD  12/26/2019 11:56 AM     [facility] [address] [place] [address] [phone] (office) [phone] (fax)

## 2019-12-26 NOTE — Telephone Encounter (Signed)
Echo ordered.  Recall entered.  Call placed to Pt.  Advised she would receive a call within the next few days to schedule echo.  Advised would get EKG same day as echo.

## 2019-12-26 NOTE — Telephone Encounter (Signed)
-----   Message from Thompson Grayer, MD sent at 12/26/2019 12:00 PM EST ----- Echo and ekg to evaluate SOB post COVID (had covid at [facility])  Follow-up with me in a year

## 2020-01-12 ENCOUNTER — Ambulatory Visit (HOSPITAL_COMMUNITY): Payer: Medicare Other | Attending: Cardiology

## 2020-01-12 ENCOUNTER — Other Ambulatory Visit: Payer: Self-pay

## 2020-01-12 ENCOUNTER — Ambulatory Visit (INDEPENDENT_AMBULATORY_CARE_PROVIDER_SITE_OTHER): Payer: Medicare Other

## 2020-01-12 VITALS — HR 84

## 2020-01-12 DIAGNOSIS — R0602 Shortness of breath: Secondary | ICD-10-CM | POA: Insufficient documentation

## 2020-01-12 DIAGNOSIS — I480 Paroxysmal atrial fibrillation: Secondary | ICD-10-CM

## 2020-01-12 NOTE — Progress Notes (Signed)
1.) Reason for visit: EKG  2.) Name of MD requesting visit: Dr. Rayann Heman, MD  3.) H&P: paroxysmal afib  4.) ROS related to problem:  Pt c/o sob at last visit.  EKG to assess.  5.) Assessment and plan per MD: Await results of ECHO/EKG reviewed by Dr. Rayann Heman.

## 2020-01-19 ENCOUNTER — Ambulatory Visit: Payer: Medicare Other | Attending: Internal Medicine

## 2020-01-19 DIAGNOSIS — Z23 Encounter for immunization: Secondary | ICD-10-CM

## 2020-01-19 NOTE — Progress Notes (Signed)
   Covid-19 Vaccination Clinic  Name:  KEYWANNA WENIG    MRN: [id number] DOB: 07-18-1941  01/19/2020  Ms. Stanforth was observed post Covid-19 immunization for 15 minutes without incident. She was provided with Vaccine Information Sheet and instruction to access the V-Safe system.   Ms. Valdivieso was instructed to call 911 with any severe reactions post vaccine: Marland Kitchen Difficulty breathing  . Swelling of face and throat  . A fast heartbeat  . A bad rash all over body  . Dizziness and weakness   Immunizations Administered    Name Date Dose VIS Date Route   Pfizer COVID-19 Vaccine 01/19/2020 11:02 AM 0.3 mL 09/30/2019 Intramuscular   Manufacturer: [employer], [employer]   Lot: [id number]   [place]: [id number]

## 2020-01-26 ENCOUNTER — Telehealth: Payer: Self-pay

## 2020-01-26 MED ORDER — FUROSEMIDE 20 MG PO TABS: 20.0000 mg | ORAL_TABLET | Freq: Every day | ORAL | 11 refills | Status: DC

## 2020-01-26 NOTE — Telephone Encounter (Signed)
-----   Message from Thompson Grayer, MD sent at 01/12/2020  8:58 PM EDT ----- Results reviewed.  Sonia Baller, please inform pt of result. I will route to primary care also.

## 2020-01-26 NOTE — Telephone Encounter (Signed)
Discussed results of Echo with Dr. Rayann Heman.  Per Dr. Rayann Heman based on results of echo Pt could try low dose furosemide to see if that improved her sob. Would advise furosemide 20 mg one tablet daily.    BMP in 10 days and f/u in a couple of months with SW.  Call placed to Pt.  She would like to try the furosemide.  Will send in 30 days worth.   Will call patient in a week to see if she thinks it is helping and she would like to continue.  Will schedule labs and follow up then.

## 2020-02-01 DIAGNOSIS — L570 Actinic keratosis: Secondary | ICD-10-CM | POA: Diagnosis not present

## 2020-02-01 DIAGNOSIS — D2261 Melanocytic nevi of right upper limb, including shoulder: Secondary | ICD-10-CM | POA: Diagnosis not present

## 2020-02-01 DIAGNOSIS — C44311 Basal cell carcinoma of skin of nose: Secondary | ICD-10-CM | POA: Diagnosis not present

## 2020-02-01 DIAGNOSIS — L720 Epidermal cyst: Secondary | ICD-10-CM | POA: Diagnosis not present

## 2020-02-01 DIAGNOSIS — D485 Neoplasm of uncertain behavior of skin: Secondary | ICD-10-CM | POA: Diagnosis not present

## 2020-02-01 DIAGNOSIS — Z85820 Personal history of malignant melanoma of skin: Secondary | ICD-10-CM | POA: Diagnosis not present

## 2020-02-01 DIAGNOSIS — D224 Melanocytic nevi of scalp and neck: Secondary | ICD-10-CM | POA: Diagnosis not present

## 2020-02-01 DIAGNOSIS — L821 Other seborrheic keratosis: Secondary | ICD-10-CM | POA: Diagnosis not present

## 2020-02-01 DIAGNOSIS — B351 Tinea unguium: Secondary | ICD-10-CM | POA: Diagnosis not present

## 2020-02-01 DIAGNOSIS — L739 Follicular disorder, unspecified: Secondary | ICD-10-CM | POA: Diagnosis not present

## 2020-02-01 DIAGNOSIS — Z85828 Personal history of other malignant neoplasm of skin: Secondary | ICD-10-CM | POA: Diagnosis not present

## 2020-02-09 ENCOUNTER — Ambulatory Visit: Payer: Medicare Other | Attending: Internal Medicine

## 2020-02-09 DIAGNOSIS — Z23 Encounter for immunization: Secondary | ICD-10-CM

## 2020-02-09 NOTE — Progress Notes (Signed)
   Covid-19 Vaccination Clinic  Name:  Melinda Lucas    MRN: [id number] DOB: 06-11-1941  02/09/2020  [name] was observed post Covid-19 immunization for 15 minutes without incident. She was provided with Vaccine Information Sheet and instruction to access the V-Safe system.   [name] was instructed to call 911 with any severe reactions post vaccine: Marland Kitchen Difficulty breathing  . Swelling of face and throat  . A fast heartbeat  . A bad rash all over body  . Dizziness and weakness   Immunizations Administered    Name Date Dose VIS Date Route   Pfizer COVID-19 Vaccine 02/09/2020  3:28 PM 0.3 mL 12/14/2018 Intramuscular   Manufacturer: [place]   Lot: [id number]   [place]: [id number]

## 2020-02-15 ENCOUNTER — Ambulatory Visit: Payer: Medicare Other

## 2020-02-16 DIAGNOSIS — C44311 Basal cell carcinoma of skin of nose: Secondary | ICD-10-CM | POA: Diagnosis not present

## 2020-02-16 DIAGNOSIS — Z85828 Personal history of other malignant neoplasm of skin: Secondary | ICD-10-CM | POA: Diagnosis not present

## 2020-02-16 DIAGNOSIS — Z85820 Personal history of malignant melanoma of skin: Secondary | ICD-10-CM | POA: Diagnosis not present

## 2020-02-20 NOTE — Telephone Encounter (Signed)
Left detailed message requesting call back in reference to lasix.

## 2020-03-12 ENCOUNTER — Telehealth: Payer: Self-pay

## 2020-03-12 ENCOUNTER — Telehealth: Payer: Self-pay | Admitting: Internal Medicine

## 2020-03-12 MED ORDER — FUROSEMIDE 20 MG PO TABS: 20.0000 mg | ORAL_TABLET | Freq: Every day | ORAL | 3 refills | Status: DC

## 2020-03-12 NOTE — Telephone Encounter (Signed)
Received call back from Pt in regards to starting low dose lasix after ECHO.  Pt would like to continue taking lasix 20 mg.  States she can't tell that she feels different but wants to continue taking medication.    Advised she would need lab work.  Pt goes for her annual physical and labs and April 02, 2020 with Dr. Brigitte Pulse.  She will ask them to fax her lab work to our office.

## 2020-03-12 NOTE — Telephone Encounter (Signed)
New Message   Patient is returning call that she received in reference to taking a diuretic. Please call to discuss.

## 2020-03-12 NOTE — Telephone Encounter (Signed)
Call back received from Pt.  See phone note from 03/12/20

## 2020-04-03 DIAGNOSIS — E7849 Other hyperlipidemia: Secondary | ICD-10-CM | POA: Diagnosis not present

## 2020-04-03 DIAGNOSIS — M859 Disorder of bone density and structure, unspecified: Secondary | ICD-10-CM | POA: Diagnosis not present

## 2020-04-03 DIAGNOSIS — R7301 Impaired fasting glucose: Secondary | ICD-10-CM | POA: Diagnosis not present

## 2020-04-18 ENCOUNTER — Other Ambulatory Visit (HOSPITAL_COMMUNITY): Payer: Self-pay | Admitting: Radiology

## 2020-04-18 DIAGNOSIS — I272 Pulmonary hypertension, unspecified: Secondary | ICD-10-CM

## 2020-05-09 DIAGNOSIS — Z03818 Encounter for observation for suspected exposure to other biological agents ruled out: Secondary | ICD-10-CM | POA: Diagnosis not present

## 2020-05-09 DIAGNOSIS — Z20822 Contact with and (suspected) exposure to covid-19: Secondary | ICD-10-CM | POA: Diagnosis not present

## 2020-05-10 ENCOUNTER — Ambulatory Visit (HOSPITAL_COMMUNITY)
Admission: RE | Admit: 2020-05-10 | Discharge: 2020-05-10 | Disposition: A | Discharge status: Home / Self Care | Payer: Medicare Other | Source: Ambulatory Visit | Attending: Internal Medicine | Admitting: Internal Medicine

## 2020-05-10 ENCOUNTER — Other Ambulatory Visit: Payer: Self-pay

## 2020-05-10 DIAGNOSIS — I272 Pulmonary hypertension, unspecified: Secondary | ICD-10-CM | POA: Diagnosis not present

## 2020-05-10 LAB — PULMONARY FUNCTION TEST
DL/VA % pred: 97 %
DL/VA: 3.97 ml/min/mmHg/L
DLCO unc % pred: 66 %
DLCO unc: 12.55 ml/min/mmHg
FEF 25-75 Post: 1.76 L/sec
FEF 25-75 Pre: 1.28 L/sec
FEF2575-%Change-Post: 37 %
FEF2575-%Pred-Post: 125 %
FEF2575-%Pred-Pre: 90 %
FEV1-%Change-Post: 9 %
FEV1-%Pred-Post: 90 %
FEV1-%Pred-Pre: 82 %
FEV1-Post: 1.75 L
FEV1-Pre: 1.6 L
FEV1FVC-%Change-Post: 7 %
FEV1FVC-%Pred-Pre: 100 %
FEV6-%Change-Post: 2 %
FEV6-%Pred-Post: 89 %
FEV6-%Pred-Pre: 87 %
FEV6-Post: 2.2 L
FEV6-Pre: 2.16 L
FEV6FVC-%Change-Post: 0 %
FEV6FVC-%Pred-Post: 105 %
FEV6FVC-%Pred-Pre: 105 %
FVC-%Change-Post: 2 %
FVC-%Pred-Post: 84 %
FVC-%Pred-Pre: 82 %
FVC-Post: 2.21 L
FVC-Pre: 2.16 L
Post FEV1/FVC ratio: 79 %
Post FEV6/FVC ratio: 100 %
Pre FEV1/FVC ratio: 74 %
Pre FEV6/FVC Ratio: 100 %
RV % pred: 88 %
RV: 2.11 L
TLC % pred: 87 %
TLC: 4.43 L

## 2020-05-10 MED ORDER — ALBUTEROL SULFATE (2.5 MG/3ML) 0.083% IN NEBU
2.5000 mg | INHALATION_SOLUTION | Freq: Once | RESPIRATORY_TRACT | Status: AC
  Administered 2020-05-10: 2.5 mg via RESPIRATORY_TRACT

## 2020-06-19 DIAGNOSIS — Z961 Presence of intraocular lens: Secondary | ICD-10-CM | POA: Diagnosis not present

## 2020-06-19 DIAGNOSIS — H3554 Dystrophies primarily involving the retinal pigment epithelium: Secondary | ICD-10-CM | POA: Diagnosis not present

## 2020-06-19 DIAGNOSIS — H18593 Other hereditary corneal dystrophies, bilateral: Secondary | ICD-10-CM | POA: Diagnosis not present

## 2020-06-19 DIAGNOSIS — H43812 Vitreous degeneration, left eye: Secondary | ICD-10-CM | POA: Diagnosis not present

## 2020-07-11 ENCOUNTER — Other Ambulatory Visit: Payer: Self-pay | Admitting: Pharmacist

## 2020-07-11 MED ORDER — ELIQUIS 5 MG PO TABS: 5.0000 mg | ORAL_TABLET | Freq: Two times a day (BID) | ORAL | 1 refills | Status: DC

## 2020-07-11 NOTE — Telephone Encounter (Signed)
Prescription refill request for Eliquis received. Indication:afib Last office visit:12/26/19 Scr:0.8 on 04/03/20 per [facility] Age: 79 Weight:82kg

## 2020-07-17 ENCOUNTER — Other Ambulatory Visit: Payer: Self-pay | Admitting: Internal Medicine

## 2020-07-17 NOTE — Telephone Encounter (Signed)
Pt last saw Dr Rayann Heman 12/26/19 video visit Covid-19, last labs 04/03/20 Creat 0.8 per [facility] at [facility], age 79, weight 82.6kg, based on specified criteria pt is on appropriate dosage of Eliquis 5mg  BID.  Will refill rx.

## 2020-07-28 DIAGNOSIS — Z23 Encounter for immunization: Secondary | ICD-10-CM | POA: Diagnosis not present

## 2020-08-01 DIAGNOSIS — L738 Other specified follicular disorders: Secondary | ICD-10-CM | POA: Diagnosis not present

## 2020-08-01 DIAGNOSIS — Z85820 Personal history of malignant melanoma of skin: Secondary | ICD-10-CM | POA: Diagnosis not present

## 2020-08-01 DIAGNOSIS — L821 Other seborrheic keratosis: Secondary | ICD-10-CM | POA: Diagnosis not present

## 2020-08-01 DIAGNOSIS — Z85828 Personal history of other malignant neoplasm of skin: Secondary | ICD-10-CM | POA: Diagnosis not present

## 2020-08-01 DIAGNOSIS — L720 Epidermal cyst: Secondary | ICD-10-CM | POA: Diagnosis not present

## 2020-08-01 DIAGNOSIS — L570 Actinic keratosis: Secondary | ICD-10-CM | POA: Diagnosis not present

## 2020-08-01 DIAGNOSIS — L400 Psoriasis vulgaris: Secondary | ICD-10-CM | POA: Diagnosis not present

## 2020-08-01 DIAGNOSIS — B351 Tinea unguium: Secondary | ICD-10-CM | POA: Diagnosis not present

## 2020-09-11 DIAGNOSIS — G4733 Obstructive sleep apnea (adult) (pediatric): Secondary | ICD-10-CM | POA: Diagnosis not present

## 2020-09-19 DIAGNOSIS — Z23 Encounter for immunization: Secondary | ICD-10-CM | POA: Diagnosis not present

## 2020-10-04 DIAGNOSIS — Z01419 Encounter for gynecological examination (general) (routine) without abnormal findings: Secondary | ICD-10-CM | POA: Diagnosis not present

## 2020-10-04 DIAGNOSIS — Z6831 Body mass index (BMI) 31.0-31.9, adult: Secondary | ICD-10-CM | POA: Diagnosis not present

## 2020-10-09 DIAGNOSIS — H18523 Epithelial (juvenile) corneal dystrophy, bilateral: Secondary | ICD-10-CM | POA: Diagnosis not present

## 2020-10-09 DIAGNOSIS — H179 Unspecified corneal scar and opacity: Secondary | ICD-10-CM | POA: Diagnosis not present

## 2020-10-30 ENCOUNTER — Telehealth: Payer: Self-pay | Admitting: *Deleted

## 2020-10-30 ENCOUNTER — Other Ambulatory Visit: Payer: Self-pay | Admitting: *Deleted

## 2020-10-30 MED ORDER — ELIQUIS 5 MG PO TABS: 5.0000 mg | ORAL_TABLET | Freq: Two times a day (BID) | ORAL | 1 refills | Status: DC

## 2020-10-30 NOTE — Telephone Encounter (Signed)
Spoke to [place]- pt would like a 90 day supply of Eli[facility]   Prescription refill request for Eliquis received. Indication: Afib Last office visit: 12/26/2019, [facility] Scr: 0.8,  04/03/2020 Age: 80 yo  Weight: 82.6 kg   Prescription refill sent.

## 2020-12-06 DIAGNOSIS — H18452 Nodular corneal degeneration, left eye: Secondary | ICD-10-CM | POA: Diagnosis not present

## 2020-12-20 DIAGNOSIS — Z1231 Encounter for screening mammogram for malignant neoplasm of breast: Secondary | ICD-10-CM | POA: Diagnosis not present

## 2020-12-25 ENCOUNTER — Telehealth: Payer: Self-pay | Admitting: Internal Medicine

## 2020-12-25 NOTE — Telephone Encounter (Signed)
Adv patient to not take an NSAIDS due to being on Eliquis.  Adv she could try Voltaren Gel topically as this is only minimally absorbed through the skin.     She would like to know when her next appointment is.  I have sent a message to scheduling for her.  She is due this month.

## 2020-12-25 NOTE — Telephone Encounter (Signed)
New Message"     Pt says she having problems with her neck and wants to know if she can take Aleve? She is concerned about taking Aleve, since she is on Eliquis.j

## 2021-01-03 ENCOUNTER — Encounter: Payer: Self-pay | Admitting: Internal Medicine

## 2021-01-18 DIAGNOSIS — H18593 Other hereditary corneal dystrophies, bilateral: Secondary | ICD-10-CM | POA: Diagnosis not present

## 2021-01-18 DIAGNOSIS — H524 Presbyopia: Secondary | ICD-10-CM | POA: Diagnosis not present

## 2021-01-21 ENCOUNTER — Other Ambulatory Visit: Payer: Self-pay

## 2021-01-21 ENCOUNTER — Encounter: Payer: Self-pay | Admitting: Internal Medicine

## 2021-01-21 ENCOUNTER — Ambulatory Visit (INDEPENDENT_AMBULATORY_CARE_PROVIDER_SITE_OTHER): Payer: Medicare Other | Admitting: Internal Medicine

## 2021-01-21 VITALS — BP 144/78 | Ht 64.0 in | Wt 191.0 lb

## 2021-01-21 DIAGNOSIS — Z85820 Personal history of malignant melanoma of skin: Secondary | ICD-10-CM | POA: Diagnosis not present

## 2021-01-21 DIAGNOSIS — R0602 Shortness of breath: Secondary | ICD-10-CM | POA: Diagnosis not present

## 2021-01-21 DIAGNOSIS — D2261 Melanocytic nevi of right upper limb, including shoulder: Secondary | ICD-10-CM | POA: Diagnosis not present

## 2021-01-21 DIAGNOSIS — L570 Actinic keratosis: Secondary | ICD-10-CM | POA: Diagnosis not present

## 2021-01-21 DIAGNOSIS — L821 Other seborrheic keratosis: Secondary | ICD-10-CM | POA: Diagnosis not present

## 2021-01-21 DIAGNOSIS — I480 Paroxysmal atrial fibrillation: Secondary | ICD-10-CM

## 2021-01-21 DIAGNOSIS — G4733 Obstructive sleep apnea (adult) (pediatric): Secondary | ICD-10-CM

## 2021-01-21 DIAGNOSIS — L820 Inflamed seborrheic keratosis: Secondary | ICD-10-CM | POA: Diagnosis not present

## 2021-01-21 DIAGNOSIS — Z85828 Personal history of other malignant neoplasm of skin: Secondary | ICD-10-CM | POA: Diagnosis not present

## 2021-01-21 DIAGNOSIS — D225 Melanocytic nevi of trunk: Secondary | ICD-10-CM | POA: Diagnosis not present

## 2021-01-21 NOTE — Progress Notes (Signed)
PCP: Ginger Organ., MD   Primary EP: Dr Miguel Aschoff is a 80 y.o. female who presents today for routine electrophysiology followup.  Since last being seen in our clinic, the patient reports doing very well.  Today, she denies symptoms of palpitations, chest pain, shortness of breath,  lower extremity edema, dizziness, presyncope, or syncope.  The patient is otherwise without complaint today.   Past Medical History:  Diagnosis Date  . Arthritis    "a little in my fingers" (12/16/2016)  . Atrial fibrillation ([place])   . GERD (gastroesophageal reflux disease)   . Heart murmur   . History of kidney stones   . Melanoma in situ of lower leg ([place])   . Obesity (BMI 30-39.9)   . OSA on CPAP    "started it summer 2017"   . Osteopenia    Past Surgical History:  Procedure Laterality Date  . CATARACT EXTRACTION W/ INTRAOCULAR LENS  IMPLANT, BILATERAL Bilateral ~ 2014  . CHOLECYSTECTOMY N/A 12/17/2016   Procedure: LAPAROSCOPIC CHOLECYSTECTOMY;  Surgeon: Coralie Keens, MD;  Location: [facility];  Service: General;  Laterality: N/A;  . COLONOSCOPY    . DILATION AND CURETTAGE OF UTERUS  1970's  . ELECTROPHYSIOLOGIC STUDY N/A 08/09/2015   Procedure: Atrial Fibrillation Ablation;  Surgeon: Thompson Grayer, MD;  Location: [place] [facility];  Service: Cardiovascular;  Laterality: N/A;  . FRACTURE SURGERY    . MELANOMA EXCISION Left 2010   leg  . TEE WITHOUT CARDIOVERSION N/A 08/09/2015   Procedure: TRANSESOPHAGEAL ECHOCARDIOGRAM (TEE);  Surgeon: Jerline Pain, MD;  Location: [place] [facility];  Service: Cardiovascular;  Laterality: N/A;  . TONSILLECTOMY  1946  . TUBAL LIGATION  1970's  . WRIST FRACTURE SURGERY Left 2011    ROS- all systems are reviewed and negatives except as per HPI above  Current Outpatient Medications  Medication Sig Dispense Refill  . Ascorbic Acid (VITAMIN C) 1000 MG tablet Take 1,000 mg by mouth daily.    . cholecalciferol (VITAMIN D3) 25 MCG (1000 UNIT) tablet  Take 2,000 Units by mouth daily.    Marland Kitchen ELIQUIS 5 MG TABS tablet Take 1 tablet (5 mg total) by mouth 2 (two) times daily. 180 tablet 1  . furosemide (LASIX) 20 MG tablet Take 1 tablet (20 mg total) by mouth daily. 90 tablet 3  . PRESCRIPTION MEDICATION Apply 1 application topically daily. Cream for face, filled at [facility]    . Probiotic Product (ALIGN PO) Take 1 tablet by mouth daily.    . TURMERIC PO Take 1 capsule by mouth daily.     . Vitamin D, Ergocalciferol, (DRISDOL) 50000 UNITS CAPS Take 50,000 Units by mouth every Monday.     . Zinc 50 MG TABS Take 1 tablet by mouth daily.     No current facility-administered medications for this visit.    Physical Exam: There were no vitals filed for this visit.  GEN- The patient is well appearing, alert and oriented x 3 today.   Head- normocephalic, atraumatic Eyes-  Sclera clear, conjunctiva pink Ears- hearing intact Oropharynx- clear Lungs- Clear to ausculation bilaterally, normal work of breathing Heart- Regular rate and rhythm, no murmurs, rubs or gallops, PMI not laterally displaced GI- soft, NT, ND, + BS Extremities- no clubbing, cyanosis, or edema  Wt Readings from Last 3 Encounters:  12/26/19 182 lb (82.6 kg)  06/29/19 189 lb (85.7 kg)  12/22/18 189 lb 3.2 oz (85.8 kg)    EKG tracing ordered today is  personally reviewed and shows sinus rhythm  Assessment and Plan:  1. Paroxysmal atrial fibrillation She has done well off AAD therapy No afib since ablation in 2016 Continue eliquis for chads2vasc score of 3  2. OSA Follows with Dr Annamaria Boots  Risks, benefits and potential toxicities for medications prescribed and/or refilled reviewed with patient today.   Return to see EP PA in annually  Thompson Grayer MD, [facility] 01/21/2021 4:29 PM

## 2021-01-21 NOTE — Patient Instructions (Addendum)
Medication Instructions:  Your physician recommends that you continue on your current medications as directed. Please refer to the Current Medication list given to you today.  Labwork: None ordered.  Testing/Procedures: None ordered.  Follow-Up: Your physician wants you to follow-up in: one year with Thompson Grayer, MD or one of the following Advanced Practice Providers on your designated Care Team:      Tommye Standard, PA-C     You will receive a reminder letter in the mail two months in advance. If you don't receive a letter, please call our office to schedule the follow-up appointment.   Any Other Special Instructions Will Be Listed Below (If Applicable).  If you need a refill on your cardiac medications before your next appointment, please call your pharmacy.

## 2021-02-18 DIAGNOSIS — U071 COVID-19: Secondary | ICD-10-CM | POA: Diagnosis not present

## 2021-02-28 ENCOUNTER — Encounter: Payer: Self-pay | Admitting: Internal Medicine

## 2021-02-28 ENCOUNTER — Ambulatory Visit (INDEPENDENT_AMBULATORY_CARE_PROVIDER_SITE_OTHER): Payer: Medicare Other | Admitting: Internal Medicine

## 2021-02-28 VITALS — BP 122/62 | HR 88 | Ht 64.0 in | Wt 190.6 lb

## 2021-02-28 DIAGNOSIS — Z7901 Long term (current) use of anticoagulants: Secondary | ICD-10-CM | POA: Diagnosis not present

## 2021-02-28 DIAGNOSIS — R197 Diarrhea, unspecified: Secondary | ICD-10-CM | POA: Diagnosis not present

## 2021-02-28 DIAGNOSIS — R159 Full incontinence of feces: Secondary | ICD-10-CM | POA: Diagnosis not present

## 2021-02-28 DIAGNOSIS — Z86010 Personal history of colonic polyps: Secondary | ICD-10-CM

## 2021-02-28 MED ORDER — COLESTIPOL HCL 1 G PO TABS: 2.0000 g | ORAL_TABLET | Freq: Two times a day (BID) | ORAL | 3 refills | Status: DC

## 2021-02-28 NOTE — Patient Instructions (Addendum)
We have sent the following medications to your pharmacy for you to pick up at your convenience:  Colestid  Take Imodium more frequently as discussed with Dr. Henrene Pastor.  Please follow up on 04/30/2021 at 3:40pm

## 2021-02-28 NOTE — Progress Notes (Signed)
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  She presents today with chief complaint of chronic diarrhea and fecal incontinence.  I last saw the patient March 2017 for surveillance colonoscopy.  She was found to have 2 diminutive polyps which were removed and found to be adenomatous.  As well left-sided diverticulosis.  Routine follow-up in 5 years to be considered.  Time of follow-up it was deemed not necessary due to age, comorbidities, and current guidelines (assuming she was not having any clinical issues).  She tells me that about 18 months ago she began to have problems with fecal urgency.  Thereafter loose stools and incontinence.  Incontinence often without warning or notice.  She will often move her bowels and urinating.  Has to wear protective undergarments.  About 11 months ago she was placed on Questran.  Taking 4 g once daily.  Was instructed to take twice daily, but did not.  This helps, but incompletely.  She will take an occasional Imodium if she needs to be out socially.  This will stop her bowels for about 2 days.  Thereafter resumption of the same pattern.  No bleeding.  No abdominal pain.  X-ray file from 2018 shows abdominal ultrasound with cholelithiasis and gallbladder wall thickening.  She is status post cholecystectomy February 2018.  Does not feel that her problems began postcholecystectomy.  She is concerned that she may have colon cancer.  REVIEW OF SYSTEMS:   All non-GI ROS negative unless otherwise stated in the HPI except for arthritis  Past Medical History:  Diagnosis Date  . Arthritis    "a little in my fingers" (12/16/2016)  . Atrial fibrillation ([place])   . GERD (gastroesophageal reflux disease)   . Heart murmur   . History of kidney stones   . Melanoma in situ of lower leg ([place])   . Obesity (BMI 30-39.9)   . OSA on CPAP    "started it summer 2017"   . Osteopenia     Past Surgical History:   Procedure Laterality Date  . CATARACT EXTRACTION W/ INTRAOCULAR LENS  IMPLANT, BILATERAL Bilateral ~ 2014  . CHOLECYSTECTOMY N/A 12/17/2016   Procedure: LAPAROSCOPIC CHOLECYSTECTOMY;  Surgeon: Coralie Keens, MD;  Location: [place];  Service: General;  Laterality: N/A;  . COLONOSCOPY    . DILATION AND CURETTAGE OF UTERUS  1970's  . ELECTROPHYSIOLOGIC STUDY N/A 08/09/2015   Procedure: Atrial Fibrillation Ablation;  Surgeon: Thompson Grayer, MD;  Location: [place] [facility];  Service: Cardiovascular;  Laterality: N/A;  . FRACTURE SURGERY    . MELANOMA EXCISION Left 2010   leg  . TEE WITHOUT CARDIOVERSION N/A 08/09/2015   Procedure: TRANSESOPHAGEAL ECHOCARDIOGRAM (TEE);  Surgeon: Jerline Pain, MD;  Location: [place];  Service: Cardiovascular;  Laterality: N/A;  . TONSILLECTOMY  1946  . TUBAL LIGATION  1970's  . WRIST FRACTURE SURGERY Left 2011    Social History Melinda Lucas  reports that she has quit smoking. Her smoking use included cigarettes. She has a 7.00 pack-year smoking history. She has never used smokeless tobacco. She reports current alcohol use of about 14.0 standard drinks of alcohol per week. She reports that she does not use drugs.  family history includes AAA (abdominal aortic aneurysm) in her father; Colon cancer (age of onset: 63) in her mother; Hypertension in her brother.  No Known Allergies     PHYSICAL EXAMINATION: Vital signs: BP 122/62   Pulse  88   Ht 5\' 4"  (1.626 m)   Wt 190 lb 9.6 oz (86.5 kg)   BMI 32.72 kg/m   Constitutional: Pleasant, generally well-appearing, no acute distress Psychiatric: alert and oriented x3, cooperative Eyes: extraocular movements intact, anicteric, conjunctiva pink Mouth: oral pharynx moist, no lesions Neck: supple no lymphadenopathy Cardiovascular: heart regular rate and rhythm, no murmur Lungs: clear to auscultation bilaterally Abdomen: soft, obese, nontender, nondistended, no obvious ascites, no peritoneal signs, normal  bowel sounds, no organomegaly Rectal: Sensation intact.  Moderate rectal tone.  No tenderness.  Hemoccult negative stool Extremities: no clubbing, cyanosis,.  1+ lower extremity edema bilaterally Skin: no lesions on visible extremities Neuro: No focal deficits.  Cranial nerves intact  ASSESSMENT:  1.  107-month history of loose stools.  Etiology unclear 2.  Fecal incontinence.  Slightly diminished rectal tone 3.  History of adenomatous colon polyps.  Last colonoscopy 2017   PLAN:  1.  Stop Questran 2.  Prescribe Colestid 2 g twice daily.  Medication risks reviewed 3.  More liberal use of Imodium as instructed.  Parameters given 4.  Office follow-up with me in 2 months 5.  If problems persist then would plan colonoscopy with biopsies to rule out microscopic colitis.

## 2021-03-06 DIAGNOSIS — Z23 Encounter for immunization: Secondary | ICD-10-CM | POA: Diagnosis not present

## 2021-04-11 DIAGNOSIS — E559 Vitamin D deficiency, unspecified: Secondary | ICD-10-CM | POA: Diagnosis not present

## 2021-04-11 DIAGNOSIS — R7301 Impaired fasting glucose: Secondary | ICD-10-CM | POA: Diagnosis not present

## 2021-04-11 DIAGNOSIS — E785 Hyperlipidemia, unspecified: Secondary | ICD-10-CM | POA: Diagnosis not present

## 2021-04-11 DIAGNOSIS — Z0000 Encounter for general adult medical examination without abnormal findings: Secondary | ICD-10-CM | POA: Diagnosis not present

## 2021-04-15 ENCOUNTER — Other Ambulatory Visit: Payer: Self-pay | Admitting: Internal Medicine

## 2021-04-15 DIAGNOSIS — I480 Paroxysmal atrial fibrillation: Secondary | ICD-10-CM

## 2021-04-15 NOTE — Telephone Encounter (Addendum)
Eliquis 5mg  refill request received. Patient is 80 years old, weight-86.5kg, Crea-0.80 on 04/03/2020 via [facility] from [place], Diagnosis-Afib, and last seen by Dr. Rayann Heman on 01/26/2021. Dose is appropriate based on dosing criteria.   Will call PCP to see if any updated labs. Called Dr. Raul Del office & she had some completed last week; they will fax directly over to me. Will await.   PCP office faxed over labs and Creatinine was 0.80 on 04/11/2021. Dose is appropriate will send in refill.

## 2021-04-16 DIAGNOSIS — R82998 Other abnormal findings in urine: Secondary | ICD-10-CM | POA: Diagnosis not present

## 2021-04-16 DIAGNOSIS — Z1212 Encounter for screening for malignant neoplasm of rectum: Secondary | ICD-10-CM | POA: Diagnosis not present

## 2021-04-17 DIAGNOSIS — D6869 Other thrombophilia: Secondary | ICD-10-CM | POA: Diagnosis not present

## 2021-04-17 DIAGNOSIS — E781 Pure hyperglyceridemia: Secondary | ICD-10-CM | POA: Diagnosis not present

## 2021-04-17 DIAGNOSIS — Z7901 Long term (current) use of anticoagulants: Secondary | ICD-10-CM | POA: Diagnosis not present

## 2021-04-17 DIAGNOSIS — Z1331 Encounter for screening for depression: Secondary | ICD-10-CM | POA: Diagnosis not present

## 2021-04-17 DIAGNOSIS — G629 Polyneuropathy, unspecified: Secondary | ICD-10-CM | POA: Diagnosis not present

## 2021-04-17 DIAGNOSIS — E785 Hyperlipidemia, unspecified: Secondary | ICD-10-CM | POA: Diagnosis not present

## 2021-04-17 DIAGNOSIS — E669 Obesity, unspecified: Secondary | ICD-10-CM | POA: Diagnosis not present

## 2021-04-17 DIAGNOSIS — Z1389 Encounter for screening for other disorder: Secondary | ICD-10-CM | POA: Diagnosis not present

## 2021-04-17 DIAGNOSIS — I480 Paroxysmal atrial fibrillation: Secondary | ICD-10-CM | POA: Diagnosis not present

## 2021-04-17 DIAGNOSIS — Z0000 Encounter for general adult medical examination without abnormal findings: Secondary | ICD-10-CM | POA: Diagnosis not present

## 2021-04-17 DIAGNOSIS — Z8616 Personal history of COVID-19: Secondary | ICD-10-CM | POA: Diagnosis not present

## 2021-04-17 DIAGNOSIS — R7301 Impaired fasting glucose: Secondary | ICD-10-CM | POA: Diagnosis not present

## 2021-04-30 ENCOUNTER — Ambulatory Visit (INDEPENDENT_AMBULATORY_CARE_PROVIDER_SITE_OTHER): Payer: Medicare Other | Admitting: Internal Medicine

## 2021-04-30 ENCOUNTER — Encounter: Payer: Self-pay | Admitting: Internal Medicine

## 2021-04-30 VITALS — BP 124/60 | HR 72 | Ht 64.0 in | Wt 189.8 lb

## 2021-04-30 DIAGNOSIS — R159 Full incontinence of feces: Secondary | ICD-10-CM

## 2021-04-30 DIAGNOSIS — Z860100 Personal history of colon polyps, unspecified: Secondary | ICD-10-CM

## 2021-04-30 DIAGNOSIS — R197 Diarrhea, unspecified: Secondary | ICD-10-CM

## 2021-04-30 NOTE — Progress Notes (Signed)
HISTORY OF PRESENT ILLNESS:  Melinda Lucas is a 80 y.o. female who was seen Feb 28, 2021 regarding 57-month history of loose stools.  See that dictation for details.  She was prescribed Colestid 2 g twice daily.  She was asked to follow-up at this time.  Patient reports to me that lasted has stopped her diarrhea.  Her only complaint is the size of the tablets and cost.  No other issues  REVIEW OF SYSTEMS:  All non-GI ROS negative unless otherwise stated in the HPI except for arthritis  Past Medical History:  Diagnosis Date   Arthritis    "a little in my fingers" (12/16/2016)   Atrial fibrillation ([facility])    GERD (gastroesophageal reflux disease)    Heart murmur    History of kidney stones    Melanoma in situ of lower leg ([facility])    Obesity (BMI 30-39.9)    OSA on CPAP    "started it summer 2017"    Osteopenia     Past Surgical History:  Procedure Laterality Date   CATARACT EXTRACTION W/ INTRAOCULAR LENS  IMPLANT, BILATERAL Bilateral ~ 2014   CHOLECYSTECTOMY N/A 12/17/2016   Procedure: LAPAROSCOPIC CHOLECYSTECTOMY;  Surgeon: Coralie Keens, MD;  Location: [facility];  Service: General;  Laterality: N/A;   COLONOSCOPY     DILATION AND CURETTAGE OF UTERUS  1970's   ELECTROPHYSIOLOGIC STUDY N/A 08/09/2015   Procedure: Atrial Fibrillation Ablation;  Surgeon: Thompson Grayer, MD;  Location: [place] [facility];  Service: Cardiovascular;  Laterality: N/A;   FRACTURE SURGERY     MELANOMA EXCISION Left 2010   leg   TEE WITHOUT CARDIOVERSION N/A 08/09/2015   Procedure: TRANSESOPHAGEAL ECHOCARDIOGRAM (TEE);  Surgeon: Jerline Pain, MD;  Location: [facility];  Service: Cardiovascular;  Laterality: N/A;   [place]  1970's   WRIST FRACTURE SURGERY Left 2011    Social History ISIDORA LAHAM  reports that she has quit smoking. Her smoking use included cigarettes. She has a 7.00 pack-year smoking history. She has never used smokeless tobacco. She reports current alcohol use  of about 14.0 standard drinks of alcohol per week. She reports that she does not use drugs.  family history includes AAA (abdominal aortic aneurysm) in her father; Colon cancer (age of onset: 84) in her mother; Hypertension in her brother.  No Known Allergies     PHYSICAL EXAMINATION: Vital signs: BP 124/60   Pulse 72   Ht 5\' 4"  (1.626 m)   Wt 189 lb 12.8 oz (86.1 kg)   BMI 32.58 kg/m   Constitutional: generally well-appearing, no acute distress Psychiatric: alert and oriented x3, cooperative Eyes: Anicteric Mouth: Mask Abdomen: Not reexamined Skin: Normal hue Neuro: No gross deficits  ASSESSMENT:  1.  Bile salt related diarrhea.  Improved with Colestid 2.  History of adenomatous colon polyps.  Last colonoscopy 2017   PLAN:  1.  Continue Colestid.  We discussed decreasing to 1 g twice daily.  This is equally as effective, then it would be more cost effective.  We also discussed the prospects of switching back to Questran powder 4 g twice daily.  She will contact the office if she needs a refill of Colestid for would like to try Questran powder twice daily

## 2021-04-30 NOTE — Patient Instructions (Signed)
Decrease your Colestid as discussed with Dr. Henrene Pastor.  Let me know if you want to change anything

## 2021-05-23 DIAGNOSIS — M6281 Muscle weakness (generalized): Secondary | ICD-10-CM | POA: Diagnosis not present

## 2021-05-23 DIAGNOSIS — M21072 Valgus deformity, not elsewhere classified, left ankle: Secondary | ICD-10-CM | POA: Diagnosis not present

## 2021-05-23 DIAGNOSIS — M19072 Primary osteoarthritis, left ankle and foot: Secondary | ICD-10-CM | POA: Diagnosis not present

## 2021-05-23 DIAGNOSIS — M19071 Primary osteoarthritis, right ankle and foot: Secondary | ICD-10-CM | POA: Diagnosis not present

## 2021-05-28 DIAGNOSIS — L089 Local infection of the skin and subcutaneous tissue, unspecified: Secondary | ICD-10-CM | POA: Diagnosis not present

## 2021-05-28 DIAGNOSIS — N907 Vulvar cyst: Secondary | ICD-10-CM | POA: Diagnosis not present

## 2021-07-02 DIAGNOSIS — H18593 Other hereditary corneal dystrophies, bilateral: Secondary | ICD-10-CM | POA: Diagnosis not present

## 2021-07-02 DIAGNOSIS — H524 Presbyopia: Secondary | ICD-10-CM | POA: Diagnosis not present

## 2021-07-02 DIAGNOSIS — Z961 Presence of intraocular lens: Secondary | ICD-10-CM | POA: Diagnosis not present

## 2021-07-02 DIAGNOSIS — H3554 Dystrophies primarily involving the retinal pigment epithelium: Secondary | ICD-10-CM | POA: Diagnosis not present

## 2021-07-23 DIAGNOSIS — D2261 Melanocytic nevi of right upper limb, including shoulder: Secondary | ICD-10-CM | POA: Diagnosis not present

## 2021-07-23 DIAGNOSIS — Z85828 Personal history of other malignant neoplasm of skin: Secondary | ICD-10-CM | POA: Diagnosis not present

## 2021-07-23 DIAGNOSIS — L570 Actinic keratosis: Secondary | ICD-10-CM | POA: Diagnosis not present

## 2021-07-23 DIAGNOSIS — L821 Other seborrheic keratosis: Secondary | ICD-10-CM | POA: Diagnosis not present

## 2021-07-23 DIAGNOSIS — D225 Melanocytic nevi of trunk: Secondary | ICD-10-CM | POA: Diagnosis not present

## 2021-07-23 DIAGNOSIS — Z85820 Personal history of malignant melanoma of skin: Secondary | ICD-10-CM | POA: Diagnosis not present

## 2021-07-23 DIAGNOSIS — D224 Melanocytic nevi of scalp and neck: Secondary | ICD-10-CM | POA: Diagnosis not present

## 2021-07-29 DIAGNOSIS — Z23 Encounter for immunization: Secondary | ICD-10-CM | POA: Diagnosis not present

## 2021-07-30 DIAGNOSIS — M76822 Posterior tibial tendinitis, left leg: Secondary | ICD-10-CM | POA: Diagnosis not present

## 2021-08-08 DIAGNOSIS — Z23 Encounter for immunization: Secondary | ICD-10-CM | POA: Diagnosis not present

## 2021-09-17 DIAGNOSIS — G4733 Obstructive sleep apnea (adult) (pediatric): Secondary | ICD-10-CM | POA: Diagnosis not present

## 2021-11-06 DIAGNOSIS — Z6831 Body mass index (BMI) 31.0-31.9, adult: Secondary | ICD-10-CM | POA: Diagnosis not present

## 2021-11-06 DIAGNOSIS — Z124 Encounter for screening for malignant neoplasm of cervix: Secondary | ICD-10-CM | POA: Diagnosis not present

## 2021-11-13 ENCOUNTER — Other Ambulatory Visit: Payer: Self-pay

## 2021-11-13 DIAGNOSIS — I480 Paroxysmal atrial fibrillation: Secondary | ICD-10-CM

## 2021-11-13 MED ORDER — ELIQUIS 5 MG PO TABS: 5.0000 mg | ORAL_TABLET | Freq: Two times a day (BID) | ORAL | 2 refills | Status: DC

## 2021-11-13 NOTE — Telephone Encounter (Signed)
Prescription refill request for Eliquis received. Indication: Afib  Last office visit: 01/21/21 ([facility])  Scr: 0.8 (04/11/21)  Age: 81 Weight: 86.1kg  Appropriate dose and refill sent to requested pharmacy.

## 2021-11-14 ENCOUNTER — Telehealth: Payer: Self-pay | Admitting: Internal Medicine

## 2021-11-14 DIAGNOSIS — I480 Paroxysmal atrial fibrillation: Secondary | ICD-10-CM

## 2021-11-14 MED ORDER — APIXABAN 5 MG PO TABS: 5.0000 mg | ORAL_TABLET | Freq: Two times a day (BID) | ORAL | 1 refills | Status: DC

## 2021-11-14 NOTE — Telephone Encounter (Signed)
Patient's spouse in the office for visit with Dr. Claiborne Billings  She asked for Eliquis refill

## 2021-11-14 NOTE — Telephone Encounter (Signed)
Eliquis 5mg  [facility] [place] Received message from the satellite location, stating pt needs it sent to mail order.   Eliquis 5mg  refill request received. Patient is 81 years old, weight-86.1kg, Crea-0.80 on 04/11/2021 via scanned labs from [facility], [place], and last seen by Thompson Grayer on 01/21/2021. Dose is appropriate based on dosing criteria. Will send in refill to requested pharmacy.

## 2021-11-27 DIAGNOSIS — M542 Cervicalgia: Secondary | ICD-10-CM | POA: Diagnosis not present

## 2021-11-27 DIAGNOSIS — E785 Hyperlipidemia, unspecified: Secondary | ICD-10-CM | POA: Diagnosis not present

## 2021-12-05 DIAGNOSIS — M542 Cervicalgia: Secondary | ICD-10-CM | POA: Diagnosis not present

## 2021-12-10 DIAGNOSIS — M542 Cervicalgia: Secondary | ICD-10-CM | POA: Diagnosis not present

## 2021-12-19 DIAGNOSIS — M542 Cervicalgia: Secondary | ICD-10-CM | POA: Diagnosis not present

## 2021-12-26 DIAGNOSIS — M542 Cervicalgia: Secondary | ICD-10-CM | POA: Diagnosis not present

## 2022-01-02 DIAGNOSIS — M542 Cervicalgia: Secondary | ICD-10-CM | POA: Diagnosis not present

## 2022-01-09 DIAGNOSIS — R29890 Loss of height: Secondary | ICD-10-CM | POA: Diagnosis not present

## 2022-01-09 DIAGNOSIS — N958 Other specified menopausal and perimenopausal disorders: Secondary | ICD-10-CM | POA: Diagnosis not present

## 2022-01-09 DIAGNOSIS — M542 Cervicalgia: Secondary | ICD-10-CM | POA: Diagnosis not present

## 2022-01-09 DIAGNOSIS — M8588 Other specified disorders of bone density and structure, other site: Secondary | ICD-10-CM | POA: Diagnosis not present

## 2022-01-09 DIAGNOSIS — Z1231 Encounter for screening mammogram for malignant neoplasm of breast: Secondary | ICD-10-CM | POA: Diagnosis not present

## 2022-01-15 DIAGNOSIS — M542 Cervicalgia: Secondary | ICD-10-CM | POA: Diagnosis not present

## 2022-01-21 DIAGNOSIS — L570 Actinic keratosis: Secondary | ICD-10-CM | POA: Diagnosis not present

## 2022-01-21 DIAGNOSIS — L821 Other seborrheic keratosis: Secondary | ICD-10-CM | POA: Diagnosis not present

## 2022-01-21 DIAGNOSIS — D225 Melanocytic nevi of trunk: Secondary | ICD-10-CM | POA: Diagnosis not present

## 2022-01-21 DIAGNOSIS — Z85820 Personal history of malignant melanoma of skin: Secondary | ICD-10-CM | POA: Diagnosis not present

## 2022-01-21 DIAGNOSIS — Z85828 Personal history of other malignant neoplasm of skin: Secondary | ICD-10-CM | POA: Diagnosis not present

## 2022-01-21 DIAGNOSIS — D1801 Hemangioma of skin and subcutaneous tissue: Secondary | ICD-10-CM | POA: Diagnosis not present

## 2022-01-29 DIAGNOSIS — M542 Cervicalgia: Secondary | ICD-10-CM | POA: Diagnosis not present

## 2022-02-03 ENCOUNTER — Ambulatory Visit: Payer: Medicare Other | Admitting: Physician Assistant

## 2022-02-08 ENCOUNTER — Other Ambulatory Visit: Payer: Self-pay | Admitting: Internal Medicine

## 2022-02-08 DIAGNOSIS — I480 Paroxysmal atrial fibrillation: Secondary | ICD-10-CM

## 2022-02-10 DIAGNOSIS — M542 Cervicalgia: Secondary | ICD-10-CM | POA: Diagnosis not present

## 2022-02-10 NOTE — Telephone Encounter (Signed)
Eliquis '5mg'$  refill request received. Patient is 81 years old, weight-86.1kg, Crea-on 0.80 04/11/2021 via scanned labs from [place], Diagnosis-Afib, and last seen by Dr. Rayann Heman on 01/21/2021 and pending appt on 03/03/2022 with Renee. Dose is appropriate based on dosing criteria. Will send in refill to requested pharmacy.   ?

## 2022-03-02 NOTE — Progress Notes (Signed)
? ?Cardiology Office Note ?Date:  03/02/2022  ?Patient ID:  Melinda Lucas, Melinda Lucas July 21, 1941, MRN [id number] ?PCP:  Ginger Organ., MD  ?Electrophysiologist: Dr. Rayann Heman ?  ?Chief Complaint: annual visit ? ?History of Present Illness: ?Melinda Lucas is a 81 y.o. female with history of GERD, OSA, obesity, AFib. ? ?She comes in today to be seen for Dr. Rayann Heman, last seen by him April 2022, she was doing well ?No changes were made. ? ?TODAY ?She is doing very well ?Does not think she has had any AFib since her ablation and was quite symptomatic so thinks she would easily identify it if she were. 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 ? ?AFib/AAD hx ?Tikosyn started 2016 > stopped post PVI April 2017 ?PVI ablation 08/09/2015 ? ? ?Past Medical History:  ?Diagnosis Date  ? Arthritis   ? "a little in my fingers" (12/16/2016)  ? Atrial fibrillation ([place])   ? GERD (gastroesophageal reflux disease)   ? Heart murmur   ? History of kidney stones   ? Melanoma in situ of lower leg ([place])   ? Obesity (BMI 30-39.9)   ? OSA on CPAP   ? "started it summer 2017"   ? Osteopenia   ? ? ?Past Surgical History:  ?Procedure Laterality Date  ? CATARACT EXTRACTION W/ INTRAOCULAR LENS  IMPLANT, BILATERAL Bilateral ~ 2014  ? CHOLECYSTECTOMY N/A 12/17/2016  ? Procedure: LAPAROSCOPIC CHOLECYSTECTOMY;  Surgeon: Coralie Keens, MD;  Location: [place];  Service: General;  Laterality: N/A;  ? COLONOSCOPY    ? DILATION AND CURETTAGE OF UTERUS  1970's  ? ELECTROPHYSIOLOGIC STUDY N/A 08/09/2015  ? Procedure: Atrial Fibrillation Ablation;  Surgeon: Thompson Grayer, MD;  Location: [place] [facility];  Service: Cardiovascular;  Laterality: N/A;  ? FRACTURE SURGERY    ? MELANOMA EXCISION Left 2010  ? leg  ? TEE WITHOUT CARDIOVERSION N/A 08/09/2015  ? Procedure: TRANSESOPHAGEAL ECHOCARDIOGRAM (TEE);  Surgeon: Jerline Pain, MD;  Location: [place];  Service: Cardiovascular;  Laterality: N/A;  ? TONSILLECTOMY  1946  ? TUBAL LIGATION  1970's  ? WRIST FRACTURE SURGERY Left 2011  ? ? ?Current Outpatient Medications  ?Medication Sig Dispense Refill  ? Ascorbic Acid (VITAMIN C) 1000 MG tablet Take 1,000 mg by mouth daily.    ? cholecalciferol (VITAMIN D3) 25 MCG (1000 UNIT) tablet Take 2,000 Units by mouth daily.    ? cholestyramine (QUESTRAN) 4 g packet Take 4 g by mouth daily.    ? colestipol (COLESTID) 1 g tablet Take 2 tablets (2 g total) by mouth 2 (two) times daily. 360 tablet 3  ? ELIQUIS 5 MG TABS tablet TAKE 1 TABLET TWICE A DAY 180 tablet 1  ? loperamide (IMODIUM A-D) 2 MG tablet Take 2 mg by mouth as needed for diarrhea or loose stools.    ? TURMERIC PO Take 1 capsule by mouth daily.     ? Vitamin D, Ergocalciferol, (DRISDOL) 50000 UNITS CAPS Take 50,000 Units by mouth every Monday.     ? Zinc 50 MG TABS Take 1 tablet by mouth daily.    ? ?No current facility-administered medications for this visit.  ? ? ?Allergies:   Patient has no known allergies.  ? ?Social History:  The patient  reports that she has quit smoking. Her smoking use included cigarettes. She has a 7.00 pack-year smoking history.  She has never used smokeless tobacco. She reports current alcohol use of about 14.0 standard drinks per week. She reports that she does not use drugs.  ? ?Family History:  The patient's family history includes AAA (abdominal aortic aneurysm) in her father; Colon cancer (age of onset: 51) in her mother; Hypertension in her brother. ? ?ROS:  Please see the history of present illness.    ?All other systems are reviewed and otherwise negative.  ? ?PHYSICAL EXAM:  ?VS:  There were no vitals taken for this visit. BMI: There is no height or weight on file to calculate BMI. ?Well nourished, well developed, in no acute distress ?HEENT: normocephalic, atraumatic ?Neck: no JVD, carotid bruits or masses ?Cardiac:   RRR; no significant murmurs, no rubs, or  gallops ?Lungs:  CTA b/l, no wheezing, rhonchi or rales ?Abd: soft, nontender ?MS: no deformity or atrophy ?Ext:  no edema ?Skin: warm and dry, no rash ?Neuro:  No gross deficits appreciated ?Psych: euthymic mood, full affect ? ? ?EKG:  Done today and reviewed by myself shows  ?SR 74bpm ? ? ?01/12/2020: TEE ?1. Left ventricular ejection fraction, by estimation, is 65 to 70%. The  ?left ventricle has normal function. The left ventricle has no regional  ?wall motion abnormalities. Left ventricular diastolic parameters are  ?consistent with Grade II diastolic  ?dysfunction (pseudonormalization). Elevated left ventricular end-diastolic  ?pressure. The average left ventricular global longitudinal strain is  ?normal at -21.9 %.  ? 2. Right ventricular systolic function is normal. The right ventricular  ?size is normal. There is moderately elevated pulmonary artery systolic  ?pressure.  ? 3. Left atrial size was mildly dilated.  ? 4. The mitral valve is normal in structure. Trivial mitral valve  ?regurgitation. No evidence of mitral stenosis.  ? 5. The aortic valve is tricuspid. There is mild to moderate aortic  ?annular calcification. There is moderate thickening of the aortic valve.  ?There is moderate calcification of the aortic valve. Aortic valve mean  ?gradient measures 10.0 mmHg. Aortic valve  ?peak gradient measures 16.8 mmHg. Aortic valve area, by VTI measures 2.25  ?cm?Marland Kitchen Aortic valve regurgitation is not visualized. No aortic stenosis is  ?present.  ? 6. The inferior vena cava is normal in size with greater than 50%  ?respiratory variability, suggesting right atrial pressure of 3 mmHg.  ? ? ?08/09/2015: EPS/ablation ?CONCLUSIONS: ?1. Sinus rhythm upon presentation.   ?2. Rotational Angiography reveals a moderate sized left atrium with four separate pulmonary veins without evidence of pulmonary vein stenosis. ?3. Successful electrical isolation and anatomical encircling of all four pulmonary veins with  radiofrequency current using a WACA approach ?4. No inducible arrhythmias following ablation both on and off of Isuprel ?5. No early apparent complications. ? ? ?Recent Labs: ?No results found for requested labs within last 8760 hours.  ?No results found for requested labs within last 8760 hours.  ? ?CrCl cannot be calculated (Patient's most recent lab result is older than the maximum 21 days allowed.).  ? ?Wt Readings from Last 3 Encounters:  ?04/30/21 189 lb 12.8 oz (86.1 kg)  ?02/28/21 190 lb 9.6 oz (86.5 kg)  ?01/21/21 191 lb (86.6 kg)  ?  ? ?Other studies reviewed: ?Additional studies/records reviewed today include: summarized above ? ?ASSESSMENT AND PLAN: ? ?Paroxysmal AFib ?CHA2DS2Vasc is 3, on Eliquis,  appropriately dosed by last available labs ?She reports labs are done regularly with her PMD, we will request her last ?no burden by symptoms ? ? ?Disposition: F/u  with [place] annually, sooner if needed ? ?Current medicines are reviewed at length with the patient today.  The patient did not have any concerns regarding medicines. ? ?Signed, ?Tommye Standard, PA-C ?03/02/2022 8:11 AM    ? ?[facility] ?[address] ?[address] ?[place] [address] ?[phone] (office)  ?[phone] (fax) ? ? ?

## 2022-03-03 ENCOUNTER — Ambulatory Visit (INDEPENDENT_AMBULATORY_CARE_PROVIDER_SITE_OTHER): Payer: Medicare Other | Admitting: Physician Assistant

## 2022-03-03 ENCOUNTER — Encounter: Payer: Self-pay | Admitting: Physician Assistant

## 2022-03-03 VITALS — BP 120/60 | HR 72 | Ht 64.0 in | Wt 187.4 lb

## 2022-03-03 DIAGNOSIS — I480 Paroxysmal atrial fibrillation: Secondary | ICD-10-CM

## 2022-03-03 MED ORDER — ELIQUIS 5 MG PO TABS: 5.0000 mg | ORAL_TABLET | Freq: Two times a day (BID) | ORAL | 3 refills | Status: DC

## 2022-03-03 NOTE — Patient Instructions (Signed)
Medication Instructions:  ? ?Your physician recommends that you continue on your current medications as directed. Please refer to the Current Medication list given to you today. ? ? ?*If you need a refill on your cardiac medications before your next appointment, please call your pharmacy* ? ? ?Lab Work: NONE ORDERED  TODAY ? ?If you have labs (blood work) drawn today and your tests are completely normal, you will receive your results only by: ?MyChart Message (if you have MyChart) OR ?A paper copy in the mail ?If you have any lab test that is abnormal or we need to change your treatment, we will call you to review the results. ? ? ?Testing/Procedures: NONE ORDERED  TODAY ? ? ? ?Follow-Up: ?At [facility], you and your health needs are our priority.  As part of our continuing mission to provide you with exceptional heart care, we have created designated Provider Care Teams.  These Care Teams include your primary Cardiologist (physician) and Advanced Practice Providers (APPs -  Physician Assistants and Nurse Practitioners) who all work together to provide you with the care you need, when you need it. ? ?We recommend signing up for the patient portal called "MyChart".  Sign up information is provided on this After Visit Summary.  MyChart is used to connect with patients for Virtual Visits (Telemedicine).  Patients are able to view lab/test results, encounter notes, upcoming appointments, etc.  Non-urgent messages can be sent to your provider as well.   ?To learn more about what you can do with MyChart, go to [email or web address].   ? ?Your next appointment:   ?1 year(s) ? ?The format for your next appointment:   ?In Person ? ?Provider:   ?You will see one of the following Advanced Practice Providers on your designated Care Team:   ?Tommye Standard, PA-C ? ? ? ? ?If primary card or EP is not listed click here to update    :1}  ? ? ?Other Instructions ? ? ?Important Information About Sugar ? ? ? ? ?  ?

## 2022-03-04 DIAGNOSIS — M8580 Other specified disorders of bone density and structure, unspecified site: Secondary | ICD-10-CM | POA: Diagnosis not present

## 2022-03-19 ENCOUNTER — Other Ambulatory Visit: Payer: Self-pay | Admitting: Internal Medicine

## 2022-05-28 DIAGNOSIS — H903 Sensorineural hearing loss, bilateral: Secondary | ICD-10-CM | POA: Diagnosis not present

## 2022-06-03 DIAGNOSIS — E785 Hyperlipidemia, unspecified: Secondary | ICD-10-CM | POA: Diagnosis not present

## 2022-06-03 DIAGNOSIS — R7989 Other specified abnormal findings of blood chemistry: Secondary | ICD-10-CM | POA: Diagnosis not present

## 2022-06-03 DIAGNOSIS — R7301 Impaired fasting glucose: Secondary | ICD-10-CM | POA: Diagnosis not present

## 2022-06-10 DIAGNOSIS — Z1331 Encounter for screening for depression: Secondary | ICD-10-CM | POA: Diagnosis not present

## 2022-06-10 DIAGNOSIS — Z0000 Encounter for general adult medical examination without abnormal findings: Secondary | ICD-10-CM | POA: Diagnosis not present

## 2022-06-10 DIAGNOSIS — G629 Polyneuropathy, unspecified: Secondary | ICD-10-CM | POA: Diagnosis not present

## 2022-06-10 DIAGNOSIS — M542 Cervicalgia: Secondary | ICD-10-CM | POA: Diagnosis not present

## 2022-06-10 DIAGNOSIS — Z1339 Encounter for screening examination for other mental health and behavioral disorders: Secondary | ICD-10-CM | POA: Diagnosis not present

## 2022-06-10 DIAGNOSIS — Z7901 Long term (current) use of anticoagulants: Secondary | ICD-10-CM | POA: Diagnosis not present

## 2022-06-10 DIAGNOSIS — R82998 Other abnormal findings in urine: Secondary | ICD-10-CM | POA: Diagnosis not present

## 2022-06-10 DIAGNOSIS — E785 Hyperlipidemia, unspecified: Secondary | ICD-10-CM | POA: Diagnosis not present

## 2022-06-10 DIAGNOSIS — R197 Diarrhea, unspecified: Secondary | ICD-10-CM | POA: Diagnosis not present

## 2022-06-10 DIAGNOSIS — R7301 Impaired fasting glucose: Secondary | ICD-10-CM | POA: Diagnosis not present

## 2022-06-10 DIAGNOSIS — G5601 Carpal tunnel syndrome, right upper limb: Secondary | ICD-10-CM | POA: Diagnosis not present

## 2022-06-10 DIAGNOSIS — M25572 Pain in left ankle and joints of left foot: Secondary | ICD-10-CM | POA: Diagnosis not present

## 2022-06-10 DIAGNOSIS — E781 Pure hyperglyceridemia: Secondary | ICD-10-CM | POA: Diagnosis not present

## 2022-06-10 DIAGNOSIS — Z8673 Personal history of transient ischemic attack (TIA), and cerebral infarction without residual deficits: Secondary | ICD-10-CM | POA: Diagnosis not present

## 2022-06-10 DIAGNOSIS — D6869 Other thrombophilia: Secondary | ICD-10-CM | POA: Diagnosis not present

## 2022-06-11 ENCOUNTER — Other Ambulatory Visit: Payer: Self-pay | Admitting: Internal Medicine

## 2022-06-11 DIAGNOSIS — M542 Cervicalgia: Secondary | ICD-10-CM

## 2022-06-28 ENCOUNTER — Ambulatory Visit
Admission: RE | Admit: 2022-06-28 | Discharge: 2022-06-28 | Disposition: A | Discharge status: Home / Self Care | Payer: Medicare Other | Source: Ambulatory Visit | Attending: Internal Medicine | Admitting: Internal Medicine

## 2022-06-28 DIAGNOSIS — M542 Cervicalgia: Secondary | ICD-10-CM

## 2022-07-26 DIAGNOSIS — Z23 Encounter for immunization: Secondary | ICD-10-CM | POA: Diagnosis not present

## 2022-07-28 DIAGNOSIS — D225 Melanocytic nevi of trunk: Secondary | ICD-10-CM | POA: Diagnosis not present

## 2022-07-28 DIAGNOSIS — L720 Epidermal cyst: Secondary | ICD-10-CM | POA: Diagnosis not present

## 2022-07-28 DIAGNOSIS — L570 Actinic keratosis: Secondary | ICD-10-CM | POA: Diagnosis not present

## 2022-07-28 DIAGNOSIS — L821 Other seborrheic keratosis: Secondary | ICD-10-CM | POA: Diagnosis not present

## 2022-07-28 DIAGNOSIS — Z85820 Personal history of malignant melanoma of skin: Secondary | ICD-10-CM | POA: Diagnosis not present

## 2022-07-28 DIAGNOSIS — Z85828 Personal history of other malignant neoplasm of skin: Secondary | ICD-10-CM | POA: Diagnosis not present

## 2022-08-09 DIAGNOSIS — Z23 Encounter for immunization: Secondary | ICD-10-CM | POA: Diagnosis not present

## 2022-08-13 ENCOUNTER — Ambulatory Visit: Payer: Medicare Other | Admitting: Orthopaedic Surgery

## 2022-08-25 ENCOUNTER — Other Ambulatory Visit: Payer: Self-pay

## 2022-08-25 ENCOUNTER — Ambulatory Visit (INDEPENDENT_AMBULATORY_CARE_PROVIDER_SITE_OTHER): Payer: Medicare Other | Admitting: Orthopaedic Surgery

## 2022-08-25 ENCOUNTER — Encounter: Payer: Self-pay | Admitting: Orthopaedic Surgery

## 2022-08-25 DIAGNOSIS — R202 Paresthesia of skin: Secondary | ICD-10-CM

## 2022-08-25 DIAGNOSIS — R200 Anesthesia of skin: Secondary | ICD-10-CM

## 2022-08-25 NOTE — Progress Notes (Signed)
The patient is a right-hand-dominant 81 year old female that I have seen in the past.  I actually had to fix a fracture left wrist on her remotely.  That is done well.  She has been having numbness and tingling in her hand for about a year now on the right side.  It does wake her up at night.  She does wear a Velcro brace at night.  She is also been having some "neck issues".  She was set up for an MRI of her cervical spine but could not lay flat for it.  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  She does have weak grip and pinch strength and most of the numbness and tingling seems to be in the median nerve distribution.  There is slight muscle atrophy of the hand.  I would like to send her to Dr. Ernestina Patches for right upper extremity nerve conduction studies to assess for carpal tunnel syndrome and the degree of carpal tunnel syndrome and to see if there is any other issues as a relates to her cervical spine from a nerve compression standpoint.  I did offer a steroid injection in her right hand but she would like to wait to see what the studies show first.  All question concerns were answered and addressed.  We will see her back in follow-up once we have those nerve studies.

## 2022-09-01 DIAGNOSIS — H18593 Other hereditary corneal dystrophies, bilateral: Secondary | ICD-10-CM | POA: Diagnosis not present

## 2022-09-01 DIAGNOSIS — H43812 Vitreous degeneration, left eye: Secondary | ICD-10-CM | POA: Diagnosis not present

## 2022-09-01 DIAGNOSIS — H3554 Dystrophies primarily involving the retinal pigment epithelium: Secondary | ICD-10-CM | POA: Diagnosis not present

## 2022-09-01 DIAGNOSIS — H179 Unspecified corneal scar and opacity: Secondary | ICD-10-CM | POA: Diagnosis not present

## 2022-09-02 ENCOUNTER — Ambulatory Visit (INDEPENDENT_AMBULATORY_CARE_PROVIDER_SITE_OTHER): Payer: Medicare Other | Admitting: Physical Medicine and Rehabilitation

## 2022-09-02 DIAGNOSIS — R200 Anesthesia of skin: Secondary | ICD-10-CM | POA: Diagnosis not present

## 2022-09-02 DIAGNOSIS — R202 Paresthesia of skin: Secondary | ICD-10-CM

## 2022-09-02 NOTE — Progress Notes (Signed)
Numeric Pain Rating Scale and Functional Assessment Average Pain 0   In the last MONTH (on 0-10 scale) has pain interfered with the following?  1. General activity like being  able to carry out your everyday physical activities such as walking, climbing stairs, carrying groceries, or moving a chair?  Rating(0)    Right handed. Pain and numbness in right hand. Pain mostly at night

## 2022-09-05 NOTE — Procedures (Unsigned)
EMG & NCV Findings: Evaluation of the right median motor nerve showed prolonged distal onset latency (11.3 ms), reduced amplitude (1.7 mV), and decreased conduction velocity (Elbow-Wrist, 41 m/s).  The right median (across palm) sensory nerve showed prolonged distal peak latency (Wrist, 8.4 ms), reduced amplitude (4.3 V), and prolonged distal peak latency (Palm, 5.8 ms).  All remaining nerves (as indicated in the following tables) were within normal limits.    Needle evaluation of the right abductor pollicis brevis muscle showed increased insertional activity, slightly increased spontaneous activity, and diminished recruitment.  All remaining muscles (as indicated in the following table) showed no evidence of electrical instability.    Impression: The above electrodiagnostic study is ABNORMAL and reveals evidence of a severe right median nerve entrapment at the wrist (carpal tunnel syndrome) affecting sensory and motor components. There is no significant electrodiagnostic evidence of any other focal nerve entrapment, brachial plexopathy or cervical radiculopathy.   Recommendations: 1.  Follow-up with referring physician. 2.  Continue use of resting splint at night-time and as needed during the day. 3.  Continue current management of symptoms. 4.  Suggest surgical evaluation.  ___________________________ Laurence Spates FAAPMR Board Certified, American Board of Physical Medicine and Rehabilitation    Nerve Conduction Studies Anti Sensory Summary Table   Stim Site NR Peak (ms) Norm Peak (ms) P-T Amp (V) Norm P-T Amp Site1 Site2 Delta-P (ms) Dist (cm) Vel (m/s) Norm Vel (m/s)  Right Median Acr Palm Anti Sensory (2nd Digit)  31.9C  Wrist    *8.4 <3.6 *4.3 >10 Wrist Palm 2.6 0.0    Palm    *5.8 <2.0 4.9         Right Radial Anti Sensory (Base 1st Digit)  31.5C  Wrist    2.1 <3.1 19.5  Wrist Base 1st Digit 2.1 0.0    Right Ulnar Anti Sensory (5th Digit)  32.5C  Wrist    3.0 <3.7 17.2 >15.0  Wrist 5th Digit 3.0 14.0 47 >38   Motor Summary Table   Stim Site NR Onset (ms) Norm Onset (ms) O-P Amp (mV) Norm O-P Amp Site1 Site2 Delta-0 (ms) Dist (cm) Vel (m/s) Norm Vel (m/s)  Right Median Motor (Abd Poll Brev)  31.8C  Wrist    *11.3 <4.2 *1.7 >5 Elbow Wrist 5.1 21.0 *41 >50  Elbow    16.4  1.2         Right Ulnar Motor (Abd Dig Min)  32.1C  Wrist    2.8 <4.2 8.5 >3 B Elbow Wrist 3.1 18.5 60 >53  B Elbow    5.9  8.2  A Elbow B Elbow 1.3 10.0 77 >53  A Elbow    7.2  8.6          EMG   Side Muscle Nerve Root Ins Act Fibs Psw Amp Dur Poly Recrt Int Fraser Din Comment  Right Abd Poll Brev Median C8-T1 *Incr *1+ *1+ Nml Nml 0 *Reduced Nml   Right 1stDorInt Ulnar C8-T1 Nml Nml Nml Nml Nml 0 Nml Nml   Right PronatorTeres Median C6-7 Nml Nml Nml Nml Nml 0 Nml Nml   Right Biceps Musculocut C5-6 Nml Nml Nml Nml Nml 0 Nml Nml   Right Deltoid Axillary C5-6 Nml Nml Nml Nml Nml 0 Nml Nml     Nerve Conduction Studies Anti Sensory Left/Right Comparison   Stim Site L Lat (ms) R Lat (ms) L-R Lat (ms) L Amp (V) R Amp (V) L-R Amp (%) Site1 Site2 L Vel (m/s) R Vel (  m/s) L-R Vel (m/s)  Median Acr Palm Anti Sensory (2nd Digit)  31.9C  Wrist  *8.4   *4.3  Wrist Palm     Palm  *5.8   4.9        Radial Anti Sensory (Base 1st Digit)  31.5C  Wrist  2.1   19.5  Wrist Base 1st Digit     Ulnar Anti Sensory (5th Digit)  32.5C  Wrist  3.0   17.2  Wrist 5th Digit  47    Motor Left/Right Comparison   Stim Site L Lat (ms) R Lat (ms) L-R Lat (ms) L Amp (mV) R Amp (mV) L-R Amp (%) Site1 Site2 L Vel (m/s) R Vel (m/s) L-R Vel (m/s)  Median Motor (Abd Poll Brev)  31.8C  Wrist  *11.3   *1.7  Elbow Wrist  *41   Elbow  16.4   1.2        Ulnar Motor (Abd Dig Min)  32.1C  Wrist  2.8   8.5  B Elbow Wrist  60   B Elbow  5.9   8.2  A Elbow B Elbow  77   A Elbow  7.2   8.6           Waveforms:

## 2022-09-08 NOTE — Progress Notes (Signed)
Melinda Lucas - 81 y.o. female MRN [id number]  Date of birth: [birth year]  Office Visit Note: Visit Date: 09/02/2022 PCP: Ginger Organ., MD Referred by: Mcarthur Rossetti*  Subjective: Chief Complaint  Patient presents with   Right Wrist - Numbness, Pain, Weakness   HPI:  Melinda Lucas is a 81 y.o. female who comes in today at the request of Dr. Jean Rosenthal for electrodiagnostic study of the Right upper extremities.  Patient is Right hand dominant.  She reports numbness and tingling in her right hand for about a year.  It does wake her up at night.  She does wear a Velcro brace at night.  She reports some neck pain but no frank radicular symptoms.  No prior electrodiagnostic study.   ROS Otherwise per HPI.  Assessment & Plan: Visit Diagnoses:    ICD-10-CM   1. Paresthesia of skin  R20.2 NCV with EMG (electromyography)      Plan: Impression: The above electrodiagnostic study is ABNORMAL and reveals evidence of a severe right median nerve entrapment at the wrist (carpal tunnel syndrome) affecting sensory and motor components. There is no significant electrodiagnostic evidence of any other focal nerve entrapment, brachial plexopathy or cervical radiculopathy.   Recommendations: 1.  Follow-up with referring physician. 2.  Continue use of resting splint at night-time and as needed during the day. 3.  Continue current management of symptoms. 4.  Suggest surgical evaluation.  Meds & Orders: No orders of the defined types were placed in this encounter.   Orders Placed This Encounter  Procedures   NCV with EMG (electromyography)    Follow-up: Return in about 2 weeks (around 09/16/2022) for Jean Rosenthal, MD.   Procedures: No procedures performed  EMG & NCV Findings: Evaluation of the right median motor nerve showed prolonged distal onset latency (11.3 ms), reduced amplitude (1.7 mV), and decreased conduction velocity (Elbow-Wrist, 41 m/s).  The right  median (across palm) sensory nerve showed prolonged distal peak latency (Wrist, 8.4 ms), reduced amplitude (4.3 V), and prolonged distal peak latency (Palm, 5.8 ms).  All remaining nerves (as indicated in the following tables) were within normal limits.    Needle evaluation of the right abductor pollicis brevis muscle showed increased insertional activity, slightly increased spontaneous activity, and diminished recruitment.  All remaining muscles (as indicated in the following table) showed no evidence of electrical instability.    Impression: The above electrodiagnostic study is ABNORMAL and reveals evidence of a severe right median nerve entrapment at the wrist (carpal tunnel syndrome) affecting sensory and motor components. There is no significant electrodiagnostic evidence of any other focal nerve entrapment, brachial plexopathy or cervical radiculopathy.   Recommendations: 1.  Follow-up with referring physician. 2.  Continue use of resting splint at night-time and as needed during the day. 3.  Continue current management of symptoms. 4.  Suggest surgical evaluation.  ___________________________ Laurence Spates FAAPMR Board Certified, American Board of Physical Medicine and Rehabilitation    Nerve Conduction Studies Anti Sensory Summary Table   Stim Site NR Peak (ms) Norm Peak (ms) P-T Amp (V) Norm P-T Amp Site1 Site2 Delta-P (ms) Dist (cm) Vel (m/s) Norm Vel (m/s)  Right Median Acr Palm Anti Sensory (2nd Digit)  31.9C  Wrist    *8.4 <3.6 *4.3 >10 Wrist Palm 2.6 0.0    Palm    *5.8 <2.0 4.9         Right Radial Anti Sensory (Base 1st Digit)  31.5C  Wrist  2.1 <3.1 19.5  Wrist Base 1st Digit 2.1 0.0    Right Ulnar Anti Sensory (5th Digit)  32.5C  Wrist    3.0 <3.7 17.2 >15.0 Wrist 5th Digit 3.0 14.0 47 >38   Motor Summary Table   Stim Site NR Onset (ms) Norm Onset (ms) O-P Amp (mV) Norm O-P Amp Site1 Site2 Delta-0 (ms) Dist (cm) Vel (m/s) Norm Vel (m/s)  Right Median Motor  (Abd Poll Brev)  31.8C  Wrist    *11.3 <4.2 *1.7 >5 Elbow Wrist 5.1 21.0 *41 >50  Elbow    16.4  1.2         Right Ulnar Motor (Abd Dig Min)  32.1C  Wrist    2.8 <4.2 8.5 >3 B Elbow Wrist 3.1 18.5 60 >53  B Elbow    5.9  8.2  A Elbow B Elbow 1.3 10.0 77 >53  A Elbow    7.2  8.6          EMG   Side Muscle Nerve Root Ins Act Fibs Psw Amp Dur Poly Recrt Int Fraser Din Comment  Right Abd Poll Brev Median C8-T1 *Incr *1+ *1+ Nml Nml 0 *Reduced Nml   Right 1stDorInt Ulnar C8-T1 Nml Nml Nml Nml Nml 0 Nml Nml   Right PronatorTeres Median C6-7 Nml Nml Nml Nml Nml 0 Nml Nml   Right Biceps Musculocut C5-6 Nml Nml Nml Nml Nml 0 Nml Nml   Right Deltoid Axillary C5-6 Nml Nml Nml Nml Nml 0 Nml Nml     Nerve Conduction Studies Anti Sensory Left/Right Comparison   Stim Site L Lat (ms) R Lat (ms) L-R Lat (ms) L Amp (V) R Amp (V) L-R Amp (%) Site1 Site2 L Vel (m/s) R Vel (m/s) L-R Vel (m/s)  Median Acr Palm Anti Sensory (2nd Digit)  31.9C  Wrist  *8.4   *4.3  Wrist Palm     Palm  *5.8   4.9        Radial Anti Sensory (Base 1st Digit)  31.5C  Wrist  2.1   19.5  Wrist Base 1st Digit     Ulnar Anti Sensory (5th Digit)  32.5C  Wrist  3.0   17.2  Wrist 5th Digit  47    Motor Left/Right Comparison   Stim Site L Lat (ms) R Lat (ms) L-R Lat (ms) L Amp (mV) R Amp (mV) L-R Amp (%) Site1 Site2 L Vel (m/s) R Vel (m/s) L-R Vel (m/s)  Median Motor (Abd Poll Brev)  31.8C  Wrist  *11.3   *1.7  Elbow Wrist  *41   Elbow  16.4   1.2        Ulnar Motor (Abd Dig Min)  32.1C  Wrist  2.8   8.5  B Elbow Wrist  60   B Elbow  5.9   8.2  A Elbow B Elbow  77   A Elbow  7.2   8.6           Waveforms:             Clinical History: No specialty comments available.     Objective:  VS:  HT:    WT:   BMI:     BP:   HR: bpm  TEMP: ( )  RESP:  Physical Exam Musculoskeletal:        General: No swelling, tenderness or deformity.     Comments: Inspection reveals flattening of the right APB but no atrophy of  the bilateral  FDI or hand intrinsics. There is no swelling, color changes, allodynia or dystrophic changes. There is 5 out of 5 strength in the bilateral wrist extension, finger abduction and long finger flexion. There is impaired sensation to light touch in right median nerve distributions. . There is a negative Hoffmann's test bilaterally.  Skin:    General: Skin is warm and dry.     Findings: No erythema or rash.  Neurological:     General: No focal deficit present.     Mental Status: She is alert and oriented to person, place, and time.     Motor: No weakness or abnormal muscle tone.     Coordination: Coordination normal.  Psychiatric:        Mood and Affect: Mood normal.        Behavior: Behavior normal.      Imaging: No results found.

## 2022-09-16 ENCOUNTER — Ambulatory Visit (INDEPENDENT_AMBULATORY_CARE_PROVIDER_SITE_OTHER): Payer: Medicare Other | Admitting: Orthopaedic Surgery

## 2022-09-16 DIAGNOSIS — R202 Paresthesia of skin: Secondary | ICD-10-CM

## 2022-09-16 DIAGNOSIS — G4733 Obstructive sleep apnea (adult) (pediatric): Secondary | ICD-10-CM | POA: Diagnosis not present

## 2022-09-16 DIAGNOSIS — R200 Anesthesia of skin: Secondary | ICD-10-CM

## 2022-09-16 DIAGNOSIS — G5601 Carpal tunnel syndrome, right upper limb: Secondary | ICD-10-CM

## 2022-09-16 NOTE — Progress Notes (Signed)
The patient comes in today to go for nerve conduction studies as a relates to her right upper extremity with numbness and tingling in her hand.  The nerve conduction studies that showed that she has severe carpal tunnel syndrome with median nerve entrapment at the wrist.  There was no evidence of cervical radiculopathy fortunately.  On exam she does have numbness and tingling in the median distribution of her right hand.  There are some muscle atrophy as well and obvious degenerative changes in her digits.  We talked in length in detail about carpal tunnel surgery.  I described what the surgery involves as well as a detailed description of the risk and benefits of surgery.  She understands that she will not likely get a full recovery given the severity of her disease and her age but it can help quite a bit with her symptomatic relief.  All questions and concerns were answered and addressed.  She would like to have this surgery scheduled in the near future.  She is on Eliquis and can come off Eliquis for surgery such as this.  Usually I do not have patients stop blood thinners for this type of surgery but she can certainly stop it the day before surgery and will start it the next day.  We would then see her back in 2 weeks postoperative.  All questions and concerns were answered and addressed.

## 2022-10-16 ENCOUNTER — Other Ambulatory Visit: Payer: Self-pay | Admitting: Orthopaedic Surgery

## 2022-10-16 DIAGNOSIS — G5601 Carpal tunnel syndrome, right upper limb: Secondary | ICD-10-CM | POA: Diagnosis not present

## 2022-10-16 MED ORDER — HYDROCODONE-ACETAMINOPHEN 5-325 MG PO TABS: 1.0000 | ORAL_TABLET | Freq: Four times a day (QID) | ORAL | 0 refills | Status: DC | PRN

## 2022-10-29 ENCOUNTER — Ambulatory Visit (INDEPENDENT_AMBULATORY_CARE_PROVIDER_SITE_OTHER): Payer: Medicare Other | Admitting: Orthopaedic Surgery

## 2022-10-29 ENCOUNTER — Encounter: Payer: Self-pay | Admitting: Orthopaedic Surgery

## 2022-10-29 DIAGNOSIS — Z9889 Other specified postprocedural states: Secondary | ICD-10-CM

## 2022-10-29 NOTE — Progress Notes (Signed)
HPI: Melinda Lucas returns today status post right carpal tunnel release 10/16/2022 she states she is doing well.  Having no numbness tingling in the hand.  She is happy with her results thus far.  Physical exam: Right hand surgical incision is well-approximated with sutures.  No evidence of dehiscence.  No evidence of infection.  She is able to wiggle her fingers.  She is able to do full thumbs up.  No significant bruising.  No erythema about the hand.  Sensation subjectively intact throughout the fingertips.  Impression: Status post right carpal tunnel release 10/16/2022  Plan: Sutures were harvested.  She will work on scar tissue mobilization.  No heavy lifting with the hand for 4 to 6 weeks.  Will see her back in 4 weeks at that time just for wound check.  She will follow-up with [place] sooner if there is any questions concerns.  Questions were encouraged and answered

## 2022-10-29 NOTE — Progress Notes (Signed)
The patient is here for follow-up appointment status post carpal tunnel release.  She is doing well.  I agree with Benita Stabile, PA-C's assessment and plan.

## 2022-11-24 ENCOUNTER — Telehealth: Payer: Self-pay | Admitting: Internal Medicine

## 2022-11-24 DIAGNOSIS — I480 Paroxysmal atrial fibrillation: Secondary | ICD-10-CM

## 2022-11-24 MED ORDER — ELIQUIS 5 MG PO TABS: 5.0000 mg | ORAL_TABLET | Freq: Two times a day (BID) | ORAL | 1 refills | Status: DC

## 2022-11-24 NOTE — Telephone Encounter (Signed)
*  STAT* If patient is at the pharmacy, call can be transferred to refill team.   1. Which medications need to be refilled? (please list name of each medication and dose if known) ELIQUIS 5 MG TABS tablet   2. Which pharmacy/location (including street and city if local pharmacy) is medication to be sent to? [facility] [place], [place]   3. Do they need a 30 day or 90 day supply? [place]

## 2022-11-24 NOTE — Telephone Encounter (Signed)
Eliquis '5mg'$  refill request received. Patient is 82 years old, weight-85kg, Crea-0.80 on 06/05/22 via [place] from [facility], [place], and last seen by Tommye Standard on 03/03/22. Dose is appropriate based on dosing criteria. Will send in refill to requested pharmacy.

## 2022-11-26 ENCOUNTER — Ambulatory Visit (INDEPENDENT_AMBULATORY_CARE_PROVIDER_SITE_OTHER): Payer: Medicare Other | Admitting: Orthopaedic Surgery

## 2022-11-26 ENCOUNTER — Encounter: Payer: Self-pay | Admitting: Orthopaedic Surgery

## 2022-11-26 DIAGNOSIS — Z9889 Other specified postprocedural states: Secondary | ICD-10-CM

## 2022-11-26 NOTE — Progress Notes (Signed)
The patient is now around 6 weeks status post a right open carpal tunnel release.  She is 82 years old.  She says she is improving each week.  She says she is not waking up with the pain or numbness and tingling that she used to.  She still some pain at the incision site.  She has not had any bad impact on her activities day living as a relates to her surgery.  Her right hand incision has healed appropriately.  She does have some pillar pain which is to be expected for the first 3 months.  She has good pinch and grip strength overall.  From my standpoint follow-up can be as needed for her right hand.  She knows things will continue to slowly improve with time.  All question concerns were addressed and answered.

## 2023-02-12 DIAGNOSIS — Z6832 Body mass index (BMI) 32.0-32.9, adult: Secondary | ICD-10-CM | POA: Diagnosis not present

## 2023-02-12 DIAGNOSIS — Z01419 Encounter for gynecological examination (general) (routine) without abnormal findings: Secondary | ICD-10-CM | POA: Diagnosis not present

## 2023-02-12 DIAGNOSIS — Z1231 Encounter for screening mammogram for malignant neoplasm of breast: Secondary | ICD-10-CM | POA: Diagnosis not present

## 2023-02-25 NOTE — Progress Notes (Signed)
Cardiology Office Note Date:  02/25/2023  Patient ID:  Melinda, Lucas 1941-06-03, MRN [id number] PCP:  Cleatis Polka., MD  Electrophysiologist: Dr. Johney Frame   Chief Complaint:  annual visit  History of Present Illness: Melinda Lucas is a 82 y.o. female with history of GERD, OSA, obesity, AFib.  She saw Dr. Johney Frame April 2022, she was doing well No changes were made.  I saw her 03/03/22 She is doing very well Does not think she has had any AFib since her ablation and was quite symptomatic so thinks she would easily identify it if she were. No CP, SOB Trains her dogs for agility and stays active with this No exertional intolerances No bleeding or signs of bleeding Her PMD does labs regularly No changes made  TODAY She is doing very well from a cardiac perspective Reports she does have some balance trouble, but remains active. She has a dog that she does training/exercises with 3x/week No CP, SOB, DOE She will infrequently have an awareness of her heart beat, no symptoms with it Her watch tells her she has a 2-4% AFib burden, she has no symptoms of her Afib No near syncope or syncope. Eliquis is expensive, otherwise tolerating well No bleeding or signs of bleeding    AFib/AAD hx Tikosyn started 2016 > stopped post PVI April 2017 PVI ablation 08/09/2015   Past Medical History:  Diagnosis Date   Arthritis    "a little in my fingers" (12/16/2016)   Atrial fibrillation (HCC)    GERD (gastroesophageal reflux disease)    Heart murmur    History of kidney stones    Melanoma in situ of lower leg (HCC)    Obesity (BMI 30-39.9)    OSA on CPAP    "started it summer 2017"    Osteopenia     Past Surgical History:  Procedure Laterality Date   CATARACT EXTRACTION W/ INTRAOCULAR LENS  IMPLANT, BILATERAL Bilateral ~ 2014   CHOLECYSTECTOMY N/A 12/17/2016   Procedure: LAPAROSCOPIC CHOLECYSTECTOMY;  Surgeon: Abigail Miyamoto, MD;  Location: [facility] OR;  Service: General;   Laterality: N/A;   COLONOSCOPY     DILATION AND CURETTAGE OF UTERUS  1970's   ELECTROPHYSIOLOGIC STUDY N/A 08/09/2015   Procedure: Atrial Fibrillation Ablation;  Surgeon: Hillis Range, MD;  Location: [facility] INVASIVE CV LAB;  Service: Cardiovascular;  Laterality: N/A;   FRACTURE SURGERY     MELANOMA EXCISION Left 2010   leg   TEE WITHOUT CARDIOVERSION N/A 08/09/2015   Procedure: TRANSESOPHAGEAL ECHOCARDIOGRAM (TEE);  Surgeon: Jake Bathe, MD;  Location: [facility] ENDOSCOPY;  Service: Cardiovascular;  Laterality: N/A;   TONSILLECTOMY  1946   TUBAL LIGATION  1970's   WRIST FRACTURE SURGERY Left 2011    Current Outpatient Medications  Medication Sig Dispense Refill   HYDROcodone-acetaminophen (NORCO/VICODIN) 5-325 MG tablet Take 1 tablet by mouth every 6 (six) hours as needed for moderate pain. 20 tablet 0   Ascorbic Acid (VITAMIN C) 1000 MG tablet Take 1,000 mg by mouth daily.     cholecalciferol (VITAMIN D3) 25 MCG (1000 UNIT) tablet Take 2,000 Units by mouth daily.     colestipol (COLESTID) 1 g tablet TAKE 2 TABLETS BY MOUTH 2 TIMES DAILY. 360 tablet 3   ELIQUIS 5 MG TABS tablet Take 1 tablet (5 mg total) by mouth 2 (two) times daily. 180 tablet 1   loperamide (IMODIUM A-D) 2 MG tablet Take 2 mg by mouth as needed for diarrhea or loose stools.  TURMERIC PO Take 1 capsule by mouth daily.      Vitamin D, Ergocalciferol, (DRISDOL) 50000 UNITS CAPS Take 50,000 Units by mouth every Monday.      Zinc 50 MG TABS Take 1 tablet by mouth daily.     No current facility-administered medications for this visit.    Allergies:   Patient has no known allergies.   Social History:  The patient  reports that she has quit smoking. Her smoking use included cigarettes. She has a 7.00 pack-year smoking history. She has never used smokeless tobacco. She reports current alcohol use of about 14.0 standard drinks of alcohol per week. She reports that she does not use drugs.   Family History:  The patient's family  history includes AAA (abdominal aortic aneurysm) in her father; Colon cancer (age of onset: 47) in her mother; Hypertension in her brother.  ROS:  Please see the history of present illness.    All other systems are reviewed and otherwise negative.   PHYSICAL EXAM:  VS:  There were no vitals taken for this visit. BMI: There is no height or weight on file to calculate BMI. Well nourished, well developed, in no acute distress HEENT: normocephalic, atraumatic Neck: no JVD, carotid bruits or masses Cardiac:  RRR; no significant murmurs, no rubs, or gallops Lungs: CTA b/l, no wheezing, rhonchi or rales Abd: soft, nontender MS: no deformity or atrophy Ext: no edema Skin: warm and dry, no rash Neuro:  No gross deficits appreciated Psych: euthymic mood, full affect   EKG:  Done today and reviewed by myself shows  SR 85bpm   01/12/2020: TEE 1. Left ventricular ejection fraction, by estimation, is 65 to 70%. The  left ventricle has normal function. The left ventricle has no regional  wall motion abnormalities. Left ventricular diastolic parameters are  consistent with Grade II diastolic  dysfunction (pseudonormalization). Elevated left ventricular end-diastolic  pressure. The average left ventricular global longitudinal strain is  normal at -21.9 %.   2. Right ventricular systolic function is normal. The right ventricular  size is normal. There is moderately elevated pulmonary artery systolic  pressure.   3. Left atrial size was mildly dilated.   4. The mitral valve is normal in structure. Trivial mitral valve  regurgitation. No evidence of mitral stenosis.   5. The aortic valve is tricuspid. There is mild to moderate aortic  annular calcification. There is moderate thickening of the aortic valve.  There is moderate calcification of the aortic valve. Aortic valve mean  gradient measures 10.0 mmHg. Aortic valve  peak gradient measures 16.8 mmHg. Aortic valve area, by VTI measures 2.25   cm. Aortic valve regurgitation is not visualized. No aortic stenosis is  present.   6. The inferior vena cava is normal in size with greater than 50%  respiratory variability, suggesting right atrial pressure of 3 mmHg.    08/09/2015: EPS/ablation CONCLUSIONS: 1. Sinus rhythm upon presentation.   2. Rotational Angiography reveals a moderate sized left atrium with four separate pulmonary veins without evidence of pulmonary vein stenosis. 3. Successful electrical isolation and anatomical encircling of all four pulmonary veins with radiofrequency current using a WACA approach 4. No inducible arrhythmias following ablation both on and off of Isuprel 5. No early apparent complications.   Recent Labs: No results found for requested labs within last 365 days.  No results found for requested labs within last 365 days.   CrCl cannot be calculated (Patient's most recent lab result is older than the  maximum 21 days allowed.).   Wt Readings from Last 3 Encounters:  03/03/22 187 lb 6.4 oz (85 kg)  04/30/21 189 lb 12.8 oz (86.1 kg)  02/28/21 190 lb 9.6 oz (86.5 kg)     Other studies reviewed: Additional studies/records reviewed today include: summarized above  ASSESSMENT AND PLAN:  Paroxysmal AFib CHA2DS2Vasc is 3, on Eliquis, labs are done via her PMD No symptoms of her Afib Watch reports 2-4% burden  2, secondary hypercoagulable state   We discussed need to transition to a new EP MD, she is agreeable to see Dr. Elberta Fortis   Disposition: she would like to return in 6 mo, we will see her then, sooner if needed    Current medicines are reviewed at length with the patient today.  The patient did not have any concerns regarding medicines.  Norma Fredrickson, PA-C 02/25/2023 1:08 PM     [facility] [address] [place] [address] [phone] (office)  [phone] (fax)

## 2023-02-27 ENCOUNTER — Ambulatory Visit: Payer: Medicare Other | Attending: Physician Assistant | Admitting: Physician Assistant

## 2023-02-27 ENCOUNTER — Other Ambulatory Visit: Payer: Self-pay

## 2023-02-27 ENCOUNTER — Encounter: Payer: Self-pay | Admitting: Physician Assistant

## 2023-02-27 VITALS — BP 140/76 | HR 85 | Ht 64.0 in | Wt 188.0 lb

## 2023-02-27 DIAGNOSIS — I480 Paroxysmal atrial fibrillation: Secondary | ICD-10-CM | POA: Diagnosis not present

## 2023-02-27 DIAGNOSIS — D6869 Other thrombophilia: Secondary | ICD-10-CM

## 2023-02-27 NOTE — Patient Instructions (Signed)
Medication Instructions:   Your physician recommends that you continue on your current medications as directed. Please refer to the Current Medication list given to you today.   *If you need a refill on your cardiac medications before your next appointment, please call your pharmacy*   Lab Work: NONE ORDERED  TODAY   If you have labs (blood work) drawn today and your tests are completely normal, you will receive your results only by: MyChart Message (if you have MyChart) OR A paper copy in the mail If you have any lab test that is abnormal or we need to change your treatment, we will call you to review the results.   Testing/Procedures: NONE ORDERED  TODAY    Follow-Up: At [facility], you and your health needs are our priority.  As part of our continuing mission to provide you with exceptional heart care, we have created designated Provider Care Teams.  These Care Teams include your primary Cardiologist (physician) and Advanced Practice Providers (APPs -  Physician Assistants and Nurse Practitioners) who all work together to provide you with the care you need, when you need it.  We recommend signing up for the patient portal called "MyChart".  Sign up information is provided on this After Visit Summary.  MyChart is used to connect with patients for Virtual Visits (Telemedicine).  Patients are able to view lab/test results, encounter notes, upcoming appointments, etc.  Non-urgent messages can be sent to your provider as well.   To learn more about what you can do with MyChart, go to [email or web address].    Your next appointment:   6 month(s)  Provider:   Will Camnitz, MD    Other Instructions  

## 2023-03-18 DIAGNOSIS — Z85820 Personal history of malignant melanoma of skin: Secondary | ICD-10-CM | POA: Diagnosis not present

## 2023-03-18 DIAGNOSIS — D2261 Melanocytic nevi of right upper limb, including shoulder: Secondary | ICD-10-CM | POA: Diagnosis not present

## 2023-03-18 DIAGNOSIS — D2239 Melanocytic nevi of other parts of face: Secondary | ICD-10-CM | POA: Diagnosis not present

## 2023-03-18 DIAGNOSIS — L570 Actinic keratosis: Secondary | ICD-10-CM | POA: Diagnosis not present

## 2023-03-18 DIAGNOSIS — Z85828 Personal history of other malignant neoplasm of skin: Secondary | ICD-10-CM | POA: Diagnosis not present

## 2023-03-18 DIAGNOSIS — L821 Other seborrheic keratosis: Secondary | ICD-10-CM | POA: Diagnosis not present

## 2023-03-18 DIAGNOSIS — L738 Other specified follicular disorders: Secondary | ICD-10-CM | POA: Diagnosis not present

## 2023-03-18 DIAGNOSIS — L720 Epidermal cyst: Secondary | ICD-10-CM | POA: Diagnosis not present

## 2023-05-05 ENCOUNTER — Other Ambulatory Visit: Payer: Self-pay | Admitting: Physician Assistant

## 2023-05-05 DIAGNOSIS — I480 Paroxysmal atrial fibrillation: Secondary | ICD-10-CM

## 2023-05-06 NOTE — Telephone Encounter (Signed)
Scr 0.8 on 06/05/22 via Care Everywhere.  Refill sent.

## 2023-05-06 NOTE — Telephone Encounter (Signed)
Eliquis 5mg  refill request received. Patient is 82 years old, weight-85.3kg, Crea- on via , Diagnosis-Afib, and last seen by Francis Dowse on 02/27/23. Dose is appropriate based on dosing criteria.   Needs labs. Will need to call PCP

## 2023-05-25 ENCOUNTER — Other Ambulatory Visit: Payer: Self-pay | Admitting: Internal Medicine

## 2023-06-10 DIAGNOSIS — G4733 Obstructive sleep apnea (adult) (pediatric): Secondary | ICD-10-CM | POA: Diagnosis not present

## 2023-06-10 DIAGNOSIS — I480 Paroxysmal atrial fibrillation: Secondary | ICD-10-CM | POA: Diagnosis not present

## 2023-06-23 DIAGNOSIS — E785 Hyperlipidemia, unspecified: Secondary | ICD-10-CM | POA: Diagnosis not present

## 2023-06-23 DIAGNOSIS — E559 Vitamin D deficiency, unspecified: Secondary | ICD-10-CM | POA: Diagnosis not present

## 2023-06-23 DIAGNOSIS — R7301 Impaired fasting glucose: Secondary | ICD-10-CM | POA: Diagnosis not present

## 2023-06-30 DIAGNOSIS — Z23 Encounter for immunization: Secondary | ICD-10-CM | POA: Diagnosis not present

## 2023-06-30 DIAGNOSIS — R82998 Other abnormal findings in urine: Secondary | ICD-10-CM | POA: Diagnosis not present

## 2023-06-30 DIAGNOSIS — Z8673 Personal history of transient ischemic attack (TIA), and cerebral infarction without residual deficits: Secondary | ICD-10-CM | POA: Diagnosis not present

## 2023-06-30 DIAGNOSIS — Z1339 Encounter for screening examination for other mental health and behavioral disorders: Secondary | ICD-10-CM | POA: Diagnosis not present

## 2023-06-30 DIAGNOSIS — I480 Paroxysmal atrial fibrillation: Secondary | ICD-10-CM | POA: Diagnosis not present

## 2023-06-30 DIAGNOSIS — E669 Obesity, unspecified: Secondary | ICD-10-CM | POA: Diagnosis not present

## 2023-06-30 DIAGNOSIS — Z1331 Encounter for screening for depression: Secondary | ICD-10-CM | POA: Diagnosis not present

## 2023-06-30 DIAGNOSIS — Z0000 Encounter for general adult medical examination without abnormal findings: Secondary | ICD-10-CM | POA: Diagnosis not present

## 2023-06-30 DIAGNOSIS — E785 Hyperlipidemia, unspecified: Secondary | ICD-10-CM | POA: Diagnosis not present

## 2023-06-30 DIAGNOSIS — D6869 Other thrombophilia: Secondary | ICD-10-CM | POA: Diagnosis not present

## 2023-06-30 DIAGNOSIS — Z86010 Personal history of colonic polyps: Secondary | ICD-10-CM | POA: Diagnosis not present

## 2023-06-30 DIAGNOSIS — E781 Pure hyperglyceridemia: Secondary | ICD-10-CM | POA: Diagnosis not present

## 2023-06-30 DIAGNOSIS — R7301 Impaired fasting glucose: Secondary | ICD-10-CM | POA: Diagnosis not present

## 2023-08-09 DIAGNOSIS — Z23 Encounter for immunization: Secondary | ICD-10-CM | POA: Diagnosis not present

## 2023-08-19 ENCOUNTER — Other Ambulatory Visit: Payer: Self-pay | Admitting: Internal Medicine

## 2023-08-21 ENCOUNTER — Telehealth: Payer: Self-pay | Admitting: Internal Medicine

## 2023-08-21 MED ORDER — COLESTIPOL HCL 1 G PO TABS: 2.0000 g | ORAL_TABLET | Freq: Two times a day (BID) | ORAL | 1 refills | Status: DC

## 2023-08-21 NOTE — Telephone Encounter (Signed)
Inbound call from patient requesting a refill for Colestipol medication. Patient has been scheduled for 1/31. Also requesting for a mychart message to be sent once refill has be sent. Please advise, thank you.

## 2023-08-21 NOTE — Telephone Encounter (Signed)
Colestipol refilled ?

## 2023-09-03 DIAGNOSIS — H43812 Vitreous degeneration, left eye: Secondary | ICD-10-CM | POA: Diagnosis not present

## 2023-09-03 DIAGNOSIS — H524 Presbyopia: Secondary | ICD-10-CM | POA: Diagnosis not present

## 2023-09-03 DIAGNOSIS — H3554 Dystrophies primarily involving the retinal pigment epithelium: Secondary | ICD-10-CM | POA: Diagnosis not present

## 2023-09-03 DIAGNOSIS — H04123 Dry eye syndrome of bilateral lacrimal glands: Secondary | ICD-10-CM | POA: Diagnosis not present

## 2023-09-03 DIAGNOSIS — H18593 Other hereditary corneal dystrophies, bilateral: Secondary | ICD-10-CM | POA: Diagnosis not present

## 2023-09-07 ENCOUNTER — Encounter: Payer: Self-pay | Admitting: Cardiology

## 2023-09-07 ENCOUNTER — Ambulatory Visit: Payer: Medicare Other | Attending: Cardiology | Admitting: Cardiology

## 2023-09-07 VITALS — BP 144/74 | HR 90 | Ht 64.0 in | Wt 191.2 lb

## 2023-09-07 DIAGNOSIS — I480 Paroxysmal atrial fibrillation: Secondary | ICD-10-CM | POA: Insufficient documentation

## 2023-09-07 DIAGNOSIS — D6869 Other thrombophilia: Secondary | ICD-10-CM | POA: Diagnosis not present

## 2023-09-07 NOTE — Progress Notes (Signed)
  Electrophysiology Office Note:   Date:  09/07/2023  ID:  Melinda Lucas, DOB [birth year], MRN [id number]  Primary Cardiologist: None Electrophysiologist: None      History of Present Illness:   Melinda Lucas is a 82 y.o. female with h/o of OSA, obesity, atrial fibrillation post PVI 2016 seen today for routine electrophysiology followup.   Since last being seen in our clinic the patient reports doing well.  She has had no further episodes of atrial fibrillation.  She continues to do her daily activities without restriction.  She has no acute complaints.  she denies chest pain, palpitations, dyspnea, PND, orthopnea, nausea, vomiting, dizziness, syncope, edema, weight gain, or early satiety.   Review of systems complete and found to be negative unless listed in HPI.   EP Information / Studies Reviewed:    EKG is ordered today. Personal review as below.  EKG Interpretation Date/Time:  Monday September 07 2023 11:01:34 EST Ventricular Rate:  90 PR Interval:  166 QRS Duration:  80 QT Interval:  360 QTC Calculation: 440 R Axis:   59  Text Interpretation: Sinus rhythm with Premature atrial complexes When compared with ECG of 27-Feb-2023 10:53, Premature atrial complexes are now Present Confirmed by Emelynn Rance ([id number]) on 09/07/2023 11:06:38 AM     Risk Assessment/Calculations:    CHA2DS2-VASc Score = 3   This indicates a 3.2% annual risk of stroke. The patient's score is based upon: CHF History: 0 HTN History: 0 Diabetes History: 0 Stroke History: 0 Vascular Disease History: 0 Age Score: 2 Gender Score: 1           Physical Exam:   VS:  BP (!) 144/74 (BP Location: Left Arm, Patient Position: Sitting, Cuff Size: Normal)   Pulse 90   Ht 5\' 4"  (1.626 m)   Wt 191 lb 3.2 oz (86.7 kg)   SpO2 95%   BMI 32.82 kg/m    Wt Readings from Last 3 Encounters:  09/07/23 191 lb 3.2 oz (86.7 kg)  02/27/23 188 lb (85.3 kg)  03/03/22 187 lb 6.4 oz (85 kg)     GEN: Well nourished,  well developed in no acute distress NECK: No JVD; No carotid bruits CARDIAC: Regular rate and rhythm, no murmurs, rubs, gallops RESPIRATORY:  Clear to auscultation without rales, wheezing or rhonchi  ABDOMEN: Soft, non-tender, non-distended EXTREMITIES:  No edema; No deformity   ASSESSMENT AND PLAN:    1.  Paroxysmal atrial fibrillation: Post ablation in 2016.  She has had no further episodes of atrial fibrillation.  Rhythm has been well-controlled.  Melinda Lucas continue with current management.  2.  Secondary hypercoagulable state: Currently on Eliquis for atrial fibrillation  Follow up with EP APP in 12 months  Signed, Jamorris Ndiaye Jorja Loa, MD

## 2023-09-14 DIAGNOSIS — L738 Other specified follicular disorders: Secondary | ICD-10-CM | POA: Diagnosis not present

## 2023-09-14 DIAGNOSIS — L570 Actinic keratosis: Secondary | ICD-10-CM | POA: Diagnosis not present

## 2023-09-14 DIAGNOSIS — D2261 Melanocytic nevi of right upper limb, including shoulder: Secondary | ICD-10-CM | POA: Diagnosis not present

## 2023-09-14 DIAGNOSIS — D225 Melanocytic nevi of trunk: Secondary | ICD-10-CM | POA: Diagnosis not present

## 2023-09-14 DIAGNOSIS — L820 Inflamed seborrheic keratosis: Secondary | ICD-10-CM | POA: Diagnosis not present

## 2023-09-14 DIAGNOSIS — D224 Melanocytic nevi of scalp and neck: Secondary | ICD-10-CM | POA: Diagnosis not present

## 2023-09-14 DIAGNOSIS — Z85820 Personal history of malignant melanoma of skin: Secondary | ICD-10-CM | POA: Diagnosis not present

## 2023-09-14 DIAGNOSIS — Z85828 Personal history of other malignant neoplasm of skin: Secondary | ICD-10-CM | POA: Diagnosis not present

## 2023-09-14 DIAGNOSIS — L821 Other seborrheic keratosis: Secondary | ICD-10-CM | POA: Diagnosis not present

## 2023-10-19 DIAGNOSIS — M76822 Posterior tibial tendinitis, left leg: Secondary | ICD-10-CM | POA: Diagnosis not present

## 2023-10-19 DIAGNOSIS — M25572 Pain in left ankle and joints of left foot: Secondary | ICD-10-CM | POA: Diagnosis not present

## 2023-10-19 DIAGNOSIS — R269 Unspecified abnormalities of gait and mobility: Secondary | ICD-10-CM | POA: Diagnosis not present

## 2023-10-19 DIAGNOSIS — M79672 Pain in left foot: Secondary | ICD-10-CM | POA: Diagnosis not present

## 2023-11-20 ENCOUNTER — Ambulatory Visit: Payer: Medicare Other | Admitting: Physician Assistant

## 2023-11-23 ENCOUNTER — Ambulatory Visit: Payer: Medicare Other | Admitting: Physician Assistant

## 2023-12-15 DIAGNOSIS — M25672 Stiffness of left ankle, not elsewhere classified: Secondary | ICD-10-CM | POA: Diagnosis not present

## 2023-12-15 DIAGNOSIS — R2689 Other abnormalities of gait and mobility: Secondary | ICD-10-CM | POA: Diagnosis not present

## 2023-12-24 ENCOUNTER — Encounter: Payer: Self-pay | Admitting: Nurse Practitioner

## 2023-12-24 ENCOUNTER — Ambulatory Visit: Payer: Medicare Other | Admitting: Nurse Practitioner

## 2023-12-24 VITALS — BP 136/78 | HR 89 | Ht 64.0 in | Wt 184.2 lb

## 2023-12-24 DIAGNOSIS — Z7901 Long term (current) use of anticoagulants: Secondary | ICD-10-CM | POA: Diagnosis not present

## 2023-12-24 DIAGNOSIS — Z860101 Personal history of adenomatous and serrated colon polyps: Secondary | ICD-10-CM | POA: Diagnosis not present

## 2023-12-24 DIAGNOSIS — K529 Noninfective gastroenteritis and colitis, unspecified: Secondary | ICD-10-CM

## 2023-12-24 DIAGNOSIS — I4891 Unspecified atrial fibrillation: Secondary | ICD-10-CM | POA: Diagnosis not present

## 2023-12-24 MED ORDER — COLESTIPOL HCL 1 G PO TABS: 2.0000 g | ORAL_TABLET | Freq: Two times a day (BID) | ORAL | 3 refills | Status: AC

## 2023-12-24 NOTE — Progress Notes (Signed)
 12/24/2023 SUMIYA MAMARIL [id number] Jun 11, 1941   Chief Complaint: Medication refill   History of Present Illness: Melinda Lucas is an 83 year old female with a past medical history of atrial fibrillation s/p ablation 2016 on Eliquis, OSA, colon polyps and chronic diarrhea which is well controlled on Colestipol. She is known by Dr. Marina Goodell.  She presents today to refill colestipol prescription.  She takes colestipol 1 g 2 tabs p.o. twice daily.  She was last seen in office by Dr. Marina Goodell 04/30/2021, at that time he recommended decreasing colestipol to 1 g p.o. twice daily, she does not recall if she ever tried that or not.  If she skips the morning or p.m. dose of colestipol she regresses and experiences numerous nonbloody diarrhea bowel movements.  She typically passes 1 formed brown bowel movement daily.  No bloody or black stools.  No abdominal pain.  She has a history of colon polyps.  Her most recent colonoscopy in 2017 identified 1 tubular adenomatous polyp removed from the colon.  No further colonoscopies recommended due to age.  She stated her mother died from colon cancer at age 22.  She and her spouse are moving, downsizing to a smaller home which she indicated will keep her quite busy.  Her most recent colonoscopy was 12/30/2025 which identified two 2 to 5 mm tubular adenomatous polyps removed from the ascending colon and in the cecum and diverticulosis in the left colon.  No further colonoscopies were recommended due to age.  No GERD symptoms or dysphagia.  No chest pain or shortness of breath.  She stated undergoing routine laboratory studies with her PCP 06/2023 which were normal.  Current Outpatient Medications on File Prior to Visit  Medication Sig Dispense Refill   Ascorbic Acid (VITAMIN C) 1000 MG tablet Take 1,000 mg by mouth daily.     cholecalciferol (VITAMIN D3) 25 MCG (1000 UNIT) tablet Take 2,000 Units by mouth daily.     ELIQUIS 5 MG TABS tablet TAKE 1 TABLET TWICE A DAY 180  tablet 1   loperamide (IMODIUM A-D) 2 MG tablet Take 2 mg by mouth as needed for diarrhea or loose stools.     Multiple Vitamins-Minerals (CENTRUM ULTRA WOMENS) TABS Take 1 tablet by mouth daily.     Vitamin D, Ergocalciferol, (DRISDOL) 50000 UNITS CAPS Take 50,000 Units by mouth every Monday.      Zinc 50 MG TABS Take 1 tablet by mouth daily.     No current facility-administered medications on file prior to visit.   No Known Allergies  Current Medications, Allergies, Past Medical History, Past Surgical History, Family History and Social History were reviewed in [employer] record.  Review of Systems:   Constitutional: Negative for fever, sweats, chills or weight loss.  Respiratory: Negative for shortness of breath.   Cardiovascular: Negative for chest pain, palpitations and leg swelling.  Gastrointestinal: See HPI.  Musculoskeletal: Negative for back pain or muscle aches.  Neurological: Negative for dizziness, headaches or paresthesias.   Physical Exam: BP 136/78   Pulse 89   Ht 5\' 4"  (1.626 m)   Wt 184 lb 4 oz (83.6 kg)   BMI 31.63 kg/m  General: 83 year old female in no acute distress. Head: Normocephalic and atraumatic. Eyes: No scleral icterus. Conjunctiva pink . Ears: Normal auditory acuity. Mouth: Dentition intact. No ulcers or lesions.  Lungs: Clear throughout to auscultation. Heart: Regular rate and rhythm, no murmur. Abdomen: Soft, nontender and nondistended. No masses or  hepatomegaly. Normal bowel sounds x 4 quadrants.  Rectal: Deferred. Musculoskeletal: Symmetrical with no gross deformities. Extremities: No edema. Neurological: Alert oriented x 4. No focal deficits.  Psychological: Alert and cooperative. Normal mood and affect  Assessment and Recommendations:  83 year old female with chronic diarrhea well-controlled on Colestipol 1 g 2 tabs p.o. twice daily -Colestipol 1 g 2 tabs p.o. twice daily -Patient questions if she can eventually  reduce the colestipol dose, she may slowly decrease by taking Colestipol 2 tabs in the a.m. and 1 tab in the p.m. if tolerated.  Patient is well aware not to take Colestipol within 4 hours of any other medication. -Follow-up in 1 year and as needed -Patient will send me copy of her most recent labs   History of colon polyps.  Two tubular adenomatous polyps removed from the colon per colonoscopy in 01/22/2016.  Mother died at the age of 24 secondary to colon cancer. -No further colon polyp surveillance colonoscopies recommended due to age  Atrial fibrillation on Eliquis. CHA2DS2-VASc Score = 3

## 2023-12-24 NOTE — Patient Instructions (Addendum)
 We have sent the following medications to your pharmacy for you to pick up at your convenience:  Colestipol  _______________________________________________________  If your blood pressure at your visit was 140/90 or greater, please contact your primary care physician to follow up on this.  _______________________________________________________  If you are age 85 or older, your body mass index should be between 23-30. Your Body mass index is 31.63 kg/m. If this is out of the aforementioned range listed, please consider follow up with your Primary Care Provider.  If you are age 1 or younger, your body mass index should be between 19-25. Your Body mass index is 31.63 kg/m. If this is out of the aformentioned range listed, please consider follow up with your Primary Care Provider.   ________________________________________________________  The [facility] providers would like to encourage you to use [facility] to communicate with providers for non-urgent requests or questions.  Due to long hold times on the telephone, sending your provider a message by [facility] may be a faster and more efficient way to get a response.  Please allow 48 business hours for a response.  Please remember that this is for non-urgent requests.  _______________________________________________________   Thank you for trusting me with your gastrointestinal care!   Alcide Evener, CRNP

## 2023-12-25 NOTE — Progress Notes (Signed)
 Noted.

## 2023-12-28 DIAGNOSIS — M25672 Stiffness of left ankle, not elsewhere classified: Secondary | ICD-10-CM | POA: Diagnosis not present

## 2023-12-28 DIAGNOSIS — R2689 Other abnormalities of gait and mobility: Secondary | ICD-10-CM | POA: Diagnosis not present

## 2023-12-31 ENCOUNTER — Telehealth: Payer: Self-pay | Admitting: *Deleted

## 2023-12-31 ENCOUNTER — Telehealth: Payer: Self-pay | Admitting: Physician Assistant

## 2023-12-31 DIAGNOSIS — I480 Paroxysmal atrial fibrillation: Secondary | ICD-10-CM

## 2023-12-31 MED ORDER — ELIQUIS 5 MG PO TABS: 5.0000 mg | ORAL_TABLET | Freq: Two times a day (BID) | ORAL | 1 refills | Status: DC

## 2023-12-31 NOTE — Telephone Encounter (Signed)
 PCP returned call. Scr 0.8 on 06/23/23.  Appropriate dose. Refill sent.

## 2023-12-31 NOTE — Telephone Encounter (Signed)
 Called PCP to request updated labs. Left message for Dr Alver Fisher nurse with call back number and anticoagulation clinic fax number.

## 2023-12-31 NOTE — Telephone Encounter (Signed)
*  STAT* If patient is at the pharmacy, call can be transferred to refill team.   1. Which medications need to be refilled? (please list name of each medication and dose if known) new prescription for Eliquis   2. Would you like to learn more about the convenience, safety, & potential cost savings by using the [facility]?      3. Are you open to using the [facility] (Type [facility].    4. Which pharmacy/location (including street and city if local pharmacy) is medication to be sent to?[facility] [place], [place]   5. Do they need a 30 day or 90 day supply? 90 days # 180 and refills

## 2023-12-31 NOTE — Telephone Encounter (Signed)
 Prescription refill request for Eliquis received. Indication: afib  Last office visit: Camnitz, 09/07/2023 Scr: Age: 83 yo  Weight: 83.6 kg   Pt overdue for blood work. To call PCP for lab results.  [facility] [place]

## 2023-12-31 NOTE — Telephone Encounter (Signed)
Refill sent in previous refill encounter

## 2023-12-31 NOTE — Addendum Note (Signed)
 Addended by: Betsy Coder B on: 12/31/2023 09:38 AM   Modules accepted: Orders

## 2024-01-04 DIAGNOSIS — M25672 Stiffness of left ankle, not elsewhere classified: Secondary | ICD-10-CM | POA: Diagnosis not present

## 2024-01-04 DIAGNOSIS — R2689 Other abnormalities of gait and mobility: Secondary | ICD-10-CM | POA: Diagnosis not present

## 2024-01-15 DIAGNOSIS — R2689 Other abnormalities of gait and mobility: Secondary | ICD-10-CM | POA: Diagnosis not present

## 2024-01-15 DIAGNOSIS — M25672 Stiffness of left ankle, not elsewhere classified: Secondary | ICD-10-CM | POA: Diagnosis not present

## 2024-01-25 ENCOUNTER — Other Ambulatory Visit: Payer: Self-pay | Admitting: Internal Medicine

## 2024-03-02 DIAGNOSIS — Z124 Encounter for screening for malignant neoplasm of cervix: Secondary | ICD-10-CM | POA: Diagnosis not present

## 2024-03-02 DIAGNOSIS — N958 Other specified menopausal and perimenopausal disorders: Secondary | ICD-10-CM | POA: Diagnosis not present

## 2024-03-02 DIAGNOSIS — Z6831 Body mass index (BMI) 31.0-31.9, adult: Secondary | ICD-10-CM | POA: Diagnosis not present

## 2024-03-02 DIAGNOSIS — M816 Localized osteoporosis [Lequesne]: Secondary | ICD-10-CM | POA: Diagnosis not present

## 2024-03-02 DIAGNOSIS — Z1151 Encounter for screening for human papillomavirus (HPV): Secondary | ICD-10-CM | POA: Diagnosis not present

## 2024-03-02 DIAGNOSIS — Z1231 Encounter for screening mammogram for malignant neoplasm of breast: Secondary | ICD-10-CM | POA: Diagnosis not present

## 2024-03-02 DIAGNOSIS — Z01419 Encounter for gynecological examination (general) (routine) without abnormal findings: Secondary | ICD-10-CM | POA: Diagnosis not present

## 2024-03-11 DIAGNOSIS — R82998 Other abnormal findings in urine: Secondary | ICD-10-CM | POA: Diagnosis not present

## 2024-03-11 DIAGNOSIS — N390 Urinary tract infection, site not specified: Secondary | ICD-10-CM | POA: Diagnosis not present

## 2024-03-16 DIAGNOSIS — M76822 Posterior tibial tendinitis, left leg: Secondary | ICD-10-CM | POA: Diagnosis not present

## 2024-03-16 DIAGNOSIS — R269 Unspecified abnormalities of gait and mobility: Secondary | ICD-10-CM | POA: Diagnosis not present

## 2024-03-16 DIAGNOSIS — M6702 Short Achilles tendon (acquired), left ankle: Secondary | ICD-10-CM | POA: Diagnosis not present

## 2024-04-20 DIAGNOSIS — Z85828 Personal history of other malignant neoplasm of skin: Secondary | ICD-10-CM | POA: Diagnosis not present

## 2024-04-20 DIAGNOSIS — L218 Other seborrheic dermatitis: Secondary | ICD-10-CM | POA: Diagnosis not present

## 2024-04-20 DIAGNOSIS — L821 Other seborrheic keratosis: Secondary | ICD-10-CM | POA: Diagnosis not present

## 2024-04-20 DIAGNOSIS — B353 Tinea pedis: Secondary | ICD-10-CM | POA: Diagnosis not present

## 2024-04-20 DIAGNOSIS — D2261 Melanocytic nevi of right upper limb, including shoulder: Secondary | ICD-10-CM | POA: Diagnosis not present

## 2024-04-20 DIAGNOSIS — L720 Epidermal cyst: Secondary | ICD-10-CM | POA: Diagnosis not present

## 2024-04-20 DIAGNOSIS — Z85820 Personal history of malignant melanoma of skin: Secondary | ICD-10-CM | POA: Diagnosis not present

## 2024-04-20 DIAGNOSIS — L814 Other melanin hyperpigmentation: Secondary | ICD-10-CM | POA: Diagnosis not present

## 2024-04-26 DIAGNOSIS — R310 Gross hematuria: Secondary | ICD-10-CM | POA: Diagnosis not present

## 2024-05-04 DIAGNOSIS — R310 Gross hematuria: Secondary | ICD-10-CM | POA: Diagnosis not present

## 2024-06-13 DIAGNOSIS — N3080 Other cystitis without hematuria: Secondary | ICD-10-CM | POA: Diagnosis not present

## 2024-06-13 DIAGNOSIS — N200 Calculus of kidney: Secondary | ICD-10-CM | POA: Diagnosis not present

## 2024-06-21 DIAGNOSIS — I480 Paroxysmal atrial fibrillation: Secondary | ICD-10-CM | POA: Diagnosis not present

## 2024-06-21 DIAGNOSIS — G4733 Obstructive sleep apnea (adult) (pediatric): Secondary | ICD-10-CM | POA: Diagnosis not present

## 2024-06-26 ENCOUNTER — Other Ambulatory Visit: Payer: Self-pay | Admitting: Cardiology

## 2024-06-26 DIAGNOSIS — I480 Paroxysmal atrial fibrillation: Secondary | ICD-10-CM

## 2024-06-27 NOTE — Telephone Encounter (Signed)
 Prescription refill request for Eliquis  received. Indication:afib Last office visit:11/24 Scr:0.8  2025 Age: 83 Weight:83.6  kg  Prescription refilled

## 2024-07-13 DIAGNOSIS — E785 Hyperlipidemia, unspecified: Secondary | ICD-10-CM | POA: Diagnosis not present

## 2024-07-13 DIAGNOSIS — R7301 Impaired fasting glucose: Secondary | ICD-10-CM | POA: Diagnosis not present

## 2024-07-13 DIAGNOSIS — E559 Vitamin D deficiency, unspecified: Secondary | ICD-10-CM | POA: Diagnosis not present

## 2024-07-20 ENCOUNTER — Other Ambulatory Visit: Payer: Self-pay | Admitting: Medical Genetics

## 2024-07-20 DIAGNOSIS — Z8673 Personal history of transient ischemic attack (TIA), and cerebral infarction without residual deficits: Secondary | ICD-10-CM | POA: Diagnosis not present

## 2024-07-20 DIAGNOSIS — G629 Polyneuropathy, unspecified: Secondary | ICD-10-CM | POA: Diagnosis not present

## 2024-07-20 DIAGNOSIS — Z1339 Encounter for screening examination for other mental health and behavioral disorders: Secondary | ICD-10-CM | POA: Diagnosis not present

## 2024-07-20 DIAGNOSIS — G4733 Obstructive sleep apnea (adult) (pediatric): Secondary | ICD-10-CM | POA: Diagnosis not present

## 2024-07-20 DIAGNOSIS — R7301 Impaired fasting glucose: Secondary | ICD-10-CM | POA: Diagnosis not present

## 2024-07-20 DIAGNOSIS — E785 Hyperlipidemia, unspecified: Secondary | ICD-10-CM | POA: Diagnosis not present

## 2024-07-20 DIAGNOSIS — Z1331 Encounter for screening for depression: Secondary | ICD-10-CM | POA: Diagnosis not present

## 2024-07-20 DIAGNOSIS — D6869 Other thrombophilia: Secondary | ICD-10-CM | POA: Diagnosis not present

## 2024-07-20 DIAGNOSIS — I480 Paroxysmal atrial fibrillation: Secondary | ICD-10-CM | POA: Diagnosis not present

## 2024-07-20 DIAGNOSIS — M25572 Pain in left ankle and joints of left foot: Secondary | ICD-10-CM | POA: Diagnosis not present

## 2024-07-20 DIAGNOSIS — Z0000 Encounter for general adult medical examination without abnormal findings: Secondary | ICD-10-CM | POA: Diagnosis not present

## 2024-07-20 DIAGNOSIS — R82998 Other abnormal findings in urine: Secondary | ICD-10-CM | POA: Diagnosis not present

## 2024-07-20 DIAGNOSIS — H8110 Benign paroxysmal vertigo, unspecified ear: Secondary | ICD-10-CM | POA: Diagnosis not present

## 2024-07-20 DIAGNOSIS — E781 Pure hyperglyceridemia: Secondary | ICD-10-CM | POA: Diagnosis not present

## 2024-07-20 DIAGNOSIS — R2681 Unsteadiness on feet: Secondary | ICD-10-CM | POA: Diagnosis not present

## 2024-07-20 DIAGNOSIS — M810 Age-related osteoporosis without current pathological fracture: Secondary | ICD-10-CM | POA: Diagnosis not present

## 2024-07-20 DIAGNOSIS — E669 Obesity, unspecified: Secondary | ICD-10-CM | POA: Diagnosis not present

## 2024-07-20 DIAGNOSIS — Z23 Encounter for immunization: Secondary | ICD-10-CM | POA: Diagnosis not present

## 2024-07-20 DIAGNOSIS — Z860101 Personal history of adenomatous and serrated colon polyps: Secondary | ICD-10-CM | POA: Diagnosis not present

## 2024-08-10 ENCOUNTER — Other Ambulatory Visit: Payer: Self-pay

## 2024-08-10 ENCOUNTER — Ambulatory Visit: Attending: Internal Medicine

## 2024-08-10 DIAGNOSIS — M6281 Muscle weakness (generalized): Secondary | ICD-10-CM | POA: Diagnosis not present

## 2024-08-10 DIAGNOSIS — R2681 Unsteadiness on feet: Secondary | ICD-10-CM | POA: Diagnosis not present

## 2024-08-10 NOTE — Therapy (Signed)
 OUTPATIENT PHYSICAL THERAPY LOWER EXTREMITY EVALUATION   Patient Name: Melinda Lucas MRN: [id number] DOB:[birth year], 83 y.o., female Today's Date: 08/11/2024  END OF SESSION:  PT End of Session - 08/10/24 1446     Visit Number 1    Number of Visits 16    Date for Recertification  10/10/24    Authorization Type MEDICARE/[employer] Supplemental    PT Start Time 1445    PT Stop Time 1530    PT Time Calculation (min) 45 min    Activity Tolerance Patient tolerated treatment well          Past Medical History:  Diagnosis Date   Arthritis    a little in my fingers (12/16/2016)   Atrial fibrillation (HCC)    GERD (gastroesophageal reflux disease)    Heart murmur    History of kidney stones    Melanoma in situ of lower leg (HCC)    Obesity (BMI 30-39.9)    OSA on CPAP    started it summer 2017    Osteopenia    Past Surgical History:  Procedure Laterality Date   CATARACT EXTRACTION W/ INTRAOCULAR LENS  IMPLANT, BILATERAL Bilateral ~ 2014   CHOLECYSTECTOMY N/A 12/17/2016   Procedure: LAPAROSCOPIC CHOLECYSTECTOMY;  Surgeon: Vicenta Poli, MD;  Location: [facility] OR;  Service: General;  Laterality: N/A;   COLONOSCOPY     DILATION AND CURETTAGE OF UTERUS  1970's   ELECTROPHYSIOLOGIC STUDY N/A 08/09/2015   Procedure: Atrial Fibrillation Ablation;  Surgeon: Lynwood Rakers, MD;  Location: [facility];  Service: Cardiovascular;  Laterality: N/A;   FRACTURE SURGERY     MELANOMA EXCISION Left 2010   leg   TEE WITHOUT CARDIOVERSION N/A 08/09/2015   Procedure: TRANSESOPHAGEAL ECHOCARDIOGRAM (TEE);  Surgeon: Oneil JAYSON Parchment, MD;  Location: [facility];  Service: Cardiovascular;  Laterality: N/A;   TONSILLECTOMY  1946   TUBAL LIGATION  1970's   WRIST FRACTURE SURGERY Left 2011   Patient Active Problem List   Diagnosis Date Noted   Impingement syndrome of right shoulder 02/25/2017   RUQ pain    Acute cholecystitis 12/16/2016   OSA (obstructive sleep apnea) 12/16/2016    Hypertension 12/16/2016   Sick sinus syndrome (HCC) 08/06/2015   Obesity (BMI 30-39.9) 07/14/2015   Paroxysmal atrial fibrillation (HCC)    Long term current use of anticoagulant therapy    Aortic arch atherosclerosis    Hyperlipidemia     PCP: Loreli Elsie JONETTA Mickey., MD Ref Provider (PCP)   REFERRING PROVIDER: Loreli Elsie JONETTA Mickey., MD Ref Provider (PCP)   REFERRING DIAG: R26.81 (ICD-10-CM) - Unsteady gait   THERAPY DIAG:  Muscle weakness (generalized)  Unsteady gait  Rationale for Evaluation and Treatment: Rehabilitation  ONSET DATE: 8 Months ago  SUBJECTIVE:   SUBJECTIVE STATEMENT: Started 8 months ago with insidious start. Feel unsteady when stepping on curb or when having to look up on shelf.   PERTINENT HISTORY: Did agility with dog 2 years ago PAIN:  Are you having pain? Yes: NPRS scale: 10 pain not related to referral condition  Pain location:   Pain description: aches and pains but unrelated to PCP diagnosis Aggravating factors:   Relieving factors:    PRECAUTIONS: None  RED FLAGS: None   WEIGHT BEARING RESTRICTIONS: No  FALLS:  Has patient fallen in last 6 months? No  OCCUPATION:   PLOF: Independent  PATIENT GOALS: improve perceived steadiness, stair/curb navigation  OBJECTIVE:  Note: Objective measures were completed at Evaluation unless otherwise noted.  PATIENT SURVEYS:  PSFS: THE PATIENT SPECIFIC FUNCTIONAL SCALE  Place score of 0-10 (0 = unable to perform activity and 10 = able to perform activity at the same level as before injury or problem)  Activity Date: 10/22    Getting up after sitting a while 5    2.Stepping up or down (need rail) 4    3. Looking up in grocery store (off balance) 4    4.      Total Score 13      Total Score = Sum of activity scores/number of activities  Minimally Detectable Change: 3 points (for single activity); 2 points (for average score)  [facility] (nd). The Patient Specific Functional  Scale . Retrieved from [email or web address]   COGNITION: Overall cognitive status: Within functional limits for tasks assessed     SENSATION: WFL   POSTURE: rounded shoulders, forward head, and increased lumbar lordosis  PALPATION: N/a  LOWER EXTREMITY ROM:  Active ROM Right eval Left eval  Hip flexion WFL BL hips   Hip extension    Hip abduction    Hip adduction    Hip internal rotation    Hip external rotation    Knee flexion    Knee extension    Ankle dorsiflexion    Ankle plantarflexion    Ankle inversion    Ankle eversion     (Blank rows = not tested)  LOWER EXTREMITY MMT:  MMT Right eval Left eval  Hip flexion 4- 4-  Hip extension    Hip abduction 4- 4-  Hip adduction 4- 4-  Hip internal rotation    Hip external rotation    Knee flexion 4- 4-  Knee extension 4- 4-  Ankle dorsiflexion    Ankle plantarflexion    Ankle inversion    Ankle eversion     (Blank rows = not tested)    FUNCTIONAL TESTS:  Sharpened ROMBERG:  Pt reported feeling unsteady with feet together (even more with eyes closed) LOB within 10 seconds with semi tandem with eyes open  Unable to balance full tandem BL w/ eyes open                                                                                                                                  TREATMENT DATE:   TREATMENT 08/10/2024:  Therapeutic Exercise: Seated green TB clamshell x6x3s STS x4  PPT x6x3s    Self-care/Home Management: Patient educated on HEP, POC, prognosis, and relevant tissues/anatomy.     PATIENT EDUCATION:  Education details: HEP Person educated: Patient Education method: Programmer, multimedia, Facilities manager, Actor cues, Verbal cues, and Handouts Education comprehension: verbalized understanding and returned demonstration  HOME EXERCISE PROGRAM: Access Code: [email or web address] URL: [email or web address] Date: 08/10/2024 Prepared by:  Washington Scot  Exercises - Sit to Stand with Arms Crossed  - 2 x daily - 5 x weekly - 2 sets - 3 reps - Seated Posterior Pelvic Tilt  -  2 x daily - 5 x weekly - 2 sets - 6 reps - 3 hold - Seated Hip Abduction  - 2 x daily - 5 x weekly - 2 sets - 6 reps - 3 hold  ASSESSMENT:  CLINICAL IMPRESSION: EVAL: Patient is a 83 year old female who presents with unsteady gait. Patient presents with deficits in: balance, BL LE, core, and hip strength. As a result, the patient would benefit from skilled PT to address aforementioned deficits via plan below.   OBJECTIVE IMPAIRMENTS: decreased balance, decreased strength, postural dysfunction, and obesity.   ACTIVITY LIMITATIONS: bending, squatting, stairs, and reach over head  PERSONAL FACTORS: Age and 1 comorbidity:   are also affecting patient's functional outcome.   REHAB POTENTIAL: Fair    CLINICAL DECISION MAKING: Stable/uncomplicated  EVALUATION COMPLEXITY: Moderate   GOALS: Goals reviewed with patient? No  SHORT TERM GOALS: Target date: 08/31/2024   Patient will demonstrate 75% HEP compliance to show independence with self-management of condition  Baseline: 0% Goal status: INITIAL  2.  Patient will be able to report overall perceived balance at least 50%% capacity to demonstrate improvements in lower body stability, ADL completion, and overall QOL  Baseline: 25% Goal status: INITIAL    LONG TERM GOALS: Target date: 10/10/24  Patient will demonstrate 100% HEP compliance to show independence with self-management of condition  Baseline: 0% Goal status: INITIAL  2.  Patient will demonstrate a 9 point improvement in PSFS to show improvements in ADL completion and overall QOL   Baseline: 13 Goal status: INITIAL  3.  Patient will be able to report overall perceived balance at least 90% capacity to demonstrate improvements in lower body stability, ADL completion, and overall QOL  Baseline: 25% Goal status:  INITIAL    PLAN:  PT FREQUENCY: 1-2x/week  PT DURATION: 8 weeks  PLANNED INTERVENTIONS: 97110-Therapeutic exercises, 97530- Therapeutic activity, 97112- Neuromuscular re-education, 97535- Self Care, [id number]- Manual therapy, [phone]- Gait training, Patient/Family education, Balance training, and Stair training  PLAN FOR NEXT SESSION: HEP assessment and progression, symptom modulation, and loading (isolated and/or functional). Manual therapy, aerobic, gait, and NME training as needed.     Washington Greener Maylyn Narvaiz  PT, DPT  08/11/2024, 8:18 AM

## 2024-08-24 ENCOUNTER — Ambulatory Visit: Attending: Internal Medicine

## 2024-08-24 DIAGNOSIS — M6281 Muscle weakness (generalized): Secondary | ICD-10-CM | POA: Diagnosis not present

## 2024-08-24 DIAGNOSIS — R2681 Unsteadiness on feet: Secondary | ICD-10-CM | POA: Insufficient documentation

## 2024-08-24 NOTE — Therapy (Signed)
 OUTPATIENT PHYSICAL THERAPY LOWER EXTREMITY EVALUATION   Patient Name: Melinda Lucas MRN: [id number] DOB:May 23, 1941, 83 y.o., female Today's Date: 08/24/2024  END OF SESSION:  PT End of Session - 08/24/24 1149     Visit Number 2    Number of Visits 16    Date for Recertification  10/10/24    Authorization Type MEDICARE/[employer] Supplemental    PT Start Time 1145    PT Stop Time 1230    PT Time Calculation (min) 45 min    Activity Tolerance Patient tolerated treatment well          Past Medical History:  Diagnosis Date   Arthritis    a little in my fingers (12/16/2016)   Atrial fibrillation (HCC)    GERD (gastroesophageal reflux disease)    Heart murmur    History of kidney stones    Melanoma in situ of lower leg (HCC)    Obesity (BMI 30-39.9)    OSA on CPAP    started it summer 2017    Osteopenia    Past Surgical History:  Procedure Laterality Date   CATARACT EXTRACTION W/ INTRAOCULAR LENS  IMPLANT, BILATERAL Bilateral ~ 2014   CHOLECYSTECTOMY N/A 12/17/2016   Procedure: LAPAROSCOPIC CHOLECYSTECTOMY;  Surgeon: Vicenta Poli, MD;  Location: [facility] OR;  Service: General;  Laterality: N/A;   COLONOSCOPY     DILATION AND CURETTAGE OF UTERUS  1970's   ELECTROPHYSIOLOGIC STUDY N/A 08/09/2015   Procedure: Atrial Fibrillation Ablation;  Surgeon: Lynwood Rakers, MD;  Location: [facility];  Service: Cardiovascular;  Laterality: N/A;   FRACTURE SURGERY     MELANOMA EXCISION Left 2010   leg   TEE WITHOUT CARDIOVERSION N/A 08/09/2015   Procedure: TRANSESOPHAGEAL ECHOCARDIOGRAM (TEE);  Surgeon: Oneil JAYSON Parchment, MD;  Location: [facility] ENDOSCOPY;  Service: Cardiovascular;  Laterality: N/A;   TONSILLECTOMY  1946   TUBAL LIGATION  1970's   WRIST FRACTURE SURGERY Left 2011   Patient Active Problem List   Diagnosis Date Noted   Impingement syndrome of right shoulder 02/25/2017   RUQ pain    Acute cholecystitis 12/16/2016   OSA (obstructive sleep apnea) 12/16/2016   Hypertension  12/16/2016   Sick sinus syndrome (HCC) 08/06/2015   Obesity (BMI 30-39.9) 07/14/2015   Paroxysmal atrial fibrillation (HCC)    Long term current use of anticoagulant therapy    Aortic arch atherosclerosis    Hyperlipidemia     PCP: Loreli Elsie JONETTA Mickey., MD Ref Provider (PCP)   REFERRING PROVIDER: Loreli Elsie JONETTA Mickey., MD Ref Provider (PCP)   REFERRING DIAG: R26.81 (ICD-10-CM) - Unsteady gait   THERAPY DIAG:  Muscle weakness (generalized)  Unsteady gait  Rationale for Evaluation and Treatment: Rehabilitation  ONSET DATE: 8 Months ago  SUBJECTIVE:   SUBJECTIVE STATEMENT:  Feels alright today with no pain. Reports doing HEP and feeling a little sore.    Started 8 months ago with insidious start. Feel unsteady when stepping on curb or when having to look up on shelf.   PERTINENT HISTORY: Did agility with dog 2 years ago PAIN:  Are you having pain? Yes: NPRS scale: 10 pain not related to referral condition  Pain location:   Pain description: aches and pains but unrelated to PCP diagnosis Aggravating factors:   Relieving factors:    PRECAUTIONS: None  RED FLAGS: None   WEIGHT BEARING RESTRICTIONS: No  FALLS:  Has patient fallen in last 6 months? No  OCCUPATION:   PLOF: Independent  PATIENT GOALS: improve perceived  steadiness, stair/curb navigation  OBJECTIVE:  Note: Objective measures were completed at Evaluation unless otherwise noted.   PATIENT SURVEYS:  PSFS: THE PATIENT SPECIFIC FUNCTIONAL SCALE  Place score of 0-10 (0 = unable to perform activity and 10 = able to perform activity at the same level as before injury or problem)  Activity Date: 10/22    Getting up after sitting a while 5    2.Stepping up or down (need rail) 4    3. Looking up in grocery store (off balance) 4    4.      Total Score 13      Total Score = Sum of activity scores/number of activities  Minimally Detectable Change: 3 points (for single activity); 2 points (for average  score)  [facility] (nd). The Patient Specific Functional Scale . Retrieved from [email or web address]   COGNITION: Overall cognitive status: Within functional limits for tasks assessed     SENSATION: WFL   POSTURE: rounded shoulders, forward head, and increased lumbar lordosis  PALPATION: N/a  LOWER EXTREMITY ROM:  Active ROM Right eval Left eval  Hip flexion WFL BL hips   Hip extension    Hip abduction    Hip adduction    Hip internal rotation    Hip external rotation    Knee flexion    Knee extension    Ankle dorsiflexion    Ankle plantarflexion    Ankle inversion    Ankle eversion     (Blank rows = not tested)  LOWER EXTREMITY MMT:  MMT Right eval Left eval  Hip flexion 4- 4-  Hip extension    Hip abduction 4- 4-  Hip adduction 4- 4-  Hip internal rotation    Hip external rotation    Knee flexion 4- 4-  Knee extension 4- 4-  Ankle dorsiflexion    Ankle plantarflexion    Ankle inversion    Ankle eversion     (Blank rows = not tested)    FUNCTIONAL TESTS:  Sharpened ROMBERG:  Pt reported feeling unsteady with feet together (even more with eyes closed) LOB within 10 seconds with semi tandem with eyes open  Unable to balance full tandem BL w/ eyes open                                                                                                                                  TREATMENT DATE:  Treatment 08/24/24: HEP reassessment and update Seated PPT x8x3s STS x8 Blue TB clamshells 2x10x3s Seated hip march 2x8x3s   Neuromuscular Reeducation: Semi tandem (Alternating feet each set) 4x10x10s BL Semi tandem (alternating feet each set) w/ head turns BL 2x4  Alternating SLS 2x8x3s BL Walking 250' with marching, head turns, and number recognition  Did not do: Arm out alternating ball hand off Step ups 6 inch step 2x8x3s     TREATMENT 08/10/2024:  Therapeutic  Exercise: Seated green TB clamshell x6x3s STS  x4  PPT x6x3s    Self-care/Home Management: Patient educated on HEP, POC, prognosis, and relevant tissues/anatomy.     PATIENT EDUCATION:  Education details: HEP Person educated: Patient Education method: Programmer, Multimedia, Facilities Manager, Actor cues, Verbal cues, and Handouts Education comprehension: verbalized understanding and returned demonstration  HOME EXERCISE PROGRAM: Access Code: [email or web address] URL: [email or web address] Date: 08/10/2024 Prepared by: Washington Scot  Exercises - Sit to Stand with Arms Crossed  - 2 x daily - 5 x weekly - 2 sets - 6-8 reps - Seated Posterior Pelvic Tilt  - 2 x daily - 5 x weekly - 2 sets - 8 reps - 3 hold - Seated Hip Abduction Blu TB  - 2 x daily - 5 x weekly - 2 sets - 6 reps - 3 hold  ASSESSMENT:  CLINICAL IMPRESSION: EVAL: Patient is a 83 year old female who presents with unsteady gait. Patient presents with deficits in: balance, BL LE, core, and hip strength. As a result, the patient would benefit from skilled PT to address aforementioned deficits via plan below.   OBJECTIVE IMPAIRMENTS: decreased balance, decreased strength, postural dysfunction, and obesity.   ACTIVITY LIMITATIONS: bending, squatting, stairs, and reach over head  PERSONAL FACTORS: Age and 1 comorbidity:   are also affecting patient's functional outcome.   REHAB POTENTIAL: Fair    CLINICAL DECISION MAKING: Stable/uncomplicated  EVALUATION COMPLEXITY: Moderate   GOALS: Goals reviewed with patient? No  SHORT TERM GOALS: Target date: 08/31/2024   Patient will demonstrate 75% HEP compliance to show independence with self-management of condition  Baseline: 0% Goal status: INITIAL  2.  Patient will be able to report overall perceived balance at least 50%% capacity to demonstrate improvements in lower body stability, ADL completion, and overall QOL  Baseline: 25% Goal status: INITIAL    LONG TERM  GOALS: Target date: 10/10/24  Patient will demonstrate 100% HEP compliance to show independence with self-management of condition  Baseline: 0% Goal status: INITIAL  2.  Patient will demonstrate a 9 point improvement in PSFS to show improvements in ADL completion and overall QOL   Baseline: 13 Goal status: INITIAL  3.  Patient will be able to report overall perceived balance at least 90% capacity to demonstrate improvements in lower body stability, ADL completion, and overall QOL  Baseline: 25% Goal status: INITIAL    PLAN:  PT FREQUENCY: 1-2x/week  PT DURATION: 8 weeks  PLANNED INTERVENTIONS: 97110-Therapeutic exercises, 97530- Therapeutic activity, 97112- Neuromuscular re-education, 97535- Self Care, 02859- Manual therapy, [phone]- Gait training, Patient/Family education, Balance training, and Stair training  PLAN FOR NEXT SESSION: HEP assessment and progression, symptom modulation, and loading (isolated and/or functional). Manual therapy, aerobic, gait, and NME training as needed.     Washington Greener Sullivan Blasing  PT, DPT  08/24/2024, 1:47 PM

## 2024-08-25 ENCOUNTER — Other Ambulatory Visit (INDEPENDENT_AMBULATORY_CARE_PROVIDER_SITE_OTHER)

## 2024-08-25 DIAGNOSIS — Z006 Encounter for examination for normal comparison and control in clinical research program: Secondary | ICD-10-CM | POA: Diagnosis not present

## 2024-08-25 DIAGNOSIS — Z23 Encounter for immunization: Secondary | ICD-10-CM | POA: Diagnosis not present

## 2024-08-26 ENCOUNTER — Ambulatory Visit

## 2024-08-29 ENCOUNTER — Ambulatory Visit

## 2024-08-29 DIAGNOSIS — M6281 Muscle weakness (generalized): Secondary | ICD-10-CM

## 2024-08-29 DIAGNOSIS — R2681 Unsteadiness on feet: Secondary | ICD-10-CM | POA: Diagnosis not present

## 2024-08-29 NOTE — Therapy (Signed)
 OUTPATIENT PHYSICAL THERAPY LOWER EXTREMITY EVALUATION   Patient Name: Melinda Lucas MRN: [id number] DOB:[birth year], 83 y.o., female Today's Date: 08/29/2024  END OF SESSION:  PT End of Session - 08/29/24 1200     Visit Number 3    Number of Visits 16    Date for Recertification  10/10/24    Authorization Type MEDICARE/[employer] Supplemental    PT Start Time 1200   Pt arrived late   PT Stop Time 1230    PT Time Calculation (min) 30 min    Activity Tolerance Patient tolerated treatment well          Past Medical History:  Diagnosis Date   Arthritis    a little in my fingers (12/16/2016)   Atrial fibrillation (HCC)    GERD (gastroesophageal reflux disease)    Heart murmur    History of kidney stones    Melanoma in situ of lower leg (HCC)    Obesity (BMI 30-39.9)    OSA on CPAP    started it summer 2017    Osteopenia    Past Surgical History:  Procedure Laterality Date   CATARACT EXTRACTION W/ INTRAOCULAR LENS  IMPLANT, BILATERAL Bilateral ~ 2014   CHOLECYSTECTOMY N/A 12/17/2016   Procedure: LAPAROSCOPIC CHOLECYSTECTOMY;  Surgeon: Vicenta Poli, MD;  Location: [facility] OR;  Service: General;  Laterality: N/A;   COLONOSCOPY     DILATION AND CURETTAGE OF UTERUS  1970's   ELECTROPHYSIOLOGIC STUDY N/A 08/09/2015   Procedure: Atrial Fibrillation Ablation;  Surgeon: Lynwood Rakers, MD;  Location: [facility];  Service: Cardiovascular;  Laterality: N/A;   FRACTURE SURGERY     MELANOMA EXCISION Left 2010   leg   TEE WITHOUT CARDIOVERSION N/A 08/09/2015   Procedure: TRANSESOPHAGEAL ECHOCARDIOGRAM (TEE);  Surgeon: Oneil JAYSON Parchment, MD;  Location: [facility] ENDOSCOPY;  Service: Cardiovascular;  Laterality: N/A;   TONSILLECTOMY  1946   TUBAL LIGATION  1970's   WRIST FRACTURE SURGERY Left 2011   Patient Active Problem List   Diagnosis Date Noted   Impingement syndrome of right shoulder 02/25/2017   RUQ pain    Acute cholecystitis 12/16/2016   OSA (obstructive sleep apnea)  12/16/2016   Hypertension 12/16/2016   Sick sinus syndrome (HCC) 08/06/2015   Obesity (BMI 30-39.9) 07/14/2015   Paroxysmal atrial fibrillation (HCC)    Long term current use of anticoagulant therapy    Aortic arch atherosclerosis    Hyperlipidemia     PCP: Loreli Elsie JONETTA Mickey., MD Ref Provider (PCP)   REFERRING PROVIDER: Loreli Elsie JONETTA Mickey., MD Ref Provider (PCP)   REFERRING DIAG: R26.81 (ICD-10-CM) - Unsteady gait   THERAPY DIAG:  Muscle weakness (generalized)  Unsteady gait  Rationale for Evaluation and Treatment: Rehabilitation  ONSET DATE: 8 Months ago  SUBJECTIVE:   SUBJECTIVE STATEMENT:  Feeling good today. Reports doing HEP as prescribed and feeling improvements in strength and balance.     Started 8 months ago with insidious start. Feel unsteady when stepping on curb or when having to look up on shelf.   PERTINENT HISTORY: Did agility with dog 2 years ago PAIN:  Are you having pain? Yes: NPRS scale: 10 pain not related to referral condition  Pain location:   Pain description: aches and pains but unrelated to PCP diagnosis Aggravating factors:   Relieving factors:    PRECAUTIONS: None  RED FLAGS: None   WEIGHT BEARING RESTRICTIONS: No  FALLS:  Has patient fallen in last 6 months? No  OCCUPATION:   PLOF:  Independent  PATIENT GOALS: improve perceived steadiness, stair/curb navigation  OBJECTIVE:  Note: Objective measures were completed at Evaluation unless otherwise noted.   PATIENT SURVEYS:  PSFS: THE PATIENT SPECIFIC FUNCTIONAL SCALE  Place score of 0-10 (0 = unable to perform activity and 10 = able to perform activity at the same level as before injury or problem)  Activity Date: 10/22    Getting up after sitting a while 5    2.Stepping up or down (need rail) 4    3. Looking up in grocery store (off balance) 4    4.      Total Score 13      Total Score = Sum of activity scores/number of activities  Minimally Detectable Change: 3  points (for single activity); 2 points (for average score)  [facility] (nd). The Patient Specific Functional Scale . Retrieved from [email or web address]   COGNITION: Overall cognitive status: Within functional limits for tasks assessed     SENSATION: WFL   POSTURE: rounded shoulders, forward head, and increased lumbar lordosis  PALPATION: N/a  LOWER EXTREMITY ROM:  Active ROM Right eval Left eval  Hip flexion WFL BL hips   Hip extension    Hip abduction    Hip adduction    Hip internal rotation    Hip external rotation    Knee flexion    Knee extension    Ankle dorsiflexion    Ankle plantarflexion    Ankle inversion    Ankle eversion     (Blank rows = not tested)  LOWER EXTREMITY MMT:  MMT Right eval Left eval  Hip flexion 4- 4-  Hip extension    Hip abduction 4- 4-  Hip adduction 4- 4-  Hip internal rotation    Hip external rotation    Knee flexion 4- 4-  Knee extension 4- 4-  Ankle dorsiflexion    Ankle plantarflexion    Ankle inversion    Ankle eversion     (Blank rows = not tested)    FUNCTIONAL TESTS:  Sharpened ROMBERG:  Pt reported feeling unsteady with feet together (even more with eyes closed) LOB within 10 seconds with semi tandem with eyes open  Unable to balance full tandem BL w/ eyes open                                                                                                                                  TREATMENT DATE:  Treatment 08/29/24: HEP reassessment and update Seated march x8x3s, x8x3s w/ PPT cuing STS x7 Blue TB clamshells 2x12x3s  Neuromuscular Reeducation Semi tandem stance 3x10s BL Semi tandem stance BL x6 w/ arms out alternating ball hand off Standing march 2x8x3s (with 2 finger BL UE support) Standing calf raise x8x3s (progressing to no UE support)       Treatment 08/24/24: HEP reassessment and update Seated PPT x8x3s STS  x8 Blue TB clamshells 2x10x3s Seated hip march  2x8x3s   Neuromuscular Reeducation: Semi tandem (Alternating feet each set) 4x10x10s BL Semi tandem (alternating feet each set) w/ head turns BL 2x4  Alternating SLS 2x8x3s BL Walking 250' with marching, head turns, and number recognition  Did not do: Arm out alternating ball hand off Step ups 6 inch step 2x8x3s     PATIENT EDUCATION:  Education details: HEP Person educated: Patient Education method: Explanation, Demonstration, Tactile cues, Verbal cues, and Handouts Education comprehension: verbalized understanding and returned demonstration  HOME EXERCISE PROGRAM: Access Code: [email or web address] URL: [email or web address] Date: 08/10/2024 Prepared by: Washington Scot  Exercises - Sit to Stand with Arms Crossed  - 2 x daily - 5 x weekly - 2 sets - 6-8 reps - Seated Posterior Pelvic Tilt  - 2 x daily - 5 x weekly - 2 sets - 8 reps - 3 hold - Seated Hip Abduction Blu TB  - 2 x daily - 5 x weekly - 2 sets - 6 reps - 3 hold -standing tandem stance holds 10s 2x4 -marches 2x8x3s (w/ PPT)   ASSESSMENT:  CLINICAL IMPRESSION:  Patient tolerated treatment with no increases in pain with progressions in BL hip and LE loading, SLS and DLS balancing via static and dynamic exercises. Current deficits include: BL hip, LE, and core strength alongside general static and dynamic balance. As a result, patient would continue to benefit from skilled PT to address said deficits via plan below.   EVAL: Patient is a 83 year old female who presents with unsteady gait. Patient presents with deficits in: balance, BL LE, core, and hip strength. As a result, the patient would benefit from skilled PT to address aforementioned deficits via plan below.   OBJECTIVE IMPAIRMENTS: decreased balance, decreased strength, postural dysfunction, and obesity.   ACTIVITY LIMITATIONS: bending, squatting, stairs, and reach over head  PERSONAL FACTORS: Age and 1  comorbidity:   are also affecting patient's functional outcome.   REHAB POTENTIAL: Fair    CLINICAL DECISION MAKING: Stable/uncomplicated  EVALUATION COMPLEXITY: Moderate   GOALS: Goals reviewed with patient? No  SHORT TERM GOALS: Target date: 08/31/2024   Patient will demonstrate 75% HEP compliance to show independence with self-management of condition  Baseline: 0% Goal status: INITIAL  2.  Patient will be able to report overall perceived balance at least 50%% capacity to demonstrate improvements in lower body stability, ADL completion, and overall QOL  Baseline: 25% Goal status: INITIAL    LONG TERM GOALS: Target date: 10/10/24  Patient will demonstrate 100% HEP compliance to show independence with self-management of condition  Baseline: 0% Goal status: INITIAL  2.  Patient will demonstrate a 9 point improvement in PSFS to show improvements in ADL completion and overall QOL   Baseline: 13 Goal status: INITIAL  3.  Patient will be able to report overall perceived balance at least 90% capacity to demonstrate improvements in lower body stability, ADL completion, and overall QOL  Baseline: 25% Goal status: INITIAL    PLAN:  PT FREQUENCY: 1-2x/week  PT DURATION: 8 weeks  PLANNED INTERVENTIONS: 97110-Therapeutic exercises, 97530- Therapeutic activity, 97112- Neuromuscular re-education, 97535- Self Care, [id number]- Manual therapy, [phone]- Gait training, Patient/Family education, Balance training, and Stair training  PLAN FOR NEXT SESSION: HEP assessment and progression, symptom modulation, and loading (isolated and/or functional). Manual therapy, aerobic, gait, and NME training as needed.     Washington Greener Jedrek Dinovo  PT, DPT  08/29/2024, 12:40 PM

## 2024-08-31 ENCOUNTER — Ambulatory Visit

## 2024-08-31 DIAGNOSIS — R2681 Unsteadiness on feet: Secondary | ICD-10-CM

## 2024-08-31 DIAGNOSIS — M6281 Muscle weakness (generalized): Secondary | ICD-10-CM

## 2024-08-31 NOTE — Therapy (Signed)
 OUTPATIENT PHYSICAL THERAPY LOWER EXTREMITY EVALUATION   Patient Name: Melinda Lucas MRN: [id number] DOB:21-Feb-1941, 83 y.o., female Today's Date: 08/31/2024  END OF SESSION:  PT End of Session - 08/31/24 1142     Visit Number 4    Number of Visits 16    Date for Recertification  10/10/24    Authorization Type MEDICARE/[employer] Supplemental    PT Start Time 1145    PT Stop Time 1230    PT Time Calculation (min) 45 min    Activity Tolerance Patient tolerated treatment well           Past Medical History:  Diagnosis Date   Arthritis    a little in my fingers (12/16/2016)   Atrial fibrillation (HCC)    GERD (gastroesophageal reflux disease)    Heart murmur    History of kidney stones    Melanoma in situ of lower leg (HCC)    Obesity (BMI 30-39.9)    OSA on CPAP    started it summer 2017    Osteopenia    Past Surgical History:  Procedure Laterality Date   CATARACT EXTRACTION W/ INTRAOCULAR LENS  IMPLANT, BILATERAL Bilateral ~ 2014   CHOLECYSTECTOMY N/A 12/17/2016   Procedure: LAPAROSCOPIC CHOLECYSTECTOMY;  Surgeon: Vicenta Poli, MD;  Location: [facility] OR;  Service: General;  Laterality: N/A;   COLONOSCOPY     DILATION AND CURETTAGE OF UTERUS  1970's   ELECTROPHYSIOLOGIC STUDY N/A 08/09/2015   Procedure: Atrial Fibrillation Ablation;  Surgeon: Lynwood Rakers, MD;  Location: [facility];  Service: Cardiovascular;  Laterality: N/A;   FRACTURE SURGERY     MELANOMA EXCISION Left 2010   leg   TEE WITHOUT CARDIOVERSION N/A 08/09/2015   Procedure: TRANSESOPHAGEAL ECHOCARDIOGRAM (TEE);  Surgeon: Oneil JAYSON Parchment, MD;  Location: [facility] ENDOSCOPY;  Service: Cardiovascular;  Laterality: N/A;   TONSILLECTOMY  1946   TUBAL LIGATION  1970's   WRIST FRACTURE SURGERY Left 2011   Patient Active Problem List   Diagnosis Date Noted   Impingement syndrome of right shoulder 02/25/2017   RUQ pain    Acute cholecystitis 12/16/2016   OSA (obstructive sleep apnea) 12/16/2016    Hypertension 12/16/2016   Sick sinus syndrome (HCC) 08/06/2015   Obesity (BMI 30-39.9) 07/14/2015   Paroxysmal atrial fibrillation (HCC)    Long term current use of anticoagulant therapy    Aortic arch atherosclerosis    Hyperlipidemia     PCP: Loreli Elsie JONETTA Mickey., MD Ref Provider (PCP)   REFERRING PROVIDER: Loreli Elsie JONETTA Mickey., MD Ref Provider (PCP)   REFERRING DIAG: R26.81 (ICD-10-CM) - Unsteady gait   THERAPY DIAG:  Muscle weakness (generalized)  Unsteady gait  Rationale for Evaluation and Treatment: Rehabilitation  ONSET DATE: 8 Months ago  SUBJECTIVE:   SUBJECTIVE STATEMENT:  Feeling good today. Reports doing HEP as able (son is over) and continuing to feel improvements in strength and balance.     Started 8 months ago with insidious start. Feel unsteady when stepping on curb or when having to look up on shelf.   PERTINENT HISTORY: Did agility with dog 2 years ago PAIN:  Are you having pain? Yes: NPRS scale: 10 pain not related to referral condition  Pain location:   Pain description: aches and pains but unrelated to PCP diagnosis Aggravating factors:   Relieving factors:    PRECAUTIONS: None  RED FLAGS: None   WEIGHT BEARING RESTRICTIONS: No  FALLS:  Has patient fallen in last 6 months? No  OCCUPATION:  PLOF: Independent  PATIENT GOALS: improve perceived steadiness, stair/curb navigation  OBJECTIVE:  Note: Objective measures were completed at Evaluation unless otherwise noted.   PATIENT SURVEYS:  PSFS: THE PATIENT SPECIFIC FUNCTIONAL SCALE  Place score of 0-10 (0 = unable to perform activity and 10 = able to perform activity at the same level as before injury or problem)  Activity Date: 10/22    Getting up after sitting a while 5    2.Stepping up or down (need rail) 4    3. Looking up in grocery store (off balance) 4    4.      Total Score 13      Total Score = Sum of activity scores/number of activities  Minimally Detectable  Change: 3 points (for single activity); 2 points (for average score)  [facility] (nd). The Patient Specific Functional Scale . Retrieved from [email or web address]   COGNITION: Overall cognitive status: Within functional limits for tasks assessed     SENSATION: WFL   POSTURE: rounded shoulders, forward head, and increased lumbar lordosis  PALPATION: N/a  LOWER EXTREMITY ROM:  Active ROM Right eval Left eval  Hip flexion WFL BL hips   Hip extension    Hip abduction    Hip adduction    Hip internal rotation    Hip external rotation    Knee flexion    Knee extension    Ankle dorsiflexion    Ankle plantarflexion    Ankle inversion    Ankle eversion     (Blank rows = not tested)  LOWER EXTREMITY MMT:  MMT Right eval Left eval  Hip flexion 4- 4-  Hip extension    Hip abduction 4- 4-  Hip adduction 4- 4-  Hip internal rotation    Hip external rotation    Knee flexion 4- 4-  Knee extension 4- 4-  Ankle dorsiflexion    Ankle plantarflexion    Ankle inversion    Ankle eversion     (Blank rows = not tested)    FUNCTIONAL TESTS:  Sharpened ROMBERG:  Pt reported feeling unsteady with feet together (even more with eyes closed) LOB within 10 seconds with semi tandem with eyes open  Unable to balance full tandem BL w/ eyes open                                                                                                                                  TREATMENT DATE:  Treatment 08/31/24: HEP reassessment and update Seated march x8x3s, Red TB x8x3s, x6x3s, x4x3s  STS 2x5 with 5#  green TB clamshells 2x10x3s, Black TB x6x3s   Neuromuscular Reeducation Semi tandem stance BL 2x6 w/ arms out alternating ball hand off  Standing march x8x3s (with 2 finger BL UE support) Standing calf raise x8x3s (single UE support) 6 inch step up x6 up and down (single UE support progressing to none; CG at all  times)  Did not do: deadlift Suitcase carry      Treatment 08/29/24: HEP reassessment and update Seated march x8x3s, x8x3s w/ PPT cuing STS x7 Blue TB clamshells 2x12x3s  Neuromuscular Reeducation Semi tandem stance 3x10s BL Semi tandem stance BL x6 w/ arms out alternating ball hand off Standing march 2x8x3s (with 2 finger BL UE support) Standing calf raise x8x3s (progressing to no UE support)       Treatment 08/24/24: HEP reassessment and update Seated PPT x8x3s STS x8 Blue TB clamshells 2x10x3s Seated hip march 2x8x3s   Neuromuscular Reeducation: Semi tandem (Alternating feet each set) 4x10x10s BL Semi tandem (alternating feet each set) w/ head turns BL 2x4  Alternating SLS 2x8x3s BL Walking 250' with marching, head turns, and number recognition  Did not do: Arm out alternating ball hand off Step ups 6 inch step 2x8x3s     PATIENT EDUCATION:  Education details: HEP Person educated: Patient Education method: Explanation, Demonstration, Tactile cues, Verbal cues, and Handouts Education comprehension: verbalized understanding and returned demonstration  HOME EXERCISE PROGRAM: Access Code: [email or web address] URL: [email or web address] Date: 08/10/2024 Prepared by: Washington Scot  Exercises - Sit to Stand with Arms Crossed  - 2 x daily - 5 x weekly - 2 sets - 6-8 reps - Seated Posterior Pelvic Tilt  - 2 x daily - 5 x weekly - 2 sets - 8 reps - 3 hold - Seated Hip Abduction Blu TB  - 2 x daily - 5 x weekly - 2 sets - 6 reps - 3 hold -standing tandem stance holds 10s 2x4 -marches 2x8x3s (w/ PPT)   ASSESSMENT:  CLINICAL IMPRESSION:  Patient tolerated treatment with no increases in pain with progressions in BL hip and LE loading, SLS and DLS balancing via static and dynamic exercises. Current deficits include: BL hip, LE, and core strength alongside general static and dynamic balance. As a result, patient would continue to benefit from skilled PT to  address said deficits via plan below.   EVAL: Patient is a 83 year old female who presents with unsteady gait. Patient presents with deficits in: balance, BL LE, core, and hip strength. As a result, the patient would benefit from skilled PT to address aforementioned deficits via plan below.   OBJECTIVE IMPAIRMENTS: decreased balance, decreased strength, postural dysfunction, and obesity.   ACTIVITY LIMITATIONS: bending, squatting, stairs, and reach over head  PERSONAL FACTORS: Age and 1 comorbidity:   are also affecting patient's functional outcome.   REHAB POTENTIAL: Fair    CLINICAL DECISION MAKING: Stable/uncomplicated  EVALUATION COMPLEXITY: Moderate   GOALS: Goals reviewed with patient? No  SHORT TERM GOALS: Target date: 08/31/2024   Patient will demonstrate 75% HEP compliance to show independence with self-management of condition  Baseline: 0% Goal status: INITIAL  2.  Patient will be able to report overall perceived balance at least 50%% capacity to demonstrate improvements in lower body stability, ADL completion, and overall QOL  Baseline: 25% Goal status: INITIAL    LONG TERM GOALS: Target date: 10/10/24  Patient will demonstrate 100% HEP compliance to show independence with self-management of condition  Baseline: 0% Goal status: INITIAL  2.  Patient will demonstrate a 9 point improvement in PSFS to show improvements in ADL completion and overall QOL   Baseline: 13 Goal status: INITIAL  3.  Patient will be able to report overall perceived balance at least 90% capacity to demonstrate improvements in lower body stability, ADL completion, and overall QOL  Baseline: 25% Goal status: INITIAL  PLAN:  PT FREQUENCY: 1-2x/week  PT DURATION: 8 weeks  PLANNED INTERVENTIONS: 97110-Therapeutic exercises, 97530- Therapeutic activity, [id number]- Neuromuscular re-education, 97535- Self Care, [id number]- Manual therapy, [phone]- Gait training, Patient/Family education,  Balance training, and Stair training  PLAN FOR NEXT SESSION: HEP assessment and progression, symptom modulation, and loading (isolated and/or functional). Manual therapy, aerobic, gait, and NME training as needed.     Washington Greener Margherita Collyer  PT, DPT  08/31/2024, 3:35 PM

## 2024-09-05 ENCOUNTER — Ambulatory Visit

## 2024-09-05 DIAGNOSIS — H3554 Dystrophies primarily involving the retinal pigment epithelium: Secondary | ICD-10-CM | POA: Diagnosis not present

## 2024-09-05 DIAGNOSIS — R2681 Unsteadiness on feet: Secondary | ICD-10-CM

## 2024-09-05 DIAGNOSIS — M6281 Muscle weakness (generalized): Secondary | ICD-10-CM | POA: Diagnosis not present

## 2024-09-05 DIAGNOSIS — H43812 Vitreous degeneration, left eye: Secondary | ICD-10-CM | POA: Diagnosis not present

## 2024-09-05 DIAGNOSIS — H18593 Other hereditary corneal dystrophies, bilateral: Secondary | ICD-10-CM | POA: Diagnosis not present

## 2024-09-05 DIAGNOSIS — H04123 Dry eye syndrome of bilateral lacrimal glands: Secondary | ICD-10-CM | POA: Diagnosis not present

## 2024-09-05 NOTE — Therapy (Signed)
 OUTPATIENT PHYSICAL THERAPY LOWER EXTREMITY EVALUATION   Patient Name: Melinda Lucas MRN: [id number] DOB:01/19/1941, 83 y.o., female Today's Date: 09/05/2024  END OF SESSION:  PT End of Session - 09/05/24 1144     Visit Number 5    Number of Visits 16    Date for Recertification  10/10/24    Authorization Type MEDICARE/[employer] Supplemental    PT Start Time 1145    PT Stop Time 1225    PT Time Calculation (min) 40 min    Activity Tolerance Patient tolerated treatment well           Past Medical History:  Diagnosis Date   Arthritis    a little in my fingers (12/16/2016)   Atrial fibrillation (HCC)    GERD (gastroesophageal reflux disease)    Heart murmur    History of kidney stones    Melanoma in situ of lower leg (HCC)    Obesity (BMI 30-39.9)    OSA on CPAP    started it summer 2017    Osteopenia    Past Surgical History:  Procedure Laterality Date   CATARACT EXTRACTION W/ INTRAOCULAR LENS  IMPLANT, BILATERAL Bilateral ~ 2014   CHOLECYSTECTOMY N/A 12/17/2016   Procedure: LAPAROSCOPIC CHOLECYSTECTOMY;  Surgeon: Vicenta Poli, MD;  Location: [facility] OR;  Service: General;  Laterality: N/A;   COLONOSCOPY     DILATION AND CURETTAGE OF UTERUS  1970's   ELECTROPHYSIOLOGIC STUDY N/A 08/09/2015   Procedure: Atrial Fibrillation Ablation;  Surgeon: Lynwood Rakers, MD;  Location: [facility];  Service: Cardiovascular;  Laterality: N/A;   FRACTURE SURGERY     MELANOMA EXCISION Left 2010   leg   TEE WITHOUT CARDIOVERSION N/A 08/09/2015   Procedure: TRANSESOPHAGEAL ECHOCARDIOGRAM (TEE);  Surgeon: Oneil JAYSON Parchment, MD;  Location: [facility] ENDOSCOPY;  Service: Cardiovascular;  Laterality: N/A;   TONSILLECTOMY  1946   TUBAL LIGATION  1970's   WRIST FRACTURE SURGERY Left 2011   Patient Active Problem List   Diagnosis Date Noted   Impingement syndrome of right shoulder 02/25/2017   RUQ pain    Acute cholecystitis 12/16/2016   OSA (obstructive sleep apnea) 12/16/2016    Hypertension 12/16/2016   Sick sinus syndrome (HCC) 08/06/2015   Obesity (BMI 30-39.9) 07/14/2015   Paroxysmal atrial fibrillation (HCC)    Long term current use of anticoagulant therapy    Aortic arch atherosclerosis    Hyperlipidemia     PCP: Loreli Elsie JONETTA Mickey., MD Ref Provider (PCP)   REFERRING PROVIDER: Loreli Elsie JONETTA Mickey., MD Ref Provider (PCP)   REFERRING DIAG: R26.81 (ICD-10-CM) - Unsteady gait   THERAPY DIAG:  Muscle weakness (generalized)  Unsteady gait  Rationale for Evaluation and Treatment: Rehabilitation  ONSET DATE: 8 Months ago  SUBJECTIVE:   SUBJECTIVE STATEMENT:  Pt reports being so busy with son and wife and walking around a lot and left foot hurting because of it. Reports not doing HEP as consistently due to be so busy (appx once a day)   Started 8 months ago with insidious start. Feel unsteady when stepping on curb or when having to look up on shelf.   PERTINENT HISTORY: Did agility with dog 2 years ago PAIN:   3/10 C with foot. Are you having pain? Yes: NPRS scale: 10 pain not related to referral condition  Pain location:   Pain description: aches and pains but unrelated to PCP diagnosis Aggravating factors:   Relieving factors:    PRECAUTIONS: None  RED FLAGS: None  WEIGHT BEARING RESTRICTIONS: No  FALLS:  Has patient fallen in last 6 months? No  OCCUPATION:   PLOF: Independent  PATIENT GOALS: improve perceived steadiness, stair/curb navigation  OBJECTIVE:  Note: Objective measures were completed at Evaluation unless otherwise noted.   PATIENT SURVEYS:  PSFS: THE PATIENT SPECIFIC FUNCTIONAL SCALE  Place score of 0-10 (0 = unable to perform activity and 10 = able to perform activity at the same level as before injury or problem)  Activity Date: 10/22    Getting up after sitting a while 5    2.Stepping up or down (need rail) 4    3. Looking up in grocery store (off balance) 4    4.      Total Score 13      Total  Score = Sum of activity scores/number of activities  Minimally Detectable Change: 3 points (for single activity); 2 points (for average score)  [facility] (nd). The Patient Specific Functional Scale . Retrieved from [email or web address]   COGNITION: Overall cognitive status: Within functional limits for tasks assessed     SENSATION: WFL   POSTURE: rounded shoulders, forward head, and increased lumbar lordosis  PALPATION: N/a  LOWER EXTREMITY ROM:  Active ROM Right eval Left eval  Hip flexion WFL BL hips   Hip extension    Hip abduction    Hip adduction    Hip internal rotation    Hip external rotation    Knee flexion    Knee extension    Ankle dorsiflexion    Ankle plantarflexion    Ankle inversion    Ankle eversion     (Blank rows = not tested)  LOWER EXTREMITY MMT:  MMT Right eval Left eval  Hip flexion 4- 4-  Hip extension    Hip abduction 4- 4-  Hip adduction 4- 4-  Hip internal rotation    Hip external rotation    Knee flexion 4- 4-  Knee extension 4- 4-  Ankle dorsiflexion    Ankle plantarflexion    Ankle inversion    Ankle eversion     (Blank rows = not tested)    FUNCTIONAL TESTS:  Sharpened ROMBERG:  Pt reported feeling unsteady with feet together (even more with eyes closed) LOB within 10 seconds with semi tandem with eyes open  Unable to balance full tandem BL w/ eyes open                                                                                                                                  TREATMENT DATE:  09/05/24 Therapeutic Exercise: HEP reassessment and update Seated march x8x3s, Red TB 2x8x3 Black TB clamshell 2x8x3s  Seated trunk rotation x8x3s   Neuromuscular Reeducation: 8 inch kb 5 #DL 2x8 Standing marches BL UE support x6x3s BL (p! In L ankle peroneals) RTB eversion 2x8x3s BL Standing calf raises x10x3s with single UE support  Did not  do: STS  5# deadlift Suitcase carry    Treatment 08/31/24: HEP reassessment and update Seated march x8x3s, Red TB x8x3s, x6x3s, x4x3s  STS 2x5 with 5#  green TB clamshells 2x10x3s, Black TB x6x3s   Neuromuscular Reeducation Semi tandem stance BL 2x6 w/ arms out alternating ball hand off  Standing march x8x3s (with 2 finger BL UE support) Standing calf raise x8x3s (single UE support) 6 inch step up x6 up and down (single UE support progressing to none; CG at all times)         Treatment 08/29/24: HEP reassessment and update Seated march x8x3s, x8x3s w/ PPT cuing STS x7 Blue TB clamshells 2x12x3s  Neuromuscular Reeducation Semi tandem stance 3x10s BL Semi tandem stance BL x6 w/ arms out alternating ball hand off Standing march 2x8x3s (with 2 finger BL UE support) Standing calf raise x8x3s (progressing to no UE support)       Treatment 08/24/24: HEP reassessment and update Seated PPT x8x3s STS x8 Blue TB clamshells 2x10x3s Seated hip march 2x8x3s   Neuromuscular Reeducation: Semi tandem (Alternating feet each set) 4x10x10s BL Semi tandem (alternating feet each set) w/ head turns BL 2x4  Alternating SLS 2x8x3s BL Walking 250' with marching, head turns, and number recognition  Did not do: Arm out alternating ball hand off Step ups 6 inch step 2x8x3s     PATIENT EDUCATION:  Education details: HEP Person educated: Patient Education method: Explanation, Demonstration, Tactile cues, Verbal cues, and Handouts Education comprehension: verbalized understanding and returned demonstration  HOME EXERCISE PROGRAM: Access Code: [email or web address] URL: [email or web address] Date: 08/10/2024 Prepared by: Washington Scot  Exercises - Sit to Stand with Arms Crossed  - 2 x daily - 5 x weekly - 2 sets - 6-8 reps - Seated Posterior Pelvic Tilt  - 2 x daily - 5 x weekly - 2 sets - 8 reps - 3 hold - Seated Hip Abduction Blu TB  - 2 x daily - 5 x weekly - 2 sets - 6  reps - 3 hold -standing tandem stance holds 10s 2x4 -marches 2x8x3s (w/ PPT) -red TB eversion seated   ASSESSMENT:  CLINICAL IMPRESSION:  Patient tolerated treatment with no increases in pain with progressions in BL hip and LE loading, SLS and DLS balancing via static and dynamic exercises. Current deficits include: BL hip, LE, and core strength alongside general static and dynamic balance. As a result, patient would continue to benefit from skilled PT to address said deficits via plan below.   EVAL: Patient is a 83 year old female who presents with unsteady gait. Patient presents with deficits in: balance, BL LE, core, and hip strength. As a result, the patient would benefit from skilled PT to address aforementioned deficits via plan below.   OBJECTIVE IMPAIRMENTS: decreased balance, decreased strength, postural dysfunction, and obesity.   ACTIVITY LIMITATIONS: bending, squatting, stairs, and reach over head  PERSONAL FACTORS: Age and 1 comorbidity:   are also affecting patient's functional outcome.   REHAB POTENTIAL: Fair    CLINICAL DECISION MAKING: Stable/uncomplicated  EVALUATION COMPLEXITY: Moderate   GOALS: Goals reviewed with patient? No  SHORT TERM GOALS: Target date: 08/31/2024   Patient will demonstrate 75% HEP compliance to show independence with self-management of condition  Baseline: 0% Goal status: INITIAL  2.  Patient will be able to report overall perceived balance at least 50%% capacity to demonstrate improvements in lower body stability, ADL completion, and overall QOL  Baseline: 25% Goal status: INITIAL    LONG TERM GOALS: Target date:  10/10/24  Patient will demonstrate 100% HEP compliance to show independence with self-management of condition  Baseline: 0% Goal status: INITIAL  2.  Patient will demonstrate a 9 point improvement in PSFS to show improvements in ADL completion and overall QOL   Baseline: 13 Goal status: INITIAL  3.  Patient  will be able to report overall perceived balance at least 90% capacity to demonstrate improvements in lower body stability, ADL completion, and overall QOL  Baseline: 25% Goal status: INITIAL    PLAN:  PT FREQUENCY: 1-2x/week  PT DURATION: 8 weeks  PLANNED INTERVENTIONS: 97110-Therapeutic exercises, 97530- Therapeutic activity, 97112- Neuromuscular re-education, 97535- Self Care, [id number]- Manual therapy, [phone]- Gait training, Patient/Family education, Balance training, and Stair training  PLAN FOR NEXT SESSION: HEP assessment and progression, symptom modulation, and loading (isolated and/or functional). Manual therapy, aerobic, gait, and NME training as needed.     Washington Greener Preethi Scantlebury  PT, DPT  09/05/2024, 12:27 PM

## 2024-09-06 LAB — GENECONNECT MOLECULAR SCREEN: Genetic Analysis Overall Interpretation: NEGATIVE

## 2024-09-07 ENCOUNTER — Ambulatory Visit

## 2024-09-12 ENCOUNTER — Ambulatory Visit

## 2024-09-12 DIAGNOSIS — M6281 Muscle weakness (generalized): Secondary | ICD-10-CM

## 2024-09-12 DIAGNOSIS — R2681 Unsteadiness on feet: Secondary | ICD-10-CM | POA: Diagnosis not present

## 2024-09-12 NOTE — Therapy (Signed)
 OUTPATIENT PHYSICAL THERAPY LOWER EXTREMITY EVALUATION   Patient Name: Melinda Lucas MRN: [id number] DOB:[birth year], 83 y.o., female Today's Date: 09/12/2024  END OF SESSION:  PT End of Session - 09/12/24 1106     Visit Number 6    Number of Visits 16    Date for Recertification  10/10/24    Authorization Type MEDICARE/[employer] Supplemental    PT Start Time 1100    PT Stop Time 1140    PT Time Calculation (min) 40 min    Activity Tolerance Patient tolerated treatment well            Past Medical History:  Diagnosis Date   Arthritis    a little in my fingers (12/16/2016)   Atrial fibrillation (HCC)    GERD (gastroesophageal reflux disease)    Heart murmur    History of kidney stones    Melanoma in situ of lower leg (HCC)    Obesity (BMI 30-39.9)    OSA on CPAP    started it summer 2017    Osteopenia    Past Surgical History:  Procedure Laterality Date   CATARACT EXTRACTION W/ INTRAOCULAR LENS  IMPLANT, BILATERAL Bilateral ~ 2014   CHOLECYSTECTOMY N/A 12/17/2016   Procedure: LAPAROSCOPIC CHOLECYSTECTOMY;  Surgeon: Vicenta Poli, MD;  Location: [facility] OR;  Service: General;  Laterality: N/A;   COLONOSCOPY     DILATION AND CURETTAGE OF UTERUS  1970's   ELECTROPHYSIOLOGIC STUDY N/A 08/09/2015   Procedure: Atrial Fibrillation Ablation;  Surgeon: Lynwood Rakers, MD;  Location: [facility] INVASIVE CV LAB;  Service: Cardiovascular;  Laterality: N/A;   FRACTURE SURGERY     MELANOMA EXCISION Left 2010   leg   TEE WITHOUT CARDIOVERSION N/A 08/09/2015   Procedure: TRANSESOPHAGEAL ECHOCARDIOGRAM (TEE);  Surgeon: Oneil JAYSON Parchment, MD;  Location: [facility] ENDOSCOPY;  Service: Cardiovascular;  Laterality: N/A;   TONSILLECTOMY  1946   TUBAL LIGATION  1970's   WRIST FRACTURE SURGERY Left 2011   Patient Active Problem List   Diagnosis Date Noted   Impingement syndrome of right shoulder 02/25/2017   RUQ pain    Acute cholecystitis 12/16/2016   OSA (obstructive sleep apnea) 12/16/2016    Hypertension 12/16/2016   Sick sinus syndrome (HCC) 08/06/2015   Obesity (BMI 30-39.9) 07/14/2015   Paroxysmal atrial fibrillation (HCC)    Long term current use of anticoagulant therapy    Aortic arch atherosclerosis    Hyperlipidemia     PCP: Loreli Elsie JONETTA Mickey., MD Ref Provider (PCP)   REFERRING PROVIDER: Loreli Elsie JONETTA Mickey., MD Ref Provider (PCP)   REFERRING DIAG: R26.81 (ICD-10-CM) - Unsteady gait   THERAPY DIAG:  Muscle weakness (generalized)  Unsteady gait  Rationale for Evaluation and Treatment: Rehabilitation  ONSET DATE: 8 Months ago  SUBJECTIVE:   SUBJECTIVE STATEMENT:  Pt reports being busy still with thanksgiving; however, has been trying to space out HEP. Reports feeling more balanced as time goes on.   EVAL: Started 8 months ago with insidious start. Feel unsteady when stepping on curb or when having to look up on shelf.   PERTINENT HISTORY: Did agility with dog 2 years ago PAIN:   3/10 C with foot. Are you having pain? Yes: NPRS scale: 10 pain not related to referral condition  Pain location:   Pain description: aches and pains but unrelated to PCP diagnosis Aggravating factors:   Relieving factors:    PRECAUTIONS: None  RED FLAGS: None   WEIGHT BEARING RESTRICTIONS: No  FALLS:  Has patient fallen  in last 6 months? No  OCCUPATION:   PLOF: Independent  PATIENT GOALS: improve perceived steadiness, stair/curb navigation  OBJECTIVE:  Note: Objective measures were completed at Evaluation unless otherwise noted.   PATIENT SURVEYS:  PSFS: THE PATIENT SPECIFIC FUNCTIONAL SCALE  Place score of 0-10 (0 = unable to perform activity and 10 = able to perform activity at the same level as before injury or problem)  Activity Date: 10/22    Getting up after sitting a while 5    2.Stepping up or down (need rail) 4    3. Looking up in grocery store (off balance) 4    4.      Total Score 13      Total Score = Sum of activity scores/number of  activities  Minimally Detectable Change: 3 points (for single activity); 2 points (for average score)  [facility] (nd). The Patient Specific Functional Scale . Retrieved from [email or web address]   COGNITION: Overall cognitive status: Within functional limits for tasks assessed     SENSATION: WFL   POSTURE: rounded shoulders, forward head, and increased lumbar lordosis  PALPATION: N/a  LOWER EXTREMITY ROM:  Active ROM Right eval Left eval  Hip flexion WFL BL hips   Hip extension    Hip abduction    Hip adduction    Hip internal rotation    Hip external rotation    Knee flexion    Knee extension    Ankle dorsiflexion    Ankle plantarflexion    Ankle inversion    Ankle eversion     (Blank rows = not tested)  LOWER EXTREMITY MMT:  MMT Right eval Left eval  Hip flexion 4- 4-  Hip extension    Hip abduction 4- 4-  Hip adduction 4- 4-  Hip internal rotation    Hip external rotation    Knee flexion 4- 4-  Knee extension 4- 4-  Ankle dorsiflexion    Ankle plantarflexion    Ankle inversion    Ankle eversion     (Blank rows = not tested)    FUNCTIONAL TESTS:  Sharpened ROMBERG:  Pt reported feeling unsteady with feet together (even more with eyes closed) LOB within 10 seconds with semi tandem with eyes open  Unable to balance full tandem BL w/ eyes open                                                                                                                                  TREATMENT DATE:  09/12/24  Therapeutic Exercise: HEP reassessment and update Seated march Red TB x8x3, GTB x8x3s, Blue TB x8x3s Black TB clamshell x10x3s  Supine glute bridge 2x8x3s   Neuromuscular Reeducation: 8 inch kb 10 #DL x8 Standing marches BL UE support (2 fingers each) 2x8x3s BL  Standing calf raises x12x3s with single UE support 8 inch step up and down x8 (with S UE support first 2 reps, none  the last) contact guard  Did not do: STS 5# Suitcase carry      09/05/24  Therapeutic Exercise: HEP reassessment and update Seated march x8x3s,Red TB 2x8x3 Black TB clamshell 2x8x3s  Seated trunk rotation x8x3s   Neuromuscular Reeducation: 8 inch kb 5 #DL 2x8 Standing marches BL UE support x6x3s BL (p! In L ankle peroneals) RTB eversion 2x8x3s BL Standing calf raises x10x3s with single UE support  Did not do: STS 5# deadlift Suitcase carry    Treatment 08/31/24: HEP reassessment and update Seated march x8x3s, Red TB x8x3s, x6x3s, x4x3s  STS 2x5 with 5#  green TB clamshells 2x10x3s, Black TB x6x3s   Neuromuscular Reeducation Semi tandem stance BL 2x6 w/ arms out alternating ball hand off  Standing march x8x3s (with 2 finger BL UE support) Standing calf raise x8x3s (single UE support) 6 inch step up x6 up and down (single UE support progressing to none; CG at all times)         Treatment 08/29/24: HEP reassessment and update Seated march x8x3s, x8x3s w/ PPT cuing STS x7 Blue TB clamshells 2x12x3s  Neuromuscular Reeducation Semi tandem stance 3x10s BL Semi tandem stance BL x6 w/ arms out alternating ball hand off Standing march 2x8x3s (with 2 finger BL UE support) Standing calf raise x8x3s (progressing to no UE support)       Treatment 08/24/24: HEP reassessment and update Seated PPT x8x3s STS x8 Blue TB clamshells 2x10x3s Seated hip march 2x8x3s   Neuromuscular Reeducation: Semi tandem (Alternating feet each set) 4x10x10s BL Semi tandem (alternating feet each set) w/ head turns BL 2x4  Alternating SLS 2x8x3s BL Walking 250' with marching, head turns, and number recognition  Did not do: Arm out alternating ball hand off Step ups 6 inch step 2x8x3s     PATIENT EDUCATION:  Education details: HEP Person educated: Patient Education method: Explanation, Demonstration, Tactile cues, Verbal cues, and Handouts Education comprehension:  verbalized understanding and returned demonstration  HOME EXERCISE PROGRAM: Access Code: [email or web address] URL: [email or web address] Date: 08/10/2024 Prepared by: Washington Scot  Exercises - Sit to Stand with Arms Crossed  - 2 x daily - 5 x weekly - 2 sets - 6-8 reps - Seated Posterior Pelvic Tilt  - 2 x daily - 5 x weekly - 2 sets - 8 reps - 3 hold -standing tandem stance holds 10s 2x4 -marches 2x8x3s (w/ PPT) w/ band -red TB eversion seated Glute bridges  Calf raises   ASSESSMENT:  CLINICAL IMPRESSION:  Patient tolerated treatment with no increases in pain with progressions in BL hip and LE loading, SLS and DLS balancing via static and dynamic exercises. Current deficits include: BL hip, LE, and core strength alongside general static and dynamic balance. As a result, patient would continue to benefit from skilled PT to address said deficits via plan below.   EVAL: Patient is a 83 year old female who presents with unsteady gait. Patient presents with deficits in: balance, BL LE, core, and hip strength. As a result, the patient would benefit from skilled PT to address aforementioned deficits via plan below.   OBJECTIVE IMPAIRMENTS: decreased balance, decreased strength, postural dysfunction, and obesity.   ACTIVITY LIMITATIONS: bending, squatting, stairs, and reach over head  PERSONAL FACTORS: Age and 1 comorbidity:   are also affecting patient's functional outcome.   REHAB POTENTIAL: Fair    CLINICAL DECISION MAKING: Stable/uncomplicated  EVALUATION COMPLEXITY: Moderate   GOALS: Goals reviewed with patient? No  SHORT TERM GOALS: Target date: 08/31/2024  Patient will demonstrate 75% HEP compliance to show independence with self-management of condition  Baseline: 0% Goal status: INITIAL  2.  Patient will be able to report overall perceived balance at least 50%% capacity to demonstrate improvements in lower body stability, ADL completion, and overall  QOL  Baseline: 25% Goal status: INITIAL    LONG TERM GOALS: Target date: 10/10/24  Patient will demonstrate 100% HEP compliance to show independence with self-management of condition  Baseline: 0% Goal status: INITIAL  2.  Patient will demonstrate a 9 point improvement in PSFS to show improvements in ADL completion and overall QOL   Baseline: 13 Goal status: INITIAL  3.  Patient will be able to report overall perceived balance at least 90% capacity to demonstrate improvements in lower body stability, ADL completion, and overall QOL  Baseline: 25% Goal status: INITIAL    PLAN:  PT FREQUENCY: 1-2x/week  PT DURATION: 8 weeks  PLANNED INTERVENTIONS: 97110-Therapeutic exercises, 97530- Therapeutic activity, 97112- Neuromuscular re-education, 97535- Self Care, [id number]- Manual therapy, [phone]- Gait training, Patient/Family education, Balance training, and Stair training  PLAN FOR NEXT SESSION: HEP assessment and progression, symptom modulation, and loading (isolated and/or functional). Manual therapy, aerobic, gait, and NME training as needed.     Washington Greener Zuleyma Scharf  PT, DPT  09/12/2024, 1:11 PM

## 2024-09-21 ENCOUNTER — Ambulatory Visit: Attending: Internal Medicine

## 2024-09-21 DIAGNOSIS — M6281 Muscle weakness (generalized): Secondary | ICD-10-CM | POA: Diagnosis present

## 2024-09-21 DIAGNOSIS — R2681 Unsteadiness on feet: Secondary | ICD-10-CM | POA: Diagnosis present

## 2024-09-21 NOTE — Therapy (Signed)
 OUTPATIENT PHYSICAL THERAPY LOWER EXTREMITY EVALUATION   Patient Name: Melinda Lucas MRN: [id number] DOB:1941/09/30, 83 y.o., female Today's Date: 09/21/2024  END OF SESSION:  PT End of Session - 09/21/24 2039     Visit Number 7    Number of Visits 16    Date for Recertification  10/10/24    Authorization Type MEDICARE/[employer] Supplemental    PT Start Time 1400    PT Stop Time 1440    PT Time Calculation (min) 40 min    Activity Tolerance Patient tolerated treatment well             Past Medical History:  Diagnosis Date   Arthritis    a little in my fingers (12/16/2016)   Atrial fibrillation (HCC)    GERD (gastroesophageal reflux disease)    Heart murmur    History of kidney stones    Melanoma in situ of lower leg (HCC)    Obesity (BMI 30-39.9)    OSA on CPAP    started it summer 2017    Osteopenia    Past Surgical History:  Procedure Laterality Date   CATARACT EXTRACTION W/ INTRAOCULAR LENS  IMPLANT, BILATERAL Bilateral ~ 2014   CHOLECYSTECTOMY N/A 12/17/2016   Procedure: LAPAROSCOPIC CHOLECYSTECTOMY;  Surgeon: Vicenta Poli, MD;  Location: [facility] OR;  Service: General;  Laterality: N/A;   COLONOSCOPY     DILATION AND CURETTAGE OF UTERUS  1970's   ELECTROPHYSIOLOGIC STUDY N/A 08/09/2015   Procedure: Atrial Fibrillation Ablation;  Surgeon: Lynwood Rakers, MD;  Location: [facility];  Service: Cardiovascular;  Laterality: N/A;   FRACTURE SURGERY     MELANOMA EXCISION Left 2010   leg   TEE WITHOUT CARDIOVERSION N/A 08/09/2015   Procedure: TRANSESOPHAGEAL ECHOCARDIOGRAM (TEE);  Surgeon: Oneil JAYSON Parchment, MD;  Location: [facility] ENDOSCOPY;  Service: Cardiovascular;  Laterality: N/A;   TONSILLECTOMY  1946   TUBAL LIGATION  1970's   WRIST FRACTURE SURGERY Left 2011   Patient Active Problem List   Diagnosis Date Noted   Impingement syndrome of right shoulder 02/25/2017   RUQ pain    Acute cholecystitis 12/16/2016   OSA (obstructive sleep apnea) 12/16/2016    Hypertension 12/16/2016   Sick sinus syndrome (HCC) 08/06/2015   Obesity (BMI 30-39.9) 07/14/2015   Paroxysmal atrial fibrillation (HCC)    Long term current use of anticoagulant therapy    Aortic arch atherosclerosis    Hyperlipidemia     PCP: Loreli Elsie JONETTA Mickey., MD Ref Provider (PCP)   REFERRING PROVIDER: Loreli Elsie JONETTA Mickey., MD Ref Provider (PCP)   REFERRING DIAG: R26.81 (ICD-10-CM) - Unsteady gait   THERAPY DIAG:  Muscle weakness (generalized)  Unsteady gait  Rationale for Evaluation and Treatment: Rehabilitation  ONSET DATE: 8 Months ago  SUBJECTIVE:   SUBJECTIVE STATEMENT:  Pt reports being busy with moving into new home. Reports feeling more balanced as time goes on. Does hep consistently.     EVAL: Started 8 months ago with insidious start. Feel unsteady when stepping on curb or when having to look up on shelf.   PERTINENT HISTORY: Did agility with dog 2 years ago PAIN:   0C Are you having pain? Yes: NPRS scale: 10 pain not related to referral condition  Pain location:   Pain description: aches and pains but unrelated to PCP diagnosis Aggravating factors:   Relieving factors:    PRECAUTIONS: None  RED FLAGS: None   WEIGHT BEARING RESTRICTIONS: No  FALLS:  Has patient fallen in last 6  months? No  OCCUPATION:   PLOF: Independent  PATIENT GOALS: improve perceived steadiness, stair/curb navigation  OBJECTIVE:  Note: Objective measures were completed at Evaluation unless otherwise noted.   PATIENT SURVEYS:  PSFS: THE PATIENT SPECIFIC FUNCTIONAL SCALE  Place score of 0-10 (0 = unable to perform activity and 10 = able to perform activity at the same level as before injury or problem)  Activity Date: 10/22    Getting up after sitting a while 5    2.Stepping up or down (need rail) 4    3. Looking up in grocery store (off balance) 4    4.      Total Score 13      Total Score = Sum of activity scores/number of activities  Minimally  Detectable Change: 3 points (for single activity); 2 points (for average score)  [facility] (nd). The Patient Specific Functional Scale . Retrieved from [email or web address]   COGNITION: Overall cognitive status: Within functional limits for tasks assessed     SENSATION: WFL   POSTURE: rounded shoulders, forward head, and increased lumbar lordosis  PALPATION: N/a  LOWER EXTREMITY ROM:  Active ROM Right eval Left eval  Hip flexion WFL BL hips   Hip extension    Hip abduction    Hip adduction    Hip internal rotation    Hip external rotation    Knee flexion    Knee extension    Ankle dorsiflexion    Ankle plantarflexion    Ankle inversion    Ankle eversion     (Blank rows = not tested)  LOWER EXTREMITY MMT:  MMT Right eval Left eval  Hip flexion 4- 4-  Hip extension    Hip abduction 4- 4-  Hip adduction 4- 4-  Hip internal rotation    Hip external rotation    Knee flexion 4- 4-  Knee extension 4- 4-  Ankle dorsiflexion    Ankle plantarflexion    Ankle inversion    Ankle eversion     (Blank rows = not tested)    FUNCTIONAL TESTS:  Sharpened ROMBERG:  Pt reported feeling unsteady with feet together (even more with eyes closed) LOB within 10 seconds with semi tandem with eyes open  Unable to balance full tandem BL w/ eyes open                                                                                                                                  TREATMENT DATE:  09/21/24  Therapeutic Exercise: HEP reassessment and update Seated marches blue TB x8x3s, black TB x4x3s Supine glute bridge 2x10x3s   Neuromuscular Reeducation: STS x3, x4 w/ 10# 5# #DL x5 from floor Standing calf raises x12x3s with single UE support Tandem stance BL 2x10s with countertop support   Did not do: Suitcase carry 5# Standing marches BL UE support (2 fingers each) 2x8x3s BL  STS 5# 8 inch step  up  and down x8 (with S UE support first 2 reps, none the last) contact guard  09/12/24  Therapeutic Exercise: HEP reassessment and update Seated march Red TB x8x3, GTB x8x3s, Blue TB x8x3s Black TB clamshell x10x3s  Supine glute bridge 2x8x3s   Neuromuscular Reeducation: 8 inch kb 10 #DL x8 Standing marches BL UE support (2 fingers each) 2x8x3s BL  Standing calf raises x12x3s with single UE support 8 inch step up and down x8 (with S UE support first 2 reps, none the last) contact guard  Did not do: STS 5# Suitcase carry      09/05/24  Therapeutic Exercise: HEP reassessment and update Seated march x8x3s,Red TB 2x8x3 Black TB clamshell 2x8x3s  Seated trunk rotation x8x3s   Neuromuscular Reeducation: 8 inch kb 5 #DL 2x8 Standing marches BL UE support x6x3s BL (p! In L ankle peroneals) RTB eversion 2x8x3s BL Standing calf raises x10x3s with single UE support  Did not do: STS 5# deadlift Suitcase carry    Treatment 08/31/24: HEP reassessment and update Seated march x8x3s, Red TB x8x3s, x6x3s, x4x3s  STS 2x5 with 5#  green TB clamshells 2x10x3s, Black TB x6x3s   Neuromuscular Reeducation Semi tandem stance BL 2x6 w/ arms out alternating ball hand off  Standing march x8x3s (with 2 finger BL UE support) Standing calf raise x8x3s (single UE support) 6 inch step up x6 up and down (single UE support progressing to none; CG at all times)     PATIENT EDUCATION:  Education details: HEP Person educated: Patient Education method: Explanation, Demonstration, Tactile cues, Verbal cues, and Handouts Education comprehension: verbalized understanding and returned demonstration  HOME EXERCISE PROGRAM: Access Code: [email or web address] URL: [email or web address] Date: 08/10/2024 Prepared by: Washington Scot  Exercises - Sit to Stand with Arms Crossed  - 2 x daily - 5 x weekly - 2 sets - 6-8 reps - Seated Posterior Pelvic Tilt  - 2 x daily - 5 x weekly - 2 sets - 8  reps - 3 hold -standing tandem stance holds 10s 2x4 -marches 2x8x3s (w/ PPT) w/ band Glute bridges  Calf raises   ASSESSMENT:  CLINICAL IMPRESSION:  Patient tolerated treatment with no increases in pain with progressions in BL hip and LE loading, SLS and DLS balancing via static and dynamic exercises. Current deficits include: BL hip, LE, and core strength alongside general static and dynamic balance. As a result, patient would continue to benefit from skilled PT to address said deficits via plan below.   EVAL: Patient is a 83 year old female who presents with unsteady gait. Patient presents with deficits in: balance, BL LE, core, and hip strength. As a result, the patient would benefit from skilled PT to address aforementioned deficits via plan below.   OBJECTIVE IMPAIRMENTS: decreased balance, decreased strength, postural dysfunction, and obesity.   ACTIVITY LIMITATIONS: bending, squatting, stairs, and reach over head  PERSONAL FACTORS: Age and 1 comorbidity:   are also affecting patient's functional outcome.   REHAB POTENTIAL: Fair    CLINICAL DECISION MAKING: Stable/uncomplicated  EVALUATION COMPLEXITY: Moderate   GOALS: Goals reviewed with patient? No  SHORT TERM GOALS: Target date: 08/31/2024   Patient will demonstrate 75% HEP compliance to show independence with self-management of condition  Baseline: 0% Goal status: INITIAL  2.  Patient will be able to report overall perceived balance at least 50%% capacity to demonstrate improvements in lower body stability, ADL completion, and overall QOL  Baseline: 25% Goal status: INITIAL    LONG TERM GOALS: Target  date: 10/10/24  Patient will demonstrate 100% HEP compliance to show independence with self-management of condition  Baseline: 0% Goal status: INITIAL  2.  Patient will demonstrate a 9 point improvement in PSFS to show improvements in ADL completion and overall QOL   Baseline: 13 Goal status:  INITIAL  3.  Patient will be able to report overall perceived balance at least 90% capacity to demonstrate improvements in lower body stability, ADL completion, and overall QOL  Baseline: 25% Goal status: INITIAL    PLAN:  PT FREQUENCY: 1-2x/week  PT DURATION: 8 weeks  PLANNED INTERVENTIONS: 97110-Therapeutic exercises, 97530- Therapeutic activity, 97112- Neuromuscular re-education, 97535- Self Care, [id number]- Manual therapy, [phone]- Gait training, Patient/Family education, Balance training, and Stair training  PLAN FOR NEXT SESSION: HEP assessment and progression, symptom modulation, and loading (isolated and/or functional). Manual therapy, aerobic, gait, and NME training as needed.     Washington Greener Ashani Pumphrey  PT, DPT  09/21/2024, 8:44 PM

## 2024-09-28 ENCOUNTER — Ambulatory Visit

## 2024-09-28 DIAGNOSIS — R2681 Unsteadiness on feet: Secondary | ICD-10-CM

## 2024-09-28 DIAGNOSIS — M6281 Muscle weakness (generalized): Secondary | ICD-10-CM

## 2024-09-28 NOTE — Therapy (Addendum)
 " OUTPATIENT PHYSICAL THERAPY LOWER EXTREMITY EVALUATION PHYSICAL THERAPY UNPLANNED DISCHARGE SUMMARY   Visits from Start of Care: 8  Current functional level related to goals / functional outcomes: Current status unknown   Remaining deficits: Current status unknown   Education / Equipment: Pt has not returned since visit listed below  Patient goals were not assessed. Patient is being discharged due to not returning since the last visit.  (the note below was addended to include the above D/C summary on 11/17/24)   Patient Name: Melinda Lucas MRN: [id number] DOB:[birth year], 83 y.o., female Today's Date: 09/28/2024  END OF SESSION:  PT End of Session - 09/28/24 1706     Visit Number 8    Number of Visits 16    Date for Recertification  10/10/24    Authorization Type MEDICARE/[employer] Supplemental    PT Start Time 1700    PT Stop Time 1740    PT Time Calculation (min) 40 min    Activity Tolerance Patient tolerated treatment well             Past Medical History:  Diagnosis Date   Arthritis    a little in my fingers (12/16/2016)   Atrial fibrillation (HCC)    GERD (gastroesophageal reflux disease)    Heart murmur    History of kidney stones    Melanoma in situ of lower leg (HCC)    Obesity (BMI 30-39.9)    OSA on CPAP    started it summer 2017    Osteopenia    Past Surgical History:  Procedure Laterality Date   CATARACT EXTRACTION W/ INTRAOCULAR LENS  IMPLANT, BILATERAL Bilateral ~ 2014   CHOLECYSTECTOMY N/A 12/17/2016   Procedure: LAPAROSCOPIC CHOLECYSTECTOMY;  Surgeon: Vicenta Poli, MD;  Location: [facility] OR;  Service: General;  Laterality: N/A;   COLONOSCOPY     DILATION AND CURETTAGE OF UTERUS  1970's   ELECTROPHYSIOLOGIC STUDY N/A 08/09/2015   Procedure: Atrial Fibrillation Ablation;  Surgeon: Lynwood Rakers, MD;  Location: [facility];  Service: Cardiovascular;  Laterality: N/A;   FRACTURE SURGERY     MELANOMA EXCISION Left 2010   leg   TEE  WITHOUT CARDIOVERSION N/A 08/09/2015   Procedure: TRANSESOPHAGEAL ECHOCARDIOGRAM (TEE);  Surgeon: Oneil JAYSON Parchment, MD;  Location: [facility] ENDOSCOPY;  Service: Cardiovascular;  Laterality: N/A;   TONSILLECTOMY  1946   TUBAL LIGATION  1970's   WRIST FRACTURE SURGERY Left 2011   Patient Active Problem List   Diagnosis Date Noted   Impingement syndrome of right shoulder 02/25/2017   RUQ pain    Acute cholecystitis 12/16/2016   OSA (obstructive sleep apnea) 12/16/2016   Hypertension 12/16/2016   Sick sinus syndrome (HCC) 08/06/2015   Obesity (BMI 30-39.9) 07/14/2015   Paroxysmal atrial fibrillation (HCC)    Long term current use of anticoagulant therapy    Aortic arch atherosclerosis    Hyperlipidemia     PCP: Loreli Elsie JONETTA Mickey., MD Ref Provider (PCP)   REFERRING PROVIDER: Loreli Elsie JONETTA Mickey., MD Ref Provider (PCP)   REFERRING DIAG: R26.81 (ICD-10-CM) - Unsteady gait   THERAPY DIAG:  Muscle weakness (generalized)  Unsteady gait  Rationale for Evaluation and Treatment: Rehabilitation  ONSET DATE: 8 Months ago  SUBJECTIVE:   SUBJECTIVE STATEMENT:  Pt reports being busy with other errands. Reports feeling more balanced as time goes on. Does hep consistently.     EVAL: Started 8 months ago with insidious start. Feel unsteady when stepping on curb or when having to look  up on shelf.   PERTINENT HISTORY: Did agility with dog 2 years ago PAIN:   0C Are you having pain? Yes: NPRS scale: 10 pain not related to referral condition  Pain location:   Pain description: aches and pains but unrelated to PCP diagnosis Aggravating factors:   Relieving factors:    PRECAUTIONS: None  RED FLAGS: None   WEIGHT BEARING RESTRICTIONS: No  FALLS:  Has patient fallen in last 6 months? No  OCCUPATION:   PLOF: Independent  PATIENT GOALS: improve perceived steadiness, stair/curb navigation  OBJECTIVE:  Note: Objective measures were completed at Evaluation unless otherwise  noted.   PATIENT SURVEYS:  PSFS: THE PATIENT SPECIFIC FUNCTIONAL SCALE  Place score of 0-10 (0 = unable to perform activity and 10 = able to perform activity at the same level as before injury or problem)  Activity Date: 10/22    Getting up after sitting a while 5    2.Stepping up or down (need rail) 4    3. Looking up in grocery store (off balance) 4    4.      Total Score 13      Total Score = Sum of activity scores/number of activities  Minimally Detectable Change: 3 points (for single activity); 2 points (for average score)  [facility] (nd). The Patient Specific Functional Scale . Retrieved from [email or web address]   COGNITION: Overall cognitive status: Within functional limits for tasks assessed     SENSATION: WFL   POSTURE: rounded shoulders, forward head, and increased lumbar lordosis  PALPATION: N/a  LOWER EXTREMITY ROM:  Active ROM Right eval Left eval  Hip flexion WFL BL hips   Hip extension    Hip abduction    Hip adduction    Hip internal rotation    Hip external rotation    Knee flexion    Knee extension    Ankle dorsiflexion    Ankle plantarflexion    Ankle inversion    Ankle eversion     (Blank rows = not tested)  LOWER EXTREMITY MMT:  MMT Right eval Left eval  Hip flexion 4- 4-  Hip extension    Hip abduction 4- 4-  Hip adduction 4- 4-  Hip internal rotation    Hip external rotation    Knee flexion 4- 4-  Knee extension 4- 4-  Ankle dorsiflexion    Ankle plantarflexion    Ankle inversion    Ankle eversion     (Blank rows = not tested)    FUNCTIONAL TESTS:  Sharpened ROMBERG:  Pt reported feeling unsteady with feet together (even more with eyes closed) LOB within 10 seconds with semi tandem with eyes open  Unable to balance full tandem BL w/ eyes open  TREATMENT DATE:  09/28/24  Therapeutic Exercise: HEP reassessment and update    Neuromuscular Reeducation: RTB clamshell x8x3s Black TB clamshell x6x3s, x8x3s STS 2x6 w/ 10# Stairs (peds area) x6 up and down (x1 rail B UE, x1 SUE, x4 no UE use) Ambulate 150' with hip march and uneven terrain navigation Standing calf raises 2x8x3s  Standing marches with 1s hold x8     Did not do: Tandem stance BL 2x10s with countertop support Suitcase carry 5# Standing marches BL UE support (2 fingers each) 2x8x3s BL  STS 5# 8 inch step up and down x8 (with S UE support first 2 reps, none the last) contact guard Seated marches blue TB x8x3s, black TB x4x3s Supine glute bridge 2x10x3s  09/12/24  Therapeutic Exercise: HEP reassessment and update Seated march Red TB x8x3, GTB x8x3s, Blue TB x8x3s Black TB clamshell x10x3s  Supine glute bridge 2x8x3s   Neuromuscular Reeducation: 8 inch kb 10 #DL x8 Standing marches BL UE support (2 fingers each) 2x8x3s BL  Standing calf raises x12x3s with single UE support 8 inch step up and down x8 (with S UE support first 2 reps, none the last) contact guard  Did not do: STS 5# Suitcase carry      09/05/24  Therapeutic Exercise: HEP reassessment and update Seated march x8x3s,Red TB 2x8x3 Black TB clamshell 2x8x3s  Seated trunk rotation x8x3s   Neuromuscular Reeducation: 8 inch kb 5 #DL 2x8 Standing marches BL UE support x6x3s BL (p! In L ankle peroneals) RTB eversion 2x8x3s BL Standing calf raises x10x3s with single UE support  Did not do: STS 5# deadlift Suitcase carry    Treatment 08/31/24: HEP reassessment and update Seated march x8x3s, Red TB x8x3s, x6x3s, x4x3s  STS 2x5 with 5#  green TB clamshells 2x10x3s, Black TB x6x3s   Neuromuscular Reeducation Semi tandem stance BL 2x6 w/ arms out alternating ball hand off  Standing march x8x3s (with 2 finger BL UE support) Standing calf raise x8x3s  (single UE support) 6 inch step up x6 up and down (single UE support progressing to none; CG at all times)     PATIENT EDUCATION:  Education details: HEP Person educated: Patient Education method: Explanation, Demonstration, Tactile cues, Verbal cues, and Handouts Education comprehension: verbalized understanding and returned demonstration  HOME EXERCISE PROGRAM: Access Code: [email or web address] URL: [email or web address] Date: 08/10/2024 Prepared by: Washington Scot  Exercises - Sit to Stand with Arms Crossed  - 2 x daily - 5 x weekly - 2 sets - 6-8 reps - Seated Posterior Pelvic Tilt  - 2 x daily - 5 x weekly - 2 sets - 8 reps - 3 hold -standing tandem stance holds 10s 2x4 -marches 2x8x3s (w/ PPT) w/ band Glute bridges  Calf raises   ASSESSMENT:  CLINICAL IMPRESSION:  Patient tolerated treatment with no increases in pain with progressions in BL hip and LE loading, SLS and DLS balancing via static and dynamic exercises. Current deficits include: BL hip, LE, and core strength alongside general static and dynamic balance. As a result, patient would continue to benefit from skilled PT to address said deficits via plan below.   EVAL: Patient is a 83 year old female who presents with unsteady gait. Patient presents with deficits in: balance, BL LE, core, and hip strength. As a result, the patient would benefit from skilled PT to address aforementioned deficits via plan below.   OBJECTIVE IMPAIRMENTS: decreased balance, decreased strength, postural dysfunction, and obesity.   ACTIVITY LIMITATIONS: bending, squatting, stairs, and  reach over head  PERSONAL FACTORS: Age and 1 comorbidity:   are also affecting patient's functional outcome.   REHAB POTENTIAL: Fair    CLINICAL DECISION MAKING: Stable/uncomplicated  EVALUATION COMPLEXITY: Moderate   GOALS: Goals reviewed with patient? No  SHORT TERM GOALS: Target date: 08/31/2024   Patient will demonstrate 75% HEP compliance  to show independence with self-management of condition  Baseline: 0% Goal status: MET  2.  Patient will be able to report overall perceived balance at least 50%% capacity to demonstrate improvements in lower body stability, ADL completion, and overall QOL  Baseline: 25% Goal status: INITIAL    LONG TERM GOALS: Target date: 10/10/24  Patient will demonstrate 100% HEP compliance to show independence with self-management of condition  Baseline: 0% Goal status: MET  2.  Patient will demonstrate a 9 point improvement in PSFS to show improvements in ADL completion and overall QOL   Baseline: 13 Goal status: INITIAL  3.  Patient will be able to report overall perceived balance at least 90% capacity to demonstrate improvements in lower body stability, ADL completion, and overall QOL  Baseline: 25% Goal status: INITIAL    PLAN:  PT FREQUENCY: 1-2x/week  PT DURATION: 8 weeks  PLANNED INTERVENTIONS: 97110-Therapeutic exercises, 97530- Therapeutic activity, 97112- Neuromuscular re-education, 97535- Self Care, 02859- Manual therapy, [phone]- Gait training, Patient/Family education, Balance training, and Stair training  PLAN FOR NEXT SESSION: HEP assessment and progression, symptom modulation, and loading (isolated and/or functional). Manual therapy, aerobic, gait, and NME training as needed. Discharge next visit possibly.    Washington Greener Javon Snee  PT, DPT  09/28/2024, 9:29 PM     "

## 2024-09-29 DIAGNOSIS — L853 Xerosis cutis: Secondary | ICD-10-CM | POA: Diagnosis not present

## 2024-09-29 DIAGNOSIS — Z85820 Personal history of malignant melanoma of skin: Secondary | ICD-10-CM | POA: Diagnosis not present

## 2024-09-29 DIAGNOSIS — D2261 Melanocytic nevi of right upper limb, including shoulder: Secondary | ICD-10-CM | POA: Diagnosis not present

## 2024-09-29 DIAGNOSIS — L820 Inflamed seborrheic keratosis: Secondary | ICD-10-CM | POA: Diagnosis not present

## 2024-09-29 DIAGNOSIS — Z85828 Personal history of other malignant neoplasm of skin: Secondary | ICD-10-CM | POA: Diagnosis not present

## 2024-09-29 DIAGNOSIS — L814 Other melanin hyperpigmentation: Secondary | ICD-10-CM | POA: Diagnosis not present

## 2024-09-29 DIAGNOSIS — L821 Other seborrheic keratosis: Secondary | ICD-10-CM | POA: Diagnosis not present

## 2024-09-29 DIAGNOSIS — L720 Epidermal cyst: Secondary | ICD-10-CM | POA: Diagnosis not present

## 2024-10-06 ENCOUNTER — Ambulatory Visit

## 2025-03-30 ENCOUNTER — Ambulatory Visit: Admitting: Cardiology
